# Patient Record
Sex: Male | Born: 1935 | Race: White | Hispanic: No | Marital: Married | Smoking: Former smoker
Health system: Southern US, Community
[De-identification: ages and names within clinical notes are randomized; demographics above are authoritative.]

## PROBLEM LIST (undated history)

## (undated) DIAGNOSIS — Z87442 Personal history of urinary calculi: Secondary | ICD-10-CM

## (undated) DIAGNOSIS — I499 Cardiac arrhythmia, unspecified: Secondary | ICD-10-CM

## (undated) DIAGNOSIS — M169 Osteoarthritis of hip, unspecified: Secondary | ICD-10-CM

## (undated) DIAGNOSIS — H3530 Unspecified macular degeneration: Secondary | ICD-10-CM

## (undated) DIAGNOSIS — I714 Abdominal aortic aneurysm, without rupture, unspecified: Secondary | ICD-10-CM

## (undated) DIAGNOSIS — I739 Peripheral vascular disease, unspecified: Secondary | ICD-10-CM

## (undated) DIAGNOSIS — K635 Polyp of colon: Secondary | ICD-10-CM

## (undated) DIAGNOSIS — I10 Essential (primary) hypertension: Secondary | ICD-10-CM

## (undated) DIAGNOSIS — I729 Aneurysm of unspecified site: Secondary | ICD-10-CM

## (undated) DIAGNOSIS — IMO0002 Reserved for concepts with insufficient information to code with codable children: Secondary | ICD-10-CM

## (undated) DIAGNOSIS — E785 Hyperlipidemia, unspecified: Secondary | ICD-10-CM

## (undated) DIAGNOSIS — M359 Systemic involvement of connective tissue, unspecified: Secondary | ICD-10-CM

## (undated) HISTORY — DX: Personal history of urinary calculi: Z87.442

## (undated) HISTORY — DX: Aneurysm of unspecified site: I72.9

## (undated) HISTORY — DX: Systemic involvement of connective tissue, unspecified: M35.9

## (undated) HISTORY — PX: ABDOMINAL AORTIC ANEURYSM REPAIR: SUR1152

## (undated) HISTORY — DX: Essential (primary) hypertension: I10

## (undated) HISTORY — DX: Unspecified macular degeneration: H35.30

## (undated) HISTORY — DX: Hyperlipidemia, unspecified: E78.5

## (undated) HISTORY — DX: Reserved for concepts with insufficient information to code with codable children: IMO0002

## (undated) HISTORY — DX: Osteoarthritis of hip, unspecified: M16.9

## (undated) HISTORY — PX: OTHER SURGICAL HISTORY: SHX169

## (undated) HISTORY — DX: Abdominal aortic aneurysm, without rupture, unspecified: I71.40

## (undated) HISTORY — DX: Abdominal aortic aneurysm, without rupture: I71.4

## (undated) HISTORY — DX: Polyp of colon: K63.5

---

## 1996-02-22 HISTORY — PX: HERNIA REPAIR: SHX51

## 2004-02-22 HISTORY — PX: DOPPLER ECHOCARDIOGRAPHY: SHX263

## 2004-10-13 ENCOUNTER — Ambulatory Visit: Payer: Self-pay | Admitting: Ophthalmology

## 2004-10-28 ENCOUNTER — Ambulatory Visit: Payer: Self-pay | Admitting: Gastroenterology

## 2005-02-09 ENCOUNTER — Ambulatory Visit: Payer: Self-pay | Admitting: Internal Medicine

## 2006-03-07 ENCOUNTER — Ambulatory Visit: Payer: Self-pay | Admitting: Internal Medicine

## 2008-04-24 ENCOUNTER — Ambulatory Visit: Payer: Self-pay | Admitting: Cardiology

## 2008-05-06 ENCOUNTER — Encounter: Payer: Self-pay | Admitting: Cardiology

## 2008-05-06 ENCOUNTER — Ambulatory Visit: Payer: Self-pay

## 2008-05-14 ENCOUNTER — Ambulatory Visit: Payer: Self-pay | Admitting: Cardiology

## 2008-05-14 ENCOUNTER — Encounter (INDEPENDENT_AMBULATORY_CARE_PROVIDER_SITE_OTHER): Payer: Self-pay | Admitting: *Deleted

## 2008-12-16 ENCOUNTER — Telehealth: Payer: Self-pay | Admitting: Cardiology

## 2009-02-23 ENCOUNTER — Telehealth: Payer: Self-pay | Admitting: Cardiology

## 2009-04-16 ENCOUNTER — Ambulatory Visit: Payer: Self-pay | Admitting: Ophthalmology

## 2009-04-29 ENCOUNTER — Ambulatory Visit: Payer: Self-pay | Admitting: Ophthalmology

## 2009-09-02 ENCOUNTER — Encounter: Payer: Self-pay | Admitting: Cardiovascular Disease

## 2009-09-03 ENCOUNTER — Ambulatory Visit: Payer: Self-pay | Admitting: Cardiovascular Disease

## 2009-09-03 DIAGNOSIS — E785 Hyperlipidemia, unspecified: Secondary | ICD-10-CM | POA: Insufficient documentation

## 2009-09-03 DIAGNOSIS — I4949 Other premature depolarization: Secondary | ICD-10-CM

## 2009-10-23 ENCOUNTER — Telehealth: Payer: Self-pay | Admitting: Cardiovascular Disease

## 2010-01-28 ENCOUNTER — Ambulatory Visit: Payer: Self-pay | Admitting: Unknown Physician Specialty

## 2010-01-29 LAB — PATHOLOGY REPORT

## 2010-03-23 NOTE — Assessment & Plan Note (Signed)
Summary: ROV  Medications Added BYSTOLIC 5 MG TABS (NEBIVOLOL HCL) 1 tab once daily BYSTOLIC 5 MG TABS (NEBIVOLOL HCL) 1/2 tab once daily METOPROLOL TARTRATE 25 MG TABS (METOPROLOL TARTRATE) Take 1/2 -1 tablet by mouth twice a day      Allergies Added: ! ZOCOR ! * IVP DYE  Visit Type:  rov Primary Provider:  Einar Crow.[facility]  CC:  edema/ankles...no other complaints today.  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  he was on bystolic 2.5 mg daily no had some temporary relief with his PVCs but did develop ectopy again. Dr. Dareen Piano increased his medication 25 mg a day and he reports having some hypotension and dizziness particularly when bending over. One day when he was playing golf he reports having systolic pressures in the high 80s to 90s.  Holter monitor, which showed frequent PVCs of ventricular couplets and trigeminy, bigeminy.  There were no runs of nonsustained ventricular tachycardia, 7.9% of the beats were wide complex.   echocardiogram to ensure that his heart was structurally normal.  This showed normal LV systolic function, mild diastolic dysfunction, mild LVH, and no significant valvular disease. The pulmonary artery systolic pressure was mildly elevated at 40 mmHg.  We additionally did an exercise treadmill Myoview to see if frequent PVCs could be a manifestation of ischemia.  The patient did have excellent exercise tolerance, stopped due to fatigue with no chest pain.    Current Medications (verified): 1)  Lipitor 40 Mg Tabs (Atorvastatin Calcium) .Marland Kitchen.. 1 By Mouth Daily 2)  Icaps Areds Formula  Tabs (Multiple Vitamins-Minerals) .Marland Kitchen.. 1 By Mouth Twice Daily 3)  Aspirin 81 Mg Tbec (Aspirin) .... Take One Tablet By Mouth Daily 4)  Centrum  Tabs (Multiple Vitamins-Minerals) .Marland Kitchen.. 1 By Mouth Daily 5)   Bystolic 5 Mg Tabs (Nebivolol Hcl) .Marland Kitchen.. 1 Tab Once Daily  Allergies (verified): 1)  ! Zocor 2)  ! * Ivp Dye  Past History:  Past Surgical History: Last updated: 05/14/2008 AAA Lung Hamartoma 2 Hernia's  Review of Systems  The patient denies fever, weight loss, weight gain, vision loss, decreased hearing, hoarseness, chest pain, syncope, dyspnea on exertion, peripheral edema, prolonged cough, abdominal pain, incontinence, muscle weakness, depression, and enlarged lymph nodes.         Dizzy, hypotension on medication  Vital Signs:  Patient profile:   77 year old male Height:      68 inches Weight:      163 pounds BMI:     24.87 Pulse rate:   57 / minute Pulse rhythm:   regular BP sitting:   144 / 82  (left arm) Cuff size:   large  Vitals Entered By: Danielle Rankin, CMA (September 03, 2009 10:57 AM)  Physical Exam  General:  Well developed, well nourished, in no acute distress. Head:  normocephalic and atraumatic Neck:  Neck supple, no JVD. No masses, thyromegaly or abnormal cervical nodes. Lungs:  Clear bilaterally to auscultation and percussion. Heart:  Non-displaced PMI, chest non-tender; regular rate and rhythm, S1, S2 without murmurs, rubs or gallops. Carotid upstroke normal, no bruit.  Pedals normal pulses. No edema, no varicosities. Abdomen:  Bowel sounds positive; abdomen soft and non-tender without masses Msk:  Back normal, normal gait. Muscle strength and tone normal. Pulses:  pulses normal in all 4 extremities Extremities:  No clubbing or cyanosis. Neurologic:  Alert and oriented x 3. Skin:  Intact without lesions or rashes. Psych:  Normal affect.   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 He had hypotension on bystolic 5 mg daily. We have suggested he could take beta blockers p.r.n. for his symptoms. He can also try metoprolol tartrate 12.5 mg b.i.d. p.r.n. for symptoms, Instead of bystolic 5 mg  daily.  He does not have any Raynaud's symptoms during the summertime and now may be a good time to try other beta blockers p.r.n.  His updated medication list for this problem includes:    Aspirin 81 Mg Tbec (Aspirin) .Marland Kitchen... Take one tablet by mouth daily    Bystolic 5 Mg Tabs (Nebivolol hcl) .Marland Kitchen... 1/2 tab once daily    Metoprolol Tartrate 25 Mg Tabs (Metoprolol tartrate) .Marland Kitchen... Take 1/2 -1 tablet by mouth twice a day  Problem # 2:  HYPERLIPIDEMIA-MIXED (ICD-272.4) he is on aspirin and Lipitor. He reports having vision problems in his eye in the past thought secondary to plaque embolism. Cholesterol is managed by Dr. Einar Crow.  His updated medication list for this problem includes:    Lipitor 40 Mg Tabs (Atorvastatin calcium) .Marland Kitchen... 1 by mouth daily  Patient Instructions: 1)  Your physician recommends that you schedule a follow-up appointment as needed  2)  Your physician has recommended you make the following change in your medication: decrease Bystolic to 1/2 tab daily and start Metoprolol 25mg  1/2 -1 tablet two times a day  Prescriptions: METOPROLOL TARTRATE 25 MG TABS (METOPROLOL TARTRATE) Take 1/2 -1 tablet by mouth twice a day  #60 x 6   Entered by:   Hardin Negus, RMA   Authorized by:   Dossie Arbour MD   Signed by:   Hardin Negus, RMA on 09/03/2009   Method used:   Electronically to        [facility] [phone]* (retail)       [address] [place], [place]  [address]       Ph: [phone] or [phone]       Fax: [phone]   RxID:   [id number]

## 2010-03-23 NOTE — Progress Notes (Signed)
Summary: MEDICATION PROBLEMS   Phone Note Call from Patient Call back at Home Phone [phone] Call back at cell [phone]   Caller: SELF Call For: GOLLAN Summary of Call: BP IS RUNNING TOO LOW-LOTS OF DIZZINESS, ESPECIALLY WHEN PLAYING GOLF-PT WAS STARTED ON BYSTOLLIC 1/2 OF A TABLET PER DAY-PT IS TAKING METOPROLOL 25MG  1/2 TABLET 2X A DAY  Initial call taken by: Harlon Flor,  October 23, 2009 3:27 PM  Follow-up for Phone Call        Pt reports being dizzy bp's 11/67,91/51,89/52, 83/50, 103/54, 98/51, 102/48. Taking metoprolol 12.5 two times a day,  bystolic 2.5mg  daily.  Follow-up by: Benedict Needy, RN,  October 23, 2009 3:43 PM  Additional Follow-up for Phone Call Additional follow up Details #1::        Per Dr. Gala Romney stop bystolic

## 2010-03-23 NOTE — Progress Notes (Signed)
Summary: RX  Medications Added BYSTOLIC 2.5 MG TABS (NEBIVOLOL HCL) 1 by mouth once daily       Phone Note Refill Request Call back at Home Phone [phone] Message from:  SELF on February 23, 2009 9:56 AM  [facility]STOLIC 2.[facility] [address]  Initial call taken by: Harlon Flor,  February 23, 2009 9:57 AM    New/Updated Medications: BYSTOLIC 2.5 MG TABS (NEBIVOLOL HCL) 1 by mouth once daily Prescriptions: BYSTOLIC 2.5 MG TABS (NEBIVOLOL HCL) 1 by mouth once daily  #30 x 6   Entered by:   Mercer Pod   Authorized by:   Marca Ancona, MD   Signed by:   Mercer Pod on 02/23/2009   Method used:   [facility]ronically to        [facility] [phone][facility] (retail)       [address] [place], [place]  [address]       Ph: [phone] or [phone]       Fax: [phone]   RxID:   [id number]

## 2010-03-23 NOTE — Letter (Signed)
Summary: Historic Patient File  Historic Patient File   Imported By: [place] Carbo 09/03/2009 14:27:37  _____________________________________________________________________  External Attachment:    Type:   Image     Comment:   External Document

## 2010-07-06 NOTE — Assessment & Plan Note (Signed)
[facility] OFFICE NOTE   Jerry [name], Jerry [name]                       MRN:          [id number]  DATE:05/14/2008                            DOB:          28-Dec-1928    PRIMARY CARE PHYSICIAN:  Dr. Einar Crow at [facility].   RHEUMATOLOGIST:  Dr. Lavenia Atlas at [facility].   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  The  patient states that for 3 weeks prior to his initial appointment, he had  felt significant fluttering and palpitations in his chest.  He noticed  these most often when he is at rest and they tend to resolve with  activity.  He had had no episodes of lightheadedness or syncope.  He had  a Holter monitor, which showed frequent PVCs of ventricular couplets and  trigeminy, bigeminy.  There were no runs of nonsustained ventricular  tachycardia, 7.9% of the beats were wide complex.  Given the frequent  ventricular ectopy, we did do an echocardiogram to ensure that his heart  was structurally normal.  This showed normal LV systolic function, mild  diastolic dysfunction, mild LVH, and no significant valvular disease.  The pulmonary artery systolic pressure was mildly elevated at 40 mmHg.  We additionally did an exercise treadmill Myoview to see if frequent  PVCs could be a manifestation of ischemia.  The patient did have  excellent exercise tolerance, stopped due to fatigue with no chest pain.  He had occasional PVCs that were more frequent in recovery.  He actually  also had an episode of transient ectopic atrial tachycardia while on the  treadmill.  This was very brief.  The stress test itself was negative  with no evidence of ischemia or infarction.   Since last time I saw him,  the patient does state that his palpitations seem to have decreased in  frequency.  He says he has been less anxious about the palpitations, and  that may have helped.  He has not been drinking any caffeine.  His heart  rate on exam today is regular.   PAST MEDICAL HISTORY:  1. Hyperlipidemia.  2. Hypertension.  3. Paget disease.  4. History of nephrolithiasis.  5. Osteoarthritis of left hip.  6. Colonic polyps.  7. Collagen vascular disorder, which does sound like a mixed      connective tissue disease.  This is manifested by elevated sed      rate, pleuritis, Raynaud phenomenon, and he does have a positive      rheumatoid factor.  He is being treated with hydroxychloroquine for      his collagen vascular disease.  8. Macular degeneration.  9. History of multiple vascular aneurysms.  These were of the aorta,      the iliac arteries, and popliteal arteries.  The patient is status      post stent graft with an abdominal aortic  aneurysm.  All of his      aneurysm followup has been done at [facility].  10.Hernia repair in 1998.  11.Echocardiogram done in March 2010, EF 55-60%, no regional wall      motion abnormalities, mild LVH, mild diastolic dysfunction.  The      aortic valve was calcified, but there is no aortic stenosis or      aortic insufficiency, mild left atrial enlargement, mild MR, mild      TR, pulmonary systolic pressure 40 mmHg.  12.Exercise treadmill Myoview.  The patient did exercise for 10      minutes and 10 seconds.  He stopped due to fatigue.  There is no      chest pain.  There were occasional PVCs that were more frequent in      recovery.  There was a brief episode of transient ectopic atrial      tachycardia.  There was no evidence for ischemia or infarction on      the perfusion images with an EF of 63%.  13.Holter monitor done at the [facility] March 2010 showed      frequent PVCs with ventricular couplets and trigeminy,  bigeminy.      There was no ventricular tachycardia, 7.9% of the beats were wide      complex.   Most recent labs we have available from September 2009, LDL was 93, HDL  51, creatinine 0.8.   MEDICATIONS:  1. Aspirin 81 mg daily.  2. Hydroxychloroquine.  3. Norvasc 2.5 mg b.i.d.  4. Lipitor 40 mg daily.   ALLERGIES:  CONTRAST DYE and ZOCOR, which gives the patient myalgias.   PHYSICAL EXAMINATION:  VITAL SIGNS:  Blood pressure is 142/82, heart  rate 70 and regular.  GENERAL:  This is a well-developed male in no apparent distress.  NEUROLOGIC:  Alert and oriented x3.  Normal affect.  LUNGS:  Clear to auscultation bilaterally with normal respiratory  effort.  CARDIOVASCULAR:  Heart regular.  S1 and S2.  No S3.  There is a soft S4.  There is no murmur.  There is no peripheral edema.  There are 2+  posterior tibial pulses bilaterally.  There is no carotid bruit.  I did  detect any premature beats today on exam.  ABDOMEN:  Soft, nontender.  No hepatosplenomegaly.  Normal bowel sounds.  EXTREMITIES:  No clubbing or cyanosis.  NECK:  There is no JVD.  There is no thyromegaly or thyroid nodule.   ASSESSMENT/PLAN:  This is a 77 year old with a history of hypertension,  hyperlipidemia, and vascular aneurysms as well as probable mixed  connective tissue disease who presents to Cardiology Clinic for  evaluation of frequent premature ventricular contractions.  1. Premature ventricular contractions.  On this visit, the patient is      having significantly less frequent PVCs on exam.  His      echocardiogram showed an essentially structurally normal heart and      his Myoview showed no evidence for ischemia or infarction.  As      mentioned, his Holter monitor from earlier in the month showed      about 7.9% of his beats were ventricular in origin.  His ectopic      beats did have a left bundle-branch block, inferior axis morphology      suggesting origin around the RV outflow tract.  The  patient also      had a normal TSH per his report done at Dr. Ewell Poe office.  We      do not have any immediate explanation for the onset of his PVCs.      He has not been using any sympathomimetic drugs.  He is completely      off caffeine and he actually has no cardiopulmonary-type symptoms.      I think at this time we will continue to manage him conservatively      especially as his premature beats seem to become significantly less      frequent.  I did tell him that we would try him on nebivolol at 5      mg daily to see if this can help suppress his residual PVCs.  There      is some evidence that very frequent ventricular ectopy can lead to      development of LV systolic dysfunction and congestive heart      failure; however, I do not think that the patient's ectopic burden      of 7.9% would be enough to cause cardiomyopathy, so if he is unable      to tolerate nebivolol, I do not think it is an absolutely necessary      medication.  I did choose nebivolol because of its beta-1      selectivity at a dose of less than 10 mg/day.  Given the fact that      he has Raynauds syndrome, we do want to avoid beta-2 blocking      effect.  I did tell the patient that if he does note his Raynaud's      symptoms becoming worse then he should stop the nebivolol.  He is      on Norvasc 2.5 mg twice a day presumably for his Raynauds syndrome      as well.  2. Hypertension.  The patient's blood pressure is 142/82 today.  It      does continue to be elevated; however, he does bring me readings      from his blood pressure cuff at home, which have ranged from      systolic of 119-134 over diastolic 62-75.  He does say that the      blood pressure cuff has been calibrated at his wife's primary care      physician's office, so I do think that there may be a white coat      component to his hypertension.  As mentioned, we will add nebivolol      to his regimen.  This should help lower his blood  pressure a bit.  3. I will have the patient back in 6 months to see how he is doing on      medication and additionally, given the calcification of his aortic      valve with a mild restriction of valve opening, it probably will be      a good idea to get an echocardiogram in about 2 years.     Marca Ancona, MD  Electronically Signed    DM/MedQ  DD: 05/14/2008  DT: 05/14/2008  Job #: [id number]   cc:   Einar Crow, MD

## 2010-07-06 NOTE — Assessment & Plan Note (Signed)
[facility] OFFICE NOTE   JAKOB, KIMBERLIN                       MRN:          [id number]  DATE:04/24/2008                            DOB:          1929/02/14    PRIMARY CARE PHYSICIAN:  Dr.  Marya Amsler. [place].   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  The patient states that for the last 3 weeks he has  felt fluttering and palpitations in his chest.  He says he notes these  the most when he is at rest, especially when he is lying down in bed at  night.  When he is more active, he does not notice them as much.  He has  had no episodes of lightheadedness or syncope.  He was seen by Dr.  Dareen Piano who noted frequent PVCs on EKG.  Holter monitor was ordered.  The Holter monitor shows frequent PVCs with ventricular couplets and  trigeminy and bigeminy.  There were no runs of ventricular tachycardia  or nonsustained ventricular tachycardia.  7.9% of the beats recorded  were wide complex.  The patient states that prior to 3 weeks ago he  really had not been having these symptoms.  He used to drink a cup or  two of coffee a day; however, for the last week, he has been drinking no  caffeine at all.  He has not been using any over-the-counter cold  remedies.  He does have baseline excellent exercise tolerance.  He walks  for exercise.  He plays golf and will walk 18 holes.  He gets no  shortness of breath with these activities.  He does get mildly short of  breath if he climbs up a hill.  He can climb a flight of steps without  problems.  The patient does not ever get episodes of chest pain or  tightness.  He has no known cardiac history.   PAST MEDICAL HISTORY:  1. Hyperlipidemia.  2. Hypertension.  3. Paget's disease.  4. History of nephrolithiasis.  5. Osteoarthritis in the left hip.  6. Colonic polyps.  7. Collagen vascular disorder, which does sound like a mixed      connective tissue disease.  This is manifested and is elevated, sed      rate, pleuritis, Raynaud's phenomenon and she does have a positive      rheumatoid factor.  He is being treated with Plaquenil for his      collagen vascular disease.  8. Macular degeneration.  9. History of multiple vascular aneurysms.  These involve the aorta,      the iliac arteries, and the popliteal arteries.  The patient is      status post stent graft of an abdominal aortic aneurysm.  All of      his aneurysm followup has been done at [facility].  10.Hernia repair in 1998.  11.Echocardiogram in 2006, showed EF greater than 60%.  There are  no      regional wall motion abnormalities.  There was mild mitral      regurgitation, there is no aortic stenosis, aortic valve was mildly      calcified.   SOCIAL HISTORY:  The patient quit smoking more than 40 years ago.  He is  retired, married, lives in [place].  He has three children and  grandchildren, rarely drinks alcohol.   FAMILY HISTORY:  Mother died of an MI at the age of 41, father had  Alzheimer's disease.   REVIEW OF SYSTEMS:  Negative except as noted in the history of present  illness.   EKG shows normal sinus rhythm with ventricular bigeminy.  The  ventricular beats have a left bundle/inferior axis morphology suggesting  origin from the area of the RV outflow tract.   MEDICATIONS:  1. Aspirin 81 mg daily.  2. Plaquenil 200 mg b.i.d.  3. Lipitor 40 mg daily.  4. Meloxicam p.r.n.  5. Norvasc 2.5 mg b.i.d.   ALLERGIES:  CONTRAST DYE and ZOCOR, which gives the patient myalgias.   PHYSICAL EXAMINATION:  VITAL SIGNS:  Blood pressure is 161/76, heart  rate is 87 and regular.  Weight is 163 pounds.  GENERAL:  This is a well-developed elderly male in no apparent distress.  NEUROLOGIC:  Alert and  oriented x3.  Normal affect.  LUNGS:  Clear to auscultation bilaterally.  Normal respiratory effort.  CARDIOVASCULAR:  Heart regular.  S1 and S2.  No S3.  There was a soft  S4.  There is no murmur.  There is no peripheral edema.  There are 2+  posterior tibial pulses bilaterally.  There is no carotid bruit.  ABDOMEN:  Soft, nontender.  No hepatosplenomegaly.  Normal bowel sounds.  EXTREMITIES:  No clubbing, cyanosis.  SKIN:  Normal exam.  MUSCULOSKELETAL:  Normal exam.  HEENT:  Normal exam.  NECK:  There is no JVD.  There is no thyromegaly or thyroid nodule.   ASSESSMENT/PLAN:  This is a 77 year old with history of hypertension,  hyperlipidemia, and cardiovascular disease who presents to the  Cardiology Clinic for evaluation of very frequent premature ventricular  contractions.  1. Premature ventricular contractions.  The patient initial EKG upon      arrival in our office showed ventricular bigeminy.  Interestingly,      when I examined him about 15 minutes later, he was no longer having      ventricular premature beats and his heart rhythm was actually      regular.  His Holter monitor was reviewed, this showed no runs of      ventricular tachycardia or nonsustained ventricular tachycardia but      did show ventricular bigeminy and trigeminy.  Wide complex beats      composed about 7.9% of the recorded beats.  His ectopic ventricular      beats do have a left bundle-branch block, inferior axis morphology      suggesting origin from the area of the RV outflow tract.  The      patient does not have any immediate explanation for the onset of      these PVCs.  The patient has not been using any sympathomimetic      drugs.  Prior to this, he really has had almost no cardiopulmonary      type symptoms.  I do think that for further workup we will need to      obtain an echocardiogram to assess the structure and function of  the heart.  I would also suggest that we do an exercise  treadmill      Myoview because the PVCs could be a manifestation of ischemia and      because I will be interested to see if the PVCs change in frequency      with exercise on the treadmill.  We will obtain his labs from the      [facility] to make sure that he is not hypokalemic or      hypomagnesemic.  If the patient's echo and stress test come back      benign, I would consider use of a beta-blocker to attempt      suppression of his PVCs.  There is some evidence that very frequent      ventricular ectopy can lead to the development of left ventricular      systolic dysfunction and congestive heart failure.  However, I do      not think that the patient's ectopic burden of 7.9% of the total      beats would be enough to cause a cardiomyopathy.  Of note, if we do      use a beta-blocker to suppress his ventricular ectopy, we will need      to consider the fact that he has Raynaud syndrome and a beta      blocker could worsen this.  We should probably increase his      Norvasc, therefore, if we start a beta blocker.  2. Hypertension.  The patient's blood pressure is elevated today,      161/76, and was elevated 150/68 at his primary care physician's      appointment.  I did mention to the patient that it would probably      be a good idea to increase his blood pressure medicines to improve      control, but he wants to hold off on this for now.  He says he is      going to check his blood pressure on his home cuff and record it      for [place] to go over the next time he comes back in.  After his workup      of PVCs, if we do indeed begin on a beta-blocker, given his history      of Raynaud's, I would probably plan on titrating up his Norvasc to      5 mg twice a day.     Marca Ancona, MD  Electronically Signed    DM/MedQ  DD: 04/24/2008  DT: 04/25/2008  Job #: [id number]   cc:   Einar Crow

## 2010-07-30 ENCOUNTER — Encounter: Payer: Self-pay | Admitting: Cardiovascular Disease

## 2010-09-29 ENCOUNTER — Telehealth: Payer: Self-pay

## 2010-09-29 MED ORDER — METOPROLOL TARTRATE 25 MG PO TABS: 25.0000 mg | ORAL_TABLET | Freq: Two times a day (BID) | ORAL | Status: DC

## 2010-09-29 NOTE — Telephone Encounter (Signed)
Needs a refill for metoprolol tart 25 mg take one tablet twice a day.

## 2011-11-27 ENCOUNTER — Other Ambulatory Visit: Payer: Self-pay | Admitting: Cardiovascular Disease

## 2012-08-01 DIAGNOSIS — N200 Calculus of kidney: Secondary | ICD-10-CM | POA: Insufficient documentation

## 2012-08-01 DIAGNOSIS — Z87891 Personal history of nicotine dependence: Secondary | ICD-10-CM | POA: Insufficient documentation

## 2012-08-03 DIAGNOSIS — I724 Aneurysm of artery of lower extremity: Secondary | ICD-10-CM | POA: Insufficient documentation

## 2014-05-12 DIAGNOSIS — I10 Essential (primary) hypertension: Secondary | ICD-10-CM | POA: Insufficient documentation

## 2016-02-05 DIAGNOSIS — Z8739 Personal history of other diseases of the musculoskeletal system and connective tissue: Secondary | ICD-10-CM | POA: Insufficient documentation

## 2016-05-18 DIAGNOSIS — Z0000 Encounter for general adult medical examination without abnormal findings: Secondary | ICD-10-CM | POA: Insufficient documentation

## 2017-05-03 ENCOUNTER — Emergency Department
Admission: EM | Admit: 2017-05-03 | Discharge: 2017-05-04 | Disposition: A | Discharge status: Home / Self Care | Payer: Medicare Other | Attending: Emergency Medicine | Admitting: Emergency Medicine

## 2017-05-03 ENCOUNTER — Emergency Department: Payer: Medicare Other

## 2017-05-03 DIAGNOSIS — Z7982 Long term (current) use of aspirin: Secondary | ICD-10-CM | POA: Diagnosis not present

## 2017-05-03 DIAGNOSIS — Z8679 Personal history of other diseases of the circulatory system: Secondary | ICD-10-CM | POA: Insufficient documentation

## 2017-05-03 DIAGNOSIS — Z87891 Personal history of nicotine dependence: Secondary | ICD-10-CM | POA: Insufficient documentation

## 2017-05-03 DIAGNOSIS — I10 Essential (primary) hypertension: Secondary | ICD-10-CM | POA: Diagnosis not present

## 2017-05-03 DIAGNOSIS — Z79899 Other long term (current) drug therapy: Secondary | ICD-10-CM | POA: Diagnosis not present

## 2017-05-03 DIAGNOSIS — R066 Hiccough: Secondary | ICD-10-CM | POA: Diagnosis present

## 2017-05-03 LAB — CBC WITH DIFFERENTIAL/PLATELET
BASOS ABS: 0 10*3/uL (ref 0–0.1)
BASOS PCT: 0 %
EOS ABS: 0.1 10*3/uL (ref 0–0.7)
EOS PCT: 1 %
HCT: 46.1 % (ref 40.0–52.0)
Hemoglobin: 15.3 g/dL (ref 13.0–18.0)
LYMPHS PCT: 7 %
Lymphs Abs: 0.6 10*3/uL — ABNORMAL LOW (ref 1.0–3.6)
MCH: 30.8 pg (ref 26.0–34.0)
MCHC: 33.1 g/dL (ref 32.0–36.0)
MCV: 92.9 fL (ref 80.0–100.0)
Monocytes Absolute: 0.9 10*3/uL (ref 0.2–1.0)
Monocytes Relative: 11 %
Neutro Abs: 6.9 10*3/uL — ABNORMAL HIGH (ref 1.4–6.5)
Neutrophils Relative %: 81 %
PLATELETS: 200 10*3/uL (ref 150–440)
RBC: 4.96 MIL/uL (ref 4.40–5.90)
RDW: 12.7 % (ref 11.5–14.5)
WBC: 8.4 10*3/uL (ref 3.8–10.6)

## 2017-05-03 LAB — COMPREHENSIVE METABOLIC PANEL
ALT: 20 U/L (ref 17–63)
AST: 33 U/L (ref 15–41)
Albumin: 3.6 g/dL (ref 3.5–5.0)
Alkaline Phosphatase: 93 U/L (ref 38–126)
Anion gap: 10 (ref 5–15)
BUN: 23 mg/dL — AB (ref 6–20)
CHLORIDE: 101 mmol/L (ref 101–111)
CO2: 26 mmol/L (ref 22–32)
CREATININE: 0.96 mg/dL (ref 0.61–1.24)
Calcium: 8.1 mg/dL — ABNORMAL LOW (ref 8.9–10.3)
GFR calc Af Amer: >60 mL/min (ref >60)
GFR calc non Af Amer: >60 mL/min (ref >60)
Glucose, Bld: 141 mg/dL — ABNORMAL HIGH (ref 65–99)
Potassium: 3.6 mmol/L (ref 3.5–5.1)
SODIUM: 137 mmol/L (ref 135–145)
Total Bilirubin: 0.8 mg/dL (ref 0.3–1.2)
Total Protein: 6.8 g/dL (ref 6.5–8.1)

## 2017-05-03 LAB — TROPONIN I: Troponin I: <0.03 ng/mL (ref <0.03)

## 2017-05-03 NOTE — ED Notes (Signed)
Pt sitting in lobby with no distress noted, no c/o pain; hiccups persist; vs retaken and updated on wait time

## 2017-05-03 NOTE — ED Triage Notes (Signed)
Patient reports he was diagnosed influenza [facility]. Patient was prescribed and is taking tamiflu.  Patient c/o hiccups.

## 2017-05-03 NOTE — ED Notes (Signed)
Patient reports medial/left chest discomfort, however, reports he believes it's due to hiccups

## 2017-05-04 DIAGNOSIS — R066 Hiccough: Secondary | ICD-10-CM | POA: Diagnosis not present

## 2017-05-04 LAB — INFLUENZA PANEL BY PCR (TYPE A & B)
Influenza A By PCR: NEGATIVE
Influenza B By PCR: NEGATIVE

## 2017-05-04 MED ORDER — CHLORPROMAZINE HCL 25 MG PO TABS
25.0000 mg | ORAL_TABLET | Freq: Once | ORAL | Status: AC
  Administered 2017-05-04: 25 mg via ORAL
  Filled 2017-05-04: qty 1

## 2017-05-04 MED ORDER — AZITHROMYCIN 500 MG PO TABS: 500.0000 mg | ORAL_TABLET | Freq: Every day | ORAL | 0 refills | Status: AC

## 2017-05-04 MED ORDER — CHLORPROMAZINE HCL 25 MG PO TABS: 25.0000 mg | ORAL_TABLET | Freq: Three times a day (TID) | ORAL | 0 refills | Status: DC | PRN

## 2017-05-04 NOTE — ED Notes (Signed)
Patient has been hiccupping since yesterday morning so over 40 hours.

## 2017-05-04 NOTE — ED Provider Notes (Signed)
[name] Emergency Department Provider Note __   First MD Initiated Contact with Patient 05/03/17 2350     (approximate)  I have reviewed the triage vital signs and the nursing notes.   HISTORY  Chief Complaint Hiccups   HPI Jerry [name] is a 84 y.o. male with below list of chronic medical conditions sent to the emergency department secondary to persistent hiccups yesterday morning.  Patient states that [facility] and diagnosed with the flu.  In addition patient admits to recent trip and fall with right chest wall injury.  Patient admits to subjective fevers and chills at home.     Past Medical History:  Diagnosis Date  . AAA (abdominal aortic aneurysm) (HCC)   . Aneurysm (HCC)    History of multiple vascular aneurysms. [name] involve the aorta, te iliac arteries, and the popliteal arteries.  The pt is s/p stent graft of an abdominal aortic aneurysm. All of his aneurysm [facility] up as [facility].  . 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 This is manifested and is elevated, sed rate, pleuritis, Raynaud's pehnomenon and she does have a positive rheumatoid factor.  He is being treated with Plquenil for his collagen vascular disease  . Colonic polyp   . History of nephrolithiasis   . Hyperlipidemia   . Hypertension   . Macular degeneration   . Osteoarthrosis, hip    Left hip  . Paget's disease     Patient Active Problem List   Diagnosis Date Noted  . HYPERLIPIDEMIA-MIXED 09/03/2009  . PREMATURE VENTRICULAR CONTRACTIONS 09/03/2009    Past Surgical History:  Procedure Laterality Date  . ABDOMINAL AORTIC ANEURYSM REPAIR    . DOPPLER ECHOCARDIOGRAPHY  2006   Ef greater than 60%. There are no regional wall motion abnormalities. There was mild mitral regurgitation, there is no aortic stenosis, aortic valve was mildly calcified  . HERNIA REPAIR  1998  . Lung  Hamartoma      Prior to Admission medications   Medication Sig Start Date End Date Taking? Authorizing Provider  aspirin 81 MG EC tablet Take 81 mg by mouth daily.      [provider]  atorvastatin (LIPITOR) 40 MG tablet Take 40 mg by mouth daily.      [provider]  metoprolol tartrate (LOPRESSOR) 25 MG tablet Take 1 tablet (25 mg total) by mouth 2 (two) times daily. 09/29/10   Antonieta IbaGollan, Timothy J, MD  Multiple Vitamins-Minerals (CENTRUM) tablet Take 1 tablet by mouth daily.      [provider]  Multiple Vitamins-Minerals (ICAPS) TABS Take 1 tablet by mouth 2 (two) times daily.      [provider]    Allergies Simvastatin  Family History  Problem Relation Age of Onset  . Heart attack Mother [id number]       MI  . Alzheimer's disease Father     Social History Social History   Tobacco Use  . Smoking status: Former Games developermoker  . Tobacco comment: Quit smoking more than 40 years ago  Substance Use Topics  . Alcohol use: Yes    Comment: Rarely  . Drug use: Not on file    Review of Systems Constitutional: No fever/chills Eyes: No visual changes. ENT: No sore throat. Cardiovascular: Positive for chest pain. Respiratory: Denies shortness of breath. Gastrointestinal: No abdominal pain.  No nausea, no vomiting.  No diarrhea.  No constipation. Genitourinary: Negative for dysuria. Musculoskeletal: Negative for neck pain.  Negative for back pain. Integumentary: Negative for rash. Neurological: Negative for headaches, focal weakness or numbness.   ____________________________________________   PHYSICAL EXAM:  VITAL SIGNS: ED Triage Vitals  Enc Vitals Group     BP 05/03/17 1959 (!) 190/77     Pulse Rate 05/03/17 1959 96     Resp 05/03/17 1959 18     Temp 05/03/17 1959 98.2 F (36.8 C)     Temp Source 05/03/17 1959 Oral     SpO2 05/03/17 1959 99 %     Weight 05/03/17 1959 73.5 kg (162 lb)     Height 05/03/17 1959 1.727 m (5\' 8" )     Head  Circumference --      Peak Flow --      Pain Score 05/03/17 2006 5     Pain Loc --      Pain Edu? --      Excl. in [place]? --     Constitutional: Alert and oriented. Well appearing and in no acute distress.  Actively having hiccups Eyes: Conjunctivae are normal.  Head: Atraumatic. Mouth/Throat: Mucous membranes are moist. Oropharynx non-erythematous. Neck: No stridor.   Cardiovascular: Normal rate, regular rhythm. Good peripheral circulation. Grossly normal heart sounds. Respiratory: Normal respiratory effort.  No retractions. Lungs CTAB. Gastrointestinal: Soft and nontender. No distention.  Musculoskeletal: No lower extremity tenderness nor edema. No gross deformities of extremities. Neurologic:  Normal speech and language. No gross focal neurologic deficits are appreciated.  Skin:  Skin is warm, dry and intact. No rash noted. Psychiatric: Mood and affect are normal. Speech and behavior are normal.  ____________________________________________   LABS (all labs ordered are listed, but only abnormal results are displayed)  Labs Reviewed  CBC WITH DIFFERENTIAL/PLATELET - Abnormal; Notable for the following components:      Result Value   Neutro Abs 6.9 (*)    Lymphs Abs 0.6 (*)    All other components within normal limits  COMPREHENSIVE METABOLIC PANEL - Abnormal; Notable for the following components:   Glucose, Bld 141 (*)    BUN 23 (*)    Calcium 8.1 (*)    All other components within normal limits  TROPONIN I  INFLUENZA PANEL BY PCR (TYPE A & B)   ____________________________________________  EKG  ED ECG REPORT I, Eaton N BROWN, the attending physician, personally viewed and interpreted this ECG.   Date: 05/04/2017  EKG Time: 8:18 PM  Rate: 92  Rhythm: Normal sinus rhythm  Axis: Normal  Intervals: Normal  ST&T Change: None  ____________________________________________  RADIOLOGY I, Dougherty N BROWN, personally viewed and evaluated these images (plain  radiographs) as part of my medical decision making, as well as reviewing the written report by the radiologist.   ED MD interpretation: Linear densities noted in the basilar lung fields  Official radiology report(s): Dg Chest 2 View  Result Date: 05/03/2017 CLINICAL DATA:  Left chest pain EXAM: CHEST - 2 VIEW COMPARISON:  Chest CT 03/07/2006 FINDINGS: Linear densities in both lung bases, scarring or atelectasis. Heart is normal size. No confluent airspace opacities or effusions. No acute bony abnormality. IMPRESSION: Linear densities in both lung bases, scarring or atelectasis. Electronically Signed   By: Charlett Nose M.D.   On: 05/03/2017 21:07     Procedures   ____________________________________________   INITIAL IMPRESSION / ASSESSMENT AND PLAN / ED COURSE  As part of my medical decision making, I reviewed the following data within  the electronic MEDICAL RECORD NUMBER   84 year old male presenting to the emergency department secondary to hiccups.  Concern for possible diaphragmatic irritation and as such chest x-ray was performed which revealed bilateral linear densities possibly atelectasis however given recent chest wall injury concern for evolving pneumonia and as such patient given azithromycin.  Regarding patient's pickup Thorazine was given with improvement.  Patient be prescribed Thorazine for home.    ____________________________________________  FINAL CLINICAL IMPRESSION(S) / ED DIAGNOSES  Final diagnoses:  Intractable hiccups     MEDICATIONS GIVEN DURING THIS VISIT:  Medications  chlorproMAZINE (THORAZINE) tablet 25 mg (not administered)     ED Discharge Orders    None       Note:  This document was prepared using Dragon voice recognition software and may include unintentional dictation errors.    Darci Current, MD 05/04/17 Earle Gell

## 2018-09-18 DIAGNOSIS — I7300 Raynaud's syndrome without gangrene: Secondary | ICD-10-CM | POA: Insufficient documentation

## 2018-09-18 DIAGNOSIS — H903 Sensorineural hearing loss, bilateral: Secondary | ICD-10-CM | POA: Insufficient documentation

## 2018-09-18 DIAGNOSIS — I714 Abdominal aortic aneurysm, without rupture, unspecified: Secondary | ICD-10-CM | POA: Insufficient documentation

## 2018-09-18 DIAGNOSIS — R7303 Prediabetes: Secondary | ICD-10-CM | POA: Insufficient documentation

## 2018-10-17 ENCOUNTER — Encounter: Payer: Self-pay | Admitting: Emergency Medicine

## 2018-10-17 ENCOUNTER — Other Ambulatory Visit: Payer: Self-pay

## 2018-10-17 DIAGNOSIS — I10 Essential (primary) hypertension: Secondary | ICD-10-CM | POA: Diagnosis not present

## 2018-10-17 DIAGNOSIS — Z7982 Long term (current) use of aspirin: Secondary | ICD-10-CM | POA: Insufficient documentation

## 2018-10-17 DIAGNOSIS — Z79899 Other long term (current) drug therapy: Secondary | ICD-10-CM | POA: Insufficient documentation

## 2018-10-17 DIAGNOSIS — R0789 Other chest pain: Secondary | ICD-10-CM | POA: Insufficient documentation

## 2018-10-17 DIAGNOSIS — Z87891 Personal history of nicotine dependence: Secondary | ICD-10-CM | POA: Insufficient documentation

## 2018-10-17 NOTE — ED Triage Notes (Signed)
Patient ambulatory to triage with steady gait, without difficulty or distress noted, mask in place; pt reports last 2hrs having "shock-like" pain to rt lower chest radiating into rt arm; denies any accomp symptoms, denies hx of same

## 2018-10-18 ENCOUNTER — Emergency Department
Admission: EM | Admit: 2018-10-18 | Discharge: 2018-10-18 | Disposition: A | Discharge status: Home / Self Care | Payer: Medicare Other | Attending: Emergency Medicine | Admitting: Emergency Medicine

## 2018-10-18 ENCOUNTER — Emergency Department: Payer: Medicare Other

## 2018-10-18 DIAGNOSIS — R0789 Other chest pain: Secondary | ICD-10-CM | POA: Diagnosis not present

## 2018-10-18 DIAGNOSIS — R079 Chest pain, unspecified: Secondary | ICD-10-CM

## 2018-10-18 LAB — COMPREHENSIVE METABOLIC PANEL
ALT: 19 U/L (ref 0–44)
AST: 22 U/L (ref 15–41)
Albumin: 4.2 g/dL (ref 3.5–5.0)
Alkaline Phosphatase: 105 U/L (ref 38–126)
Anion gap: 7 (ref 5–15)
BUN: 20 mg/dL (ref 8–23)
CO2: 26 mmol/L (ref 22–32)
Calcium: 9.7 mg/dL (ref 8.9–10.3)
Chloride: 108 mmol/L (ref 98–111)
Creatinine, Ser: 1.02 mg/dL (ref 0.61–1.24)
GFR calc Af Amer: >60 mL/min (ref >60)
GFR calc non Af Amer: >60 mL/min (ref >60)
Glucose, Bld: 113 mg/dL — ABNORMAL HIGH (ref 70–99)
Potassium: 4.6 mmol/L (ref 3.5–5.1)
Sodium: 141 mmol/L (ref 135–145)
Total Bilirubin: 0.5 mg/dL (ref 0.3–1.2)
Total Protein: 7 g/dL (ref 6.5–8.1)

## 2018-10-18 LAB — CBC WITH DIFFERENTIAL/PLATELET
Abs Immature Granulocytes: 0.02 10*3/uL (ref 0.00–0.07)
Basophils Absolute: 0 10*3/uL (ref 0.0–0.1)
Basophils Relative: 1 %
Eosinophils Absolute: 0.2 10*3/uL (ref 0.0–0.5)
Eosinophils Relative: 4 %
HCT: 44.7 % (ref 39.0–52.0)
Hemoglobin: 14.6 g/dL (ref 13.0–17.0)
Immature Granulocytes: 0 %
Lymphocytes Relative: 21 %
Lymphs Abs: 1.3 10*3/uL (ref 0.7–4.0)
MCH: 31.4 pg (ref 26.0–34.0)
MCHC: 32.7 g/dL (ref 30.0–36.0)
MCV: 96.1 fL (ref 80.0–100.0)
Monocytes Absolute: 1.1 10*3/uL — ABNORMAL HIGH (ref 0.1–1.0)
Monocytes Relative: 17 %
Neutro Abs: 3.6 10*3/uL (ref 1.7–7.7)
Neutrophils Relative %: 57 %
Platelets: 254 10*3/uL (ref 150–400)
RBC: 4.65 MIL/uL (ref 4.22–5.81)
RDW: 12.9 % (ref 11.5–15.5)
WBC: 6.2 10*3/uL (ref 4.0–10.5)
nRBC: 0 % (ref 0.0–0.2)

## 2018-10-18 LAB — TROPONIN I (HIGH SENSITIVITY)
Troponin I (High Sensitivity): 4 ng/L (ref <18)
Troponin I (High Sensitivity): 4 ng/L (ref <18)

## 2018-10-18 NOTE — Discharge Instructions (Signed)

## 2018-10-18 NOTE — ED Provider Notes (Signed)
[facility] Emergency Department Provider Note  ____________________________________________   First MD Initiated Contact with Patient 10/18/18 0122     (approximate)  I have reviewed the triage vital signs and the nursing notes.   HISTORY  Chief Complaint Chest Pain    HPI Jerry [name] is a 84 y.o. male with medical history as listed below which notably includes a history of abdominal aortic aneurysm status post surgery with grafts of these twice.  He presents tonight for evaluation of acute onset right-sided chest pain.  He says that it occurred while he was watching TV and it feels like electric shock.  Sometimes it radiates into his right arm.  Nothing particular makes it happen, makes it better, or makes it worse.  He takes daily baby aspirin but did not take any additional medicines after this started.  He does not usually have chest pain and has no history of cardiac disease so it concerned him.  However it was not accompanied by any other symptoms.  He denies shortness of breath, diaphoresis, nausea, vomiting, abdominal pain.  He has had no numbness nor tingling in his arms or his legs.  No difficulty with ambulation.  No recent contact with COVID-19 patients.  He reports the symptoms are mild to moderate and currently is chest pain-free.         Past Medical History:  Diagnosis Date  . AAA (abdominal aortic aneurysm) (HCC)   . Aneurysm (HCC)    History of multiple vascular aneurysms. [name] involve the aorta, te iliac arteries, and the popliteal arteries.  The pt is s/p stent graft of an abdominal aortic aneurysm. All of his aneurysm follow up as been done at [facility].  . 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 This is manifested and is elevated, sed rate, pleuritis, Raynaud's pehnomenon and she does have a positive rheumatoid factor.  He is being treated with Plquenil for his collagen vascular disease  .  Colonic polyp   . History of nephrolithiasis   . Hyperlipidemia   . Hypertension   . Macular degeneration   . Osteoarthrosis, hip    Left hip  . Paget's disease     Patient Active Problem List   Diagnosis Date Noted  . HYPERLIPIDEMIA-MIXED 09/03/2009  . PREMATURE VENTRICULAR CONTRACTIONS 09/03/2009    Past Surgical History:  Procedure Laterality Date  . ABDOMINAL AORTIC ANEURYSM REPAIR    . DOPPLER ECHOCARDIOGRAPHY  2006   Ef greater than 60%. There are no regional wall motion abnormalities. There was mild mitral regurgitation, there is no aortic stenosis, aortic valve was mildly calcified  . HERNIA REPAIR  1998  . Lung Hamartoma      Prior to Admission medications   Medication Sig Start Date End Date Taking? Authorizing Provider  aspirin 81 MG EC tablet Take 81 mg by mouth daily.      [provider]  atorvastatin (LIPITOR) 40 MG tablet Take 40 mg by mouth daily.      [provider]  chlorproMAZINE (THORAZINE) 25 MG tablet Take 1 tablet (25 mg total) by mouth 3 (three) times daily as needed for hiccoughs. 05/04/17   Darci Current, MD  metoprolol tartrate (LOPRESSOR) 25 MG tablet Take 1 tablet (25 mg total) by mouth 2 (two) times daily. 09/29/10   Antonieta Iba, MD  Multiple Vitamins-Minerals (CENTRUM) tablet Take 1 tablet by mouth daily.      [provider]  Multiple Vitamins-Minerals (ICAPS) TABS Take 1 tablet by mouth 2 (two) times daily.      [provider]    Allergies Ivp dye [iodinated diagnostic agents] and Simvastatin  Family History  Problem Relation Age of Onset  . Heart attack Mother 61       MI  . Alzheimer's disease Father     Social History Social History   Tobacco Use  . Smoking status: Former Research scientist (life sciences)  . Smokeless tobacco: Never Used  . Tobacco comment: Quit smoking more than 40 years ago  Substance Use Topics  . Alcohol use: Yes    Comment: Rarely  . Drug use: Not on file    Review of Systems  Constitutional: No fever/chills Eyes: No visual changes. ENT: No sore throat. Cardiovascular: Chest pain as described above. Respiratory: Denies shortness of breath. Gastrointestinal: No abdominal pain.  No nausea, no vomiting.  No diarrhea.  No constipation. Genitourinary: Negative for dysuria. Musculoskeletal: Negative for neck pain.  Negative for back pain. Integumentary: Negative for rash. Neurological: Negative for headaches, focal weakness or numbness.   ____________________________________________   PHYSICAL EXAM:  VITAL SIGNS: ED Triage Vitals  Enc Vitals Group     BP 10/17/18 2351 (!) 146/91     Pulse Rate 10/17/18 2351 70     Resp 10/17/18 2351 20     Temp 10/17/18 2351 98.4 F (36.9 C)     Temp Source 10/17/18 2351 Oral     SpO2 10/17/18 2351 100 %     Weight 10/17/18 2349 72.6 kg (160 lb)     Height 10/17/18 2349 1.702 m (5\' 7" )     Head Circumference --      Peak Flow --      Pain Score 10/17/18 2349 0     Pain Loc --      Pain Edu? --      Excl. in [place]? --     Constitutional: Alert and oriented.  Well appearing and in no acute distress, appears younger than chronological age. Eyes: Conjunctivae are normal.  Head: Atraumatic. Nose: No congestion/rhinnorhea. Mouth/Throat: Mucous membranes are moist. Neck: No stridor.  No meningeal signs.   Cardiovascular: Normal rate, regular rhythm. Good peripheral circulation. Grossly normal heart sounds. Respiratory: Normal respiratory effort.  No retractions. Gastrointestinal: Soft and nontender. No distention.  No pulsatile abdominal masses, no bruit. Musculoskeletal: No lower extremity tenderness nor edema. No gross deformities of extremities. Neurologic:  Normal speech and language. No gross focal neurologic deficits are appreciated.  Skin:  Skin is warm, dry and intact. Psychiatric: Mood and affect are normal. Speech and behavior are normal.  ____________________________________________   LABS (all labs  ordered are listed, but only abnormal results are displayed)  Labs Reviewed  CBC WITH DIFFERENTIAL/PLATELET - Abnormal; Notable for the following components:      Result Value   Monocytes Absolute 1.1 (*)    All other components within normal limits  COMPREHENSIVE METABOLIC PANEL - Abnormal; Notable for the following components:   Glucose, Bld 113 (*)    All other components within normal limits  TROPONIN I (HIGH SENSITIVITY)  TROPONIN I (HIGH SENSITIVITY)   ____________________________________________  EKG  ED ECG REPORT I, Hinda Kehr, the attending physician, personally viewed and interpreted this ECG.  Date: 10/17/2018 EKG Time: 23: 52 Rate: 59 Rhythm: normal sinus rhythm QRS Axis: normal Intervals: normal ST/T Wave abnormalities: normal Narrative Interpretation: no evidence of acute ischemia  ____________________________________________  RADIOLOGY Ursula Alert, personally viewed and evaluated  these images (plain radiographs) as part of my medical decision making, as well as reviewing the written report by the radiologist.  ED MD interpretation: Normal chest x-ray with no acute abnormality.  Official radiology report(s): Dg Chest 2 View  Result Date: 10/18/2018 CLINICAL DATA:  Chest pain EXAM: CHEST - 2 VIEW COMPARISON:  05/03/2017 FINDINGS: Hyperinflation. Streaky bilateral lower lung scarring. No acute opacity or pleural effusion. Stable cardiomediastinal silhouette with aortic atherosclerosis. No pneumothorax. IMPRESSION: No active cardiopulmonary disease. Similar appearance of streaky probable scarring at both lung bases. Electronically Signed   By: Jasmine PangKim  Fujinaga M.D.   On: 10/18/2018 01:34    ____________________________________________   PROCEDURES   Procedure(s) performed (including Critical Care):  Procedures   ____________________________________________   INITIAL IMPRESSION / MDM / ASSESSMENT AND PLAN / ED COURSE  As part of my medical  decision making, I reviewed the following data within the electronic MEDICAL RECORD NUMBER History obtained from family, Nursing notes reviewed and incorporated, Labs reviewed , EKG interpreted , Old chart reviewed, Radiograph reviewed  and Notes from prior ED visits   Differential diagnosis includes, but is not limited to, ACS, PE, aortic disease (thoracic or abdominal aneurysm or dissection), acute infection such as pneumonia or COVID-19, musculoskeletal strain.  The patient is well-appearing in no distress.  Normal and stable vital signs.  High-sensitivity troponins were tested twice and the results were reassuring at for each time.  He is low risk for ACS based on his HEAR score and although the PERC rule does not apply because of his age, he has a wells score for PE of 0.   I discussed hospitalization with him but he feels comfortable with the 2- troponins I think this is unlikely to be an emergent medical condition.  He is having no abdominal pain and all of his aortic issues in the past have been with abdominal aortic aneurysms.  His symptoms have resolved and he has been observed in the emergency department for 4 hours with no recurrence.  He is comfortable with plan for discharge and outpatient follow-up with his doctors and I think that is appropriate.  I gave my usual and customary return precautions.       ____________________________________________  FINAL CLINICAL IMPRESSION(S) / ED DIAGNOSES  Final diagnoses:  Chest pain, unspecified type     MEDICATIONS GIVEN DURING THIS VISIT:  Medications - No data to display   ED Discharge Orders    None      *Please note:  Jerry [name] was evaluated in Emergency Department on 10/18/2018 for the symptoms described in the history of present illness. He was evaluated in the context of the global COVID-19 pandemic, which necessitated consideration that the patient might be at risk for infection with the SARS-CoV-2 virus that causes  COVID-19. Institutional protocols and algorithms that pertain to the evaluation of patients at risk for COVID-19 are in a state of rapid change based on information released by regulatory bodies including the CDC and federal and state organizations. These policies and algorithms were followed during the patient's care in the ED.  Some ED evaluations and interventions may be delayed as a result of limited staffing during the pandemic.*  Note:  This document was prepared using Dragon voice recognition software and may include unintentional dictation errors.   Loleta RoseForbach, Maevis Mumby, MD 10/18/18 [phone]

## 2019-11-04 ENCOUNTER — Ambulatory Visit: Payer: Medicare Other | Attending: Internal Medicine

## 2019-11-04 DIAGNOSIS — Z23 Encounter for immunization: Secondary | ICD-10-CM

## 2019-11-04 NOTE — Progress Notes (Signed)
   Covid-19 Vaccination Clinic  Name:  Jerry [name]    MRN: [id number] DOB: [birth year]  11/04/2019  [name] was observed post Covid-19 immunization for 15 minutes without incident. He was provided with Vaccine Information Sheet and instruction to access the V-Safe system.   [name] was instructed to call 911 with any severe reactions post vaccine: Marland Kitchen Difficulty breathing  . Swelling of face and throat  . A fast heartbeat  . A bad rash all over body  . Dizziness and weakness

## 2019-12-23 DIAGNOSIS — I723 Aneurysm of iliac artery: Secondary | ICD-10-CM | POA: Insufficient documentation

## 2020-09-23 ENCOUNTER — Other Ambulatory Visit (INDEPENDENT_AMBULATORY_CARE_PROVIDER_SITE_OTHER): Payer: Self-pay | Admitting: Vascular Surgery

## 2020-09-23 DIAGNOSIS — I714 Abdominal aortic aneurysm, without rupture, unspecified: Secondary | ICD-10-CM

## 2020-09-23 DIAGNOSIS — I724 Aneurysm of artery of lower extremity: Secondary | ICD-10-CM

## 2020-09-25 ENCOUNTER — Ambulatory Visit (INDEPENDENT_AMBULATORY_CARE_PROVIDER_SITE_OTHER): Payer: Medicare Other

## 2020-09-25 ENCOUNTER — Encounter (INDEPENDENT_AMBULATORY_CARE_PROVIDER_SITE_OTHER): Payer: Self-pay | Admitting: Vascular Surgery

## 2020-09-25 ENCOUNTER — Encounter (INDEPENDENT_AMBULATORY_CARE_PROVIDER_SITE_OTHER): Payer: Self-pay

## 2020-09-25 ENCOUNTER — Other Ambulatory Visit: Payer: Self-pay

## 2020-09-25 ENCOUNTER — Telehealth (INDEPENDENT_AMBULATORY_CARE_PROVIDER_SITE_OTHER): Payer: Self-pay | Admitting: Nurse Practitioner

## 2020-09-25 ENCOUNTER — Other Ambulatory Visit (INDEPENDENT_AMBULATORY_CARE_PROVIDER_SITE_OTHER): Payer: Self-pay

## 2020-09-25 ENCOUNTER — Ambulatory Visit (INDEPENDENT_AMBULATORY_CARE_PROVIDER_SITE_OTHER): Payer: Medicare Other | Admitting: Nurse Practitioner

## 2020-09-25 VITALS — BP 158/79 | HR 60 | Ht 67.0 in | Wt 162.0 lb

## 2020-09-25 DIAGNOSIS — I714 Abdominal aortic aneurysm, without rupture, unspecified: Secondary | ICD-10-CM

## 2020-09-25 DIAGNOSIS — I723 Aneurysm of iliac artery: Secondary | ICD-10-CM | POA: Diagnosis not present

## 2020-09-25 DIAGNOSIS — I10 Essential (primary) hypertension: Secondary | ICD-10-CM | POA: Diagnosis not present

## 2020-09-25 DIAGNOSIS — I724 Aneurysm of artery of lower extremity: Secondary | ICD-10-CM

## 2020-09-25 DIAGNOSIS — Z461 Encounter for fitting and adjustment of hearing aid: Secondary | ICD-10-CM | POA: Insufficient documentation

## 2020-09-25 DIAGNOSIS — H353132 Nonexudative age-related macular degeneration, bilateral, intermediate dry stage: Secondary | ICD-10-CM | POA: Insufficient documentation

## 2020-09-25 NOTE — Telephone Encounter (Signed)
Pharmacy updated.

## 2020-09-25 NOTE — Telephone Encounter (Signed)
Called to update his pharmacy information.  Patient uses [facility] [address] [place]

## 2020-10-05 ENCOUNTER — Encounter (INDEPENDENT_AMBULATORY_CARE_PROVIDER_SITE_OTHER): Payer: Self-pay | Admitting: Nurse Practitioner

## 2020-10-05 NOTE — Progress Notes (Signed)
Subjective:    Patient ID: Jerry [name], male    DOB: 1928/06/13, 87 y.o.   MRN: [id number] Chief Complaint  Patient presents with   New Patient (Initial Visit)    NP anderson abd aortic aneurysm , without rupture. Aneurysm  of artery and [place]    Jerry [name] is a 87 year old male that presents today after referral by Dr. Dareen Piano for his numerous vascular issues.  The patient was previously followed for his vascular issues at [facility] however he wishes to pursue follow-up with [place] after numerous provider changes there.  The patient has a known history of abdominal aortic aneurysm with a previous endovascular aneurysm repair.  This was done in 2002.  The patient also has bilateral popliteal artery aneurysms.  He also has a history of Raynaud's disease.  This is also under good control.  He denies any claudication-like symptoms.  He does endorse discoloration of EXTR extremities but this is not abnormal given his Raynaud's disease.  Today noninvasive studies show an abdominal aortic aneurysm measuring 3.6 cm.  The endovascular aneurysm repair has no and evidence of endoleak.  There is no significant change in the maximal diameters of the distal aorta or of the bilateral common iliac arteries.  The right common iliac artery measured at approximately 3.4 cm and the left measured at 1.5.  These 2 are consistent with the previous studies done on 11/25/2019 at [facility].  The patient also has a right popliteal aneurysm of 1.13 x 1.24 cm this is consistent with the previous studies done 11/25/2019 and 2.  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  Previous studies it did measure at 2.0 cm x 2.0 cm also with a mural thrombus on 11/25/2019.   Review of Systems  Cardiovascular:  Negative for leg swelling.  Skin:  Negative for wound.  All other systems reviewed and are negative.     Objective:   Physical Exam Vitals reviewed.  HENT:     Head: Normocephalic.  Cardiovascular:      Rate and Rhythm: Normal rate.     Pulses:          Dorsalis pedis pulses are 1+ on the right side and 1+ on the left side.       Posterior tibial pulses are 1+ on the right side and 1+ on the left side.  Pulmonary:     Effort: Pulmonary effort is normal.  Musculoskeletal:        General: Normal range of motion.  Skin:    General: Skin is warm and dry.  Neurological:     Mental Status: He is alert and oriented to person, place, and time.  Psychiatric:        Mood and Affect: Mood normal.        Behavior: Behavior normal.        Thought Content: Thought content normal.        Judgment: Judgment normal.    BP (!) 158/79   Pulse 60   Ht 5\' 7"  (1.702 m)   Wt 162 lb (73.5 kg)   BMI 25.37 kg/m   Past Medical History:  Diagnosis Date   AAA (abdominal aortic aneurysm) (HCC)    Aneurysm (HCC)    History of multiple vascular aneurysms. [name] involve the aorta, te iliac arteries, and the popliteal arteries.  The pt is s/p stent graft of an abdominal aortic aneurysm. All of his aneurysm follow up as been  done at [facility].   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 This is manifested and is elevated, sed rate, pleuritis, Raynaud's pehnomenon and she does have a positive rheumatoid factor.  He is being treated with Plquenil for his collagen vascular disease   Colonic polyp    History of nephrolithiasis    Hyperlipidemia    Hypertension    Macular degeneration    Osteoarthrosis, hip    Left hip   Paget's disease     Social History   Socioeconomic History   Marital status: Married    Spouse name: Not on file   Number of children: Not on file   Years of education: Not on file   Highest education level: Not on file  Occupational History   Occupation: Retired  Tobacco Use   Smoking status: Former   Smokeless tobacco: Never   Tobacco comments:    Quit smoking more than 40 years ago  Substance and Sexual Activity   Alcohol use: Yes     Comment: Rarely   Drug use: Not on file   Sexual activity: Not on file  Other Topics Concern   Not on file  Social History Narrative   Married and lives in [place]   3 children and grandchildren   Social Determinants of Health   Financial Resource Strain: Not on file  Food Insecurity: Not on file  Transportation Needs: Not on file  Physical Activity: Not on file  Stress: Not on file  Social Connections: Not on file  Intimate Partner Violence: Not on file    Past Surgical History:  Procedure Laterality Date   ABDOMINAL AORTIC ANEURYSM REPAIR     DOPPLER ECHOCARDIOGRAPHY  2006   Ef greater than 60%. There are no regional wall motion abnormalities. There was mild mitral regurgitation, there is no aortic stenosis, aortic valve was mildly calcified   HERNIA REPAIR  1998   Lung Hamartoma      Family History  Problem Relation Age of Onset   Heart attack Mother 43       MI   Alzheimer's disease Father     Allergies  Allergen Reactions   Ivp Dye [Iodinated Diagnostic Agents]    Simvastatin     REACTION: joint pains   Soap Itching    fragrant soaps    CBC Latest Ref Rng & Units 10/17/2018 05/03/2017  WBC 4.0 - 10.5 K/uL 6.2 8.4  Hemoglobin 13.0 - 17.0 g/dL 89.2 11.9  Hematocrit 41.7 - 52.0 % 44.7 46.1  Platelets 150 - 400 K/uL 254 200      CMP     Component Value Date/Time   NA 141 10/17/2018 2352   K 4.6 10/17/2018 2352   CL 108 10/17/2018 2352   CO2 26 10/17/2018 2352   GLUCOSE 113 (H) 10/17/2018 2352   BUN 20 10/17/2018 2352   CREATININE 1.02 10/17/2018 2352   CALCIUM 9.7 10/17/2018 2352   PROT 7.0 10/17/2018 2352   ALBUMIN 4.2 10/17/2018 2352   AST 22 10/17/2018 2352   ALT 19 10/17/2018 2352   ALKPHOS 105 10/17/2018 2352   BILITOT 0.5 10/17/2018 2352   GFRNONAA >60 10/17/2018 2352   GFRAA >60 10/17/2018 2352     No results found.     Assessment & Plan:   1. AAA (abdominal aortic aneurysm) without rupture (HCC) Recommend: Patient is status  post successful endovascular repair of the AAA.   No further intervention is required at this  time.   No endoleak is detected and the aneurysm sac is stable.  The patient will continue antiplatelet therapy as prescribed as well as aggressive management of hyperlipidemia. Exercise is again strongly encouraged.   However, endografts require continued surveillance with ultrasound or CT scan. This is mandatory to detect any changes that allow repressurization of the aneurysm sac.  The patient is informed that this would be asymptomatic.  The patient is reminded that lifelong routine surveillance is a necessity with an endograft. Patient will continue to follow-up at 12 month intervals with ultrasound of the aorta.   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  Studies were done on 11/25/2019.  The right iliac artery was treated with a graft during his abdominal aortic aneurysm.  Currently no evidence of leak or growth.  We will continue with annual evaluation.  3. Popliteal aneurysm (HCC) The patient continues to have evidence of popliteal aneurysm on duplex.  This popliteal aneurysm is consistent with the previous studies that were done at [facility] on 11/25/2019.  We will have the patient return to the office in 6 months to review noninvasive studies however sooner if the patient begins to have pain or discoloration of his lower extremities.  4. Essential hypertension Continue antihypertensive medications as already ordered, these medications have been reviewed and there are no changes at this time.     Current Outpatient Medications on File Prior to Visit  Medication Sig Dispense Refill   amLODipine (NORVASC) 2.5 MG tablet Take 1 tablet by mouth daily.     aspirin 81 MG EC tablet Take 81 mg by mouth daily.       atorvastatin (LIPITOR) 40 MG tablet Take 40 mg by mouth daily.        chlorproMAZINE (THORAZINE) 25 MG tablet Take 1 tablet (25 mg total) by mouth 3 (three) times daily as needed for hiccoughs. 21 tablet 0   fluticasone-salmeterol (ADVAIR) 100-50 MCG/ACT AEPB Inhale into the lungs.     metoprolol tartrate (LOPRESSOR) 25 MG tablet Take 1 tablet (25 mg total) by mouth 2 (two) times daily. 60 tablet 6   Multiple Vitamins-Minerals (CENTRUM) tablet Take 1 tablet by mouth daily.       Multiple Vitamins-Minerals (ICAPS) TABS Take 1 tablet by mouth 2 (two) times daily.       pantoprazole (PROTONIX) 40 MG tablet Take 40 mg by mouth daily.     No current facility-administered medications on file prior to visit.    There are no Patient Instructions on file for this visit. No follow-ups on file.   Georgiana Spinner, NP

## 2020-10-23 ENCOUNTER — Encounter (INDEPENDENT_AMBULATORY_CARE_PROVIDER_SITE_OTHER): Payer: Self-pay | Admitting: Vascular Surgery

## 2020-10-23 ENCOUNTER — Other Ambulatory Visit (INDEPENDENT_AMBULATORY_CARE_PROVIDER_SITE_OTHER): Payer: Self-pay

## 2020-10-23 ENCOUNTER — Encounter (INDEPENDENT_AMBULATORY_CARE_PROVIDER_SITE_OTHER): Payer: Self-pay

## 2021-01-08 ENCOUNTER — Emergency Department: Payer: Medicare Other

## 2021-01-08 ENCOUNTER — Encounter: Payer: Self-pay | Admitting: Emergency Medicine

## 2021-01-08 ENCOUNTER — Emergency Department
Admission: EM | Admit: 2021-01-08 | Discharge: 2021-01-08 | Disposition: A | Discharge status: Home / Self Care | Payer: Medicare Other | Attending: Emergency Medicine | Admitting: Emergency Medicine

## 2021-01-08 ENCOUNTER — Other Ambulatory Visit: Payer: Self-pay

## 2021-01-08 DIAGNOSIS — S6992XA Unspecified injury of left wrist, hand and finger(s), initial encounter: Secondary | ICD-10-CM | POA: Diagnosis present

## 2021-01-08 DIAGNOSIS — Z79899 Other long term (current) drug therapy: Secondary | ICD-10-CM | POA: Diagnosis not present

## 2021-01-08 DIAGNOSIS — S61412A Laceration without foreign body of left hand, initial encounter: Secondary | ICD-10-CM | POA: Diagnosis not present

## 2021-01-08 DIAGNOSIS — W101XXA Fall (on)(from) sidewalk curb, initial encounter: Secondary | ICD-10-CM | POA: Insufficient documentation

## 2021-01-08 DIAGNOSIS — I10 Essential (primary) hypertension: Secondary | ICD-10-CM | POA: Insufficient documentation

## 2021-01-08 DIAGNOSIS — S0081XA Abrasion of other part of head, initial encounter: Secondary | ICD-10-CM

## 2021-01-08 DIAGNOSIS — Z7982 Long term (current) use of aspirin: Secondary | ICD-10-CM | POA: Insufficient documentation

## 2021-01-08 DIAGNOSIS — R52 Pain, unspecified: Secondary | ICD-10-CM

## 2021-01-08 DIAGNOSIS — W19XXXA Unspecified fall, initial encounter: Secondary | ICD-10-CM

## 2021-01-08 DIAGNOSIS — S0990XA Unspecified injury of head, initial encounter: Secondary | ICD-10-CM | POA: Diagnosis not present

## 2021-01-08 DIAGNOSIS — Z87891 Personal history of nicotine dependence: Secondary | ICD-10-CM | POA: Insufficient documentation

## 2021-01-08 MED ORDER — LIDOCAINE HCL (PF) 1 % IJ SOLN
5.0000 mL | Freq: Once | INTRAMUSCULAR | Status: DC
  Filled 2021-01-08: qty 5

## 2021-01-08 NOTE — ED Provider Notes (Signed)
[facility] Emergency Department Provider Note ____________________________________________  Time seen: 1550  I have reviewed the triage vital signs and the nursing notes.  HISTORY  Chief Complaint  Fall   HPI SI Jerry [name] is a 87 y.o. male presents to the ED with the below medical history, for evaluation of injury sustained following mechanical fall.  Patient is only on a baby aspirin by report.  Denies any serious head injury, noting that he turned his head to the side, and abraded his ear and cheek.  He denies any loss of consciousness, nosebleed, or dental injury.  Past Medical History:  Diagnosis Date   AAA (abdominal aortic aneurysm)    Aneurysm (HCC)    History of multiple vascular aneurysms. [name] involve the aorta, te iliac arteries, and the popliteal arteries.  The pt is s/p stent graft of an abdominal aortic aneurysm. All of his aneurysm follow up as been done at [facility].   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 This is manifested and is elevated, sed rate, pleuritis, Raynaud's pehnomenon and she does have a positive rheumatoid factor.  He is being treated with Plquenil for his collagen vascular disease   Colonic polyp    History of nephrolithiasis    Hyperlipidemia    Hypertension    Macular degeneration    Osteoarthrosis, hip    Left hip   Paget's disease     Patient Active Problem List   Diagnosis Date Noted   Encounter for fitting and adjustment of hearing aid 09/25/2020   Nonexudative age-related macular degeneration, bilateral, intermediate dry stage 09/25/2020   Iliac artery aneurysm (HCC) 12/23/2019   AAA (abdominal aortic aneurysm) without rupture 09/18/2018   Bilateral sensorineural hearing loss 09/18/2018   Prediabetes 09/18/2018   Raynaud's disease 09/18/2018   Healthcare maintenance 05/18/2016   History of Paget's disease of bone 02/05/2016   Essential hypertension 05/12/2014    Popliteal aneurysm (HCC) 08/03/2012   Hx of smoking 08/01/2012   Renal stones 08/01/2012   HYPERLIPIDEMIA-MIXED 09/03/2009   PREMATURE VENTRICULAR CONTRACTIONS 09/03/2009    Past Surgical History:  Procedure Laterality Date   ABDOMINAL AORTIC ANEURYSM REPAIR     DOPPLER ECHOCARDIOGRAPHY  2006   Ef greater than 60%. There are no regional wall motion abnormalities. There was mild mitral regurgitation, there is no aortic stenosis, aortic valve was mildly calcified   HERNIA REPAIR  1998   Lung Hamartoma      Prior to Admission medications   Medication Sig Start Date End Date Taking? Authorizing Provider  amLODipine (NORVASC) 2.5 MG tablet Take 1 tablet by mouth daily. 03/14/17   [provider]  aspirin 81 MG EC tablet Take 81 mg by mouth daily.      [provider]  atorvastatin (LIPITOR) 40 MG tablet Take 40 mg by mouth daily.      [provider]  chlorproMAZINE (THORAZINE) 25 MG tablet Take 1 tablet (25 mg total) by mouth 3 (three) times daily as needed for hiccoughs. 05/04/17   Darci Current, MD  fluticasone-salmeterol (ADVAIR) 100-50 MCG/ACT AEPB Inhale into the lungs. 03/29/19   [provider]  metoprolol tartrate (LOPRESSOR) 25 MG tablet Take 1 tablet (25 mg total) by mouth 2 (two) times daily. 09/29/10   Antonieta Iba, MD  Multiple Vitamins-Minerals (CENTRUM) tablet Take 1 tablet by mouth daily.      [provider]  Multiple Vitamins-Minerals (ICAPS) TABS Take 1 tablet by mouth  2 (two) times daily.      [provider]  pantoprazole (PROTONIX) 40 MG tablet Take 40 mg by mouth daily. 08/27/20   [provider]    Allergies Ivp dye [iodinated diagnostic agents], Simvastatin, and Soap  Family History  Problem Relation Age of Onset   Heart attack Mother 69       MI   Alzheimer's disease Father     Social History Social History   Tobacco Use   Smoking status: Former   Smokeless tobacco: Never   Tobacco  comments:    Quit smoking more than 40 years ago  Substance Use Topics   Alcohol use: Yes    Comment: Rarely    Review of Systems  Constitutional: Negative for fever. Eyes: Negative for visual changes. ENT: Negative for sore throat. Cardiovascular: Negative for chest pain. Respiratory: Negative for shortness of breath. Gastrointestinal: Negative for abdominal pain, vomiting and diarrhea. Genitourinary: Negative for dysuria. Musculoskeletal: Negative for back pain. Skin: Negative for rash. Right thumb laceration, left palm laceration. Left ear abrasion.  Neurological: Negative for headaches, focal weakness or numbness. ____________________________________________  PHYSICAL EXAM:  VITAL SIGNS: ED Triage Vitals [01/08/21 1326]  Enc Vitals Group     BP      Pulse      Resp      Temp      Temp src      SpO2      Weight 162 lb 0.6 oz (73.5 kg)     Height 5\' 7"  (1.702 m)     Head Circumference      Peak Flow      Pain Score 1     Pain Loc      Pain Edu?      Excl. in GC?     Constitutional: Alert and oriented. Well appearing and in no distress. Head: Normocephalic and atraumatic. Eyes: Conjunctivae abrasion noted to the pinna of the left ear.. Nose: No congestion/rhinorrhea/epistaxis. Mouth/Throat: Mucous membranes are moist. Neck: Supple.  Normal range of motion without crepitus or midline tenderness. Cardiovascular: Normal rate, regular rhythm. Normal distal pulses. Respiratory: Normal respiratory effort. No wheezes/rales/rhonchi. Gastrointestinal: Soft and nontender. No distention. Musculoskeletal: Left hand with normal composite fist.  Patient does have a ulnar laceration over the palmar fifth MC.  The left thumb with an abrasion over the PIP.  Normal composite fist and normal thumb range of motion.  No nail injury is appreciated.  Nontender with normal range of motion in all extremities.  Neurologic: Cranial nerves II through XII grossly intact.  Normal speech and  language. No gross focal neurologic deficits are appreciated. Skin:  Skin is warm, dry and intact. No rash noted. Psychiatric: Mood and affect are normal. Patient exhibits appropriate insight and judgment. ____________________________________________    {LABS (pertinent positives/negatives)  ____________________________________________  {EKG  ____________________________________________   RADIOLOGY Official radiology report(s): CT HEAD WO CONTRAST ( )  Result Date: 01/08/2021 CLINICAL DATA:  Trip and fall, abrasions on left side of head EXAM: CT HEAD WITHOUT CONTRAST CT CERVICAL SPINE WITHOUT CONTRAST TECHNIQUE: Multidetector CT imaging of the head and cervical spine was performed following the standard protocol without intravenous contrast. Multiplanar CT image reconstructions of the cervical spine were also generated. COMPARISON:  None. FINDINGS: CT HEAD FINDINGS Brain: No evidence of acute infarction, hemorrhage, hydrocephalus, extra-axial collection or mass lesion/mass effect. Periventricular and deep white matter hypodensity. Vascular: No hyperdense vessel or unexpected calcification. Skull: Normal. Negative for fracture or focal lesion. Sinuses/Orbits: No acute  finding. Other: None. CT CERVICAL SPINE FINDINGS Alignment: Degenerative straightening of the normal cervical lordosis. Skull base and vertebrae: No acute fracture. No primary bone lesion or focal pathologic process. Soft tissues and spinal canal: No prevertebral fluid or swelling. No visible canal hematoma. Disc levels: Mild to moderate multilevel disc space height loss and osteophytosis. Upper chest: Negative. Other: None. IMPRESSION: 1. No acute intracranial pathology. Small-vessel white matter disease. 2. No fracture or static subluxation of the cervical spine. 3. Mild to moderate multilevel cervical disc degenerative disease. Electronically Signed   By: Jearld Lesch M.D.   On: 01/08/2021 14:41   CT Cervical Spine Wo  Contrast  Result Date: 01/08/2021 CLINICAL DATA:  Trip and fall, abrasions on left side of head EXAM: CT HEAD WITHOUT CONTRAST CT CERVICAL SPINE WITHOUT CONTRAST TECHNIQUE: Multidetector CT imaging of the head and cervical spine was performed following the standard protocol without intravenous contrast. Multiplanar CT image reconstructions of the cervical spine were also generated. COMPARISON:  None. FINDINGS: CT HEAD FINDINGS Brain: No evidence of acute infarction, hemorrhage, hydrocephalus, extra-axial collection or mass lesion/mass effect. Periventricular and deep white matter hypodensity. Vascular: No hyperdense vessel or unexpected calcification. Skull: Normal. Negative for fracture or focal lesion. Sinuses/Orbits: No acute finding. Other: None. CT CERVICAL SPINE FINDINGS Alignment: Degenerative straightening of the normal cervical lordosis. Skull base and vertebrae: No acute fracture. No primary bone lesion or focal pathologic process. Soft tissues and spinal canal: No prevertebral fluid or swelling. No visible canal hematoma. Disc levels: Mild to moderate multilevel disc space height loss and osteophytosis. Upper chest: Negative. Other: None. IMPRESSION: 1. No acute intracranial pathology. Small-vessel white matter disease. 2. No fracture or static subluxation of the cervical spine. 3. Mild to moderate multilevel cervical disc degenerative disease. Electronically Signed   By: Jearld Lesch M.D.   On: 01/08/2021 14:41   DG Hand Complete Left  Result Date: 01/08/2021 CLINICAL DATA:  Trauma, fall EXAM: LEFT HAND - COMPLETE 3+ VIEW COMPARISON:  None. FINDINGS: No recent fracture or dislocation is seen. Degenerative changes are noted in multiple interphalangeal joints, more so in the PIP joint of the index finger. Degenerative changes are noted in first carpometacarpal joint. There are subcortical cysts in the scaphoid, possibly due to degenerative changes. Bony spurs seen in few metacarpophalangeal  joints, more so in the second MCP. IMPRESSION: No recent fracture or dislocation is seen. Degenerative changes are noted in multiple joints as described in the body of the report. Electronically Signed   By: Ernie Avena M.D.   On: 01/08/2021 16:41   ____________________________________________  PROCEDURES   .Marland KitchenLaceration Repair  Date/Time: 01/08/2021 4:24 PM Performed by: Lissa Hoard, PA-C Authorized by: Lissa Hoard, PA-C   Consent:    Consent obtained:  Verbal   Consent given by:  Patient   Risks, benefits, and alternatives were discussed: yes     Risks discussed:  Pain and poor wound healing Universal protocol:    Imaging studies available: yes     Site/side marked: yes     Immediately prior to procedure, a time out was called: yes     Patient identity confirmed:  Verbally with patient Anesthesia:    Anesthesia method:  Local infiltration   Local anesthetic:  Lidocaine 1% w/o epi Laceration details:    Location:  Hand   Hand location:  L palm   Length (cm):  4   Depth (mm):  3 Pre-procedure details:    Preparation:  Patient was  prepped and draped in usual sterile fashion Exploration:    Limited defect created (wound extended): no     Contaminated: no   Treatment:    Area cleansed with:  Saline and povidone-iodine   Amount of cleaning:  Standard   Irrigation solution:  Sterile saline   Irrigation method:  Syringe   Debridement:  None   Undermining:  None   Scar revision: no   Skin repair:    Repair method:  Sutures and Steri-Strips   Suture size:  4-0   Suture material:  Nylon   Suture technique:  Simple interrupted   Number of sutures:  7   Number of Steri-Strips:  5 Approximation:    Approximation:  Close Repair type:    Repair type:  Simple Post-procedure details:    Dressing:  Non-adherent dressing   Procedure completion:  Tolerated well, no immediate complications ____________________________________________   INITIAL  IMPRESSION / ASSESSMENT AND PLAN / ED COURSE  As part of my medical decision making, I reviewed the following data within the electronic MEDICAL RECORD NUMBER Radiograph reviewed WNL, Notes from prior ED visits, and [place] Controlled Substance Database   DDX: SDH, cervical fracture, hand fracture, facial contusion  Geriatric patient with ED evaluation of injury sustained following a mechanical fall.  Patient currently tripped over the curb curb, landing with outstretched hands.  He presents with abrasions to the left side of the ear as well as reports of head injury without LOC.  He also endorses a left palmar laceration and a right thumb abrasion.  He is evaluated for his complaints in ED found to have overall reassuring exam and negative imaging at this time.  No intracranial process appreciated on CT and no acute cervical spine fracture is noted.  Left hand without foreign body or fracture.  He consents to wound repair and the left palm was repaired using sutures and wound glue was applied over the abrasion of the right thumb.  Patient otherwise stable condition is discharged with wound care instructions and supplies.  He will see his provider in 7 to 10 days for suture removal.  Return precautions have been reviewed.   Rafael Salway Funes was evaluated in Emergency Department on 01/08/2021 for the symptoms described in the history of present illness. He was evaluated in the context of the global COVID-19 pandemic, which necessitated consideration that the patient might be at risk for infection with the SARS-CoV-2 virus that causes COVID-19. Institutional protocols and algorithms that pertain to the evaluation of patients at risk for COVID-19 are in a state of rapid change based on information released by regulatory bodies including the CDC and federal and state organizations. These policies and algorithms were followed during the patient's care in the ED. ____________________________________________  FINAL  CLINICAL IMPRESSION(S) / ED DIAGNOSES  Final diagnoses:  Fall, initial encounter  Minor head injury, initial encounter  Facial abrasion, initial encounter  Laceration of left hand without foreign body, initial encounter      Lissa Hoard, PA-C 01/08/21 1759    Concha Se, MD 01/08/21 1842

## 2021-01-08 NOTE — Discharge Instructions (Signed)
Your exam, CT scans, and hand x-ray are negative for any acute fracture or serious head injury.  Your lacerations have been repaired using sutures.  Keep the wounds clean, dry, and covered.  Follow-up with your primary provider for suture removal in 7 to 10 days.

## 2021-01-08 NOTE — ED Triage Notes (Signed)
Pt comes into the ED via POV c/o mechanical fall after tripping on the curb.  Pt has laceration on the left hand and right thumb.  Pt also has abrasions on the left side of his head.  Pt denies any LOC, but the patient is currently on blood thinners.  Pt is ambulatory at this time with great gait.  Pt in NAD with even and unlabored respirations.

## 2021-03-31 ENCOUNTER — Other Ambulatory Visit (INDEPENDENT_AMBULATORY_CARE_PROVIDER_SITE_OTHER): Payer: Self-pay | Admitting: Nurse Practitioner

## 2021-03-31 ENCOUNTER — Other Ambulatory Visit (INDEPENDENT_AMBULATORY_CARE_PROVIDER_SITE_OTHER): Payer: Self-pay | Admitting: Vascular Surgery

## 2021-03-31 DIAGNOSIS — I714 Abdominal aortic aneurysm, without rupture, unspecified: Secondary | ICD-10-CM

## 2021-03-31 DIAGNOSIS — I724 Aneurysm of artery of lower extremity: Secondary | ICD-10-CM

## 2021-04-02 ENCOUNTER — Other Ambulatory Visit: Payer: Self-pay

## 2021-04-02 ENCOUNTER — Ambulatory Visit (INDEPENDENT_AMBULATORY_CARE_PROVIDER_SITE_OTHER): Payer: Medicare Other

## 2021-04-02 ENCOUNTER — Ambulatory Visit (INDEPENDENT_AMBULATORY_CARE_PROVIDER_SITE_OTHER): Payer: Medicare Other | Admitting: Vascular Surgery

## 2021-04-02 VITALS — BP 166/84 | HR 99 | Ht 66.0 in | Wt 162.0 lb

## 2021-04-02 DIAGNOSIS — I7143 Infrarenal abdominal aortic aneurysm, without rupture: Secondary | ICD-10-CM | POA: Diagnosis not present

## 2021-04-02 DIAGNOSIS — I714 Abdominal aortic aneurysm, without rupture, unspecified: Secondary | ICD-10-CM | POA: Diagnosis not present

## 2021-04-02 DIAGNOSIS — E785 Hyperlipidemia, unspecified: Secondary | ICD-10-CM | POA: Diagnosis not present

## 2021-04-02 DIAGNOSIS — I724 Aneurysm of artery of lower extremity: Secondary | ICD-10-CM

## 2021-04-02 DIAGNOSIS — I10 Essential (primary) hypertension: Secondary | ICD-10-CM

## 2021-04-02 NOTE — Assessment & Plan Note (Signed)
lipid control important in reducing the progression of atherosclerotic disease. Continue statin therapy  

## 2021-04-02 NOTE — Assessment & Plan Note (Signed)
Duplex today shows a reasonably stable 1.1 cm right popliteal artery aneurysm and a 2.1 cm left popliteal artery aneurysm.  No claudication or ischemic symptoms at this time.  Given his advanced age, we will going to continue to monitor this although we are approaching the size needed for prophylactic repair on the left.  Recheck in 6 months.

## 2021-04-02 NOTE — Assessment & Plan Note (Signed)
blood pressure control important in reducing the progression of atherosclerotic disease and AAA growth. On appropriate oral medications.  

## 2021-04-02 NOTE — Assessment & Plan Note (Signed)
His duplex today shows a stable 3.8 cm aortic sac and a patent stent graft without endoleak.  Doing well status postrepair.  Continue to follow annually.

## 2021-04-02 NOTE — Progress Notes (Signed)
MRN : [id number]  Jerry [name] is a 88 y.o. (May 17, 1928) male who presents with chief complaint of  Chief Complaint  Patient presents with   Follow-up    6 mo  U/S  .  History of Present Illness: Patient returns today in follow up of multiple vascular issues.  He is status post stent graft repair of an abdominal aortic aneurysm many years ago.  He has bilateral popliteal aneurysms as well.  He is doing well today and has no specific complaints.  He denies any aneurysm related symptoms. Specifically, the patient denies new back or abdominal pain, or signs of peripheral embolization His duplex today shows a stable 3.8 cm aortic sac and a patent stent graft without endoleak. Duplex today shows a reasonably stable 1.1 cm right popliteal artery aneurysm and a 2.1 cm left popliteal artery aneurysm.  Current Outpatient Medications  Medication Sig Dispense Refill   amLODipine (NORVASC) 2.5 MG tablet Take 1 tablet by mouth daily.     aspirin 81 MG EC tablet Take 81 mg by mouth daily.       atorvastatin (LIPITOR) 40 MG tablet Take 40 mg by mouth daily.       chlorproMAZINE (THORAZINE) 25 MG tablet Take 1 tablet (25 mg total) by mouth 3 (three) times daily as needed for hiccoughs. 21 tablet 0   fluticasone-salmeterol (ADVAIR) 100-50 MCG/ACT AEPB Inhale into the lungs.     metoprolol tartrate (LOPRESSOR) 25 MG tablet Take 1 tablet (25 mg total) by mouth 2 (two) times daily. 60 tablet 6   Multiple Vitamins-Minerals (CENTRUM) tablet Take 1 tablet by mouth daily.       Multiple Vitamins-Minerals (ICAPS) TABS Take 1 tablet by mouth 2 (two) times daily.       pantoprazole (PROTONIX) 40 MG tablet Take 40 mg by mouth daily.     No current facility-administered medications for this visit.    Past Medical History:  Diagnosis Date   AAA (abdominal aortic aneurysm)    Aneurysm (HCC)    History of multiple vascular aneurysms. [name] involve the aorta, te iliac arteries, and the popliteal arteries.   The pt is s/p stent graft of an abdominal aortic aneurysm. All of his aneurysm follow up as been done at [facility].   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 This is manifested and is elevated, sed rate, pleuritis, Raynaud's pehnomenon and she does have a positive rheumatoid factor.  He is being treated with Plquenil for his collagen vascular disease   Colonic polyp    History of nephrolithiasis    Hyperlipidemia    Hypertension    Macular degeneration    Osteoarthrosis, hip    Left hip   Paget's disease     Past Surgical History:  Procedure Laterality Date   ABDOMINAL AORTIC ANEURYSM REPAIR     DOPPLER ECHOCARDIOGRAPHY  2006   Ef greater than 60%. There are no regional wall motion abnormalities. There was mild mitral regurgitation, there is no aortic stenosis, aortic valve was mildly calcified   HERNIA REPAIR  1998   Lung Hamartoma       Social History   Tobacco Use   Smoking status: Former   Smokeless tobacco: Never   Tobacco comments:    Quit smoking more than 40 years ago  Substance Use Topics   Alcohol use: Yes    Comment: Rarely      Family History  Problem Relation Age of Onset  Heart attack Mother 11       MI   Alzheimer's disease Father      Allergies  Allergen Reactions   Ivp Dye [Iodinated Contrast Media]    Simvastatin     REACTION: joint pains   Soap Itching    fragrant soaps     REVIEW OF SYSTEMS (Negative unless checked)  Constitutional: [] Weight loss  [] Fever  [] Chills Cardiac: [] Chest pain   [] Chest pressure   [] Palpitations   [] Shortness of breath when laying flat   [] Shortness of breath at rest   [] Shortness of breath with exertion. Vascular:  [] Pain in legs with walking   [] Pain in legs at rest   [] Pain in legs when laying flat   [] Claudication   [] Pain in feet when walking  [] Pain in feet at rest  [] Pain in feet when laying flat   [] History of DVT   [] Phlebitis   [] Swelling in legs   [] Varicose veins    [] Non-healing ulcers Pulmonary:   [] Uses home oxygen   [] Productive cough   [] Hemoptysis   [] Wheeze  [] COPD   [] Asthma Neurologic:  [] Dizziness  [] Blackouts   [] Seizures   [] History of stroke   [] History of TIA  [] Aphasia   [] Temporary blindness   [] Dysphagia   [] Weakness or numbness in arms   [] Weakness or numbness in legs Musculoskeletal:  [x] Arthritis   [] Joint swelling   [x] Joint pain   [] Low back pain Hematologic:  [] Easy bruising  [] Easy bleeding   [] Hypercoagulable state   [] Anemic   Gastrointestinal:  [] Blood in stool   [] Vomiting blood  [] Gastroesophageal reflux/heartburn   [] Abdominal pain Genitourinary:  [] Chronic kidney disease   [] Difficult urination  [] Frequent urination  [] Burning with urination   [] Hematuria Skin:  [] Rashes   [] Ulcers   [] Wounds Psychological:  [] History of anxiety   []  History of major depression.  Physical Examination  BP (!) 166/84    Pulse 99    Ht 5\' 6"  (1.676 m)    Wt 162 lb (73.5 kg)    BMI 26.15 kg/m  Gen:  WD/WN, NAD. Appears younger than stated age. Head: [place]/AT, No temporalis wasting. Ear/Nose/Throat: Hearing grossly intact, nares w/o erythema or drainage Eyes: Conjunctiva clear. Sclera non-icteric Neck: Supple.  Trachea midline Pulmonary:  Good air movement, no use of accessory muscles.  Cardiac: RRR, no JVD Vascular:  Vessel Right Left  Radial Palpable Palpable                      Popliteal Palpable Enlarged  PT Palpable Palpable  DP Palpable Palpable   Gastrointestinal: soft, non-tender/non-distended. No guarding/reflex.  Musculoskeletal: M/S 5/5 throughout.  No deformity or atrophy. Mild LE edema. Neurologic: Sensation grossly intact in extremities.  Symmetrical.  Speech is fluent.  Psychiatric: Judgment intact, Mood & affect appropriate for pt's clinical situation. Dermatologic: No rashes or ulcers noted.  No cellulitis or open wounds.      Labs No results found for this or any previous visit (from the past 2160  hour(s)).  Radiology No results found.  Assessment/Plan  AAA (abdominal aortic aneurysm) without rupture (HCC) His duplex today shows a stable 3.8 cm aortic sac and a patent stent graft without endoleak.  Doing well status postrepair.  Continue to follow annually.  Essential hypertension blood pressure control important in reducing the progression of atherosclerotic disease and AAA growth. On appropriate oral medications.  Hyperlipidemia lipid control important in reducing the progression of atherosclerotic disease. Continue statin therapy  Popliteal aneurysm (HCC) Duplex today shows a reasonably stable 1.1 cm right popliteal artery aneurysm and a 2.1 cm left popliteal artery aneurysm.  No claudication or ischemic symptoms at this time.  Given his advanced age, we will going to continue to monitor this although we are approaching the size needed for prophylactic repair on the left.  Recheck in 6 months.    Leotis Pain, MD  04/02/2021 11:27 AM    This note was created with Dragon medical transcription system.  Any errors from dictation are purely unintentional

## 2021-05-14 ENCOUNTER — Emergency Department: Payer: Medicare Other

## 2021-05-14 ENCOUNTER — Encounter: Payer: Self-pay | Admitting: Emergency Medicine

## 2021-05-14 ENCOUNTER — Other Ambulatory Visit: Payer: Self-pay

## 2021-05-14 ENCOUNTER — Emergency Department
Admission: EM | Admit: 2021-05-14 | Discharge: 2021-05-14 | Disposition: A | Discharge status: Home / Self Care | Payer: Medicare Other | Attending: Emergency Medicine | Admitting: Emergency Medicine

## 2021-05-14 DIAGNOSIS — R0789 Other chest pain: Secondary | ICD-10-CM | POA: Insufficient documentation

## 2021-05-14 DIAGNOSIS — I10 Essential (primary) hypertension: Secondary | ICD-10-CM | POA: Diagnosis not present

## 2021-05-14 LAB — BASIC METABOLIC PANEL
Anion gap: 8 (ref 5–15)
BUN: 20 mg/dL (ref 8–23)
CO2: 26 mmol/L (ref 22–32)
Calcium: 9.3 mg/dL (ref 8.9–10.3)
Chloride: 107 mmol/L (ref 98–111)
Creatinine, Ser: 1.02 mg/dL (ref 0.61–1.24)
GFR, Estimated: >60 mL/min (ref >60)
Glucose, Bld: 126 mg/dL — ABNORMAL HIGH (ref 70–99)
Potassium: 4.4 mmol/L (ref 3.5–5.1)
Sodium: 141 mmol/L (ref 135–145)

## 2021-05-14 LAB — CBC
HCT: 44.8 % (ref 39.0–52.0)
Hemoglobin: 14.3 g/dL (ref 13.0–17.0)
MCH: 30.1 pg (ref 26.0–34.0)
MCHC: 31.9 g/dL (ref 30.0–36.0)
MCV: 94.3 fL (ref 80.0–100.0)
Platelets: 181 10*3/uL (ref 150–400)
RBC: 4.75 MIL/uL (ref 4.22–5.81)
RDW: 12.8 % (ref 11.5–15.5)
WBC: 7.7 10*3/uL (ref 4.0–10.5)
nRBC: 0 % (ref 0.0–0.2)

## 2021-05-14 LAB — TROPONIN I (HIGH SENSITIVITY)
Troponin I (High Sensitivity): 5 ng/L (ref <18)
Troponin I (High Sensitivity): 5 ng/L (ref <18)

## 2021-05-14 NOTE — ED Triage Notes (Signed)
C/O right chest, flank pain radiating to right arm since last night. ?

## 2021-05-14 NOTE — Discharge Instructions (Addendum)
-  Continue to monitor your symptoms.  Follow-up with your primary care provider in 1 week ?-Return to the emergency department at any time if you begin to experience any new or worsening symptoms. ?

## 2021-05-14 NOTE — ED Notes (Signed)
RN to bedside to introduce self to pt.  

## 2021-05-14 NOTE — ED Provider Notes (Signed)
? ?[facility] ?Provider Note ? ? ? Event Date/Time  ? First MD Initiated Contact with Patient 05/14/21 1355   ?  (approximate) ? ? ?History  ? ?Chief Complaint ?Chest Pain ? ? ?HPI ?Jerry [name] is a 88 y.o. male, history of hyperlipidemia, AAA, hypertension, prediabetes, Raynaud's, nephrolithiasis, presents to the emergency department for evaluation of chest pain.  Patient states that he began experiencing a " electric" type sensation intermittently that began yesterday.  Patient states he does remember exactly when it started.  He states that he notices a sensation on the right side of his chest with extension into his right elbow.  He states that he does not currently feel the sensation or any pain at this time, but will come and go.  Denies fever/chills, shortness of breath, back pain, abdominal pain, urinary symptoms, rashes/lesions, lightheadedness/dizziness, or headache. ? ?History Limitations: No limitations. ? ?  ? ? ?Physical Exam  ?Triage Vital Signs: ?ED Triage Vitals [05/14/21 1336]  ?Enc Vitals Group  ?   BP   ?   Pulse   ?   Resp   ?   Temp   ?   Temp src   ?   SpO2   ?   Weight 162 lb 0.6 oz (73.5 kg)  ?   Height 5\' 6"  (1.676 m)  ?   Head Circumference   ?   Peak Flow   ?   Pain Score   ?   Pain Loc   ?   Pain Edu?   ?   Excl. in GC?   ? ? ?Most recent vital signs: ?Vitals:  ? 05/14/21 1550 05/14/21 1630  ?BP: (!) 144/75 128/67  ?Pulse: 63 62  ?Resp: 18 16  ?SpO2: 100% 99%  ? ? ?General: Awake, NAD.  ?Skin: Warm, dry.  ?CV: Good peripheral perfusion.  S1 and S2 present.  No murmurs, rubs, or gallops. ?Resp: Normal effort.  Lung sounds clear bilaterally in the apices and bases. ?Abd: Soft, non-tender. No distention.  ?Neuro: At baseline. No gross neurological deficits.  ?Other: No chest wall tenderness.  Pain is not elicited with range of motion of the right upper extremity. ? ?Physical Exam ? ? ? ?ED Results / Procedures / Treatments  ?Labs ?(all labs ordered are listed,  but only abnormal results are displayed) ?Labs Reviewed  ?BASIC METABOLIC PANEL - Abnormal; Notable for the following components:  ?    Result Value  ? Glucose, Bld 126 (*)   ? All other components within normal limits  ?CBC  ?TROPONIN I (HIGH SENSITIVITY)  ?TROPONIN I (HIGH SENSITIVITY)  ? ? ? ?EKG ?Sinus rhythm, rate of 69, no ST segment changes, no AV blocks, no axis deviations, normal QRS interval. ? ? ?RADIOLOGY ? ?ED Provider Interpretation: I personally reviewed and interpreted this chest x-ray, no evidence of acute cardiopulmonary process. ? ?DG Chest 2 View ? ?Result Date: 05/14/2021 ?CLINICAL DATA:  Right-sided chest pain. Flank pain radiating to the right arm. EXAM: CHEST - 2 VIEW COMPARISON:  Chest two views 10/18/2018 FINDINGS: Cardiac silhouette and mediastinal contours are within normal limits. Mild calcification within the aortic arch. Unchanged chronic bilateral mid and lower lung interstitial thickening and linear chronic scarring. Likely greatest within the middle lobe and lingula. No new airspace opacity. No pleural effusion or pneumothorax. Unchanged flattening of the diaphragms. Mild-to-moderate multilevel degenerative disc changes of the thoracic spine. A vascular stent again overlies the upper abdomen, possibly within the aorta.  IMPRESSION: Chronic hyperinflation and bibasilar scarring. No acute lung process. Electronically Signed   By: Neita Garnet M.D.   On: 05/14/2021 14:00   ? ?PROCEDURES: ? ?Critical Care performed: None. ? ?Procedures ? ? ? ?MEDICATIONS ORDERED IN ED: ?Medications - No data to display ? ? ?IMPRESSION / MDM / ASSESSMENT AND PLAN / ED COURSE  ?I reviewed the triage vital signs and the nursing notes. ?             ?               ? ? ?Differential diagnosis includes, but is not limited to, ACS, aortic dissection, myocarditis/pericarditis, costochondritis, thoracic radiculopathy, pneumonia. ? ?ED Course ?Patient appears well.  Vital signs within normal limits.  NAD. ? ?CBC  shows no leukocytosis or anemia.  BMP unremarkable.  No evidence of electrolyte abnormalities or kidney injury. ? ?EKG unremarkable.  See above for details.  Initial troponin 5.  Second troponin 5.  Unlikely ACS or myocarditis/pericarditis. ? ?Chest x-ray shows no acute cardiopulmonary process.  See above for additional findings. ? ?Assessment/Plan ?Patient presents with atypical chest pain.  EKG has been unremarkable for ischemic process..  Lab work is been reassuring.  No acute cardiopulmonary process on chest x-ray.  Low suspicion for any occult serious or life-threatening pathology requiring advanced imaging given his stable presentation.  Patient is currently asymptomatic at this time.  We will plan to discharge this patient.  Advised him to follow-up with his primary care provider in 1 week to see if his symptoms are persisting. ? ?Considered admission for this patient, but given his stable presentation, unremarkable work-up, he is unlikely to benefit. ? ?Patient was provided with anticipatory guidance, return precautions, and educational material. Encouraged the patient to return to the emergency department at any time if they begin to experience any new or worsening symptoms.  ? ?  ? ? ?FINAL CLINICAL IMPRESSION(S) / ED DIAGNOSES  ? ?Final diagnoses:  ?Atypical chest pain  ? ? ? ?Rx / DC Orders  ? ?ED Discharge Orders   ? ? None  ? ?  ? ? ? ?Note:  This document was prepared using Dragon voice recognition software and may include unintentional dictation errors. ?  ?Varney Daily, [place] ?05/14/21 2011 ? ?  ?Phineas Semen, MD ?05/14/21 2159 ? ?

## 2021-08-22 ENCOUNTER — Inpatient Hospital Stay: Admit: 2021-08-22 | Discharge: 2021-08-22 | Disposition: A | Discharge status: Home / Self Care | Payer: MEDICARE

## 2021-08-22 ENCOUNTER — Emergency Department: Admit: 2021-08-22 | Payer: MEDICARE

## 2021-08-22 DIAGNOSIS — J189 Pneumonia, unspecified organism: Secondary | ICD-10-CM

## 2021-08-22 LAB — CBC WITH AUTO DIFFERENTIAL
Absolute Baso #: 0 10*3/uL (ref 0.0–0.2)
Absolute Eos #: 0 10*3/uL (ref 0.0–0.5)
Absolute Lymph #: 0.4 10*3/uL — ABNORMAL LOW (ref 1.0–3.2)
Absolute Mono #: 1 10*3/uL (ref 0.3–1.0)
Basophils %: 0.1 % (ref 0.0–2.0)
Eosinophils %: 0.3 % (ref 0.0–7.0)
Hematocrit: 42.6 % (ref 38.0–52.0)
Hemoglobin: 14.1 g/dL (ref 13.0–17.3)
Immature Grans (Abs): 0.02 10*3/uL (ref 0.00–0.06)
Immature Granulocytes: 0.1 % (ref 0.0–0.6)
Lymphocytes: 2.7 % — ABNORMAL LOW (ref 15.0–45.0)
MCH: 31.1 pg (ref 27.0–34.5)
MCHC: 33.1 g/dL (ref 32.0–36.0)
MCV: 94 fL (ref 84.0–100.0)
MPV: 9 fL (ref 7.2–13.2)
Monocytes: 7.2 % (ref 4.0–12.0)
Neutrophils %: 89.6 % — ABNORMAL HIGH (ref 42.0–74.0)
Neutrophils Absolute: 12.1 10*3/uL — ABNORMAL HIGH (ref 1.6–7.3)
Platelets: 191 10*3/uL (ref 140–440)
RBC: 4.53 x10e6/mcL (ref 4.00–5.60)
RDW: 12.2 % (ref 11.0–16.0)
WBC: 13.5 10*3/uL — ABNORMAL HIGH (ref 3.8–10.6)

## 2021-08-22 LAB — COVID-19 & INFLUENZA COMBO (LIAT HOSPITAL)
INFLUENZA A: NOT DETECTED
INFLUENZA B: NOT DETECTED
SARS-CoV-2: NOT DETECTED

## 2021-08-22 LAB — C. DIFFICILE TOXIN A/B
C Diff Toxin Interpretation: NEGATIVE
C.diff Toxin/Antigen: NEGATIVE

## 2021-08-22 LAB — URINALYSIS W/ RFLX MICROSCOPIC
Bilirubin Urine: NEGATIVE
Glucose, UA: NEGATIVE
Leukocyte Esterase, Urine: NEGATIVE
Nitrite, Urine: NEGATIVE
Protein, UA: NEGATIVE
Specific Gravity, UA: 1.02 (ref 1.003–1.035)
Urobilinogen, Urine: 1 EU/dL
pH, UA: 6 (ref 4.5–8.0)

## 2021-08-22 LAB — COMPREHENSIVE METABOLIC PANEL
ALT: 24 U/L (ref 0–50)
AST: 28 U/L (ref 0–50)
Albumin/Globulin Ratio: 1 (ref 1.00–2.70)
Albumin: 3.6 g/dL (ref 3.5–5.2)
Alk Phosphatase: 132 U/L — ABNORMAL HIGH (ref 40–130)
Anion Gap: 11 mmol/L (ref 2–17)
BUN: 26 mg/dL — ABNORMAL HIGH (ref 8–23)
CO2: 22 mmol/L (ref 22–29)
Calcium: 9.3 mg/dL (ref 8.8–10.2)
Chloride: 102 mmol/L (ref 98–107)
Creatinine: 0.9 mg/dL (ref 0.7–1.3)
Est, Glom Filt Rate: 81 mL/min/1.73m (ref >=60)
Globulin: 3 g/dL (ref 1.9–4.4)
Glucose: 158 mg/dL — ABNORMAL HIGH (ref 70–99)
OSMOLALITY CALCULATED: 278 mOsm/kg (ref 270–287)
Potassium: 4.6 mmol/L (ref 3.5–5.3)
Sodium: 135 mmol/L (ref 135–145)
Total Bilirubin: 0.44 mg/dL (ref 0.00–1.20)
Total Protein: 6.1 g/dL — ABNORMAL LOW (ref 6.4–8.3)

## 2021-08-22 LAB — EKG 12-LEAD
P Axis: -57 degrees
P-R Interval: 164 ms
Q-T Interval: 328 ms
QRS Duration: 96 ms
QTc Calculation (Bazett): 363 ms
R Axis: 67 degrees
T Axis: 42 degrees
Ventricular Rate: 79 {beats}/min

## 2021-08-22 LAB — CULTURE, URINE: FINAL REPORT: NO GROWTH

## 2021-08-22 LAB — CULTURE, STOOL

## 2021-08-22 LAB — MAGNESIUM: Magnesium: 1.8 mg/dL (ref 1.6–2.6)

## 2021-08-22 LAB — TROPONIN: Troponin T: <0.01 ng/mL (ref 0.000–0.010)

## 2021-08-22 MED ORDER — SODIUM CHLORIDE 0.9 % IV BOLUS
0.9 % | Freq: Once | INTRAVENOUS | Status: AC
Start: 2021-08-22 — End: 2021-08-22
  Administered 2021-08-22: 13:00:00 1000 mL via INTRAVENOUS

## 2021-08-22 MED ORDER — FLEET ENEMA 7-19 GM/118ML RE ENEM
7-19 GM/118ML | Freq: Once | RECTAL | Status: AC
Start: 2021-08-22 — End: 2021-08-22
  Administered 2021-08-22: 15:00:00 1 via RECTAL

## 2021-08-22 MED ORDER — AZITHROMYCIN 250 MG PO TABS
250 MG | Freq: Once | ORAL | Status: AC
Start: 2021-08-22 — End: 2021-08-22
  Administered 2021-08-22: 15:00:00 500 mg via ORAL

## 2021-08-22 MED ORDER — AMOXICILLIN-POT CLAVULANATE 875-125 MG PO TABS
875-125 MG | ORAL_TABLET | Freq: Two times a day (BID) | ORAL | 0 refills | Status: AC
Start: 2021-08-22 — End: 2021-09-01

## 2021-08-22 MED ORDER — AMOXICILLIN-POT CLAVULANATE 875-125 MG PO TABS
875-125 MG | Freq: Two times a day (BID) | ORAL | Status: DC
Start: 2021-08-22 — End: 2021-08-22
  Administered 2021-08-22: 16:00:00 1 via ORAL

## 2021-08-22 MED ORDER — AZITHROMYCIN 250 MG PO TABS
250 MG | ORAL_TABLET | Freq: Every day | ORAL | 0 refills | Status: AC
Start: 2021-08-22 — End: 2021-08-26

## 2021-08-22 MED FILL — AZITHROMYCIN 250 MG PO TABS: 250 MG | ORAL | Qty: 2

## 2021-08-22 MED FILL — AMOXICILLIN-POT CLAVULANATE 875-125 MG PO TABS: 875-125 MG | ORAL | Qty: 1

## 2021-08-22 NOTE — ED Notes (Signed)
Pt was on commode and when getting up to transfer to bed had an episode of weakness which required 2 person assist back to bed,pt states knees felt weak     Durenda Guthrie, RN  08/22/21 [phone]

## 2021-08-22 NOTE — ED Notes (Signed)
Pt had good results from [facility] feel better     Durenda Guthrie, RN  08/22/21 1159

## 2021-08-22 NOTE — ED Notes (Signed)
Attempted to contact pt concerning er visit yesterday; left message to call back if he has any questions; he may use this phone # today for call back     Jenell Milliner, RN  08/23/21 1438

## 2021-08-22 NOTE — ED Notes (Signed)
Pt ambulated about 136ft without any difficulty     Durenda Guthrie, RN  08/22/21 1001

## 2021-08-22 NOTE — ED Provider Notes (Signed)
[facility]  EMERGENCY DEPARTMENT ENCOUNTER      Pt Name: Aaron Randall  MRN: [id number]  [place] 05-Sep-1928  Date of evaluation: 08/22/2021  Provider: Baldo Daub, PA-C    CHIEF COMPLAINT       Chief Complaint   Patient presents with    Shortness of Breath     SOB and soft stool x2 since midnight,HA,heartburn         HISTORY OF PRESENT ILLNESS   (Location/Symptom, Timing/Onset, Context/Setting, Quality, Duration, Modifying Factors, Severity)  Note limiting factors.   Aaron Randall is a 88 y.o. male who presents to the emergency department for evaluation due to cute onset diarrhea, shortness of breath, and generalized weakness patient was concerned about getting sick, so he left the house in the middle of the night and went to the car in order to not get his wife sick.  There is no fall or trauma.  However the patient has had 3 soft stools since midnight.  He feels dehydrated and is very thirsty.  He has a past medical history of hyperlipidemia, hypertension, and AAA repair x2 in 2001 and 2018.  Denies any abdominal pain at this time.  No black or bright red blood per rectum.  Patient's family presents to the ER as well.  The patient reportedly had a episode of scratching after receiving dye prior to MRI.  Since then he has been treated with allergic protocol CTs and MRIs.  Patient's family is currently checking to see if this was definitively MRI or CT contrast.    Nursing Notes were reviewed.    REVIEW OF SYSTEMS    (2-9 systems for level 4, 10 or more for level 5)   Review of Systems   Constitutional:  Negative for chills and fever.   HENT:  Negative for congestion and sore throat.    Eyes:  Negative for photophobia and visual disturbance.   Respiratory:  Positive for shortness of breath. Negative for chest tightness.    Cardiovascular:  Negative for chest pain and palpitations.   Gastrointestinal:  Positive for diarrhea. Negative for abdominal pain.   Genitourinary:  Negative for dysuria and  hematuria.   Musculoskeletal:  Negative for arthralgias and myalgias.   Skin:  Negative for rash and wound.   Neurological:  Positive for weakness. Negative for numbness and headaches.   Psychiatric/Behavioral:  Negative for confusion and hallucinations.    All other systems reviewed and are negative.    Except as noted above the remainder of the review of systems was reviewed and negative.       PAST MEDICAL HISTORY     Past Medical History:   Diagnosis Date    Hyperlipidemia     Hypertension          SURGICAL HISTORY       Past Surgical History:   Procedure Laterality Date    ABDOMINAL AORTIC ANEURYSM REPAIR           CURRENT MEDICATIONS       Previous Medications    AMLODIPINE (NORVASC) 2.5 MG TABLET    Take 1 tablet by mouth daily    ASPIRIN 81 MG CHEWABLE TABLET    Take 1 tablet by mouth daily    ATORVASTATIN (LIPITOR) 40 MG TABLET    Take 1 tablet by mouth daily    FLUTICASONE-SALMETEROL (ADVAIR) 100-50 MCG/ACT AEPB DISKUS INHALER    Inhale 1 puff into the lungs in the morning and 1 puff in the evening.  METOPROLOL SUCCINATE (TOPROL XL) 25 MG EXTENDED RELEASE TABLET    Take 1 tablet by mouth daily    MULTIPLE VITAMINS-MINERALS (PRESERVISION AREDS 2 PO)    Take by mouth    PANTOPRAZOLE SODIUM (PROTONIX) 40 MG PACK PACKET    Take 1 packet by mouth every morning (before breakfast)       ALLERGIES     Iodinated contrast media    FAMILY HISTORY     No family history on file.       SOCIAL HISTORY       Social History     Socioeconomic History    Marital status: Married   Tobacco Use    Smoking status: Never    Smokeless tobacco: Never       SCREENINGS         Glasgow Coma Scale  Eye Opening: Spontaneous  Best Verbal Response: Oriented  Best Motor Response: Obeys commands  Glasgow Coma Scale Score: 15                     CIWA Assessment  BP: 139/61  Pulse: 83     CURB-65 Score  Confusion: No  BUN > 19 mg/dL (> 7 mmol/L): Yes  Respiratory Rate > 29: No  Systolic BP < 90 mmHg or Diastolic BP < 61 mmHg: No  Age is  65+: Yes  CURB-65 Score: 2     PORT Score  Respiratory Rate > 29: No     PHYSICAL EXAM    (up to 7 for level 4, 8 or more for level 5)     ED Triage Vitals [08/22/21 0806]   BP Temp Temp Source Pulse Respirations SpO2 Height Weight - Scale   139/61 97.6 F (36.4 C) Oral 83 16 98 % '5\' 6"'$  (1.676 m) 160 lb (72.6 kg)       Physical Exam  Vitals and nursing note reviewed.   Constitutional:       General: He is not in acute distress.     Appearance: Normal appearance. He is not ill-appearing, toxic-appearing or diaphoretic.   HENT:      Head: Normocephalic and atraumatic.      Nose: Nose normal.      Mouth/Throat:      Mouth: Mucous membranes are moist.      Pharynx: Oropharynx is clear.   Eyes:      Extraocular Movements: Extraocular movements intact.      Conjunctiva/sclera: Conjunctivae normal.   Cardiovascular:      Rate and Rhythm: Normal rate and regular rhythm.      Pulses: Normal pulses.      Heart sounds: Normal heart sounds. No murmur heard.  Pulmonary:      Effort: Pulmonary effort is normal. No tachypnea, accessory muscle usage or respiratory distress.      Breath sounds: Normal breath sounds. No stridor. No decreased breath sounds, wheezing, rhonchi or rales.      Comments: Lungs are clear to auscultation bilaterally.  No tachypnea or accessory muscle usage on exam.  Speaking in complete sentences.  Abdominal:      General: There is no distension.      Palpations: Abdomen is soft. There is no mass.      Tenderness: There is no abdominal tenderness. There is no guarding or rebound.      Comments: Patient abdomen nontender to palpation, mild distention, no palpated pulsating mass.   Genitourinary:     Comments: Rectal exam with mildly  loose rectal tone.  Soft stool on digital rectal exam.  No palpable stool ball.  Musculoskeletal:         General: No deformity. Normal range of motion.      Cervical back: Normal range of motion and neck supple.   Skin:     General: Skin is warm.      Coloration: Skin is not  jaundiced.   Neurological:      General: No focal deficit present.      Mental Status: He is alert and oriented to person, place, and time. Mental status is at baseline.      Cranial Nerves: Cranial nerves 2-12 are intact.      Sensory: Sensation is intact.      Motor: Motor function is intact.      Coordination: Coordination is intact.      Comments: Patient has some weakness with sitting up, requires assistance.       Psychiatric:         Mood and Affect: Mood normal.         Behavior: Behavior normal.         PROCEDURES:  Unless otherwise noted below, none     Procedures    DIAGNOSTIC RESULTS     EKG: All EKG's are interpreted by the Emergency Department Physician who either signs or Co-signs this chart in the absence of a cardiologist.      RADIOLOGY:   Non-plain film images such as CT, Ultrasound and MRI are read by the radiologist. Plain radiographic images are visualized and preliminarily interpreted by the emergency physician with the below findings:    Interpretation per the Radiologist below, if available at the time of this note:    CT ABDOMEN PELVIS WO CONTRAST Additional Contrast? None   Final Result      Limited evaluation of the solid organs in the absence of intravenous contrast.      Rectal stool ball measuring up to 6.7 cm in transverse dimension. No secondary    findings to suggest acute stercoral colitis at this time.      Consolidative and groundglass opacities in the right lower lobe, suspicious for    aspiration and/or pneumonia. Recommend clinical correlation.      Postprocedural changes of aortobiiliac stent grafting. Excluded aneurysm sac    size as above. No priors are available for comparison. Patency not evaluated by    noncontrast examination.       Nonspecific mixed sclerotic and lucent appearance of the proximal left femur,    possibly hemorrhagic disease. Recommend correlation with prior imaging.      Additional findings as above.                        XR CHEST PORTABLE   Final  Result      Patchy bibasilar opacities, right greater than left are nonspecific and could    reflect atelectasis versus pneumonia in the appropriate clinical context.      Increased lucency of the bilateral upper lungs is suggestive of emphysema.                   LABS:  Labs Reviewed   CBC WITH AUTO DIFFERENTIAL - Abnormal; Notable for the following components:       Result Value    WBC 13.5 (*)     Neutrophils % 89.6 (*)     Lymphocytes 2.7 (*)     Neutrophils Absolute 12.1 (*)  Absolute Lymph # 0.4 (*)     All other components within normal limits   COMPREHENSIVE METABOLIC PANEL - Abnormal; Notable for the following components:    Glucose 158 (*)     BUN 26 (*)     Total Protein 6.1 (*)     Alk Phosphatase 132 (*)     All other components within normal limits   URINALYSIS W/ RFLX MICROSCOPIC - Abnormal; Notable for the following components:    Ketones, Urine Trace (*)     All other components within normal limits   C. DIFFICILE TOXIN A/B   COVID-19 & INFLUENZA COMBO [facility])    Narrative:     Is this test for diagnosis or screening?->Diagnosis of ill patient  Symptomatic for COVID-19 as defined by CDC?->Unknown  Date of Symptom Onset->N/A  Hospitalized for COVID-19?->No  Admitted to ICU for COVID-19?->No  Pregnant:->No   CULTURE, STOOL   CULTURE, URINE   TROPONIN   MAGNESIUM       All other labs were within normal range or not returned as of this dictation.    EMERGENCY DEPARTMENT COURSE and DIFFERENTIAL DIAGNOSIS/MDM:   Vitals:    Vitals:    08/22/21 0806   BP: 139/61   Pulse: 83   Resp: 16   Temp: 97.6 F (36.4 C)   TempSrc: Oral   SpO2: 98%   Weight: 72.6 kg   Height: '5\' 6"'$  (1.676 m)       Medical Decision Making  88 year old male presents ER today for evaluation due to cute onset illness, and generalized weakness.  Patient reports some shortness of breath which has since resolved, multiple episodes of diarrhea, and fatigue.  He is in no acute distress, non-ill-appearing, nontoxic-appearing, with  stable vital signs on exam.  Afebrile, nontachycardic and nontachypneic.  Patient is mildly weak on exam, requiring assistance to sit up.  We will do a comprehensive work-up cardiac abnormalities, electrolyte abnormalities, dehydration, UTI, and C. difficile.    Amount and/or Complexity of Data Reviewed  Independent Historian: spouse  Labs: ordered. Decision-making details documented in ED Course.  Radiology: ordered and independent interpretation performed. Decision-making details documented in ED Course.  ECG/medicine tests: ordered and independent interpretation performed. Decision-making details documented in ED Course.    Risk  OTC drugs.  Prescription drug management.        REASSESSMENT     ED Course as of 08/22/21 1135   Sun Aug 22, 2021   0850 WBC(!): 13.5 [RL]   0850 Neutrophils %(!): 89.6 [RL]   0850 Neutrophils Absolute(!): 12.1 [RL]   0858 BUN,BUNPL(!): 26 [RL]   0858 Alk Phos(!): 132 [RL]   0858 Sodium: 135 [RL]   0858 Potassium: 4.6 [RL]   0858 Chloride: 102 [RL]   0858 CO2: 22 [RL]   1751 EKG 12 Lead  EKG shows normal sinus rhythm at a rate of 79 bpm.  No ST elevations, ST depressions, T wave abnormalities.  EKG interpreted by me. [RL]   0900 Magnesium: 1.8 [RL]   0900 Troponin T: <0.010 [RL]   0907 XR CHEST PORTABLE  Chest x-ray preliminary interpretation by me shows mild haziness bilaterally.  Pending radiology confirmation. [RL]   [id number] Nitrite, Urine: Negative [RL]   0939 Leukocyte Esterase, Urine: Negative [RL]   0939 Specific Gravity, UA: 1.020 [RL]   0258 COVID-19 & Influenza Combo [facility]):    SARS-CoV-2 Not Detected   INFLUENZA A Not Detected   INFLUENZA B Not Detected [RL]  0940 XR CHEST PORTABLE [RL]   0948 C Diff Toxin [facility]:    C Diff Toxin Interpretation Negative for C difficle Toxin.   C.diff Toxin/Antigen Negative [RL]   0948 CT ABDOMEN PELVIS WO CONTRAST Additional Contrast? None  CT initially interpreted by me, findings consistent with colitis, pending radiology confirmation.  [RL]   1009 CT ABDOMEN PELVIS WO CONTRAST Additional Contrast? None  Rectal stool ball.  Probable pneumonia of the lower lungs. [RL]   1113 Patient walked around the ER with no weakness noted assistance, by nursing.  He feels strong enough to go home.  The patient and family were offered admission which they declined at this time.  Thorough discussion with the family and patient in regards to possible aspiration pneumonia.  Patient will be started on treatment in the ER with p.o. tablets.  Based on CT reading, we will perform a rectal exam and attempt at disimpaction.  Stool was found to be soft, we will perform fleets enema.  We will discharge the patient home on antibiotics for aspiration pneumonia.  Strict return precautions are discussed, if the patient's weakness returns, he is to return to the ER for admission.  Otherwise close follow-up with primary care.  Patient will have plenty of help at home as family is visiting from out of town, and he lives with his wife.  He was negative on C. difficile testing. [RL]   6073 XTGG 26 screening is 2, discussion with the family who is comfortable taking the patient home, return if symptoms change or worsen.  Patient was given 1 L of fluids in the ER. [RL]      ED Course User Index  [RL] Baldo Daub, PA-C               FINAL IMPRESSION      1. Pneumonia of both lungs due to infectious organism, unspecified part of lung    2. Generalized weakness    3. Diarrhea, unspecified type          DISPOSITION/PLAN   DISPOSITION Decision To Discharge 08/22/2021 11:16:49 AM      PATIENT REFERRED TO:  primary care provider    In 3 days      EMERGENCY ROOM      As needed, If symptoms worsen      DISCHARGE MEDICATIONS:  New Prescriptions    AMOXICILLIN-CLAVULANATE (AUGMENTIN) 875-125 MG PER TABLET    Take 1 tablet by mouth 2 times daily for 10 days    AZITHROMYCIN (ZITHROMAX) 250 MG TABLET    Take 1 tablet by mouth daily for 4 days     Controlled Substances Monitoring:     No  flowsheet data found.    (Please note that portions of this note were completed with a voice recognition program.  Efforts were made to edit the dictations but occasionally words are mis-transcribed.)    Baldo Daub, PA-C (electronically signed)  Attending Emergency Physician           Baldo Daub, PA-C  08/22/21 1135

## 2021-08-22 NOTE — Discharge Instructions (Signed)
Please use antibiotics at home as prescribed.  Stay hydrated as we discussed.  If weakness returns, return to the ER immediately for further treatment and evaluation.

## 2021-08-25 NOTE — Telephone Encounter (Signed)
ER  Follow Up Project - Patient scheduled outside of [facility]

## 2021-10-01 ENCOUNTER — Encounter (INDEPENDENT_AMBULATORY_CARE_PROVIDER_SITE_OTHER): Payer: Self-pay | Admitting: Vascular Surgery

## 2021-10-01 ENCOUNTER — Ambulatory Visit (INDEPENDENT_AMBULATORY_CARE_PROVIDER_SITE_OTHER): Payer: Medicare Other | Admitting: Vascular Surgery

## 2021-10-01 ENCOUNTER — Ambulatory Visit (INDEPENDENT_AMBULATORY_CARE_PROVIDER_SITE_OTHER): Payer: Medicare Other

## 2021-10-01 VITALS — BP 137/75 | HR 76 | Resp 16 | Wt 157.8 lb

## 2021-10-01 DIAGNOSIS — E785 Hyperlipidemia, unspecified: Secondary | ICD-10-CM | POA: Diagnosis not present

## 2021-10-01 DIAGNOSIS — I7143 Infrarenal abdominal aortic aneurysm, without rupture: Secondary | ICD-10-CM

## 2021-10-01 DIAGNOSIS — I10 Essential (primary) hypertension: Secondary | ICD-10-CM | POA: Diagnosis not present

## 2021-10-01 DIAGNOSIS — I724 Aneurysm of artery of lower extremity: Secondary | ICD-10-CM

## 2021-10-01 NOTE — Progress Notes (Signed)
MRN : [id number]  Jerry [name] is a 88 y.o. (Apr 04, 1928) male who presents with chief complaint of  Chief Complaint  Patient presents with   Follow-up    Ultrasound follow up  .  History of Present Illness: Patient returns today in follow up of multiple vascular issues.  He has known bilateral popliteal aneurysms.  He does have some chronic lower extremity swelling but this is fairly mild and well-controlled at this point.  No signs of peripheral embolization, rest pain, or claudication.  Duplex today shows these to be stable at 1.3 cm in maximal diameter on the right and 2.1 cm in maximal diameter on the left. He is also status post abdominal aortic aneurysm repair with a stent graft.  No back or abdominal pain or signs of peripheral embolization. Aortic sac size has decreased down to 3.0 cm in maximal diameter without evidence of endoleak.  Current Outpatient Medications  Medication Sig Dispense Refill   amLODipine (NORVASC) 2.5 MG tablet Take 1 tablet by mouth daily.     aspirin 81 MG EC tablet Take 81 mg by mouth daily.       atorvastatin (LIPITOR) 40 MG tablet Take 40 mg by mouth daily.       chlorproMAZINE (THORAZINE) 25 MG tablet Take 1 tablet (25 mg total) by mouth 3 (three) times daily as needed for hiccoughs. 21 tablet 0   fluticasone-salmeterol (ADVAIR) 100-50 MCG/ACT AEPB Inhale into the lungs.     Multiple Vitamins-Minerals (CENTRUM) tablet Take 1 tablet by mouth daily.       Multiple Vitamins-Minerals (ICAPS) TABS Take 1 tablet by mouth 2 (two) times daily.       pantoprazole (PROTONIX) 40 MG tablet Take 40 mg by mouth daily.     metoprolol tartrate (LOPRESSOR) 25 MG tablet Take 1 tablet (25 mg total) by mouth 2 (two) times daily. (Patient not taking: Reported on 10/01/2021) 60 tablet 6   No current facility-administered medications for this visit.    Past Medical History:  Diagnosis Date   AAA (abdominal aortic aneurysm) (HCC)    Aneurysm (HCC)    History of  multiple vascular aneurysms. [name] involve the aorta, te iliac arteries, and the popliteal arteries.  The pt is s/p stent graft of an abdominal aortic aneurysm. All of his aneurysm follow up as been done at [facility].   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 This is manifested and is elevated, sed rate, pleuritis, Raynaud's pehnomenon and she does have a positive rheumatoid factor.  He is being treated with Plquenil for his collagen vascular disease   Colonic polyp    History of nephrolithiasis    Hyperlipidemia    Hypertension    Macular degeneration    Osteoarthrosis, hip    Left hip   Paget's disease     Past Surgical History:  Procedure Laterality Date   ABDOMINAL AORTIC ANEURYSM REPAIR     DOPPLER ECHOCARDIOGRAPHY  2006   Ef greater than 60%. There are no regional wall motion abnormalities. There was mild mitral regurgitation, there is no aortic stenosis, aortic valve was mildly calcified   HERNIA REPAIR  1998   Lung Hamartoma       Social History   Tobacco Use   Smoking status: Former   Smokeless tobacco: Never   Tobacco comments:    Quit smoking more than 40 years ago  Substance Use Topics   Alcohol use: Yes  Comment: Rarely       Family History  Problem Relation Age of Onset   Heart attack Mother 71       MI   Alzheimer's disease Father   No bleeding or clotting disorders  Allergies  Allergen Reactions   Ivp Dye [Iodinated Contrast Media]    Simvastatin     REACTION: joint pains   Soap Itching    fragrant soaps     REVIEW OF SYSTEMS (Negative unless checked)  Constitutional: [] Weight loss  [] Fever  [] Chills Cardiac: [] Chest pain   [] Chest pressure   [] Palpitations   [] Shortness of breath when laying flat   [] Shortness of breath at rest   [] Shortness of breath with exertion. Vascular:  [] Pain in legs with walking   [] Pain in legs at rest   [] Pain in legs when laying flat   [] Claudication   [] Pain in feet when  walking  [] Pain in feet at rest  [] Pain in feet when laying flat   [] History of DVT   [] Phlebitis   [] Swelling in legs   [] Varicose veins   [] Non-healing ulcers Pulmonary:   [] Uses home oxygen   [] Productive cough   [] Hemoptysis   [] Wheeze  [] COPD   [] Asthma Neurologic:  [] Dizziness  [] Blackouts   [] Seizures   [] History of stroke   [] History of TIA  [] Aphasia   [] Temporary blindness   [] Dysphagia   [] Weakness or numbness in arms   [] Weakness or numbness in legs Musculoskeletal:  [x] Arthritis   [] Joint swelling   [x] Joint pain   [] Low back pain Hematologic:  [] Easy bruising  [] Easy bleeding   [] Hypercoagulable state   [] Anemic   Gastrointestinal:  [] Blood in stool   [] Vomiting blood  [] Gastroesophageal reflux/heartburn   [] Abdominal pain Genitourinary:  [] Chronic kidney disease   [] Difficult urination  [] Frequent urination  [] Burning with urination   [] Hematuria Skin:  [] Rashes   [] Ulcers   [] Wounds Psychological:  [] History of anxiety   []  History of major depression.  Physical Examination  BP 137/75 (BP Location: Right Arm)   Pulse 76   Resp 16   Wt 157 lb 12.8 oz (71.6 kg)   BMI 25.47 kg/m  Gen:  WD/WN, NAD Head: [place]/AT, No temporalis wasting. Ear/Nose/Throat: Hearing grossly intact, nares w/o erythema or drainage Eyes: Conjunctiva clear. Sclera non-icteric Neck: Supple.  Trachea midline Pulmonary:  Good air movement, no use of accessory muscles.  Cardiac: RRR, no JVD Vascular:  Vessel Right Left  Radial Palpable Palpable                          PT Palpable Palpable  DP Palpable Palpable   Gastrointestinal: soft, non-tender/non-distended. No guarding/reflex.  Musculoskeletal: M/S 5/5 throughout.  No deformity or atrophy. Mild BLE edema. Neurologic: Sensation grossly intact in extremities.  Symmetrical.  Speech is fluent.  Psychiatric: Judgment intact, Mood & affect appropriate for pt's clinical situation. Dermatologic: No rashes or ulcers noted.  No cellulitis or open  wounds.      Labs No results found for this or any previous visit (from the past 2160 hour(s)).  Radiology No results found.  Assessment/Plan Essential hypertension blood pressure control important in reducing the progression of atherosclerotic disease and AAA growth. On appropriate oral medications.   Hyperlipidemia lipid control important in reducing the progression of atherosclerotic disease. Continue statin therapy  AAA (abdominal aortic aneurysm) without rupture (HCC) Aortic sac size has decreased down to 3.0 cm in maximal diameter without evidence of  endoleak.  Doing well.  Follow-up with duplex at his next visit.  Popliteal aneurysm (HCC) Duplex today shows these to be stable at 1.3 cm in maximal diameter on the right and 2.1 cm in maximal diameter on the left.  These are asymptomatic and given his advanced age we will continue to monitor them until they reach 2.5 cm in maximal diameter.  At that point we would consider a stent graft repair from an antegrade approach due to his previous abdominal aortic stent graft.  Recheck in 6 months.    Festus Barren, MD  10/01/2021 9:27 AM    This note was created with Dragon medical transcription system.  Any errors from dictation are purely unintentional

## 2021-10-01 NOTE — Assessment & Plan Note (Signed)
Duplex today shows these to be stable at 1.3 cm in maximal diameter on the right and 2.1 cm in maximal diameter on the left.  These are asymptomatic and given his advanced age we will continue to monitor them until they reach 2.5 cm in maximal diameter.  At that point we would consider a stent graft repair from an antegrade approach due to his previous abdominal aortic stent graft.  Recheck in 6 months.

## 2021-10-01 NOTE — Assessment & Plan Note (Signed)
Aortic sac size has decreased down to 3.0 cm in maximal diameter without evidence of endoleak.  Doing well.  Follow-up with duplex at his next visit.

## 2021-12-20 ENCOUNTER — Encounter (INDEPENDENT_AMBULATORY_CARE_PROVIDER_SITE_OTHER): Payer: Self-pay

## 2022-04-15 ENCOUNTER — Encounter (INDEPENDENT_AMBULATORY_CARE_PROVIDER_SITE_OTHER): Payer: Medicare Other

## 2022-04-15 ENCOUNTER — Ambulatory Visit (INDEPENDENT_AMBULATORY_CARE_PROVIDER_SITE_OTHER): Payer: Medicare Other | Admitting: Vascular Surgery

## 2022-04-15 ENCOUNTER — Other Ambulatory Visit (INDEPENDENT_AMBULATORY_CARE_PROVIDER_SITE_OTHER): Payer: Medicare Other

## 2022-05-03 ENCOUNTER — Ambulatory Visit (INDEPENDENT_AMBULATORY_CARE_PROVIDER_SITE_OTHER): Payer: Medicare Other | Admitting: Vascular Surgery

## 2022-05-03 ENCOUNTER — Ambulatory Visit (INDEPENDENT_AMBULATORY_CARE_PROVIDER_SITE_OTHER): Payer: Medicare Other

## 2022-05-03 ENCOUNTER — Encounter (INDEPENDENT_AMBULATORY_CARE_PROVIDER_SITE_OTHER): Payer: Self-pay | Admitting: Vascular Surgery

## 2022-05-03 VITALS — BP 157/78 | HR 70 | Resp 16 | Wt 165.4 lb

## 2022-05-03 DIAGNOSIS — I10 Essential (primary) hypertension: Secondary | ICD-10-CM

## 2022-05-03 DIAGNOSIS — E785 Hyperlipidemia, unspecified: Secondary | ICD-10-CM

## 2022-05-03 DIAGNOSIS — I7143 Infrarenal abdominal aortic aneurysm, without rupture: Secondary | ICD-10-CM

## 2022-05-03 DIAGNOSIS — I724 Aneurysm of artery of lower extremity: Secondary | ICD-10-CM | POA: Diagnosis not present

## 2022-05-03 NOTE — Assessment & Plan Note (Signed)
Duplex today demonstrates right popliteal aneurysm measuring 1.28 cm in maximal diameter which is stable.  The left popliteal aneurysm has grown to 2.49 cm in maximal diameter.  He remains asymptomatic. We discussed options today and he and his wife are in agreement to continue to observe this for now with his age.  We will recheck this with duplex in 6 months.

## 2022-05-03 NOTE — Progress Notes (Signed)
MRN : [id number]  Jerry [name] is a 89 y.o. ([birth year]) male who presents with chief complaint of  Chief Complaint  Patient presents with   Follow-up    Ultrasound follow up  .  History of Present Illness: Patient returns today in follow up of his popliteal artery aneurysms.  He is s/p endovascular AAA repair some years ago.  He has no symptoms.  He is doing well.  He has some claudication at long distances but no rest pain or ulceration.   Current Outpatient Medications  Medication Sig Dispense Refill   amLODipine (NORVASC) 2.5 MG tablet Take 1 tablet by mouth daily.     aspirin 81 MG EC tablet Take 81 mg by mouth daily.       atorvastatin (LIPITOR) 40 MG tablet Take 40 mg by mouth daily.       chlorproMAZINE (THORAZINE) 25 MG tablet Take 1 tablet (25 mg total) by mouth 3 (three) times daily as needed for hiccoughs. 21 tablet 0   Multiple Vitamins-Minerals (CENTRUM) tablet Take 1 tablet by mouth daily.       Multiple Vitamins-Minerals (ICAPS) TABS Take 1 tablet by mouth 2 (two) times daily.       pantoprazole (PROTONIX) 40 MG tablet Take 40 mg by mouth daily.     fluticasone-salmeterol (ADVAIR) 100-50 MCG/ACT AEPB Inhale into the lungs. (Patient not taking: Reported on 05/03/2022)     metoprolol tartrate (LOPRESSOR) 25 MG tablet Take 1 tablet (25 mg total) by mouth 2 (two) times daily. (Patient not taking: Reported on 10/01/2021) 60 tablet 6   No current facility-administered medications for this visit.    Past Medical History:  Diagnosis Date   AAA (abdominal aortic aneurysm) ([place])    Aneurysm ([place])    History of multiple vascular aneurysms. [name] involve the aorta, te iliac arteries, and the popliteal arteries.  The pt is s/p stent graft of an abdominal aortic aneurysm. All of his aneurysm follow up as been done at [facility]   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 This is manifested and is elevated, sed rate, pleuritis, Raynaud's  pehnomenon and she does have a positive rheumatoid factor.  He is being treated with Plquenil for his collagen vascular disease   Colonic polyp    History of nephrolithiasis    Hyperlipidemia    Hypertension    Macular degeneration    Osteoarthrosis, hip    Left hip   Paget's disease     Past Surgical History:  Procedure Laterality Date   ABDOMINAL AORTIC ANEURYSM REPAIR     DOPPLER ECHOCARDIOGRAPHY  2006   Ef greater than 60%. There are no regional wall motion abnormalities. There was mild mitral regurgitation, there is no aortic stenosis, aortic valve was mildly calcified   HERNIA REPAIR  1998   Lung Hamartoma       Social History   Tobacco Use   Smoking status: Former   Smokeless tobacco: Never   Tobacco comments:    Quit smoking more than 40 years ago  Substance Use Topics   Alcohol use: Yes    Comment: Rarely      Family History  Problem Relation Age of Onset   Heart attack Mother 55       MI   Alzheimer's disease Father   No bleeding or clotting disorders  Allergies  Allergen Reactions   Ivp Dye [Iodinated Contrast Media]    Simvastatin  REACTION: joint pains   Soap Itching    fragrant soaps     REVIEW OF SYSTEMS (Negative unless checked)  Constitutional: '[]'$ Weight loss  '[]'$ Fever  '[]'$ Chills Cardiac: '[]'$ Chest pain   '[]'$ Chest pressure   '[]'$ Palpitations   '[]'$ Shortness of breath when laying flat   '[]'$ Shortness of breath at rest   '[]'$ Shortness of breath with exertion. Vascular:  '[x]'$ Pain in legs with walking   '[]'$ Pain in legs at rest   '[]'$ Pain in legs when laying flat   '[]'$ Claudication   '[]'$ Pain in feet when walking  '[]'$ Pain in feet at rest  '[]'$ Pain in feet when laying flat   '[]'$ History of DVT   '[]'$ Phlebitis   '[]'$ Swelling in legs   '[]'$ Varicose veins   '[]'$ Non-healing ulcers Pulmonary:   '[]'$ Uses home oxygen   '[]'$ Productive cough   '[]'$ Hemoptysis   '[]'$ Wheeze  '[]'$ COPD   '[]'$ Asthma Neurologic:  '[]'$ Dizziness  '[]'$ Blackouts   '[]'$ Seizures   '[]'$ History of stroke   '[]'$ History of TIA  '[]'$ Aphasia    '[]'$ Temporary blindness   '[]'$ Dysphagia   '[]'$ Weakness or numbness in arms   '[]'$ Weakness or numbness in legs Musculoskeletal:  '[x]'$ Arthritis   '[]'$ Joint swelling   '[x]'$ Joint pain   '[]'$ Low back pain Hematologic:  '[]'$ Easy bruising  '[]'$ Easy bleeding   '[]'$ Hypercoagulable state   '[]'$ Anemic   Gastrointestinal:  '[]'$ Blood in stool   '[]'$ Vomiting blood  '[]'$ Gastroesophageal reflux/heartburn   '[]'$ Abdominal pain Genitourinary:  '[]'$ Chronic kidney disease   '[]'$ Difficult urination  '[]'$ Frequent urination  '[]'$ Burning with urination   '[]'$ Hematuria Skin:  '[]'$ Rashes   '[]'$ Ulcers   '[]'$ Wounds Psychological:  '[]'$ History of anxiety   '[]'$  History of major depression.  Physical Examination  BP (!) 157/78 (BP Location: Right Arm)   Pulse 70   Resp 16   Wt 165 lb 6.4 oz (75 kg)   BMI 26.70 kg/m  Gen:  WD/WN, NAD. Appears younger than stated age. Head: [place]/AT, No temporalis wasting. Ear/Nose/Throat: Hearing grossly intact, nares w/o erythema or drainage Eyes: Conjunctiva clear. Sclera non-icteric Neck: Supple.  Trachea midline Pulmonary:  Good air movement, no use of accessory muscles.  Cardiac: RRR, no JVD Vascular:  Vessel Right Left  Radial Palpable Palpable                          PT Palpable Palpable  DP Palpable Palpable   Gastrointestinal: soft, non-tender/non-distended. No guarding/reflex.  Musculoskeletal: M/S 5/5 throughout.  No deformity or atrophy. No edema. Neurologic: Sensation grossly intact in extremities.  Symmetrical.  Speech is fluent.  Psychiatric: Judgment intact, Mood & affect appropriate for pt's clinical situation. Dermatologic: No rashes or ulcers noted.  No cellulitis or open wounds.      Labs No results found for this or any previous visit (from the past 2160 hour(s)).  Radiology No results found.  Assessment/Plan Essential hypertension blood pressure control important in reducing the progression of atherosclerotic disease and AAA growth. On appropriate oral medications.   Hyperlipidemia lipid  control important in reducing the progression of atherosclerotic disease. Continue statin therapy  AAA (abdominal aortic aneurysm) without rupture (HCC) Check duplex on the next visit. S/p repair previously.  Popliteal aneurysm (HCC) Duplex today demonstrates right popliteal aneurysm measuring 1.28 cm in maximal diameter which is stable.  The left popliteal aneurysm has grown to 2.49 cm in maximal diameter.  He remains asymptomatic. We discussed options today and he and his wife are in agreement to continue to observe this for now with his age.  We will recheck  this with duplex in 6 months.      Leotis Pain, MD  05/03/2022 1:49 PM    This note was created with Dragon medical transcription system.  Any errors from dictation are purely unintentional

## 2022-05-03 NOTE — Assessment & Plan Note (Signed)
Check duplex on the next visit. S/p repair previously.

## 2022-11-11 ENCOUNTER — Ambulatory Visit (INDEPENDENT_AMBULATORY_CARE_PROVIDER_SITE_OTHER): Payer: Medicare Other

## 2022-11-11 ENCOUNTER — Ambulatory Visit (INDEPENDENT_AMBULATORY_CARE_PROVIDER_SITE_OTHER): Payer: Medicare Other | Admitting: Vascular Surgery

## 2022-11-11 VITALS — BP 159/71 | HR 66 | Resp 20 | Ht 65.0 in | Wt 160.0 lb

## 2022-11-11 DIAGNOSIS — I10 Essential (primary) hypertension: Secondary | ICD-10-CM

## 2022-11-11 DIAGNOSIS — I724 Aneurysm of artery of lower extremity: Secondary | ICD-10-CM

## 2022-11-11 DIAGNOSIS — I7143 Infrarenal abdominal aortic aneurysm, without rupture: Secondary | ICD-10-CM

## 2022-11-11 DIAGNOSIS — E785 Hyperlipidemia, unspecified: Secondary | ICD-10-CM

## 2022-11-11 NOTE — Assessment & Plan Note (Signed)
Duplex today shows a roughly 2.4 to 2.5 cm left popliteal aneurysm.  This has not changed dramatically from his previous study, but the patient is significantly concerned about this and at this point is much more interested in repair.  We discussed that his previous stent graft abdominal aortic aneurysm repair would complicate this somewhat and would likely preclude up and over intervention.  He is thin and an antegrade approach would be a reasonable option for addressing this large popliteal aneurysm.  We discussed the risks and benefits of the procedure.  We discussed the reason and rationale for treatment.  We discussed the popliteal aneurysms as they enlarge create pressure on the surrounding structures which often worsens leg swelling, and ultimately are at risk of thrombosis which has a very high risk of limb loss approaching 50% if that occurs.  He is very desirous to have this repaired once he returns from a planned trip they have in about 2 weeks.  We will plan to schedule his left popliteal aneurysm repair for mid October.

## 2022-11-11 NOTE — Progress Notes (Signed)
MRN : [id number]  Jerry [name] is a 89 y.o. (01/13/1929) male who presents with chief complaint of  Chief Complaint  Patient presents with   Venous Insufficiency  .  History of Present Illness: Patient returns today in follow up of multiple vascular issues.  He is 5 or 6 years status post endovascular abdominal aortic aneurysm repair with a stent graft.  This was done at another facility.  He has no current symptoms referable to his abdominal aortic aneurysm.  His duplex shows a patent stent graft without endoleak and aortic sac diameter of less than 4 cm. He is also followed for popliteal aneurysm.  He does have some chronic leg swelling bilaterally but this is not dramatically changed.  No ischemic symptoms of the lower extremity such as rest pain, ulceration, or lifestyle limiting claudication.  His duplex today shows a 2.4 to 2.5 cm left popliteal aneurysm.  Current Outpatient Medications  Medication Sig Dispense Refill   aspirin 81 MG EC tablet Take 81 mg by mouth daily.       atorvastatin (LIPITOR) 40 MG tablet Take 40 mg by mouth daily.       chlorproMAZINE (THORAZINE) 25 MG tablet Take 1 tablet (25 mg total) by mouth 3 (three) times daily as needed for hiccoughs. 21 tablet 0   diltiazem (CARDIZEM CD) 240 MG 24 hr capsule Take by mouth.     metoprolol tartrate (LOPRESSOR) 25 MG tablet Take 1 tablet (25 mg total) by mouth 2 (two) times daily. 60 tablet 6   pantoprazole (PROTONIX) 40 MG tablet Take 40 mg by mouth daily.     No current facility-administered medications for this visit.    Past Medical History:  Diagnosis Date   AAA (abdominal aortic aneurysm) ([facility])    Aneurysm (H[facility])    History of multiple vascular aneurysms. [name] involve the aorta, te iliac arteries, and the popliteal arteries.  The pt is s/p stent graft of an abdominal aortic aneurysm. All of his aneurysm follow up as been done at [facility].   Collagen vascular disease (HCC)    Sound like a mixed connective  tissue disease. This is manifested and is elevated, sed rate, pleuritis, Raynaud's pehnomenon and she does have a positive rheumatoid factor.  He is being treated with Plquenil for his collagen vascular disease   Colonic polyp    History of nephrolithiasis    Hyperlipidemia    Hypertension    Macular degeneration    Osteoarthrosis, hip    Left hip   Paget's disease     Past Surgical History:  Procedure Laterality Date   ABDOMINAL AORTIC ANEURYSM REPAIR     DOPPLER ECHOCARDIOGRAPHY  2006   Ef greater than 60%. There are no regional wall motion abnormalities. There was mild mitral regurgitation, there is no aortic stenosis, aortic valve was mildly calcified   HERNIA REPAIR  1998   Lung Hamartoma       Social History   Tobacco Use   Smoking status: Former   Smokeless tobacco: Never   Tobacco comments:    Quit smoking more than 40 years ago  Substance Use Topics   Alcohol use: Yes    Comment: Rarely       Family History  Problem Relation Age of Onset   Heart attack Mother 41       MI   Alzheimer's disease Father   No bleeding or clotting disorders  Allergies  Allergen Reactions   Ivp Dye [Iodinated Contrast Media]  Simvastatin     REACTION: joint pains   Soap Itching    fragrant soaps     REVIEW OF SYSTEMS (Negative unless checked)  Constitutional: [] Weight loss  [] Fever  [] Chills Cardiac: [] Chest pain   [] Chest pressure   [] Palpitations   [] Shortness of breath when laying flat   [] Shortness of breath at rest   [] Shortness of breath with exertion. Vascular:  [] Pain in legs with walking   [] Pain in legs at rest   [] Pain in legs when laying flat   [] Claudication   [] Pain in feet when walking  [] Pain in feet at rest  [] Pain in feet when laying flat   [] History of DVT   [] Phlebitis   [x] Swelling in legs   [] Varicose veins   [] Non-healing ulcers Pulmonary:   [] Uses home oxygen   [] Productive cough   [] Hemoptysis   [] Wheeze  [] COPD   [] Asthma Neurologic:  [] Dizziness   [] Blackouts   [] Seizures   [] History of stroke   [] History of TIA  [] Aphasia   [] Temporary blindness   [] Dysphagia   [] Weakness or numbness in arms   [] Weakness or numbness in legs Musculoskeletal:  [x] Arthritis   [] Joint swelling   [x] Joint pain   [] Low back pain Hematologic:  [] Easy bruising  [] Easy bleeding   [] Hypercoagulable state   [] Anemic   Gastrointestinal:  [] Blood in stool   [] Vomiting blood  [] Gastroesophageal reflux/heartburn   [] Abdominal pain Genitourinary:  [] Chronic kidney disease   [] Difficult urination  [] Frequent urination  [] Burning with urination   [] Hematuria Skin:  [] Rashes   [] Ulcers   [] Wounds Psychological:  [] History of anxiety   []  History of major depression.  Physical Examination  BP (!) 159/71 (BP Location: Left Arm)   Pulse 66   Resp 20   Ht 5\' 5"  (1.651 m)   Wt 160 lb (72.6 kg)   BMI 26.63 kg/m  Gen:  WD/WN, NAD. Appears younger than stated age. Head: [place]/AT, No temporalis wasting. Ear/Nose/Throat: Hearing somewhat diminished, nares w/o erythema or drainage Eyes: Conjunctiva clear. Sclera non-icteric Neck: Supple.  Trachea midline Pulmonary:  Good air movement, no use of accessory muscles.  Cardiac: RRR, no JVD Vascular:  Vessel Right Left  Radial Palpable Palpable                          PT 1+ 1+  DP 1+ 1+   Gastrointestinal: soft, non-tender/non-distended. No guarding/reflex.  Musculoskeletal: M/S 5/5 throughout.  No deformity or atrophy. 1+ BLE edema. Neurologic: Sensation grossly intact in extremities.  Symmetrical.  Speech is fluent.  Psychiatric: Judgment intact, Mood & affect appropriate for pt's clinical situation. Dermatologic: No rashes or ulcers noted.  No cellulitis or open wounds.      Labs No results found for this or any previous visit (from the past 2160 hour(s)).  Radiology No results found.  Assessment/Plan  AAA (abdominal aortic aneurysm) without rupture (HCC)  His duplex shows a patent stent graft without  endoleak and aortic sac diameter of less than 4 cm. Doing well.  No change in medications.  Follow annually with duplex.  Essential hypertension blood pressure control important in reducing the progression of atherosclerotic disease. On appropriate oral medications.   Hyperlipidemia lipid control important in reducing the progression of atherosclerotic disease. Continue statin therapy   Popliteal aneurysm (HCC) Duplex today shows a roughly 2.4 to 2.5 cm left popliteal aneurysm.  This has not changed dramatically from his previous study, but the patient is  significantly concerned about this and at this point is much more interested in repair.  We discussed that his previous stent graft abdominal aortic aneurysm repair would complicate this somewhat and would likely preclude up and over intervention.  He is thin and an antegrade approach would be a reasonable option for addressing this large popliteal aneurysm.  We discussed the risks and benefits of the procedure.  We discussed the reason and rationale for treatment.  We discussed the popliteal aneurysms as they enlarge create pressure on the surrounding structures which often worsens leg swelling, and ultimately are at risk of thrombosis which has a very high risk of limb loss approaching 50% if that occurs.  He is very desirous to have this repaired once he returns from a planned trip they have in about 2 weeks.  We will plan to schedule his left popliteal aneurysm repair for mid October.    Festus Barren, MD  11/11/2022 2:06 PM    This note was created with Dragon medical transcription system.  Any errors from dictation are purely unintentional

## 2022-11-11 NOTE — Assessment & Plan Note (Signed)
blood pressure control important in reducing the progression of atherosclerotic disease. On appropriate oral medications.  

## 2022-11-11 NOTE — Assessment & Plan Note (Signed)
His duplex shows a patent stent graft without endoleak and aortic sac diameter of less than 4 cm. Doing well.  No change in medications.  Follow annually with duplex.

## 2022-11-11 NOTE — Assessment & Plan Note (Signed)
lipid control important in reducing the progression of atherosclerotic disease. Continue statin therapy

## 2022-11-18 ENCOUNTER — Telehealth (INDEPENDENT_AMBULATORY_CARE_PROVIDER_SITE_OTHER): Payer: Self-pay

## 2022-11-18 NOTE — Telephone Encounter (Signed)
Spoke with the patient to schedule him for a left popliteal aneurysm repair - Antegrade with Dr. Wyn Quaker. Patient was offered mid October and declined wanting to be pushed out until 01/30/23. Patient scheduled with a 6:45 am arrival time to the [facility]. Pre-procedure instructions will be mailed as patient stated he is not active in his Mychart.

## 2023-01-23 ENCOUNTER — Telehealth (INDEPENDENT_AMBULATORY_CARE_PROVIDER_SITE_OTHER): Payer: Self-pay

## 2023-01-23 NOTE — Telephone Encounter (Signed)
Patient called wanting to know about taking something as he is allergic to contrast dye. It was explained that he would be given a meds to counter act the allergy of the contrast.

## 2023-01-30 ENCOUNTER — Ambulatory Visit
Admission: RE | Admit: 2023-01-30 | Discharge: 2023-01-30 | Disposition: A | Discharge status: Home / Self Care | Payer: Medicare Other | Attending: Vascular Surgery | Admitting: Vascular Surgery

## 2023-01-30 ENCOUNTER — Other Ambulatory Visit: Payer: Self-pay

## 2023-01-30 ENCOUNTER — Encounter: Payer: Self-pay | Admitting: Vascular Surgery

## 2023-01-30 ENCOUNTER — Encounter
Admission: RE | Disposition: A | Discharge status: Home / Self Care | Payer: Self-pay | Source: Home / Self Care | Attending: Vascular Surgery

## 2023-01-30 DIAGNOSIS — Z79899 Other long term (current) drug therapy: Secondary | ICD-10-CM | POA: Diagnosis not present

## 2023-01-30 DIAGNOSIS — I724 Aneurysm of artery of lower extremity: Secondary | ICD-10-CM | POA: Insufficient documentation

## 2023-01-30 DIAGNOSIS — I10 Essential (primary) hypertension: Secondary | ICD-10-CM | POA: Diagnosis not present

## 2023-01-30 DIAGNOSIS — I70202 Unspecified atherosclerosis of native arteries of extremities, left leg: Secondary | ICD-10-CM | POA: Insufficient documentation

## 2023-01-30 DIAGNOSIS — E785 Hyperlipidemia, unspecified: Secondary | ICD-10-CM | POA: Diagnosis not present

## 2023-01-30 DIAGNOSIS — I714 Abdominal aortic aneurysm, without rupture, unspecified: Secondary | ICD-10-CM | POA: Diagnosis not present

## 2023-01-30 HISTORY — PX: LOWER EXTREMITY ANGIOGRAPHY: CATH118251

## 2023-01-30 LAB — CREATININE, SERUM
Creatinine, Ser: 1.07 mg/dL (ref 0.61–1.24)
GFR, Estimated: >60 mL/min (ref >60)

## 2023-01-30 LAB — BUN: BUN: 20 mg/dL (ref 8–23)

## 2023-01-30 SURGERY — LOWER EXTREMITY ANGIOGRAPHY
Anesthesia: Moderate Sedation | Laterality: Left

## 2023-01-30 MED ORDER — HYDROMORPHONE HCL 1 MG/ML IJ SOLN: 1.0000 mg | Freq: Once | INTRAMUSCULAR | Status: DC | PRN

## 2023-01-30 MED ORDER — FENTANYL CITRATE (PF) 100 MCG/2ML IJ SOLN
INTRAMUSCULAR | Status: DC | PRN
  Administered 2023-01-30: 50 ug via INTRAVENOUS

## 2023-01-30 MED ORDER — CEFAZOLIN SODIUM-DEXTROSE 2-4 GM/100ML-% IV SOLN
2.0000 g | INTRAVENOUS | Status: AC
Start: 2023-01-30 — End: 2023-01-30
  Administered 2023-01-30: 2 g via INTRAVENOUS

## 2023-01-30 MED ORDER — NITROGLYCERIN 1 MG/10 ML FOR IR/CATH LAB
INTRA_ARTERIAL | Status: AC
  Filled 2023-01-30: qty 10

## 2023-01-30 MED ORDER — NITROGLYCERIN IN D5W 200-5 MCG/ML-% IV SOLN
INTRAVENOUS | Status: AC
  Filled 2023-01-30: qty 250

## 2023-01-30 MED ORDER — HEPARIN (PORCINE) IN NACL 2000-0.9 UNIT/L-% IV SOLN
INTRAVENOUS | Status: DC | PRN
  Administered 2023-01-30: 1000 mL

## 2023-01-30 MED ORDER — LIDOCAINE-EPINEPHRINE (PF) 1 %-1:200000 IJ SOLN
INTRAMUSCULAR | Status: DC | PRN
  Administered 2023-01-30: 10 mL

## 2023-01-30 MED ORDER — NITROGLYCERIN 1 MG/10 ML FOR IR/CATH LAB
INTRA_ARTERIAL | Status: DC | PRN
  Administered 2023-01-30: 400 ug

## 2023-01-30 MED ORDER — HYDRALAZINE HCL 20 MG/ML IJ SOLN: 5.0000 mg | INTRAMUSCULAR | Status: DC | PRN

## 2023-01-30 MED ORDER — ACETAMINOPHEN 325 MG PO TABS: 650.0000 mg | ORAL_TABLET | ORAL | Status: DC | PRN

## 2023-01-30 MED ORDER — SODIUM CHLORIDE 0.9% FLUSH: 3.0000 mL | INTRAVENOUS | Status: DC | PRN

## 2023-01-30 MED ORDER — SODIUM CHLORIDE 0.9 % IV SOLN: INTRAVENOUS | Status: DC

## 2023-01-30 MED ORDER — CLOPIDOGREL BISULFATE 75 MG PO TABS: 75.0000 mg | ORAL_TABLET | Freq: Every day | ORAL | Status: DC

## 2023-01-30 MED ORDER — ONDANSETRON HCL 4 MG/2ML IJ SOLN: 4.0000 mg | Freq: Four times a day (QID) | INTRAMUSCULAR | Status: DC | PRN

## 2023-01-30 MED ORDER — SODIUM CHLORIDE 0.9 % IV SOLN: 250.0000 mL | INTRAVENOUS | Status: DC | PRN

## 2023-01-30 MED ORDER — MIDAZOLAM HCL 2 MG/2ML IJ SOLN
INTRAMUSCULAR | Status: DC | PRN
  Administered 2023-01-30: 1 mg via INTRAVENOUS

## 2023-01-30 MED ORDER — IODIXANOL 320 MG/ML IV SOLN
INTRAVENOUS | Status: DC | PRN
  Administered 2023-01-30: 20 mL via INTRA_ARTERIAL

## 2023-01-30 MED ORDER — CLOPIDOGREL BISULFATE 75 MG PO TABS: 75.0000 mg | ORAL_TABLET | Freq: Every day | ORAL | 11 refills | Status: DC

## 2023-01-30 MED ORDER — DIPHENHYDRAMINE HCL 50 MG/ML IJ SOLN
INTRAMUSCULAR | Status: AC
  Filled 2023-01-30: qty 1

## 2023-01-30 MED ORDER — FAMOTIDINE 20 MG PO TABS
ORAL_TABLET | ORAL | Status: AC
  Filled 2023-01-30: qty 2

## 2023-01-30 MED ORDER — SODIUM CHLORIDE 0.9% FLUSH: 3.0000 mL | Freq: Two times a day (BID) | INTRAVENOUS | Status: DC

## 2023-01-30 MED ORDER — METHYLPREDNISOLONE SODIUM SUCC 125 MG IJ SOLR
125.0000 mg | Freq: Once | INTRAMUSCULAR | Status: AC | PRN
  Administered 2023-01-30: 125 mg via INTRAVENOUS

## 2023-01-30 MED ORDER — FENTANYL CITRATE (PF) 100 MCG/2ML IJ SOLN
INTRAMUSCULAR | Status: AC
  Filled 2023-01-30: qty 2

## 2023-01-30 MED ORDER — HEPARIN SODIUM (PORCINE) 1000 UNIT/ML IJ SOLN
INTRAMUSCULAR | Status: DC | PRN
  Administered 2023-01-30: 5000 [IU] via INTRAVENOUS

## 2023-01-30 MED ORDER — LABETALOL HCL 5 MG/ML IV SOLN: 10.0000 mg | INTRAVENOUS | Status: DC | PRN

## 2023-01-30 MED ORDER — MIDAZOLAM HCL 5 MG/5ML IJ SOLN
INTRAMUSCULAR | Status: AC
  Filled 2023-01-30: qty 5

## 2023-01-30 MED ORDER — HEPARIN SODIUM (PORCINE) 1000 UNIT/ML IJ SOLN
INTRAMUSCULAR | Status: AC
  Filled 2023-01-30: qty 10

## 2023-01-30 MED ORDER — DIPHENHYDRAMINE HCL 50 MG/ML IJ SOLN
50.0000 mg | Freq: Once | INTRAMUSCULAR | Status: AC | PRN
  Administered 2023-01-30: 50 mg via INTRAVENOUS

## 2023-01-30 MED ORDER — FAMOTIDINE 20 MG PO TABS
40.0000 mg | ORAL_TABLET | Freq: Once | ORAL | Status: AC | PRN
  Administered 2023-01-30: 40 mg via ORAL

## 2023-01-30 MED ORDER — METHYLPREDNISOLONE SODIUM SUCC 125 MG IJ SOLR
INTRAMUSCULAR | Status: AC
  Filled 2023-01-30: qty 2

## 2023-01-30 MED ORDER — MIDAZOLAM HCL 2 MG/ML PO SYRP: 8.0000 mg | ORAL_SOLUTION | Freq: Once | ORAL | Status: DC | PRN

## 2023-01-30 SURGICAL SUPPLY — 18 items
BALLN LUTONIX 6X150X130 (BALLOONS) ×1
BALLN LUTONIX 7X220X130 (BALLOONS) ×1
BALLOON LUTONIX 6X150X130 (BALLOONS) IMPLANT
BALLOON LUTONIX 7X220X130 (BALLOONS) IMPLANT
CATH ANGIO 5F PIGTAIL 65CM (CATHETERS) IMPLANT
CATH BEACON 5 .035 65 KMP TIP (CATHETERS) IMPLANT
COVER PROBE ULTRASOUND 5X96 (MISCELLANEOUS) IMPLANT
DEVICE PRESTO INFLATION (MISCELLANEOUS) IMPLANT
DEVICE STARCLOSE SE CLOSURE (Vascular Products) IMPLANT
DRAPE BRACHIAL (DRAPES) IMPLANT
GLIDESHEATH SLENDER 7FR .021G (SHEATH) IMPLANT
PACK ANGIOGRAPHY (CUSTOM PROCEDURE TRAY) ×1 IMPLANT
SHEATH BRITE TIP 5FRX11 (SHEATH) IMPLANT
STENT VIABAHN 7X150X120 (Permanent Stent) IMPLANT
STENT VIABAHN 8X15X120 7FR (Permanent Stent) IMPLANT
TUBING CONTRAST HIGH PRESS 72 (TUBING) IMPLANT
WIRE G V18X300CM (WIRE) IMPLANT
WIRE GUIDERIGHT .035X150 (WIRE) IMPLANT

## 2023-01-30 NOTE — H&P (Signed)
[facility] Admission History & Physical  MRN : [id number]  Jerry [name] is a 89 y.o. ([birth year]) male who presents with chief complaint of No chief complaint on file. 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  No major changes since his office visit a few months ago.  He and his wife have gone on an extended trip but he is back and ready to have this repaired.  Current Facility-Administered Medications  Medication Dose Route Frequency Provider Last Rate Last Admin   0.9 %  sodium chloride infusion   Intravenous Continuous Georgiana Spinner, NP 75 mL/hr at 01/30/23 0730 New Bag at 01/30/23 0730   ceFAZolin (ANCEF) IVPB 2g/100 mL premix  2 g Intravenous 30 min Pre-Op Georgiana Spinner, NP       HYDROmorphone (DILAUDID) injection 1 mg  1 mg Intravenous Once PRN Georgiana Spinner, NP       midazolam (VERSED) 2 MG/ML syrup 8 mg  8 mg Oral Once PRN Georgiana Spinner, NP       ondansetron [facility]) injection 4 mg  4 mg Intravenous Q6H PRN Georgiana Spinner, NP        Past Medical History:  Diagnosis Date   AAA (abdominal aortic aneurysm) (HCC)    Aneurysm (HCC)    History of multiple vascular aneurysms. [name] involve the aorta, te iliac arteries, and the popliteal arteries.  The pt is s/p stent graft of an abdominal aortic aneurysm. All of his aneurysm follow up as been done at [facility].   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 This is manifested and is elevated, sed rate, pleuritis, Raynaud's pehnomenon and she does have a positive rheumatoid factor.  He is being treated with Plquenil for his collagen vascular disease   Colonic polyp    History of nephrolithiasis    Hyperlipidemia    Hypertension    Macular degeneration    Osteoarthrosis, hip    Left hip   Paget's disease     Past Surgical History:  Procedure Laterality Date   ABDOMINAL AORTIC ANEURYSM REPAIR      DOPPLER ECHOCARDIOGRAPHY  2006   Ef greater than 60%. There are no regional wall motion abnormalities. There was mild mitral regurgitation, there is no aortic stenosis, aortic valve was mildly calcified   HERNIA REPAIR  1998   Lung Hamartoma       Social History   Tobacco Use   Smoking status: Former   Smokeless tobacco: Never   Tobacco comments:    Quit smoking more than 40 years ago  Substance Use Topics   Alcohol use: Yes    Comment: Rarely     Family History  Problem Relation Age of Onset   Heart attack Mother 68       MI   Alzheimer's disease Father     Allergies  Allergen Reactions   Ivp Dye [Iodinated Contrast Media]    Simvastatin     REACTION: joint pains   Soap Itching    fragrant soaps      REVIEW OF SYSTEMS (Negative unless checked)   Constitutional: [] Weight loss  [] Fever  [] Chills Cardiac: [] Chest pain   [] Chest pressure   [] Palpitations   [] Shortness of breath when laying flat   [] Shortness of breath at rest   [] Shortness of breath with exertion. Vascular:  [] Pain in legs with walking   []   Pain in legs at rest   [] Pain in legs when laying flat   [] Claudication   [] Pain in feet when walking  [] Pain in feet at rest  [] Pain in feet when laying flat   [] History of DVT   [] Phlebitis   [x] Swelling in legs   [] Varicose veins   [] Non-healing ulcers Pulmonary:   [] Uses home oxygen   [] Productive cough   [] Hemoptysis   [] Wheeze  [] COPD   [] Asthma Neurologic:  [] Dizziness  [] Blackouts   [] Seizures   [] History of stroke   [] History of TIA  [] Aphasia   [] Temporary blindness   [] Dysphagia   [] Weakness or numbness in arms   [] Weakness or numbness in legs Musculoskeletal:  [x] Arthritis   [] Joint swelling   [x] Joint pain   [] Low back pain Hematologic:  [] Easy bruising  [] Easy bleeding   [] Hypercoagulable state   [] Anemic   Gastrointestinal:  [] Blood in stool   [] Vomiting blood  [] Gastroesophageal reflux/heartburn   [] Abdominal pain Genitourinary:  [] Chronic kidney  disease   [] Difficult urination  [] Frequent urination  [] Burning with urination   [] Hematuria Skin:  [] Rashes   [] Ulcers   [] Wounds Psychological:  [] History of anxiety   []  History of major depression.    Physical Examination  Vitals:   01/30/23 0715  BP: (!) 148/72  Pulse: 68  Resp: (!) 22  Temp: (!) 97.5 F (36.4 C)  TempSrc: Oral  SpO2: 99%   There is no height or weight on file to calculate BMI. Gen: WD/WN, NAD.  Appears younger than stated age Head: [place]/AT, No temporalis wasting.  Ear/Nose/Throat: Hearing grossly intact, nares w/o erythema or drainage, oropharynx w/o Erythema/Exudate,  Eyes: Conjunctiva clear, sclera non-icteric Neck: Trachea midline.  No JVD.  Pulmonary:  Good air movement, respirations not labored, no use of accessory muscles.  Cardiac: RRR, normal S1, S2. Vascular:  Vessel Right Left  Radial Palpable Palpable                          PT Palpable Palpable  DP Palpable Palpable   Gastrointestinal: soft, non-tender/non-distended. No guarding/reflex.  Musculoskeletal: M/S 5/5 throughout.  Extremities without ischemic changes.  No deformity or atrophy.  Neurologic: Sensation grossly intact in extremities.  Symmetrical.  Speech is fluent. Motor exam as listed above. Psychiatric: Judgment intact, Mood & affect appropriate for pt's clinical situation. Dermatologic: No rashes or ulcers noted.  No cellulitis or open wounds.      CBC Lab Results  Component Value Date   WBC 7.7 05/14/2021   HGB 14.3 05/14/2021   HCT 44.8 05/14/2021   MCV 94.3 05/14/2021   PLT 181 05/14/2021    BMET    Component Value Date/Time   NA 141 05/14/2021 1404   K 4.4 05/14/2021 1404   CL 107 05/14/2021 1404   CO2 26 05/14/2021 1404   GLUCOSE 126 (H) 05/14/2021 1404   BUN 20 05/14/2021 1404   CREATININE 1.02 05/14/2021 1404   CALCIUM 9.3 05/14/2021 1404   GFRNONAA >60 05/14/2021 1404   GFRAA >60 10/17/2018 2352   CrCl cannot be calculated (Patient's most  recent lab result is older than the maximum 21 days allowed.).  COAG No results found for: "INR", "PROTIME"  Radiology No results found.   Assessment/Plan  AAA (abdominal aortic aneurysm) without rupture (HCC)  His recent duplex shows a patent stent graft without endoleak and aortic sac diameter of less than 4 cm. Doing well.  No change in medications.  Follow  annually with duplex.   Essential hypertension blood pressure control important in reducing the progression of atherosclerotic disease. On appropriate oral medications.     Hyperlipidemia lipid control important in reducing the progression of atherosclerotic disease. Continue statin therapy   Popliteal aneurysm (HCC)  Duplex recently shows a roughly 2.4 to 2.5 cm left popliteal aneurysm. This has not changed dramatically from his previous study, but the patient is significantly concerned about this and at this point is much more interested in repair. We discussed that his previous stent graft abdominal aortic aneurysm repair would complicate this somewhat and would likely preclude up and over intervention. He is thin and an antegrade approach would be a reasonable option for addressing this large popliteal aneurysm. We discussed the risks and benefits of the procedure. We discussed the reason and rationale for treatment. We discussed the popliteal aneurysms as they enlarge create pressure on the surrounding structures which often worsens leg swelling, and ultimately are at risk of thrombosis which has a very high risk of limb loss approaching 50% if that occurs. He is very desirous to have this repaired   Festus Barren, MD  01/30/2023 8:01 AM

## 2023-01-30 NOTE — Op Note (Signed)
[place] [facility]  Percutaneous Study/Intervention Procedural Note   Date of Surgery: 01/30/2023  Surgeon(s):Mckade Gurka    Assistants:none  Pre-operative Diagnosis: Left popliteal aneurysm  Post-operative diagnosis:  Same associated left SFA stenosis of greater than 75% above the popliteal aneurysm  Procedure(s) Performed:             1.  Ultrasound guidance for vascular access left femoral artery             2.  Selective left lower extremity angiogram             3.  Stent placement x 2 to the left SFA and popliteal arteries to treat both the occlusive disease and the aneurysm.             4.  StarClose closure device left femoral artery  EBL: 5 cc  Contrast: 20 cc  Fluoro Time: 2.6 minutes  Moderate Conscious Sedation Time: approximately 42 minutes using 1 mg of Versed and 50 mcg of Fentanyl              Indications:  Patient is a 89 y.o.male with a left popliteal artery aneurysm. The patient has noninvasive study showing this to measure approximately 2.5 cm in maximal diameter putting it at risk for thrombosis and limb loss. The patient is brought in for angiography for further evaluation and potential treatment.  Due to the limb threatening nature of the situation, angiogram was performed for attempted limb salvage. The patient is aware that if the procedure fails, amputation would be expected.  The patient also understands that even with successful revascularization, amputation may still be required due to the severity of the situation.  Risks and benefits are discussed and informed consent is obtained.   Procedure:  The patient was identified and appropriate procedural time out was performed.  The patient was then placed supine on the table and prepped and draped in the usual sterile fashion. Moderate conscious sedation was administered during a face to face encounter with the patient throughout the procedure with my supervision of the RN administering medicines  and monitoring the patient's vital signs, pulse oximetry, telemetry and mental status throughout from the start of the procedure until the patient was taken to the recovery room. Ultrasound was used to evaluate the left common femoral artery.  It was patent .  A digital ultrasound image was acquired.  A Seldinger needle was used to access the left common femoral artery/proximal SFA under direct ultrasound guidance and a permanent image was performed.  A 0.035 J wire was advanced without resistance and a 5Fr sheath was placed in an antegrade fashion. Selective left lower extremity angiogram was then performed. This demonstrated patent left common femoral artery, profunda femoris artery, and proximal SFA.  In the mid to distal SFA was about an 80% stenosis and then the popliteal artery was patent and enlarged in the midportion with the known popliteal artery aneurysm.  The below-knee popliteal artery normalized and there was three-vessel runoff distally without focal stenosis. It was felt that it was in the patient's best interest to proceed with intervention after these images to avoid a second procedure and a larger amount of contrast and fluoroscopy based off of the findings from the initial angiogram. The patient was systemically heparinized and a 7 French slender sheath was then placed over the V18 wire. I then used a Kumpe catheter and the V18 wire to cross the stenosis in the popliteal aneurysm without difficulty parking the wire  and the peroneal artery.  I then used a 7 mm diameter by 15 cm length Viabahn stent starting in the below-knee popliteal artery just above the origin of the anterior tibial artery and coming up to the proximal popliteal artery.  This encompassed much of the aneurysm but there was questionable seal proximally and we had not yet treated the occlusive disease in the distal SFA.  An 8 mm diameter by 15 cm length Viabahn stent was then deployed with about 3 cm of overlap going up into the  mid SFA.  This encompassed the proximal lesion as well and provided good seal for the aneurysm.  A 6 mm diameter Lutonix drug-coated balloon was used distally and a 7 mm diameter Lutonix drug-coated balloon was used proximally to post dilate the stents.  Completion imaging showed the stents to be widely patent without significant residual stenosis and successful exclusion of the popliteal aneurysm with maintained runoff distally. I elected to terminate the procedure. The sheath was removed and StarClose closure device was deployed in the left femoral artery with excellent hemostatic result. The patient was taken to the recovery room in stable condition having tolerated the procedure well.  Findings:                           Left Lower Extremity:  This demonstrated patent left common femoral artery, profunda femoris artery, and proximal SFA.  In the mid to distal SFA was about an 80% stenosis and then the popliteal artery was patent and enlarged in the midportion with the known popliteal artery aneurysm.  The below-knee popliteal artery normalized and there was three-vessel runoff distally without focal stenosis.   Disposition: Patient was taken to the recovery room in stable condition having tolerated the procedure well.  Complications: None  Festus Barren 01/30/2023 9:21 AM   This note was created with Dragon Medical transcription system. Any errors in dictation are purely unintentional.

## 2023-01-31 ENCOUNTER — Telehealth (INDEPENDENT_AMBULATORY_CARE_PROVIDER_SITE_OTHER): Payer: Self-pay

## 2023-01-31 NOTE — Telephone Encounter (Signed)
Patient called wanting to know that after his LLE angio with Dr. Wyn Quaker on 01/31/23 patient called wanting to know if he should take the Plavix and Aspirin together. Patient was advised to do so as that is the regimen for his procedure per Dr. Wyn Quaker. Patient also wanted to know if some discomfort was to be expected after the procedure and was advised that discomfort after his procedure is normal. I also spoke with Dr. Wyn Quaker and was told by him that the patient was given this information after his procedure and during a close that was made late last night as well.

## 2023-02-01 ENCOUNTER — Telehealth (INDEPENDENT_AMBULATORY_CARE_PROVIDER_SITE_OTHER): Payer: Self-pay

## 2023-02-01 NOTE — Telephone Encounter (Signed)
Patient called stating he had an angio because of a Left popliteal aneurysm on Monday with Dr.Dew. He removed his bandage yesterday and the area started to bleed with intense pain.   Patient is currently on the phone with Vernona Rieger and Vivia Birmingham has no taken over the call.

## 2023-02-01 NOTE — Telephone Encounter (Signed)
Discussed with the patient and he is no longer bleeding.  Advised to place pressure dressing and stay on bed rest for 4 hours. Afterwards he can keep dressing on and move and not to remove until the next day

## 2023-02-08 ENCOUNTER — Emergency Department: Payer: Medicare Other

## 2023-02-08 ENCOUNTER — Inpatient Hospital Stay
Admission: EM | Admit: 2023-02-08 | Discharge: 2023-02-10 | DRG: 920 | Disposition: A | Discharge status: Home Health | Payer: Medicare Other | Attending: Internal Medicine | Admitting: Internal Medicine

## 2023-02-08 ENCOUNTER — Other Ambulatory Visit: Payer: Self-pay

## 2023-02-08 DIAGNOSIS — I714 Abdominal aortic aneurysm, without rupture, unspecified: Secondary | ICD-10-CM | POA: Diagnosis present

## 2023-02-08 DIAGNOSIS — Z860100 Personal history of colon polyps, unspecified: Secondary | ICD-10-CM

## 2023-02-08 DIAGNOSIS — D72829 Elevated white blood cell count, unspecified: Secondary | ICD-10-CM | POA: Diagnosis present

## 2023-02-08 DIAGNOSIS — D62 Acute posthemorrhagic anemia: Secondary | ICD-10-CM | POA: Diagnosis present

## 2023-02-08 DIAGNOSIS — I724 Aneurysm of artery of lower extremity: Secondary | ICD-10-CM | POA: Diagnosis not present

## 2023-02-08 DIAGNOSIS — S7012XA Contusion of left thigh, initial encounter: Secondary | ICD-10-CM | POA: Diagnosis not present

## 2023-02-08 DIAGNOSIS — M25552 Pain in left hip: Principal | ICD-10-CM

## 2023-02-08 DIAGNOSIS — M359 Systemic involvement of connective tissue, unspecified: Secondary | ICD-10-CM

## 2023-02-08 DIAGNOSIS — Z87891 Personal history of nicotine dependence: Secondary | ICD-10-CM

## 2023-02-08 DIAGNOSIS — Z79899 Other long term (current) drug therapy: Secondary | ICD-10-CM

## 2023-02-08 DIAGNOSIS — I10 Essential (primary) hypertension: Secondary | ICD-10-CM | POA: Diagnosis present

## 2023-02-08 DIAGNOSIS — Z87442 Personal history of urinary calculi: Secondary | ICD-10-CM | POA: Diagnosis not present

## 2023-02-08 DIAGNOSIS — T45525A Adverse effect of antithrombotic drugs, initial encounter: Secondary | ICD-10-CM | POA: Diagnosis present

## 2023-02-08 DIAGNOSIS — E785 Hyperlipidemia, unspecified: Secondary | ICD-10-CM | POA: Diagnosis present

## 2023-02-08 DIAGNOSIS — Z7982 Long term (current) use of aspirin: Secondary | ICD-10-CM | POA: Diagnosis not present

## 2023-02-08 DIAGNOSIS — Z95820 Peripheral vascular angioplasty status with implants and grafts: Secondary | ICD-10-CM | POA: Diagnosis not present

## 2023-02-08 DIAGNOSIS — Z7902 Long term (current) use of antithrombotics/antiplatelets: Secondary | ICD-10-CM | POA: Diagnosis not present

## 2023-02-08 DIAGNOSIS — I7300 Raynaud's syndrome without gangrene: Secondary | ICD-10-CM | POA: Diagnosis present

## 2023-02-08 DIAGNOSIS — H3530 Unspecified macular degeneration: Secondary | ICD-10-CM | POA: Diagnosis present

## 2023-02-08 DIAGNOSIS — T148XXA Other injury of unspecified body region, initial encounter: Secondary | ICD-10-CM | POA: Diagnosis not present

## 2023-02-08 DIAGNOSIS — D6832 Hemorrhagic disorder due to extrinsic circulating anticoagulants: Secondary | ICD-10-CM | POA: Diagnosis present

## 2023-02-08 DIAGNOSIS — Z820 Family history of epilepsy and other diseases of the nervous system: Secondary | ICD-10-CM | POA: Diagnosis not present

## 2023-02-08 DIAGNOSIS — Y838 Other surgical procedures as the cause of abnormal reaction of the patient, or of later complication, without mention of misadventure at the time of the procedure: Secondary | ICD-10-CM | POA: Diagnosis present

## 2023-02-08 DIAGNOSIS — L7632 Postprocedural hematoma of skin and subcutaneous tissue following other procedure: Secondary | ICD-10-CM | POA: Diagnosis present

## 2023-02-08 DIAGNOSIS — Z8249 Family history of ischemic heart disease and other diseases of the circulatory system: Secondary | ICD-10-CM | POA: Diagnosis not present

## 2023-02-08 LAB — BASIC METABOLIC PANEL
Anion gap: 7 (ref 5–15)
BUN: 21 mg/dL (ref 8–23)
CO2: 23 mmol/L (ref 22–32)
Calcium: 8.3 mg/dL — ABNORMAL LOW (ref 8.9–10.3)
Chloride: 105 mmol/L (ref 98–111)
Creatinine, Ser: 1.03 mg/dL (ref 0.61–1.24)
GFR, Estimated: >60 mL/min (ref >60)
Glucose, Bld: 116 mg/dL — ABNORMAL HIGH (ref 70–99)
Potassium: 4.3 mmol/L (ref 3.5–5.1)
Sodium: 135 mmol/L (ref 135–145)

## 2023-02-08 LAB — RETICULOCYTES
Immature Retic Fract: 29.1 % — ABNORMAL HIGH (ref 2.3–15.9)
RBC.: 3 MIL/uL — ABNORMAL LOW (ref 4.22–5.81)
Retic Count, Absolute: 138 10*3/uL (ref 19.0–186.0)
Retic Ct Pct: 4.6 % — ABNORMAL HIGH (ref 0.4–3.1)

## 2023-02-08 LAB — CBC
HCT: 23.1 % — ABNORMAL LOW (ref 39.0–52.0)
Hemoglobin: 7.1 g/dL — ABNORMAL LOW (ref 13.0–17.0)
MCH: 31.1 pg (ref 26.0–34.0)
MCHC: 30.7 g/dL (ref 30.0–36.0)
MCV: 101.3 fL — ABNORMAL HIGH (ref 80.0–100.0)
Platelets: 340 10*3/uL (ref 150–400)
RBC: 2.28 MIL/uL — ABNORMAL LOW (ref 4.22–5.81)
RDW: 13.7 % (ref 11.5–15.5)
WBC: 12.3 10*3/uL — ABNORMAL HIGH (ref 4.0–10.5)
nRBC: 0 % (ref 0.0–0.2)

## 2023-02-08 LAB — CBC WITH DIFFERENTIAL/PLATELET
Abs Immature Granulocytes: 0.14 10*3/uL — ABNORMAL HIGH (ref 0.00–0.07)
Basophils Absolute: 0 10*3/uL (ref 0.0–0.1)
Basophils Relative: 0 %
Eosinophils Absolute: 0 10*3/uL (ref 0.0–0.5)
Eosinophils Relative: 0 %
HCT: 24.2 % — ABNORMAL LOW (ref 39.0–52.0)
Hemoglobin: 7.8 g/dL — ABNORMAL LOW (ref 13.0–17.0)
Immature Granulocytes: 1 %
Lymphocytes Relative: 4 %
Lymphs Abs: 0.7 10*3/uL (ref 0.7–4.0)
MCH: 31.2 pg (ref 26.0–34.0)
MCHC: 32.2 g/dL (ref 30.0–36.0)
MCV: 96.8 fL (ref 80.0–100.0)
Monocytes Absolute: 1.2 10*3/uL — ABNORMAL HIGH (ref 0.1–1.0)
Monocytes Relative: 8 %
Neutro Abs: 13.3 10*3/uL — ABNORMAL HIGH (ref 1.7–7.7)
Neutrophils Relative %: 87 %
Platelets: 381 10*3/uL (ref 150–400)
RBC: 2.5 MIL/uL — ABNORMAL LOW (ref 4.22–5.81)
RDW: 13.5 % (ref 11.5–15.5)
WBC: 15.4 10*3/uL — ABNORMAL HIGH (ref 4.0–10.5)
nRBC: 0 % (ref 0.0–0.2)

## 2023-02-08 LAB — IRON AND TIBC
Iron: 67 ug/dL (ref 45–182)
Saturation Ratios: 25 % (ref 17.9–39.5)
TIBC: 270 ug/dL (ref 250–450)
UIBC: 203 ug/dL

## 2023-02-08 LAB — FERRITIN: Ferritin: 64 ng/mL (ref 24–336)

## 2023-02-08 LAB — HEMOGLOBIN AND HEMATOCRIT, BLOOD
HCT: 29.7 % — ABNORMAL LOW (ref 39.0–52.0)
HCT: 28.7 % — ABNORMAL LOW (ref 39.0–52.0)
Hemoglobin: 9.6 g/dL — ABNORMAL LOW (ref 13.0–17.0)
Hemoglobin: 9.4 g/dL — ABNORMAL LOW (ref 13.0–17.0)

## 2023-02-08 LAB — FOLATE: Folate: 25 ng/mL (ref >5.9)

## 2023-02-08 LAB — PREPARE RBC (CROSSMATCH)

## 2023-02-08 LAB — VITAMIN B12: Vitamin B-12: 758 pg/mL (ref 180–914)

## 2023-02-08 LAB — ABO/RH: ABO/RH(D): A POS

## 2023-02-08 MED ORDER — OXYCODONE HCL 5 MG PO TABS
5.0000 mg | ORAL_TABLET | Freq: Once | ORAL | Status: AC
  Administered 2023-02-08: 5 mg via ORAL
  Filled 2023-02-08: qty 1

## 2023-02-08 MED ORDER — SODIUM CHLORIDE 0.9 % IV SOLN
10.0000 mL/h | Freq: Once | INTRAVENOUS | Status: AC
Start: 2023-02-08 — End: 2023-02-08
  Administered 2023-02-08: 10 mL/h via INTRAVENOUS

## 2023-02-08 MED ORDER — ACETAMINOPHEN 325 MG PO TABS
650.0000 mg | ORAL_TABLET | Freq: Four times a day (QID) | ORAL | Status: DC | PRN
  Administered 2023-02-08 – 2023-02-09 (×3): 650 mg via ORAL
  Filled 2023-02-08 (×3): qty 2

## 2023-02-08 MED ORDER — KETOROLAC TROMETHAMINE 15 MG/ML IJ SOLN
15.0000 mg | Freq: Once | INTRAMUSCULAR | Status: AC
  Administered 2023-02-08: 15 mg via INTRAMUSCULAR
  Filled 2023-02-08: qty 1

## 2023-02-08 MED ORDER — ONDANSETRON HCL 4 MG PO TABS: 4.0000 mg | ORAL_TABLET | Freq: Four times a day (QID) | ORAL | Status: DC | PRN

## 2023-02-08 MED ORDER — ONDANSETRON HCL 4 MG/2ML IJ SOLN: 4.0000 mg | Freq: Four times a day (QID) | INTRAMUSCULAR | Status: DC | PRN

## 2023-02-08 MED ORDER — SODIUM CHLORIDE 0.9 % IV SOLN: INTRAVENOUS | Status: DC

## 2023-02-08 MED ORDER — ASPIRIN 81 MG PO TBEC
81.0000 mg | DELAYED_RELEASE_TABLET | Freq: Every day | ORAL | Status: DC
  Administered 2023-02-09 – 2023-02-10 (×2): 81 mg via ORAL
  Filled 2023-02-08 (×2): qty 1

## 2023-02-08 MED ORDER — ACETAMINOPHEN 325 MG PO TABS
650.0000 mg | ORAL_TABLET | Freq: Once | ORAL | Status: AC
  Administered 2023-02-08: 650 mg via ORAL
  Filled 2023-02-08: qty 2

## 2023-02-08 NOTE — ED Notes (Signed)
Assumed patient care at approximately 1100 and received report from the previous nurse.

## 2023-02-08 NOTE — ED Notes (Signed)
Nutrition services called to request dinner tray.

## 2023-02-08 NOTE — Assessment & Plan Note (Signed)
Cont statin

## 2023-02-08 NOTE — ED Notes (Signed)
Lab called to request phlebotomist to collect labs.

## 2023-02-08 NOTE — ED Provider Notes (Signed)
Care of this patient assumed from prior physician at 0700 pending vascular evaluation and disposition. Please see prior physician note for further details.  Briefly this is a 89 year old male with history of Paget's disease, AAA, HTN s/p left lower extremity angiogram with stent placement on 12/9 with Dr. Wyn Quaker who presented with acute onset left hip pain, found to have large hyperdense mass concerning for hematoma on imaging.  The case was reviewed with vascular surgery who plans to evaluate the patient at bedside to provide further recommendations.  Labs were also ordered, pending at the time I assumed care.  These did result with leukocytosis with WBC of 15.4, anemia with a hemoglobin of 7.8, most recent prior from a year ago at which time patient was 16 concerning for acute blood loss.  Case was reviewed with NP pace with vascular surgery.  He did feel that the presentation was consistent with a hematoma.  He did recommend discontinuation of the patient's Plavix and follow-up with them on 1/8.  Did recommend pain meds as needed, avoiding NSAIDs, warm compresses.  He did recommend regular movement as tolerated and did feel that patient may require a walker to get around.  Did not recommend surgical intervention for the patient's hematoma.  Recommended further workup for blood loss as indicated.  Patient was reevaluated, denies any other identifiable bleeding sources.  Hemoccult negative.  Given degree of blood loss, 4-hour repeat was obtained which unfortunately did downtrend to 7.1.  With known acute bleeding, we will go ahead and transfuse 1 unit of PRBCs particularly given reported fatigue.  With symptomatic anemia with downtrending hemoglobin, do think patient is appropriate for admission.  Will reach out to hospitalist team.   Case discussed with Dr. Alvester Morin who will evaluate the patient for anticipated admission.  Discussed CODE STATUS. Patient is currently a full code, but will discuss further with  his family.   Trinna Post, MD 02/08/23 [phone]

## 2023-02-08 NOTE — Consult Note (Signed)
Hospital Consult    Reason for Consult:  Left Hip Pain  Requesting Physician:  Dr Pilar Jarvis MD  MRN #:  [id number]  History of Present Illness: This is a 89 y.o. male with past medical history of Paget's disease, abdominal aortic aneurysm, hypertension hyperlipidemia, and a recent left lower extremity angiogram with stent placement to the left SFA and popliteal arteries to treat occlusive disease and aneurysm on 01/30/2023 by Dr. Wyn Quaker of vascular surgery. He presents to the emergency department with left hip pain.   Left groin access site for the surgery has some residual bruising, mild pain since the surgery unchanged.  Noted erythema to anterior to lateral left thigh. Edema is +3 from groin area to knee with +2 pedal edema. He was getting up from a chair when he felt a pop in his left groin/hip and is concerned that he broke a bone.The pain worsened over the next 24 hours. Vascular Surgery consulted to evaluate.   Past Medical History:  Diagnosis Date   AAA (abdominal aortic aneurysm) (HCC)    Aneurysm (HCC)    History of multiple vascular aneurysms. [name] involve the aorta, te iliac arteries, and the popliteal arteries.  The pt is s/p stent graft of an abdominal aortic aneurysm. All of his aneurysm follow up as been done at [facility].   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 This is manifested and is elevated, sed rate, pleuritis, Raynaud's pehnomenon and she does have a positive rheumatoid factor.  He is being treated with Plquenil for his collagen vascular disease   Colonic polyp    History of nephrolithiasis    Hyperlipidemia    Hypertension    Macular degeneration    Osteoarthrosis, hip    Left hip   Paget's disease     Past Surgical History:  Procedure Laterality Date   ABDOMINAL AORTIC ANEURYSM REPAIR     DOPPLER ECHOCARDIOGRAPHY  2006   Ef greater than 60%. There are no regional wall motion abnormalities. There was mild mitral  regurgitation, there is no aortic stenosis, aortic valve was mildly calcified   HERNIA REPAIR  1998   LOWER EXTREMITY ANGIOGRAPHY Left 01/30/2023   Procedure: Lower Extremity Angiography;  Surgeon: Annice Needy, MD;  Location: [facility] INVASIVE CV LAB;  Service: Cardiovascular;  Laterality: Left;   Lung Hamartoma      Allergies  Allergen Reactions   Ivp Dye [Iodinated Contrast Media]    Simvastatin     REACTION: joint pains   Soap Itching    fragrant soaps    Prior to Admission medications   Medication Sig Start Date End Date Taking? Authorizing Provider  aspirin 81 MG EC tablet Take 81 mg by mouth daily.      [provider]  atorvastatin (LIPITOR) 40 MG tablet Take 40 mg by mouth daily.      [provider]  chlorproMAZINE (THORAZINE) 25 MG tablet Take 1 tablet (25 mg total) by mouth 3 (three) times daily as needed for hiccoughs. Patient not taking: Reported on 01/30/2023 05/04/17   Darci Current, MD  clopidogrel (PLAVIX) 75 MG tablet Take 1 tablet (75 mg total) by mouth daily. 01/30/23   Annice Needy, MD  diltiazem (CARDIZEM CD) 240 MG 24 hr capsule Take by mouth. 09/30/22 09/30/23  [provider]  metoprolol tartrate (LOPRESSOR) 25 MG tablet Take 1 tablet (25 mg total) by mouth 2 (two) times daily. Patient not taking: Reported on 01/30/2023  09/29/10   Antonieta Iba, MD  pantoprazole (PROTONIX) 40 MG tablet Take 40 mg by mouth daily. 08/27/20   [provider]    Social History   Socioeconomic History   Marital status: Married    Spouse name: Not on file   Number of children: Not on file   Years of education: Not on file   Highest education level: Not on file  Occupational History   Occupation: Retired  Tobacco Use   Smoking status: Former   Smokeless tobacco: Never   Tobacco comments:    Quit smoking more than 40 years ago  Substance and Sexual Activity   Alcohol use: Yes    Comment: Rarely   Drug use: Not on file   Sexual activity: Not  on file  Other Topics Concern   Not on file  Social History Narrative   Married and lives in [place]   3 children and grandchildren   Social Drivers of Corporate investment banker Strain: Not on file  Food Insecurity: Not on file  Transportation Needs: Not on file  Physical Activity: Not on file  Stress: Not on file  Social Connections: Not on file  Intimate Partner Violence: Not on file     Family History  Problem Relation Age of Onset   Heart attack Mother 61       MI   Alzheimer's disease Father     ROS: Otherwise negative unless mentioned in HPI  Physical Examination  Vitals:   02/08/23 0309 02/08/23 0614  BP: 135/66 (!) 124/55  Pulse: 82 81  Resp: 18 14  Temp: (!) 97.5 F (36.4 C) 97.8 F (36.6 C)  SpO2: 100% 96%   Body mass index is 26.15 kg/m.  General:  WDWN in NAD Gait: Not observed HENT: WNL, normocephalic Pulmonary: normal non-labored breathing, without Rales, rhonchi,  wheezing Cardiac: regular, without  Murmurs, rubs or gallops; without carotid bruits Abdomen: Positive bowel sounds, soft, NT/ND, no masses Skin: without rashes Vascular Exam/Pulses: Doppler bilateral lower extremity pulses due to +2 pedal edema. Noted +3 edema to right thigh with noted hematoma measuring 8.0 X 5.3 X 12.1 cm to anterior compartment muscles of the left lower extremity.  Extremities: without ischemic changes, without Gangrene , without cellulitis; without open wounds;  Musculoskeletal: no muscle wasting or atrophy  Neurologic: A&O X 3;  No focal weakness or paresthesias are detected; speech is fluent/normal Psychiatric:  The pt has Normal affect. Lymph:  Unremarkable  CBC    Component Value Date/Time   WBC 7.7 05/14/2021 1404   RBC 4.75 05/14/2021 1404   HGB 14.3 05/14/2021 1404   HCT 44.8 05/14/2021 1404   PLT 181 05/14/2021 1404   MCV 94.3 05/14/2021 1404   MCH 30.1 05/14/2021 1404   MCHC 31.9 05/14/2021 1404   RDW 12.8 05/14/2021 1404   LYMPHSABS 1.3  10/17/2018 2352   MONOABS 1.1 (H) 10/17/2018 2352   EOSABS 0.2 10/17/2018 2352   BASOSABS 0.0 10/17/2018 2352    BMET    Component Value Date/Time   NA 141 05/14/2021 1404   K 4.4 05/14/2021 1404   CL 107 05/14/2021 1404   CO2 26 05/14/2021 1404   GLUCOSE 126 (H) 05/14/2021 1404   BUN 20 01/30/2023 0729   CREATININE 1.07 01/30/2023 0729   CALCIUM 9.3 05/14/2021 1404   GFRNONAA >60 01/30/2023 0729   GFRAA >60 10/17/2018 2352    COAGS: No results found for: "INR", "PROTIME"   Non-Invasive Vascular Imaging:  EXAM:02/08/23 CT OF THE LEFT HIP WITHOUT CONTRAST   TECHNIQUE: Multidetector CT imaging of the left hip was performed according to the standard protocol. Multiplanar CT image reconstructions were also generated.   RADIATION DOSE REDUCTION: This exam was performed according to the departmental dose-optimization program which includes automated exposure control, adjustment of the mA and/or kV according to patient size and/or use of iterative reconstruction technique.   COMPARISON:  None Available.   FINDINGS: Bones/Joint/Cartilage   There is no sign of acute fracture or dislocation. Cortical thickening and accentuation of trabecular markings are identified involving the imaged portions of the proximal left femur. Diffuse joint space narrowing is identified with mild marginal spur formation.   Ligaments   Suboptimally assessed by CT.   Muscles and Tendons   Partially visualized, heterogeneous, hyperdense mass is identified involving the anterior compartment muscles of the proximal left lower extremity. This measures 8.0 x 5.3 by 12.1 cm. Soft tissue stranding is identified within the overlying anterior and lateral superficial subcutaneous fat.   Soft tissues   Iliac limbs of aortic stent graft identified. Extensive arterial vascular calcifications identified. Small clips identified within the soft tissues surrounding the left femoral artery with  adjacent fat stranding.   IMPRESSION: 1. Partially visualized, heterogeneous, hyperdense mass is identified involving the anterior compartment muscles of the proximal left lower extremity. This measures 8.0 x 5.3 x 12.1 cm. Imaging features are compatible with a hematoma. 2. No signs of acute fracture or dislocation. 3. 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 4. Extensive arterial vascular calcifications. 5. Small clips identified within the soft tissues surrounding the left femoral artery with adjacent fat stranding. Most likely related to recent vascular access.  Statin:  Yes.   Beta Blocker:  Yes.   Aspirin:  Yes.   ACEI:  No. ARB:  No. CCB use:  Yes Other antiplatelets/anticoagulants:  Yes.   Plavix 75 mg daily.    ASSESSMENT/PLAN: This is a 89 y.o. male Who presents to [facility] emergency department with left hip and left lower extremity swelling after feeling something pop after standing from a sitting position. Patient underwent Angiogram with stent placement to the right SFA and Popliteal arteries on 01/30/2023. Concern is hematoma to left lower extremity may be from the puncture site from angiogram on 01/30/23.   On work up patient underwent CT of the left hip showing a hyperdense mass involving the anterior compartment muscles of the proximal left lower extremity. Imaging was compatible with hematoma.  After reviewing the CT with Dr. Festus Barren MD it appears that the hematoma is not really in contact with any of the major vascular events stented with the prior angiogram.  It does not appear that there is a pseudoaneurysm and this looks more like a spontaneous intramuscular hematoma.  This is most likely caused by some type of injury to his leg.  Patient is known to be on ASA 81 mg daily and Plavix 75 mg daily in order to keep his stents from thrombosing.  We recommend he stay on both of these medications if possible but if unable to patient needs to  remain on one of them at this time.  We recommended that he stay on a daily aspirin 81 mg daily.  Currently there is no intervention needed for possible to help reduce any of the intramuscular hematoma.  We recommend warm compresses to his leg 4-5 times a day.  We also recommend that he continue to ambulate 4-5 times  a day.  We also recommend that he use a walker for any kind of ambulation to help assist and prevent falls.  We also recommend Tylenol for pain with some narcotic medication only at night due to his age and possible falls.  We recommend that he stay away from any nonsteroidal anti-inflammatories such as Advil Motrin Aleve.  Patient is scheduled to follow-up in vein and vascular surgery clinic on January 8 and I instructed both the patient and his son and his wife at the bedside to keep that appointment.  We can do outpatient ultrasounds of his lower extremities at that time.  I did discuss the concerning issue the patient's hemoglobin being low with the ER physician this morning.  I was told the patient's stool would be guaiac and a rectal exam performed to see if he was GI bleeding due to him taking aspirin and Plavix postprocedure.  If the ER physician felt patient need to be transfused and admitted vascular surgery agrees due to low hemoglobin.  Recommend repeat CBC in 6 to 8 hours to follow his hemoglobin.  His white blood cell count was noted to be elevated as well we recommend repeating that as well in 6 hours as this may be related to the hematoma or trauma to his leg.   -I discussed the plan in detail with Dr. Festus Barren MD and he agrees with the plan.   Marcie Bal [facility] 02/08/2023 7:10 AM

## 2023-02-08 NOTE — Assessment & Plan Note (Signed)
BP stable.  Monitor °

## 2023-02-08 NOTE — Assessment & Plan Note (Signed)
Noted large L thigh hematoma s/p Stent placement x 2 to the left SFA and popliteal arteries 01/30/23  Vascular surgery consulted- no intervention at present  Hold offending agents including ASA and plavix  Trend hgb  Transfuse for hgb <7  Follow up vascular surgery recommendations

## 2023-02-08 NOTE — H&P (Addendum)
History and Physical    Patient: Jerry [name]:[id number] DOB: 03-Sep-1928 DOA: 02/08/2023 DOS: the patient was seen and examined on 02/08/2023 PCP: Lauro Regulus, MD  Patient coming from: Home  Chief Complaint:  Chief Complaint  Patient presents with   Post-op Problem   HPI: Jerry [name] is a 89 y.o. male with medical history significant of AAA, collagen vascular disease, hypertension, hyperlipidemia, popliteal aneurysm status post stenting presenting with left thigh hematoma.  Patient with noted evaluation for left popliteal aneurysm with vascular surgery on December 9.  Had stent placement x 2 to the left SFA and popliteal arteries to treat both occlusive disease and aneurysm.  Has had otherwise normal postoperative course.  Patient reports he was in the bathroom earlier today when going back to the recliner he felt a pop and significant pain near the incision site.  Has had worsening pain swelling and bruising.  Noted to have been started on aspirin and Plavix at discharge from procedure.  No chest pain or shortness of breath.  No nausea or vomiting.  No abdominal pain.  No focal hemiparesis or confusion.  Has noticed worsening bruising of the left groin and thigh. Presented to the ER afebrile, hemodynamically stable.  White count 15.4, hemoglobin 7.8 with baseline hemoglobin around 14.  CT of left hip with 8 x 5.3 x 4 cm area concerning for hematoma.  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 Review of Systems: As mentioned in the history of present illness. All other systems reviewed and are negative. Past Medical History:  Diagnosis Date   AAA (abdominal aortic aneurysm) (HCC)    Aneurysm (HCC)    History of multiple vascular aneurysms. [name] involve the aorta, te iliac arteries, and the popliteal arteries.  The pt is s/p stent graft of an abdominal aortic aneurysm. All of his aneurysm follow up as been done at [facility].   Collagen vascular disease (HCC)    Sound like a  mixed connective tissue disease. This is manifested and is elevated, sed rate, pleuritis, Raynaud's pehnomenon and she does have a positive rheumatoid factor.  He is being treated with Plquenil for his collagen vascular disease   Colonic polyp    History of nephrolithiasis    Hyperlipidemia    Hypertension    Macular degeneration    Osteoarthrosis, hip    Left hip   Paget's disease    Past Surgical History:  Procedure Laterality Date   ABDOMINAL AORTIC ANEURYSM REPAIR     DOPPLER ECHOCARDIOGRAPHY  2006   Ef greater than 60%. There are no regional wall motion abnormalities. There was mild mitral regurgitation, there is no aortic stenosis, aortic valve was mildly calcified   HERNIA REPAIR  1998   LOWER EXTREMITY ANGIOGRAPHY Left 01/30/2023   Procedure: Lower Extremity Angiography;  Surgeon: Annice Needy, MD;  Location: [facility] INVASIVE CV LAB;  Service: Cardiovascular;  Laterality: Left;   Lung Hamartoma     Social History:  reports that he has quit smoking. He has never used smokeless tobacco. He reports current alcohol use. No history on file for drug use.  Allergies  Allergen Reactions   Ivp Dye [Iodinated Contrast Media]    Simvastatin     REACTION: joint pains   Soap Itching    fragrant soaps    Family History  Problem Relation Age of Onset   Heart attack Mother 19       MI   Alzheimer's disease Father     Prior  to Admission medications   Medication Sig Start Date End Date Taking? Authorizing Provider  aspirin 81 MG EC tablet Take 81 mg by mouth daily.      [provider]  atorvastatin (LIPITOR) 40 MG tablet Take 40 mg by mouth daily.      [provider]  chlorproMAZINE (THORAZINE) 25 MG tablet Take 1 tablet (25 mg total) by mouth 3 (three) times daily as needed for hiccoughs. Patient not taking: Reported on 01/30/2023 05/04/17   Darci Current, MD  clopidogrel (PLAVIX) 75 MG tablet Take 1 tablet (75 mg total) by mouth daily. 01/30/23   Annice Needy, MD   diltiazem (CARDIZEM CD) 240 MG 24 hr capsule Take by mouth. 09/30/22 09/30/23  [provider]  metoprolol tartrate (LOPRESSOR) 25 MG tablet Take 1 tablet (25 mg total) by mouth 2 (two) times daily. Patient not taking: Reported on 01/30/2023 09/29/10   Antonieta Iba, MD  pantoprazole (PROTONIX) 40 MG tablet Take 40 mg by mouth daily. 08/27/20   [provider]    Physical Exam: Vitals:   02/08/23 0307 02/08/23 0309 02/08/23 0614 02/08/23 1137  BP:  135/66 (!) 124/55 124/60  Pulse:  82 81 82  Resp:  18 14 16   Temp:  (!) 97.5 F (36.4 C) 97.8 F (36.6 C) 97.7 F (36.5 C)  TempSrc:  Oral Oral Oral  SpO2:  100% 96% 96%  Weight: 73.5 kg     Height: 5\' 6"  (1.676 m)      Physical Exam Constitutional:      Appearance: He is normal weight.  HENT:     Head: Normocephalic and atraumatic.     Nose: Nose normal.     Mouth/Throat:     Mouth: Mucous membranes are moist.  Eyes:     Pupils: Pupils are equal, round, and reactive to light.  Cardiovascular:     Rate and Rhythm: Normal rate and regular rhythm.  Pulmonary:     Effort: Pulmonary effort is normal.  Abdominal:     General: Bowel sounds are normal.  Musculoskeletal:        General: Normal range of motion.  Skin:    Comments: + bruising in L thigh and groin    Neurological:     General: No focal deficit present.  Psychiatric:        Mood and Affect: Mood normal.     Data Reviewed:  There are no new results to review at this time.  US PELVIS LIMITED (TRANSABDOMINAL ONLY) CLINICAL DATA:  Swelling after arterial access in the left groin  EXAM: LIMITED ULTRASOUND OF PELVIS  TECHNIQUE: Limited transabdominal ultrasound examination of the pelvis was performed.  COMPARISON:  Noncontrast pelvis/hip CT 02/08/2023  FINDINGS: Avascular hypoechoic material medially anterior to an arterial structure (superficial femoral artery based on prior CT) with dimensions measuring 6 cm in length by approximately 2 cm  in thickness. By CT this hematoma is contiguous with a large hematoma expanding the upper and anterior muscular compartment of the left thigh. No pseudoaneurysm seen by color Doppler.  IMPRESSION: Large hematoma in the upper anterior compartment of the left thigh contiguous with hematoma anterior to the upper SFA. No detected pseudoaneurysm by color Doppler.  Electronically Signed   By: Tiburcio Pea M.D.   On: 02/08/2023 06:27 CT Hip Left Wo Contrast CLINICAL DATA:  Status post stent placement in the left groin region. Patient now has pain in the incision site.  EXAM: CT OF THE LEFT  HIP WITHOUT CONTRAST  TECHNIQUE: Multidetector CT imaging of the left hip was performed according to the standard protocol. Multiplanar CT image reconstructions were also generated.  RADIATION DOSE REDUCTION: This exam was performed according to the departmental dose-optimization program which includes automated exposure control, adjustment of the mA and/or kV according to patient size and/or use of iterative reconstruction technique.  COMPARISON:  None Available.  FINDINGS: Bones/Joint/Cartilage  There is no sign of acute fracture or dislocation. Cortical thickening and accentuation of trabecular markings are identified involving the imaged portions of the proximal left femur. Diffuse joint space narrowing is identified with mild marginal spur formation.  Ligaments  Suboptimally assessed by CT.  Muscles and Tendons  Partially visualized, heterogeneous, hyperdense mass is identified involving the anterior compartment muscles of the proximal left lower extremity. This measures 8.0 x 5.3 by 12.1 cm. Soft tissue stranding is identified within the overlying anterior and lateral superficial subcutaneous fat.  Soft tissues  Iliac limbs of aortic stent graft identified. Extensive arterial vascular calcifications identified. Small clips identified within the soft tissues surrounding the  left femoral artery with adjacent fat stranding.  IMPRESSION: 1. Partially visualized, heterogeneous, hyperdense mass is identified involving the anterior compartment muscles of the proximal left lower extremity. This measures 8.0 x 5.3 x 12.1 cm. Imaging features are compatible with a hematoma. 2. No signs of acute fracture or dislocation. 3. 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 4. Extensive arterial vascular calcifications. 5. Small clips identified within the soft tissues surrounding the left femoral artery with adjacent fat stranding. Most likely related to recent vascular access.  Electronically Signed   By: Signa Kell M.D.   On: 02/08/2023 06:08 DG Hip Unilat W or Wo Pelvis 2-3 Views Left CLINICAL DATA:  Incision site pain from vascular access  EXAM: DG HIP (WITH OR WITHOUT PELVIS) 3V LEFT  COMPARISON:  08/31/2018 report, images not available  FINDINGS: Limited pelvis due to rotation. No evidence of acute fracture or subluxation. There is trabecular and cortical thickening and coarsening involving the proximal left femur, compatible with Paget's, also mentioned on prior hip series. Similar appearance is seen at the right femoral head which may also be less affected by Paget's. Atherosclerosis with aorta iliac and left SFA stenting.  IMPRESSION: 1. No acute finding. 2. Paget's of the proximal left femur.  Electronically Signed   By: Tiburcio Pea M.D.   On: 02/08/2023 04:27  Lab Results  Component Value Date   WBC 12.3 (H) 02/08/2023   HGB 7.1 (L) 02/08/2023   HCT 23.1 (L) 02/08/2023   MCV 101.3 (H) 02/08/2023   PLT 340 02/08/2023   Last metabolic panel Lab Results  Component Value Date   GLUCOSE 116 (H) 02/08/2023   NA 135 02/08/2023   K 4.3 02/08/2023   CL 105 02/08/2023   CO2 23 02/08/2023   BUN 21 02/08/2023   CREATININE 1.03 02/08/2023   GFRNONAA >60 02/08/2023   CALCIUM 8.3 (L) 02/08/2023   PROT 7.0  10/17/2018   ALBUMIN 4.2 10/17/2018   BILITOT 0.5 10/17/2018   ALKPHOS 105 10/17/2018   AST 22 10/17/2018   ALT 19 10/17/2018   ANIONGAP 7 02/08/2023    Assessment and Plan: Thigh hematoma, left, initial encounter Noted large L thigh hematoma s/p Stent placement x 2 to the left SFA and popliteal arteries 01/30/23  Vascular surgery consulted- no intervention at present  Hold offending agents including ASA and plavix  Trend hgb  Transfuse for  hgb <7  Follow up vascular surgery recommendations    Acute blood loss anemia Hgb 14.3-->7.8 in setting of L thigh hematoma  Trend hgb Transfuse for hgb <7  Hold offending agents including ASA and plavix  Hemoccult pending per vascular surgery recommendations- no reported black/bloody stools  Anemia panel  Follow     Popliteal aneurysm (HCC) Stent placement x 2 to the left SFA and popliteal arteries to treat both the occlusive disease and the aneurysm on 01/30/23 w/ vascular surgery  Now w/ proximal moderate to large hematoma  Hold ASA and plavix  Follow up vascular surgery recommendations    Essential hypertension BP stable  Monitor   Hyperlipidemia Cont statin      Greater than 50% was spent in counseling and coordination of care with patient Total encounter time 80 minutes or more    Advance Care Planning:   Code Status: Prior   Consults: Vascular Surgery   Family Communication: Family at the bedside   Severity of Illness: The appropriate patient status for this patient is INPATIENT. Inpatient status is judged to be reasonable and necessary in order to provide the required intensity of service to ensure the patient's safety. The patient's presenting symptoms, physical exam findings, and initial radiographic and laboratory data in the context of their chronic comorbidities is felt to place them at high risk for further clinical deterioration. Furthermore, it is not anticipated that the patient will be medically stable for  discharge from the hospital within 2 midnights of admission.   * I certify that at the point of admission it is my clinical judgment that the patient will require inpatient hospital care spanning beyond 2 midnights from the point of admission due to high intensity of service, high risk for further deterioration and high frequency of surveillance required.*  Author: Floydene Flock, MD 02/08/2023 12:40 PM  For on call review [email or web address].

## 2023-02-08 NOTE — ED Provider Notes (Signed)
[facility] Provider Note    Event Date/Time   First MD Initiated Contact with Patient 02/08/23 [phone]     (approximate)   History   Post-op Problem   HPI  Jerry [name] is a 89 y.o. male   Past medical history of Paget's disease, abdominal aortic aneurysm, hypertension hyperlipidemia, and a recent left lower extremity angiogram with stent placement to the left SFA and popliteal arteries to treat occlusive disease and aneurysm on 01/30/2023 by Dr. Wyn Quaker of vascular surgery presents the emergency department with left hip pain.  Left groin access site for the surgery has some residual bruising, mild pain since the surgery unchanged.  He was getting up from a chair when he felt a pop in his left groin/hip and is concerned that he broke a bone.  He had no other trauma or injuries noted.  The remainder of his left lower extremity is unchanged from the procedure and no significant pain.  Independent Historian contributed to assessment above: Both his wife and son are at bedside to corroborate information past medical history as above  External Medical Documents Reviewed: Surgery note from Dr. Wyn Quaker on 01/30/2023 for left lower extremity angiogram and stent placement for occlusive disease and aneurysm      Physical Exam   Triage Vital Signs: ED Triage Vitals  Encounter Vitals Group     BP 02/08/23 0309 135/66     Systolic BP Percentile --      Diastolic BP Percentile --      Pulse Rate 02/08/23 0309 82     Resp 02/08/23 0309 18     Temp 02/08/23 0309 (!) 97.5 F (36.4 C)     Temp Source 02/08/23 0309 Oral     SpO2 02/08/23 0309 100 %     Weight 02/08/23 0307 162 lb (73.5 kg)     Height 02/08/23 0307 5\' 6"  (1.676 m)     Head Circumference --      Peak Flow --      Pain Score 02/08/23 0306 9     Pain Loc --      Pain Education --      Exclude from Growth Chart --     Most recent vital signs: Vitals:   02/08/23 0309 02/08/23 0614  BP: 135/66 (!)  124/55  Pulse: 82 81  Resp: 18 14  Temp: (!) 97.5 F (36.4 C) 97.8 F (36.6 C)  SpO2: 100% 96%    General: Awake, no distress.  CV:  Good peripheral perfusion.  Resp:  Normal effort.  Abd:  No distention.  Other:  Unable to range left hip due to pain.  The surgical site groin access site with no obvious hematoma, some bruising distally, and there is some swelling to the left lower extremity, faint pulses palpable of the pedal area, warm and well-perfused otherwise.  I am able to range the left hip without eliciting significant pain.   ED Results / Procedures / Treatments   Labs (all labs ordered are listed, but only abnormal results are displayed) Labs Reviewed  BASIC METABOLIC PANEL  CBC WITH DIFFERENTIAL/PLATELET       RADIOLOGY I independently reviewed and interpreted XR hip and see bony erosive changes to the proximal femur I also reviewed radiologist's formal read.   PROCEDURES:  Critical Care performed: No  Procedures   MEDICATIONS ORDERED IN ED: Medications  ketorolac (TORADOL) 15 MG/ML injection 15 mg (15 mg Intramuscular Given 02/08/23 0549)  acetaminophen (TYLENOL)  tablet 650 mg (650 mg Oral Given 02/08/23 0546)  oxyCODONE (Oxy IR/ROXICODONE) immediate release tablet 5 mg (5 mg Oral Given 02/08/23 0546)     IMPRESSION / MDM / ASSESSMENT AND PLAN / ED COURSE  I reviewed the triage vital signs and the nursing notes.                                Patient's presentation is most consistent with acute presentation with potential threat to life or bodily function.  Differential diagnosis includes, but is not limited to, hip fracture or dislocation, groin access site complication like pseudoaneurysm, bleeding, hematoma, considered but less likely ischemic limb   The patient is on the cardiac monitor to evaluate for evidence of arrhythmia and/or significant heart rate changes.  MDM:    This is a patient with left hip pain which may be due to his bones  and that he fractured something or dislocated something with a history of Paget's disease is at higher risk.  Also may be due to the groin access site procedural complication like hematoma or pseudoaneurysm.  Will assess for both with x-rays and ultrasound.  Doubt limb ischemia given well-perfused lower extremity no significant pain other than the groin site/hip site only.  -- Shows no fractures or dislocations, shows unchanged uncomplicated Paget's disease of the proximal femur but does show a large hematoma.  No pseudoaneurysm.  Given the acuity of pain and large hematoma, will consult with vascular surgery to review imaging, assess patient, give recommendations.     FINAL CLINICAL IMPRESSION(S) / ED DIAGNOSES   Final diagnoses:  Left hip pain  Thigh hematoma, left, initial encounter     Rx / DC Orders   ED Discharge Orders     None        Note:  This document was prepared using Dragon voice recognition software and may include unintentional dictation errors.    Pilar Jarvis, MD 02/08/23 [phone]

## 2023-02-08 NOTE — Assessment & Plan Note (Addendum)
Stent placement x 2 to the left SFA and popliteal arteries to treat both the occlusive disease and the aneurysm on 01/30/23 w/ vascular surgery  Now w/ proximal moderate to large hematoma  Minimize offending medications  Cont baby ASA per vascular surgery recommendations as to avoid stent thrombosis

## 2023-02-08 NOTE — Plan of Care (Signed)

## 2023-02-08 NOTE — ED Triage Notes (Signed)
Pt to ED via EMS from home, pt had stent placed in L groin on 12/09, pt got up to use restroom tonight and when he was returning to recliner began to have pain at incision site. Pt is on plavix. Pt was given fentanyl and 4mg  zofran by ems

## 2023-02-08 NOTE — Assessment & Plan Note (Addendum)
Hgb 14.3-->7.8 in setting of L thigh hematoma  Trend hgb Transfuse for hgb <7  Minimize offending medications  Cont baby asa per vascular surgery recommendations  Hemoccult pending per vascular surgery recommendations- no reported black/bloody stools  Anemia panel  Follow

## 2023-02-09 DIAGNOSIS — T148XXA Other injury of unspecified body region, initial encounter: Secondary | ICD-10-CM | POA: Diagnosis not present

## 2023-02-09 LAB — BPAM RBC
Blood Product Expiration Date: 202501122359
ISSUE DATE / TIME: 202412181259
Unit Type and Rh: 6200

## 2023-02-09 LAB — COMPREHENSIVE METABOLIC PANEL
ALT: 26 U/L (ref 0–44)
AST: 27 U/L (ref 15–41)
Albumin: 2.4 g/dL — ABNORMAL LOW (ref 3.5–5.0)
Alkaline Phosphatase: 100 U/L (ref 38–126)
Anion gap: 5 (ref 5–15)
BUN: 22 mg/dL (ref 8–23)
CO2: 22 mmol/L (ref 22–32)
Calcium: 8 mg/dL — ABNORMAL LOW (ref 8.9–10.3)
Chloride: 109 mmol/L (ref 98–111)
Creatinine, Ser: 0.99 mg/dL (ref 0.61–1.24)
GFR, Estimated: >60 mL/min (ref >60)
Glucose, Bld: 96 mg/dL (ref 70–99)
Potassium: 4.1 mmol/L (ref 3.5–5.1)
Sodium: 136 mmol/L (ref 135–145)
Total Bilirubin: 1.1 mg/dL (ref <1.2)
Total Protein: 4.7 g/dL — ABNORMAL LOW (ref 6.5–8.1)

## 2023-02-09 LAB — TYPE AND SCREEN
ABO/RH(D): A POS
Antibody Screen: NEGATIVE
Unit division: 0

## 2023-02-09 LAB — HEMOGLOBIN AND HEMATOCRIT, BLOOD
HCT: 25.1 % — ABNORMAL LOW (ref 39.0–52.0)
HCT: 25.3 % — ABNORMAL LOW (ref 39.0–52.0)
HCT: 26.4 % — ABNORMAL LOW (ref 39.0–52.0)
Hemoglobin: 8.4 g/dL — ABNORMAL LOW (ref 13.0–17.0)
Hemoglobin: 8.5 g/dL — ABNORMAL LOW (ref 13.0–17.0)
Hemoglobin: 8.7 g/dL — ABNORMAL LOW (ref 13.0–17.0)

## 2023-02-09 MED ORDER — ATORVASTATIN CALCIUM 20 MG PO TABS
40.0000 mg | ORAL_TABLET | Freq: Every day | ORAL | Status: DC
  Administered 2023-02-09: 40 mg via ORAL
  Filled 2023-02-09: qty 2

## 2023-02-09 NOTE — Evaluation (Signed)
Physical Therapy Evaluation Patient Details Name: Jerry [name] MRN: [id number] DOB: 04-Feb-1929 Today's Date: 02/09/2023  History of Present Illness  Pt is a 89 y.o. male presenting to hospital 02/08/23 with c/o L hip pain; recent L LE angiogram with stent placement to the L SFA and popliteal arteries to treat occlusive disease and aneurysm on 01/30/23; pt was getting up from a chair when he felt a pop.  Pt admitted with L thigh hematoma and acute blood loss anemia.  PMH includes Paget's disease, AAA repair, htn, HLD.  Clinical Impression  Prior to recent medical concerns, pt was independent with ambulation; no recent falls reported; lives with his wife on main level of home with 5-6 STE with B railings.  L hip/thigh pain 0/10 at rest beginning/end of session (pt reporting 4/10 L LE "stiffness" during ambulation).  Currently pt is modified independent with bed mobility; CGA with transfers; CGA progressing to SBA with ambulation in hallway using RW; and CGA to navigate stairs with use of railings.  Pt would currently benefit from skilled PT to address noted impairments and functional limitations (see below for any additional details).  Upon hospital discharge, pt would benefit from ongoing therapy.     If plan is discharge home, recommend the following: A little help with walking and/or transfers;A little help with bathing/dressing/bathroom;Assistance with cooking/housework;Assist for transportation;Help with stairs or ramp for entrance   Can travel by private vehicle    Yes    Equipment Recommendations Rolling walker (2 wheels)  Recommendations for Other Services       Functional Status Assessment Patient has had a recent decline in their functional status and demonstrates the ability to make significant improvements in function in a reasonable and predictable amount of time.     Precautions / Restrictions Precautions Precautions: Fall Restrictions Weight Bearing Restrictions Per  Provider Order: No      Mobility  Bed Mobility Overal bed mobility: Modified Independent             General bed mobility comments: Semi-supine to/from sitting with mild increased effort to perform on own    Transfers Overall transfer level: Needs assistance Equipment used: Rolling walker (2 wheels) Transfers: Sit to/from Stand Sit to Stand: Contact guard assist           General transfer comment: x2 trials standing from bed; initial vc's for UE/LE placement and overall technique    Ambulation/Gait Ambulation/Gait assistance: Contact guard assist, Supervision Gait Distance (Feet): 320 Feet Assistive device: Rolling walker (2 wheels) Gait Pattern/deviations: Step-through pattern Gait velocity: decreased     General Gait Details: mild decreased stance time L LE; steady with RW use  Stairs Stairs: Yes Stairs assistance: Contact guard assist Stair Management: Two rails, Step to pattern, Forwards Number of Stairs: 8 General stair comments: initial vc's for LE sequencing/technique; steady/safe stairs navigation  Wheelchair Mobility     Tilt Bed    Modified Rankin (Stroke Patients Only)       Balance Overall balance assessment: Needs assistance Sitting-balance support: No upper extremity supported, Feet supported Sitting balance-Leahy Scale: Normal Sitting balance - Comments: steady reaching outside BOS   Standing balance support: Bilateral upper extremity supported, During functional activity Standing balance-Leahy Scale: Good Standing balance comment: steady ambulating with RW use                             Pertinent Vitals/Pain Pain Assessment Pain Assessment: 0-10 Pain Score: 0-No pain (  0/10 at rest; 4/10 "stiffness" with activity) Pain Location: L thigh Pain Descriptors / Indicators: Other (Comment) (stiffness) Pain Intervention(s): Limited activity within patient's tolerance, Monitored during session, Repositioned, Other (comment)  (L LE elevated via pillow) HR 83-96 bpm and SpO2 sats 94% or greater on room air during sessions activities.    Home Living Family/patient expects to be discharged to:: Private residence Living Arrangements: Spouse/significant other Available Help at Discharge: Family;Available 24 hours/day Type of Home: House Home Access: Stairs to enter Entrance Stairs-Rails: Right;Left;Can reach both Entrance Stairs-Number of Steps: 5-6   Home Layout: Two level;Able to live on main level with bedroom/bathroom Home Equipment: Grab bars - tub/shower      Prior Function Prior Level of Function : Independent/Modified Independent             Mobility Comments: No recent falls reported; independent and active ADLs Comments: Independent     Extremity/Trunk Assessment   Upper Extremity Assessment Upper Extremity Assessment: Overall WFL for tasks assessed    Lower Extremity Assessment Lower Extremity Assessment: LLE deficits/detail (R LE WFL) LLE Deficits / Details: at least 3/5 AROM hip flexion, knee flexion/extension, and DF    Cervical / Trunk Assessment Cervical / Trunk Assessment: Normal  Communication   Communication Communication: Hearing impairment (HOH) Cueing Techniques: Verbal cues;Visual cues  Cognition Arousal: Alert Behavior During Therapy: WFL for tasks assessed/performed Overall Cognitive Status: Within Functional Limits for tasks assessed                                          General Comments General comments (skin integrity, edema, etc.): L hip/thigh bruising noted; entire L LE overall swollen compared to R LE.  Nursing cleared pt for participation in physical therapy.  Pt agreeable to PT session.  Pt's wife and daughter present during session.    Exercises  Gait and stair training   Assessment/Plan    PT Assessment Patient needs continued PT services  PT Problem List Decreased strength;Decreased activity tolerance;Decreased balance;Decreased  mobility;Decreased knowledge of use of DME;Decreased knowledge of precautions;Pain       PT Treatment Interventions DME instruction;Gait training;Stair training;Functional mobility training;Therapeutic activities;Therapeutic exercise;Balance training;Patient/family education    PT Goals (Current goals can be found in the Care Plan section)  Acute Rehab PT Goals Patient Stated Goal: to improve walking and L LE symptoms PT Goal Formulation: With patient Time For Goal Achievement: 02/23/23 Potential to Achieve Goals: Good    Frequency Min 1X/week     Co-evaluation               AM-PAC PT "6 Clicks" Mobility  Outcome Measure Help needed turning from your back to your side while in a flat bed without using bedrails?: None Help needed moving from lying on your back to sitting on the side of a flat bed without using bedrails?: None Help needed moving to and from a bed to a chair (including a wheelchair)?: A Little Help needed standing up from a chair using your arms (e.g., wheelchair or bedside chair)?: A Little Help needed to walk in hospital room?: A Little Help needed climbing 3-5 steps with a railing? : A Little 6 Click Score: 20    End of Session Equipment Utilized During Treatment: Gait belt Activity Tolerance: Patient tolerated treatment well Patient left: in bed;with call bell/phone within reach;with bed alarm set;with family/visitor present;Other (comment) (L LE elevated via  pillow) Nurse Communication: Mobility status;Precautions PT Visit Diagnosis: Other abnormalities of gait and mobility (R26.89);Muscle weakness (generalized) (M62.81);Pain Pain - Right/Left: Left Pain - part of body: Hip;Leg    Time: 4098-1191 PT Time Calculation (min) (ACUTE ONLY): 36 min   Charges:   PT Evaluation $PT Eval Low Complexity: 1 Low PT Treatments $Gait Training: 8-22 mins PT General Charges $$ ACUTE PT VISIT: 1 Visit        Hendricks Limes, PT 02/09/23, 9:50 AM

## 2023-02-09 NOTE — Progress Notes (Signed)
PROGRESS NOTE    Jerry [name]  ZOX:[id number] DOB: [birth year] DOA: 02/08/2023 PCP: Lauro Regulus, MD   Assessment & Plan:   Principal Problem:   Hematoma Active Problems:   Thigh hematoma, left, initial encounter   Acute blood loss anemia   Popliteal aneurysm (HCC)   Hyperlipidemia   Essential hypertension  Assessment and Plan: Left thigh hematoma: s/p stent placement x 2 left SFA & popliteal arteries 01/30/23. Holding plavix and continue on aspirin as per vasc surg. H&H are labile and will continue to monitor. Vasc surg following and recs apprec    Acute blood loss anemia: likely secondary to above. Will transfuse if Hb < 7.0. Holding plavix and continue on aspirin as per vasc surg   Popliteal aneurysm: s/p stent placement x 2 to left SFA & popliteal arteries to treat both the occlusive disease & aneurysm on 01/30/23 as per vasc surg    HTN: holding home diltiazem as BP is low normal    HLD: continue on home dose of statin           DVT prophylaxis: SCDS Code Status: full  Family Communication: discussed pt's care w/ pt's family at bedside and answered their questions  Disposition Plan: likely d/c back home   Level of care: Med-Surg  Status is: Inpatient Remains inpatient appropriate because: severity of illness    Consultants:  Vasc surg   Procedures:   Antimicrobials:    Subjective: Pt c/o fatigue  Objective: Vitals:   02/08/23 1457 02/08/23 1747 02/08/23 2314 02/09/23 0751  BP: 136/79 (!) 166/73 (!) 114/54 (!) 128/53  Pulse: 85 92 90 79  Resp: 18 18 20 18   Temp: 98.3 F (36.8 C) 98.3 F (36.8 C) 98.3 F (36.8 C) 97.9 F (36.6 C)  TempSrc: Oral Oral    SpO2:  99% 95% 98%  Weight:      Height:        Intake/Output Summary (Last 24 hours) at 02/09/2023 0829 Last data filed at 02/09/2023 0131 Gross per 24 hour  Intake 75.93 ml  Output 125 ml  Net -49.07 ml   Filed Weights   02/08/23 0307  Weight: 73.5 kg     Examination:  General exam: Appears calm and comfortable  Respiratory system: Clear to auscultation. Respiratory effort normal. Cardiovascular system: S1 & S2+. No rubs, gallops or clicks.  Gastrointestinal system: Abdomen is nondistended, soft and nontender. Normal bowel sounds heard. Central nervous system: Alert and oriented. Moves all extremities  Psychiatry: Judgement and insight appear normal. Mood & affect appropriate.     Data Reviewed: I have personally reviewed following labs and imaging studies  CBC: Recent Labs  Lab 02/08/23 0702 02/08/23 1029 02/08/23 1558 02/08/23 1836 02/09/23 0023 02/09/23 0446  WBC 15.4* 12.3*  --   --   --   --   NEUTROABS 13.3*  --   --   --   --   --   HGB 7.8* 7.1* 9.6* 9.4* 8.4* 8.5*  HCT 24.2* 23.1* 29.7* 28.7* 25.3* 25.1*  MCV 96.8 101.3*  --   --   --   --   PLT 381 340  --   --   --   --    Basic Metabolic Panel: Recent Labs  Lab 02/08/23 0702 02/09/23 0446  NA 135 136  K 4.3 4.1  CL 105 109  CO2 23 22  GLUCOSE 116* 96  BUN 21 22  CREATININE 1.03 0.99  CALCIUM 8.3* 8.0*  GFR: Estimated Creatinine Clearance: 43 mL/min (by C-G formula based on SCr of 0.99 mg/dL). Liver Function Tests: Recent Labs  Lab 02/09/23 0446  AST 27  ALT 26  ALKPHOS 100  BILITOT 1.1  PROT 4.7*  ALBUMIN 2.4*   No results for input(s): "LIPASE", "AMYLASE" in the last 168 hours. No results for input(s): "AMMONIA" in the last 168 hours. Coagulation Profile: No results for input(s): "INR", "PROTIME" in the last 168 hours. Cardiac Enzymes: No results for input(s): "CKTOTAL", "CKMB", "CKMBINDEX", "TROPONINI" in the last 168 hours. BNP (last 3 results) No results for input(s): "PROBNP" in the last 8760 hours. HbA1C: No results for input(s): "HGBA1C" in the last 72 hours. CBG: No results for input(s): "GLUCAP" in the last 168 hours. Lipid Profile: No results for input(s): "CHOL", "HDL", "LDLCALC", "TRIG", "CHOLHDL", "LDLDIRECT" in the  last 72 hours. Thyroid Function Tests: No results for input(s): "TSH", "T4TOTAL", "FREET4", "T3FREE", "THYROIDAB" in the last 72 hours. Anemia Panel: Recent Labs    02/08/23 1558  VITAMINB12 758  FOLATE 25.0  FERRITIN 64  TIBC 270  IRON 67  RETICCTPCT 4.6*   Sepsis Labs: No results for input(s): "PROCALCITON", "LATICACIDVEN" in the last 168 hours.  No results found for this or any previous visit (from the past 240 hours).       Radiology Studies: US PELVIS LIMITED (TRANSABDOMINAL ONLY) Result Date: 02/08/2023 CLINICAL DATA:  Swelling after arterial access in the left groin EXAM: LIMITED ULTRASOUND OF PELVIS TECHNIQUE: Limited transabdominal ultrasound examination of the pelvis was performed. COMPARISON:  Noncontrast pelvis/hip CT 02/08/2023 FINDINGS: Avascular hypoechoic material medially anterior to an arterial structure (superficial femoral artery based on prior CT) with dimensions measuring 6 cm in length by approximately 2 cm in thickness. By CT this hematoma is contiguous with a large hematoma expanding the upper and anterior muscular compartment of the left thigh. No pseudoaneurysm seen by color Doppler. IMPRESSION: Large hematoma in the upper anterior compartment of the left thigh contiguous with hematoma anterior to the upper SFA. No detected pseudoaneurysm by color Doppler. Electronically Signed   By: Tiburcio Pea M.D.   On: 02/08/2023 06:27   CT Hip Left Wo Contrast Result Date: 02/08/2023 CLINICAL DATA:  Status post stent placement in the left groin region. Patient now has pain in the incision site. EXAM: CT OF THE LEFT HIP WITHOUT CONTRAST TECHNIQUE: Multidetector CT imaging of the left hip was performed according to the standard protocol. Multiplanar CT image reconstructions were also generated. RADIATION DOSE REDUCTION: This exam was performed according to the departmental dose-optimization program which includes automated exposure control, adjustment of the mA and/or  kV according to patient size and/or use of iterative reconstruction technique. COMPARISON:  None Available. FINDINGS: Bones/Joint/Cartilage There is no sign of acute fracture or dislocation. Cortical thickening and accentuation of trabecular markings are identified involving the imaged portions of the proximal left femur. Diffuse joint space narrowing is identified with mild marginal spur formation. Ligaments Suboptimally assessed by CT. Muscles and Tendons Partially visualized, heterogeneous, hyperdense mass is identified involving the anterior compartment muscles of the proximal left lower extremity. This measures 8.0 x 5.3 by 12.1 cm. Soft tissue stranding is identified within the overlying anterior and lateral superficial subcutaneous fat. Soft tissues Iliac limbs of aortic stent graft identified. Extensive arterial vascular calcifications identified. Small clips identified within the soft tissues surrounding the left femoral artery with adjacent fat stranding. IMPRESSION: 1. Partially visualized, heterogeneous, hyperdense mass is identified involving the anterior compartment muscles of the proximal  left lower extremity. This measures 8.0 x 5.3 x 12.1 cm. Imaging features are compatible with a hematoma. 2. No signs of acute fracture or dislocation. 3. 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 4. Extensive arterial vascular calcifications. 5. Small clips identified within the soft tissues surrounding the left femoral artery with adjacent fat stranding. Most likely related to recent vascular access. Electronically Signed   By: Signa Kell M.D.   On: 02/08/2023 06:08   DG Hip Unilat W or Wo Pelvis 2-3 Views Left Result Date: 02/08/2023 CLINICAL DATA:  Incision site pain from vascular access EXAM: DG HIP (WITH OR WITHOUT PELVIS) 3V LEFT COMPARISON:  08/31/2018 report, images not available FINDINGS: Limited pelvis due to rotation. No evidence of acute fracture or subluxation.  There is trabecular and cortical thickening and coarsening involving the proximal left femur, compatible with Paget's, also mentioned on prior hip series. Similar appearance is seen at the right femoral head which may also be less affected by Paget's. Atherosclerosis with aorta iliac and left SFA stenting. IMPRESSION: 1. No acute finding. 2. Paget's of the proximal left femur. Electronically Signed   By: Tiburcio Pea M.D.   On: 02/08/2023 04:27        Scheduled Meds:  aspirin EC  81 mg Oral Daily   Continuous Infusions:  sodium chloride 50 mL/hr at 02/08/23 1900     LOS: 1 day       Charise Killian, MD [facility] Pager [phone]  If 7PM-7AM, please contact night-coverage [email or web address] 02/09/2023, 8:29 AM

## 2023-02-09 NOTE — Plan of Care (Signed)

## 2023-02-09 NOTE — Progress Notes (Signed)
Progress Note    02/09/2023 3:38 PM * No surgery found *  Subjective:  This is a 89 y.o. male with past medical history of Paget's disease, abdominal aortic aneurysm, hypertension hyperlipidemia, and a recent left lower extremity angiogram with stent placement to the left SFA and popliteal arteries to treat occlusive disease and aneurysm on 01/30/2023 by Dr. Wyn Quaker of vascular surgery. He presents to the emergency department with left hip pain.   Left groin access site for the surgery has some residual bruising, mild pain since the surgery unchanged.  Noted erythema to anterior to lateral left thigh. Edema is +3 from groin area to knee with +2 pedal edema. He was getting up from a chair when he felt a pop in his left groin/hip and is concerned that he broke a bone.The pain worsened over the next 24 hours.    Vitals:   02/09/23 0751 02/09/23 1528  BP: (!) 128/53 (!) 141/67  Pulse: 79 78  Resp: 18 18  Temp: 97.9 F (36.6 C) 97.6 F (36.4 C)  SpO2: 98% 99%   Physical Exam: Cardiac:  RRR, normal S1 and S2, no murmurs appreciated. Lungs: Clear on auscultation throughout, normal nonlabored breathing, without rales rhonchi or wheezing. Incisions: Right groin puncture site from 01/30/2023 well-healed. Extremities: Positive Doppler pulses bilateral lower extremities due to +2 pedal edema.  Noted +3 edema to right thigh with noted hematoma measuring 8.0 x 5.3 x 12.1 to anterior compartment muscles of the left lower extremity.  Circumferential erythema in the right thigh as well as hip area. Abdomen: Positive bowel sounds throughout, soft, nontender and nondistended. Neurologic: Alert and oriented x 3, answers all questions and follows commands appropriately.  CBC    Component Value Date/Time   WBC 12.3 (H) 02/08/2023 1029   RBC 3.00 (L) 02/08/2023 1558   RBC 2.28 (L) 02/08/2023 1029   HGB 8.5 (L) 02/09/2023 0446   HCT 25.1 (L) 02/09/2023 0446   PLT 340 02/08/2023 1029   MCV 101.3 (H)  02/08/2023 1029   MCH 31.1 02/08/2023 1029   MCHC 30.7 02/08/2023 1029   RDW 13.7 02/08/2023 1029   LYMPHSABS 0.7 02/08/2023 0702   MONOABS 1.2 (H) 02/08/2023 0702   EOSABS 0.0 02/08/2023 0702   BASOSABS 0.0 02/08/2023 0702    BMET    Component Value Date/Time   NA 136 02/09/2023 0446   K 4.1 02/09/2023 0446   CL 109 02/09/2023 0446   CO2 22 02/09/2023 0446   GLUCOSE 96 02/09/2023 0446   BUN 22 02/09/2023 0446   CREATININE 0.99 02/09/2023 0446   CALCIUM 8.0 (L) 02/09/2023 0446   GFRNONAA >60 02/09/2023 0446   GFRAA >60 10/17/2018 2352    INR No results found for: "INR"   Intake/Output Summary (Last 24 hours) at 02/09/2023 1538 Last data filed at 02/09/2023 1453 Gross per 24 hour  Intake 75.93 ml  Output 875 ml  Net -799.07 ml     Assessment/Plan:  89 y.o. male is s/p left lower extremity angiogram from 01/30/2023.* No surgery found *patient presented to [facility] emergency room on 1218 with a spontaneous bleed to his left lower extremity anterior hamstring muscle.  No intervention at this time.  Patient was on aspirin and Plavix due to stent placement from prior angiogram.  Plavix was stopped but aspirin was continued.  Patient's hemoglobin had dropped to 7.1.  He was given 2 units of packed red blood cells.  Hemoglobin was then 9.7.  Hemoglobin is dropped to 8.4 but  elevated to 8.5 today.  Patient seems to be recovering as expected.  PLAN: Vascular surgery has ordered H&H at 6 PM this evening for continue following the patient's blood counts.  He also has a CBC ordered for 5 AM to continue following his blood counts.  If both of these are the same or elevated patient okay to go home per vascular surgery.  We prefer that the patient be on aspirin and Plavix due to the extensive stent placement in his left lower femoral as well as popliteal arteries.  Vascular surgery suggested patient restart his Plavix on Monday morning 02/12/2023.  If patient family refused to do so they were  instructed he at least needs to be on aspirin 81 mg daily.  We also discussed the use of heating pads and some type of warm moist heat applied to his leg to help with pain and dissipate hematoma.  Family and patient verbalized understanding.  We also discussed in detail patient ambulating 4-5 times a day at least getting out of the chair or bed every 2 hours as part of his recovery.  Again both family and the patient verbalized her understanding.  Patient has a follow-up appointment with vein and vascular surgery already scheduled on January 8.  I informed the patient and the family to follow-up at this appointment it would be important.  At this point time vascular surgery will sign off.  DVT prophylaxis: ASA 81 mg daily.   Marcie Bal [facility] 02/09/2023 3:38 PM

## 2023-02-10 DIAGNOSIS — T148XXA Other injury of unspecified body region, initial encounter: Secondary | ICD-10-CM | POA: Diagnosis not present

## 2023-02-10 LAB — CBC
HCT: 28.5 % — ABNORMAL LOW (ref 39.0–52.0)
Hemoglobin: 9.3 g/dL — ABNORMAL LOW (ref 13.0–17.0)
MCH: 32.3 pg (ref 26.0–34.0)
MCHC: 32.6 g/dL (ref 30.0–36.0)
MCV: 99 fL (ref 80.0–100.0)
Platelets: 398 10*3/uL (ref 150–400)
RBC: 2.88 MIL/uL — ABNORMAL LOW (ref 4.22–5.81)
RDW: 14.1 % (ref 11.5–15.5)
WBC: 10.9 10*3/uL — ABNORMAL HIGH (ref 4.0–10.5)
nRBC: 0 % (ref 0.0–0.2)

## 2023-02-10 LAB — URINALYSIS, W/ REFLEX TO CULTURE (INFECTION SUSPECTED)
Bacteria, UA: NONE SEEN
Bilirubin Urine: NEGATIVE
Glucose, UA: NEGATIVE mg/dL
Hgb urine dipstick: NEGATIVE
Ketones, ur: NEGATIVE mg/dL
Leukocytes,Ua: NEGATIVE
Nitrite: NEGATIVE
Protein, ur: NEGATIVE mg/dL
Specific Gravity, Urine: 1.004 — ABNORMAL LOW (ref 1.005–1.030)
Squamous Epithelial / HPF: 0 /[HPF] (ref 0–5)
pH: 6 (ref 5.0–8.0)

## 2023-02-10 MED ORDER — CLOPIDOGREL BISULFATE 75 MG PO TABS: 75.0000 mg | ORAL_TABLET | Freq: Every day | ORAL | Status: DC

## 2023-02-10 NOTE — Progress Notes (Signed)
Physical Therapy Treatment Patient Details Name: Jerry [name] MRN: [id number] DOB: 1928-03-03 Today's Date: 02/10/2023   History of Present Illness Pt is a 89 y.o. male presenting to hospital 02/08/23 with c/o L hip pain; recent L LE angiogram with stent placement to the L SFA and popliteal arteries to treat occlusive disease and aneurysm on 01/30/23; pt was getting up from a chair when he felt a pop.  Pt admitted with L thigh hematoma and acute blood loss anemia.  PMH includes Paget's disease, AAA repair, htn, HLD.    PT Comments  Pt resting in bed upon PT arrival; pt's wife and daughter present.  2/10 L thigh/LE pain reported during session.  Currently pt is modified independent with bed mobility; modified independent with transfers; SBA ambulating 320 feet with RW use; and SBA navigating steps with use of railings.  Plan for pt to discharge home today.  TOC and pt's nurse notified regarding pt's need for RW prior to discharge; TOC updated on therapy recommendations.    If plan is discharge home, recommend the following: A little help with walking and/or transfers;A little help with bathing/dressing/bathroom;Assistance with cooking/housework;Assist for transportation;Help with stairs or ramp for entrance   Can travel by private vehicle      Yes  Equipment Recommendations  Rolling walker (2 wheels)    Recommendations for Other Services       Precautions / Restrictions Precautions Precautions: Fall Restrictions Weight Bearing Restrictions Per Provider Order: No     Mobility  Bed Mobility Overal bed mobility: Modified Independent             General bed mobility comments: Semi-supine to/from sitting with mild increased effort to perform on own    Transfers Overall transfer level: Modified independent Equipment used: Rolling walker (2 wheels) Transfers: Sit to/from Stand Sit to Stand: Modified independent (Device/Increase time)           General transfer comment:  steady transfer from bed with RW use    Ambulation/Gait Ambulation/Gait assistance: Supervision Gait Distance (Feet): 320 Feet Assistive device: Rolling walker (2 wheels) Gait Pattern/deviations: Step-through pattern Gait velocity: mildly decreased     General Gait Details: mild decreased stance time L LE; steady with RW use   Stairs Stairs: Yes Stairs assistance: Supervision Stair Management: Two rails, Step to pattern, Forwards Number of Stairs: 4 General stair comments: appropriate LE sequencing/technique without cues; steady/safe stairs navigation   Wheelchair Mobility     Tilt Bed    Modified Rankin (Stroke Patients Only)       Balance Overall balance assessment: Needs assistance Sitting-balance support: No upper extremity supported, Feet supported Sitting balance-Leahy Scale: Normal Sitting balance - Comments: steady reaching outside BOS   Standing balance support: No upper extremity supported Standing balance-Leahy Scale: Good Standing balance comment: steady reaching within BOS                            Cognition Arousal: Alert Behavior During Therapy: WFL for tasks assessed/performed Overall Cognitive Status: Within Functional Limits for tasks assessed                                          Exercises      General Comments  Nursing cleared pt for participation in physical therapy.  Pt agreeable to PT session.  Pertinent Vitals/Pain Pain Assessment Pain Assessment: 0-10 Pain Score: 2  Pain Location: L thigh Pain Descriptors / Indicators: Sore Pain Intervention(s): Limited activity within patient's tolerance, Monitored during session, Repositioned    Home Living                          Prior Function            PT Goals (current goals can now be found in the care plan section) Acute Rehab PT Goals Patient Stated Goal: to improve walking and L LE symptoms PT Goal Formulation: With  patient Time For Goal Achievement: 02/23/23 Potential to Achieve Goals: Good Progress towards PT goals: Progressing toward goals    Frequency    Min 1X/week      PT Plan      Co-evaluation              AM-PAC PT "6 Clicks" Mobility   Outcome Measure  Help needed turning from your back to your side while in a flat bed without using bedrails?: None Help needed moving from lying on your back to sitting on the side of a flat bed without using bedrails?: None Help needed moving to and from a bed to a chair (including a wheelchair)?: None Help needed standing up from a chair using your arms (e.g., wheelchair or bedside chair)?: None Help needed to walk in hospital room?: A Little Help needed climbing 3-5 steps with a railing? : A Little 6 Click Score: 22    End of Session   Activity Tolerance: Patient tolerated treatment well Patient left: in bed;with call bell/phone within reach;with family/visitor present;Other (comment) (L LE elevated via pillows) Nurse Communication: Mobility status;Precautions PT Visit Diagnosis: Other abnormalities of gait and mobility (R26.89);Muscle weakness (generalized) (M62.81);Pain Pain - Right/Left: Left Pain - part of body: Hip;Leg     Time: 1350-1410 PT Time Calculation (min) (ACUTE ONLY): 20 min  Charges:    $Therapeutic Activity: 8-22 mins PT General Charges $$ ACUTE PT VISIT: 1 Visit                     Hendricks Limes, PT 02/10/23, 2:24 PM

## 2023-02-10 NOTE — Discharge Summary (Signed)
Physician Discharge Summary  Jerry [name] BJY:[id number] DOB: [birth year] DOA: 02/08/2023  PCP: Lauro Regulus, MD  Admit date: 02/08/2023 Discharge date: 02/10/2023  Admitted From: home  Disposition:  home   Recommendations for Outpatient Follow-up:  Follow up with PCP in 1-2 weeks F/u vasc surg, Dr. Wyn Quaker, on 03/01/23  Home Health: yes Equipment/Devices  Discharge Condition: stable  CODE STATUS: full  Diet recommendation: Heart Healthy   Brief/Interim Summary: HPI was taken from Dr. Alvester Morin: Jerry [name] is a 89 y.o. male with medical history significant of AAA, collagen vascular disease, hypertension, hyperlipidemia, popliteal aneurysm status post stenting presenting with left thigh hematoma.  Patient with noted evaluation for left popliteal aneurysm with vascular surgery on December 9.  Had stent placement x 2 to the left SFA and popliteal arteries to treat both occlusive disease and aneurysm.  Has had otherwise normal postoperative course.  Patient reports he was in the bathroom earlier today when going back to the recliner he felt a pop and significant pain near the incision site.  Has had worsening pain swelling and bruising.  Noted to have been started on aspirin and Plavix at discharge from procedure.  No chest pain or shortness of breath.  No nausea or vomiting.  No abdominal pain.  No focal hemiparesis or confusion.  Has noticed worsening bruising of the left groin and thigh. Presented to the ER afebrile, hemodynamically stable.  White count 15.4, hemoglobin 7.8 with baseline hemoglobin around 14.  CT of left hip with 8 x 5.3 x 4 cm area concerning for hematoma.  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  Discharge Diagnoses:  Principal Problem:   Hematoma Active Problems:   Thigh hematoma, left, initial encounter   Acute blood loss anemia   Popliteal aneurysm (HCC)   Hyperlipidemia   Essential hypertension  Left thigh hematoma: s/p stent placement x 2 left SFA &  popliteal arteries 01/30/23. Holding plavix but can restart on 02/13/23 as per vasc surg and continue on aspirin as per vasc surg. H&H are trending up today and will continue to monitor. Vasc surg following and recs apprec    Acute blood loss anemia: likely secondary to above. Will transfuse if Hb < 7.0. Holding plavix & restart on 02/13/23 and continue on aspirin as per vasc surg   Popliteal aneurysm: s/p stent placement x 2 to left SFA & popliteal arteries to treat both the occlusive disease & aneurysm on 01/30/23 as per vasc surg    HTN: restart home dose of CCB at d/c    HLD: continue on home dose of statin       Discharge Instructions  Discharge Instructions     Diet - low sodium heart healthy   Complete by: As directed    Discharge instructions   Complete by: As directed    F/u w/ PCP in 1-2 weeks. Patient ambulating 4-5 times a day at least getting out of the chair or bed every 2 hours as part of his recovery. Follow-up in vein and vascular surgery clinic on January 8   Increase activity slowly   Complete by: As directed       Allergies as of 02/10/2023       Reactions   Ivp Dye [iodinated Contrast Media]    Simvastatin    REACTION: joint pains   Soap Itching   fragrant soaps        Medication List     TAKE these medications    aspirin EC  81 MG tablet Take 81 mg by mouth daily.   atorvastatin 40 MG tablet Commonly known as: LIPITOR Take 40 mg by mouth daily.   clopidogrel 75 MG tablet Commonly known as: Plavix Take 1 tablet (75 mg total) by mouth daily. Restart on 02/13/23 What changed: additional instructions   diltiazem 240 MG 24 hr capsule Commonly known as: CARDIZEM CD Take 240 mg by mouth daily.   pantoprazole 40 MG tablet Commonly known as: PROTONIX Take 40 mg by mouth daily.        Allergies  Allergen Reactions   Ivp Dye [Iodinated Contrast Media]    Simvastatin     REACTION: joint pains   Soap Itching    fragrant soaps     Consultations: Vasc surg    Procedures/Studies: US PELVIS LIMITED (TRANSABDOMINAL ONLY) Result Date: 02/08/2023 CLINICAL DATA:  Swelling after arterial access in the left groin EXAM: LIMITED ULTRASOUND OF PELVIS TECHNIQUE: Limited transabdominal ultrasound examination of the pelvis was performed. COMPARISON:  Noncontrast pelvis/hip CT 02/08/2023 FINDINGS: Avascular hypoechoic material medially anterior to an arterial structure (superficial femoral artery based on prior CT) with dimensions measuring 6 cm in length by approximately 2 cm in thickness. By CT this hematoma is contiguous with a large hematoma expanding the upper and anterior muscular compartment of the left thigh. No pseudoaneurysm seen by color Doppler. IMPRESSION: Large hematoma in the upper anterior compartment of the left thigh contiguous with hematoma anterior to the upper SFA. No detected pseudoaneurysm by color Doppler. Electronically Signed   By: Tiburcio Pea M.D.   On: 02/08/2023 06:27   CT Hip Left Wo Contrast Result Date: 02/08/2023 CLINICAL DATA:  Status post stent placement in the left groin region. Patient now has pain in the incision site. EXAM: CT OF THE LEFT HIP WITHOUT CONTRAST TECHNIQUE: Multidetector CT imaging of the left hip was performed according to the standard protocol. Multiplanar CT image reconstructions were also generated. RADIATION DOSE REDUCTION: This exam was performed according to the departmental dose-optimization program which includes automated exposure control, adjustment of the mA and/or kV according to patient size and/or use of iterative reconstruction technique. COMPARISON:  None Available. FINDINGS: Bones/Joint/Cartilage There is no sign of acute fracture or dislocation. Cortical thickening and accentuation of trabecular markings are identified involving the imaged portions of the proximal left femur. Diffuse joint space narrowing is identified with mild marginal spur formation. Ligaments  Suboptimally assessed by CT. Muscles and Tendons Partially visualized, heterogeneous, hyperdense mass is identified involving the anterior compartment muscles of the proximal left lower extremity. This measures 8.0 x 5.3 by 12.1 cm. Soft tissue stranding is identified within the overlying anterior and lateral superficial subcutaneous fat. Soft tissues Iliac limbs of aortic stent graft identified. Extensive arterial vascular calcifications identified. Small clips identified within the soft tissues surrounding the left femoral artery with adjacent fat stranding. IMPRESSION: 1. Partially visualized, heterogeneous, hyperdense mass is identified involving the anterior compartment muscles of the proximal left lower extremity. This measures 8.0 x 5.3 x 12.1 cm. Imaging features are compatible with a hematoma. 2. No signs of acute fracture or dislocation. 3. 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 4. Extensive arterial vascular calcifications. 5. Small clips identified within the soft tissues surrounding the left femoral artery with adjacent fat stranding. Most likely related to recent vascular access. Electronically Signed   By: Signa Kell M.D.   On: 02/08/2023 06:08   DG Hip Unilat W or Wo Pelvis 2-3 Views Left Result  Date: 02/08/2023 CLINICAL DATA:  Incision site pain from vascular access EXAM: DG HIP (WITH OR WITHOUT PELVIS) 3V LEFT COMPARISON:  08/31/2018 report, images not available FINDINGS: Limited pelvis due to rotation. No evidence of acute fracture or subluxation. There is trabecular and cortical thickening and coarsening involving the proximal left femur, compatible with Paget's, also mentioned on prior hip series. Similar appearance is seen at the right femoral head which may also be less affected by Paget's. Atherosclerosis with aorta iliac and left SFA stenting. IMPRESSION: 1. No acute finding. 2. Paget's of the proximal left femur. Electronically Signed   By:  Tiburcio Pea M.D.   On: 02/08/2023 04:27   PERIPHERAL VASCULAR CATHETERIZATION Result Date: 01/30/2023 See surgical note for result.  (Echo, Carotid, EGD, Colonoscopy, ERCP)    Subjective: Pt c/o fatigue    Discharge Exam: Vitals:   02/10/23 0110 02/10/23 0856  BP: (!) 120/54 (!) 159/68  Pulse: 77 79  Resp: 16 18  Temp: 98.1 F (36.7 C) (!) 97.3 F (36.3 C)  SpO2: 98% 100%   Vitals:   02/09/23 0751 02/09/23 1528 02/10/23 0110 02/10/23 0856  BP: (!) 128/53 (!) 141/67 (!) 120/54 (!) 159/68  Pulse: 79 78 77 79  Resp: 18 18 16 18   Temp: 97.9 F (36.6 C) 97.6 F (36.4 C) 98.1 F (36.7 C) (!) 97.3 F (36.3 C)  TempSrc:      SpO2: 98% 99% 98% 100%  Weight:      Height:        General: Pt is alert, awake, not in acute distress Cardiovascular: S1/S2 +, no rubs, no gallops Respiratory: CTA bilaterally, no wheezing, no rhonchi Abdominal: Soft, NT, ND, bowel sounds + Extremities: b/l LE edema, no cyanosis    The results of significant diagnostics from this hospitalization (including imaging, microbiology, ancillary and laboratory) are listed below for reference.     Microbiology: No results found for this or any previous visit (from the past 240 hours).   Labs: BNP (last 3 results) No results for input(s): "BNP" in the last 8760 hours. Basic Metabolic Panel: Recent Labs  Lab 02/08/23 0702 02/09/23 0446  NA 135 136  K 4.3 4.1  CL 105 109  CO2 23 22  GLUCOSE 116* 96  BUN 21 22  CREATININE 1.03 0.99  CALCIUM 8.3* 8.0*   Liver Function Tests: Recent Labs  Lab 02/09/23 0446  AST 27  ALT 26  ALKPHOS 100  BILITOT 1.1  PROT 4.7*  ALBUMIN 2.4*   No results for input(s): "LIPASE", "AMYLASE" in the last 168 hours. No results for input(s): "AMMONIA" in the last 168 hours. CBC: Recent Labs  Lab 02/08/23 0702 02/08/23 1029 02/08/23 1558 02/08/23 1836 02/09/23 0023 02/09/23 0446 02/09/23 1821 02/10/23 0447  WBC 15.4* 12.3*  --   --   --   --   --   10.9*  NEUTROABS 13.3*  --   --   --   --   --   --   --   HGB 7.8* 7.1*   < > 9.4* 8.4* 8.5* 8.7* 9.3*  HCT 24.2* 23.1*   < > 28.7* 25.3* 25.1* 26.4* 28.5*  MCV 96.8 101.3*  --   --   --   --   --  99.0  PLT 381 340  --   --   --   --   --  398   < > = values in this interval not displayed.   Cardiac Enzymes: No results  for input(s): "CKTOTAL", "CKMB", "CKMBINDEX", "TROPONINI" in the last 168 hours. BNP: Invalid input(s): "POCBNP" CBG: No results for input(s): "GLUCAP" in the last 168 hours. D-Dimer No results for input(s): "DDIMER" in the last 72 hours. Hgb A1c No results for input(s): "HGBA1C" in the last 72 hours. Lipid Profile No results for input(s): "CHOL", "HDL", "LDLCALC", "TRIG", "CHOLHDL", "LDLDIRECT" in the last 72 hours. Thyroid function studies No results for input(s): "TSH", "T4TOTAL", "T3FREE", "THYROIDAB" in the last 72 hours.  Invalid input(s): "FREET3" Anemia work up Recent Labs    02/08/23 1558  VITAMINB12 758  FOLATE 25.0  FERRITIN 64  TIBC 270  IRON 67  RETICCTPCT 4.6*   Urinalysis    Component Value Date/Time   COLORURINE YELLOW (A) 02/10/2023 1046   APPEARANCEUR CLEAR (A) 02/10/2023 1046   LABSPEC 1.004 (L) 02/10/2023 1046   PHURINE 6.0 02/10/2023 1046   GLUCOSEU NEGATIVE 02/10/2023 1046   HGBUR NEGATIVE 02/10/2023 1046   BILIRUBINUR NEGATIVE 02/10/2023 1046   KETONESUR NEGATIVE 02/10/2023 1046   PROTEINUR NEGATIVE 02/10/2023 1046   NITRITE NEGATIVE 02/10/2023 1046   LEUKOCYTESUR NEGATIVE 02/10/2023 1046   Sepsis Labs Recent Labs  Lab 02/08/23 0702 02/08/23 1029 02/10/23 0447  WBC 15.4* 12.3* 10.9*   Microbiology No results found for this or any previous visit (from the past 240 hours).   Time coordinating discharge: Over 30 minutes  SIGNED:   Charise Killian, MD  [facility] 02/10/2023, 1:19 PM Pager   If 7PM-7AM, please contact night-coverage

## 2023-02-10 NOTE — TOC Transition Note (Signed)
Transition of Care [facility]) - Discharge Note   Patient Details  Name: Jerry [name] MRN: [id number] Date of Birth: 1928/07/15  [facility] CM/SW Contact:  Hetty Ely, RN Phone Number: 02/10/2023, 2:19 PM   Clinical Narrative:  Patient to discharge home with [facility] and rolling walker. Family to transport no other CM needs.     Final next level of care: Home w Home Health Services Barriers to Discharge: Barriers Resolved   Patient Goals and CMS Choice            Discharge Placement                    Patient and family notified of of transfer: 02/10/23  Discharge Plan and Services Additional resources added to the After Visit Summary for                  DME Arranged: Walker rolling DME Agency: [employer] Date DME Agency Contacted: 02/10/23   Representative spoke with at DME Agency: [facility] Arranged: PT [facility] Agency: [facility] Date [facility] Agency Contacted: 02/10/23   Representative spoke with at [facility] Agency: Elnita Maxwell  Social Drivers of Health (SDOH) Interventions SDOH Screenings   Food Insecurity: No Food Insecurity (02/08/2023)  Housing: Low Risk  (02/08/2023)  Transportation Needs: No Transportation Needs (02/08/2023)  Utilities: Not At Risk (02/08/2023)  Tobacco Use: Medium Risk (02/08/2023)     Readmission Risk Interventions     No data to display

## 2023-02-10 NOTE — Plan of Care (Signed)
  Problem: Education: Goal: Knowledge of General Education information will improve Description: Including pain rating scale, medication(s)/side effects and non-pharmacologic comfort measures Outcome: Progressing   Problem: Activity: Goal: Risk for activity intolerance will decrease Outcome: Progressing   

## 2023-02-10 NOTE — Plan of Care (Signed)

## 2023-02-23 ENCOUNTER — Other Ambulatory Visit (INDEPENDENT_AMBULATORY_CARE_PROVIDER_SITE_OTHER): Payer: Self-pay | Admitting: Vascular Surgery

## 2023-02-23 DIAGNOSIS — Z9889 Other specified postprocedural states: Secondary | ICD-10-CM

## 2023-03-01 ENCOUNTER — Ambulatory Visit (INDEPENDENT_AMBULATORY_CARE_PROVIDER_SITE_OTHER): Payer: Medicare Other | Admitting: Nurse Practitioner

## 2023-03-01 ENCOUNTER — Ambulatory Visit (INDEPENDENT_AMBULATORY_CARE_PROVIDER_SITE_OTHER): Payer: Medicare Other

## 2023-03-01 ENCOUNTER — Encounter (INDEPENDENT_AMBULATORY_CARE_PROVIDER_SITE_OTHER): Payer: Self-pay | Admitting: Nurse Practitioner

## 2023-03-01 VITALS — BP 144/79 | HR 73 | Resp 18 | Ht 65.5 in | Wt 158.6 lb

## 2023-03-01 DIAGNOSIS — I739 Peripheral vascular disease, unspecified: Secondary | ICD-10-CM | POA: Diagnosis not present

## 2023-03-01 DIAGNOSIS — I10 Essential (primary) hypertension: Secondary | ICD-10-CM | POA: Diagnosis not present

## 2023-03-01 DIAGNOSIS — Z9889 Other specified postprocedural states: Secondary | ICD-10-CM

## 2023-03-01 DIAGNOSIS — I724 Aneurysm of artery of lower extremity: Secondary | ICD-10-CM | POA: Diagnosis not present

## 2023-03-01 DIAGNOSIS — S7012XA Contusion of left thigh, initial encounter: Secondary | ICD-10-CM | POA: Diagnosis not present

## 2023-03-01 DIAGNOSIS — I723 Aneurysm of iliac artery: Secondary | ICD-10-CM

## 2023-03-01 NOTE — Progress Notes (Signed)
 Subjective:    Patient ID: Jerry [name], male    DOB: [birth year], 89 y.o.   MRN: [id number] Chief Complaint  Patient presents with   Follow-up    fu 4 weeks + ABI    The patient returns to the office for followup and review status post angiogram with intervention on 01/30/2023.   Procedure: Procedure(s) Performed:             1.  Ultrasound guidance for vascular access left femoral artery             2.  Selective left lower extremity angiogram             3.  Stent placement x 2 to the left SFA and popliteal arteries to treat both the occlusive disease and the aneurysm.             4.  StarClose closure device left femoral artery   The patient notes improvement in the lower extremity symptoms. No interval shortening of the patient's claudication distance or rest pain symptoms. No new ulcers or wounds have occurred since the last visit.  However, shortly after the procedure the patient had development of a spontaneous hematoma.  This happened following him standing up and feeling a popping sensation.  Initially was felt that this may be related to his angiogram but upon further scanning and review it was revealed that this was spontaneous in nature.  The patient notes that he still has some swelling in his leg but the pain is much improved from the initial presentation and he is able to ambulate without much difficulty.  There have been no significant changes to the patient's overall health care.  No documented history of amaurosis fugax or recent TIA symptoms. There are no recent neurological changes noted. No documented history of DVT, PE or superficial thrombophlebitis. The patient denies recent episodes of angina or shortness of breath.   ABI's Rt=1.15 and Lt=1.15   Duplex US  of the bilateral tibial vessels show strong triphasic waveforms bilaterally with normal toe waveforms bilaterally.  Ultrasound today also notes large hematoma with no internal flow extending from groin to  mid thigh level    Review of Systems  Cardiovascular:  Positive for leg swelling.  Musculoskeletal:  Positive for arthralgias.  All other systems reviewed and are negative.      Objective:   Physical Exam Vitals reviewed.  HENT:     Head: Normocephalic.  Cardiovascular:     Rate and Rhythm: Normal rate.     Pulses:          Dorsalis pedis pulses are detected w/ Doppler on the right side and detected w/ Doppler on the left side.       Posterior tibial pulses are detected w/ Doppler on the right side and detected w/ Doppler on the left side.  Pulmonary:     Effort: Pulmonary effort is normal.  Musculoskeletal:     Right lower leg: 1+ Edema present.     Left lower leg: 2+ Edema present.  Skin:    General: Skin is warm and dry.  Neurological:     Mental Status: He is alert and oriented to person, place, and time.  Psychiatric:        Mood and Affect: Mood normal.        Behavior: Behavior normal.        Thought Content: Thought content normal.        Judgment: Judgment normal.  BP (!) 144/79   Pulse 73   Resp 18   Ht 5' 5.5 (1.664 m)   Wt 158 lb 9.6 oz (71.9 kg)   BMI 25.99 kg/m   Past Medical History:  Diagnosis Date   AAA (abdominal aortic aneurysm) (HCC)    Aneurysm (HCC)    History of multiple vascular aneurysms. [name] involve the aorta, te iliac arteries, and the popliteal arteries.  The pt is s/p stent graft of an abdominal aortic aneurysm. All of his aneurysm follow up as been done at [facility].   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 This is manifested and is elevated, sed rate, pleuritis, Raynaud's pehnomenon and she does have a positive rheumatoid factor.  He is being treated with Plquenil for his collagen vascular disease   Colonic polyp    History of nephrolithiasis    Hyperlipidemia    Hypertension    Macular degeneration    Osteoarthrosis, hip    Left hip   Paget's disease     Social History    Socioeconomic History   Marital status: Married    Spouse name: Not on file   Number of children: Not on file   Years of education: Not on file   Highest education level: Not on file  Occupational History   Occupation: Retired  Tobacco Use   Smoking status: Former   Smokeless tobacco: Never   Tobacco comments:    Quit smoking more than 40 years ago  Substance and Sexual Activity   Alcohol use: Yes    Comment: Rarely   Drug use: Not on file   Sexual activity: Not on file  Other Topics Concern   Not on file  Social History Narrative   Married and lives in [place]   3 children and grandchildren   Social Drivers of Health   Financial Resource Strain: Not on file  Food Insecurity: No Food Insecurity (02/08/2023)   Hunger Vital Sign    Worried About Running Out of Food in the Last Year: Never true    Ran Out of Food in the Last Year: Never true  Transportation Needs: No Transportation Needs (02/08/2023)   PRAPARE - Administrator, Civil Service (Medical): No    Lack of Transportation (Non-Medical): No  Physical Activity: Not on file  Stress: Not on file  Social Connections: Not on file  Intimate Partner Violence: Not At Risk (02/08/2023)   Humiliation, Afraid, Rape, and Kick questionnaire    Fear of Current or Ex-Partner: No    Emotionally Abused: No    Physically Abused: No    Sexually Abused: No    Past Surgical History:  Procedure Laterality Date   ABDOMINAL AORTIC ANEURYSM REPAIR     DOPPLER ECHOCARDIOGRAPHY  2006   Ef greater than 60%. There are no regional wall motion abnormalities. There was mild mitral regurgitation, there is no aortic stenosis, aortic valve was mildly calcified   HERNIA REPAIR  1998   LOWER EXTREMITY ANGIOGRAPHY Left 01/30/2023   Procedure: Lower Extremity Angiography;  Surgeon: Marea Selinda RAMAN, MD;  Location: [facility] INVASIVE CV LAB;  Service: Cardiovascular;  Laterality: Left;   Lung Hamartoma      Family History  Problem  Relation Age of Onset   Heart attack Mother 87       MI   Alzheimer's disease Father     Allergies  Allergen Reactions   Ivp Dye [Iodinated Contrast Media]  Simvastatin     REACTION: joint pains   Soap Itching    fragrant soaps       Latest Ref Rng & Units 02/10/2023    4:47 AM 02/09/2023    6:21 PM 02/09/2023    4:46 AM  CBC  WBC 4.0 - 10.5 K/uL 10.9     Hemoglobin 13.0 - 17.0 g/dL 9.3  8.7  8.5   Hematocrit 39.0 - 52.0 % 28.5  26.4  25.1   Platelets 150 - 400 K/uL 398         CMP     Component Value Date/Time   NA 136 02/09/2023 0446   K 4.1 02/09/2023 0446   CL 109 02/09/2023 0446   CO2 22 02/09/2023 0446   GLUCOSE 96 02/09/2023 0446   BUN 22 02/09/2023 0446   CREATININE 0.99 02/09/2023 0446   CALCIUM  8.0 (L) 02/09/2023 0446   PROT 4.7 (L) 02/09/2023 0446   ALBUMIN 2.4 (L) 02/09/2023 0446   AST 27 02/09/2023 0446   ALT 26 02/09/2023 0446   ALKPHOS 100 02/09/2023 0446   BILITOT 1.1 02/09/2023 0446   GFRNONAA >60 02/09/2023 0446     VAS US  ABI WITH/WO TBI Result Date: 03/01/2023  LOWER EXTREMITY DOPPLER STUDY Patient Name:  Jerry [name]  Date of Exam:   03/01/2023 Medical Rec #: [id number]         Accession #:    [id number] Date of Birth: 12-26-1928         Patient Gender: M Patient Age:   39 years Exam Location:  [facility] Procedure:      VAS US  ABI WITH/WO TBI Referring Phys: SELINDA DEW --------------------------------------------------------------------------------  Indications: Peripheral artery disease.  Vascular Interventions: 01/30/23: Left SFA/popliteal stent x2 for pvd/aneurysm;. Performing Technologist: Elsie Churn RT, RDMS, RVT  Examination Guidelines: A complete evaluation includes at minimum, Doppler waveform signals and systolic blood pressure reading at the level of bilateral brachial, anterior tibial, and posterior tibial arteries, when vessel segments are accessible. Bilateral testing is considered an integral part of a complete  examination. Photoelectric Plethysmograph (PPG) waveforms and toe systolic pressure readings are included as required and additional duplex testing as needed. Limited examinations for reoccurring indications may be performed as noted.  ABI Findings: +---------+------------------+-----+---------+-------+ Right    Rt Pressure (mmHg)IndexWaveform Comment +---------+------------------+-----+---------+-------+ Brachial 144                                     +---------+------------------+-----+---------+-------+ PTA      166               1.15 triphasic        +---------+------------------+-----+---------+-------+ DP       165               1.15 triphasic        +---------+------------------+-----+---------+-------+ Great Toe103               0.72 Normal           +---------+------------------+-----+---------+-------+ +---------+------------------+-----+---------+-------+ Left     Lt Pressure (mmHg)IndexWaveform Comment +---------+------------------+-----+---------+-------+ Brachial 134                                     +---------+------------------+-----+---------+-------+ PTA      166  1.15 triphasic        +---------+------------------+-----+---------+-------+ DP       161               1.12 triphasic        +---------+------------------+-----+---------+-------+ Great Toe130               0.90 Normal           +---------+------------------+-----+---------+-------+ Large hematoma, with no internal flow, extending from the groin to mid thigh level noted.  Summary: Bilateral: Bilateral ankle-brachial indexes are within normal range. Bilateral toe-brachial indexes are within normal range. *See table(s) above for measurements and observations.  Electronically signed by Selinda Gu MD on 03/01/2023 at 2:14:12 PM.    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  We will evaluate  this with follow-up.  2. Popliteal aneurysm (HCC) Recommend:  The patient is status post successful angiogram with intervention.     The patient denies lifestyle limiting changes at this point in time.  No further invasive studies, angiography or surgery at this time. The patient should continue walking and begin a more formal exercise program.  The patient should continue antiplatelet therapy and aggressive treatment of the lipid abnormalities  Continued surveillance is indicated as atherosclerosis is likely to progress with time.    Patient should undergo noninvasive studies as ordered. The patient will follow up with me to review the studies.   3. Essential hypertension Continue antihypertensive medications as already ordered, these medications have been reviewed and there are no changes at this time.  4. Thigh hematoma, left, initial encounter We discussed that hematoma and that unfortunately it has to resolve over time.  No further medication or intervention recommended as he is currently doing well with this.  Patient is advised to continue with the conservative therapy such as compression elevation and activity as tolerable.   Current Outpatient Medications on File Prior to Visit  Medication Sig Dispense Refill   aspirin  81 MG EC tablet Take 81 mg by mouth daily.       atorvastatin  (LIPITOR) 40 MG tablet Take 40 mg by mouth daily.       clopidogrel  (PLAVIX ) 75 MG tablet Take 1 tablet (75 mg total) by mouth daily. Restart on 02/13/23     diltiazem  (CARDIZEM  CD) 240 MG 24 hr capsule Take 240 mg by mouth daily.     pantoprazole (PROTONIX) 40 MG tablet Take 40 mg by mouth daily.     No current facility-administered medications on file prior to visit.    There are no Patient Instructions on file for this visit. No follow-ups on file.   Catalea Labrecque E Elasha Tess, NP

## 2023-05-31 ENCOUNTER — Other Ambulatory Visit (INDEPENDENT_AMBULATORY_CARE_PROVIDER_SITE_OTHER): Payer: Self-pay | Admitting: Nurse Practitioner

## 2023-05-31 DIAGNOSIS — I724 Aneurysm of artery of lower extremity: Secondary | ICD-10-CM

## 2023-05-31 DIAGNOSIS — I739 Peripheral vascular disease, unspecified: Secondary | ICD-10-CM

## 2023-06-01 ENCOUNTER — Ambulatory Visit (INDEPENDENT_AMBULATORY_CARE_PROVIDER_SITE_OTHER): Payer: Medicare Other

## 2023-06-01 ENCOUNTER — Encounter (INDEPENDENT_AMBULATORY_CARE_PROVIDER_SITE_OTHER): Payer: Self-pay | Admitting: Nurse Practitioner

## 2023-06-01 ENCOUNTER — Ambulatory Visit (INDEPENDENT_AMBULATORY_CARE_PROVIDER_SITE_OTHER): Payer: Medicare Other | Admitting: Nurse Practitioner

## 2023-06-01 VITALS — BP 159/69 | HR 64 | Resp 18 | Ht 65.5 in | Wt 159.0 lb

## 2023-06-01 DIAGNOSIS — Z9889 Other specified postprocedural states: Secondary | ICD-10-CM

## 2023-06-01 DIAGNOSIS — I739 Peripheral vascular disease, unspecified: Secondary | ICD-10-CM

## 2023-06-01 DIAGNOSIS — I724 Aneurysm of artery of lower extremity: Secondary | ICD-10-CM

## 2023-06-01 DIAGNOSIS — I7143 Infrarenal abdominal aortic aneurysm, without rupture: Secondary | ICD-10-CM | POA: Diagnosis not present

## 2023-06-01 NOTE — Progress Notes (Signed)
 Subjective:    Patient ID: Jerry [name], male    DOB: 10-15-1928, 89 y.o.   MRN: [id number] Chief Complaint  Patient presents with   Follow-up    F/u 3 months  LEA & ABI     The patient returns to the office for followup and review status post angiogram with intervention on 01/30/2023.   Procedure: Procedure(s) Performed:             1.  Ultrasound guidance for vascular access left femoral artery             2.  Selective left lower extremity angiogram             3.  Stent placement x 2 to the left SFA and popliteal arteries to treat both the occlusive disease and the aneurysm.             4.  StarClose closure device left femoral artery   The patient notes improvement in the lower extremity symptoms. No interval shortening of the patient's claudication distance or rest pain symptoms. No new ulcers or wounds have occurred since the last visit.  The patient developed a spontaneous hematoma postprocedure but that is resolved at this time.  There have been no significant changes to the patient's overall health care.  No documented history of amaurosis fugax or recent TIA symptoms. There are no recent neurological changes noted. No documented history of DVT, PE or superficial thrombophlebitis. The patient denies recent episodes of angina or shortness of breath.   ABI's Rt=1.09 and Lt=1.09 Duplex US of the bilateral tibial vessels show strong triphasic waveforms bilaterally with normal toe waveforms bilaterally.  Arterial duplex shows SFA stents are widely patent.  No hematoma noted on ultrasound today.      Review of Systems  Cardiovascular:  Positive for leg swelling.  Musculoskeletal:  Positive for arthralgias.  All other systems reviewed and are negative.      Objective:   Physical Exam Vitals reviewed.  HENT:     Head: Normocephalic.  Cardiovascular:     Rate and Rhythm: Normal rate.     Pulses:          Dorsalis pedis pulses are detected w/ Doppler on the  right side and detected w/ Doppler on the left side.       Posterior tibial pulses are detected w/ Doppler on the right side and detected w/ Doppler on the left side.  Pulmonary:     Effort: Pulmonary effort is normal.  Musculoskeletal:     Right lower leg: 1+ Edema present.     Left lower leg: 2+ Edema present.  Skin:    General: Skin is warm and dry.  Neurological:     Mental Status: He is alert and oriented to person, place, and time.  Psychiatric:        Mood and Affect: Mood normal.        Behavior: Behavior normal.        Thought Content: Thought content normal.        Judgment: Judgment normal.     BP (!) 159/69   Pulse 64   Resp 18   Ht 5' 5.5" (1.664 m)   Wt 159 lb (72.1 kg)   BMI 26.06 kg/m   Past Medical History:  Diagnosis Date   AAA (abdominal aortic aneurysm) (HCC)    Aneurysm (HCC)    History of multiple vascular aneurysms. [name] involve the aorta, te iliac arteries, and the popliteal  arteries.  The pt is s/p stent graft of an abdominal aortic aneurysm. All of his aneurysm follow up as been done at [facility]   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 This is manifested and is elevated, sed rate, pleuritis, Raynaud's pehnomenon and she does have a positive rheumatoid factor.  He is being treated with Plquenil for his collagen vascular disease   Colonic polyp    History of nephrolithiasis    Hyperlipidemia    Hypertension    Macular degeneration    Osteoarthrosis, hip    Left hip   Paget's disease     Social History   Socioeconomic History   Marital status: Married    Spouse name: Not on file   Number of children: Not on file   Years of education: Not on file   Highest education level: Not on file  Occupational History   Occupation: Retired  Tobacco Use   Smoking status: Former   Smokeless tobacco: Never   Tobacco comments:    Quit smoking more than 40 years ago  Substance and Sexual Activity   Alcohol use:  Yes    Comment: Rarely   Drug use: Not on file   Sexual activity: Not on file  Other Topics Concern   Not on file  Social History Narrative   Married and lives in [place]   3 children and grandchildren   Social Drivers of Health   Financial Resource Strain: Low Risk  (04/07/2023)   Received from [facility]   Overall Financial Resource Strain (CARDIA)    Difficulty of Paying Living Expenses: Not hard at all  Food Insecurity: No Food Insecurity (04/07/2023)   Received from [facility]   Hunger Vital Sign    Worried About Running Out of Food in the Last Year: Never true    Ran Out of Food in the Last Year: Never true  Transportation Needs: No Transportation Needs (04/07/2023)   Received from [facility]    In the past 12 months, has lack of transportation kept you from medical appointments or from getting medications?: No    Lack of Transportation (Non-Medical): No  Physical Activity: Not on file  Stress: Not on file  Social Connections: Not on file  Intimate Partner Violence: Not At Risk (02/08/2023)   Humiliation, Afraid, Rape, and Kick questionnaire    Fear of Current or Ex-Partner: No    Emotionally Abused: No    Physically Abused: No    Sexually Abused: No    Past Surgical History:  Procedure Laterality Date   ABDOMINAL AORTIC ANEURYSM REPAIR     DOPPLER ECHOCARDIOGRAPHY  2006   Ef greater than 60%. There are no regional wall motion abnormalities. There was mild mitral regurgitation, there is no aortic stenosis, aortic valve was mildly calcified   HERNIA REPAIR  1998   LOWER EXTREMITY ANGIOGRAPHY Left 01/30/2023   Procedure: Lower Extremity Angiography;  Surgeon: Annice Needy, MD;  Location: [facility] INVASIVE CV LAB;  Service: Cardiovascular;  Laterality: Left;   Lung Hamartoma      Family History  Problem Relation Age of Onset   Heart attack Mother 69       MI   Alzheimer's disease  Father     Allergies  Allergen Reactions   Ivp Dye [Iodinated Contrast Media]    Simvastatin     REACTION: joint pains   Soap Itching  fragrant soaps       Latest Ref Rng & Units 02/10/2023    4:47 AM 02/09/2023    6:21 PM 02/09/2023    4:46 AM  CBC  WBC 4.0 - 10.5 K/uL 10.9     Hemoglobin 13.0 - 17.0 g/dL 9.3  8.7  8.5   Hematocrit 39.0 - 52.0 % 28.5  26.4  25.1   Platelets 150 - 400 K/uL 398         CMP     Component Value Date/Time   NA 136 02/09/2023 0446   K 4.1 02/09/2023 0446   CL 109 02/09/2023 0446   CO2 22 02/09/2023 0446   GLUCOSE 96 02/09/2023 0446   BUN 22 02/09/2023 0446   CREATININE 0.99 02/09/2023 0446   CALCIUM 8.0 (L) 02/09/2023 0446   PROT 4.7 (L) 02/09/2023 0446   ALBUMIN 2.4 (L) 02/09/2023 0446   AST 27 02/09/2023 0446   ALT 26 02/09/2023 0446   ALKPHOS 100 02/09/2023 0446   BILITOT 1.1 02/09/2023 0446   GFRNONAA >60 02/09/2023 0446     VAS [place] ABI WITH/WO TBI Result Date: 03/01/2023  LOWER EXTREMITY DOPPLER STUDY Patient Name:  TONG PIECZYNSKI  Date of Exam:   03/01/2023 Medical Rec #: [id number]         Accession #:    [id number] Date of Birth: 1928/09/07         Patient Gender: M Patient Age:   36 years Exam Location:  [facility] Procedure:      VAS [place] ABI WITH/WO TBI Referring Phys: Barbara Cower DEW --------------------------------------------------------------------------------  Indications: Peripheral artery disease.  Vascular Interventions: 01/30/23: Left SFA/popliteal stent x2 for pvd/aneurysm;. Performing Technologist: Jamse Mead RT, RDMS, RVT  Examination Guidelines: A complete evaluation includes at minimum, Doppler waveform signals and systolic blood pressure reading at the level of bilateral brachial, anterior tibial, and posterior tibial arteries, when vessel segments are accessible. Bilateral testing is considered an integral part of a complete examination. Photoelectric Plethysmograph (PPG) waveforms and toe systolic pressure  readings are included as required and additional duplex testing as needed. Limited examinations for reoccurring indications may be performed as noted.  ABI Findings: +---------+------------------+-----+---------+-------+ Right    Rt Pressure (mmHg)IndexWaveform Comment +---------+------------------+-----+---------+-------+ Brachial 144                                     +---------+------------------+-----+---------+-------+ PTA      166               1.15 triphasic        +---------+------------------+-----+---------+-------+ DP       165               1.15 triphasic        +---------+------------------+-----+---------+-------+ Great Toe103               0.72 Normal           +---------+------------------+-----+---------+-------+ +---------+------------------+-----+---------+-------+ Left     Lt Pressure (mmHg)IndexWaveform Comment +---------+------------------+-----+---------+-------+ Brachial 134                                     +---------+------------------+-----+---------+-------+ PTA      166               1.15 triphasic        +---------+------------------+-----+---------+-------+ DP  161               1.12 triphasic        +---------+------------------+-----+---------+-------+ Great Toe130               0.90 Normal           +---------+------------------+-----+---------+-------+ Large hematoma, with no internal flow, extending from the groin to mid thigh level noted.  Summary: Bilateral: Bilateral ankle-brachial indexes are within normal range. Bilateral toe-brachial indexes are within normal range. *See table(s) above for measurements and observations.  Electronically signed by Festus Barren MD on 03/01/2023 at 2:14:12 PM.    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  We will evaluate this with follow-up.  2. Popliteal aneurysm (HCC) Recommend:  The patient is  status post successful angiogram with intervention.     The patient denies lifestyle limiting changes at this point in time.  No further invasive studies, angiography or surgery at this time. The patient should continue walking and begin a more formal exercise program.  The patient should continue antiplatelet therapy and aggressive treatment of the lipid abnormalities  Continued surveillance is indicated as atherosclerosis is likely to progress with time.    Patient should undergo noninvasive studies as ordered. The patient will follow up with me to review the studies.   3. Essential hypertension Continue antihypertensive medications as already ordered, these medications have been reviewed and there are no changes at this time.    Current Outpatient Medications on File Prior to Visit  Medication Sig Dispense Refill   aspirin 81 MG EC tablet Take 81 mg by mouth daily.       atorvastatin (LIPITOR) 40 MG tablet Take 40 mg by mouth daily.       clopidogrel (PLAVIX) 75 MG tablet Take 1 tablet (75 mg total) by mouth daily. Restart on 02/13/23     diltiazem (CARDIZEM CD) 240 MG 24 hr capsule Take 240 mg by mouth daily.     pantoprazole (PROTONIX) 40 MG tablet Take 40 mg by mouth daily.     No current facility-administered medications on file prior to visit.    There are no Patient Instructions on file for this visit. No follow-ups on file.   Georgiana Spinner, NP

## 2023-06-07 LAB — VAS US ABI WITH/WO TBI
Left ABI: 1.09
Right ABI: 1.09

## 2023-07-11 ENCOUNTER — Encounter (INDEPENDENT_AMBULATORY_CARE_PROVIDER_SITE_OTHER): Payer: Self-pay

## 2023-11-23 ENCOUNTER — Other Ambulatory Visit (INDEPENDENT_AMBULATORY_CARE_PROVIDER_SITE_OTHER): Payer: Self-pay | Admitting: Vascular Surgery

## 2023-11-30 ENCOUNTER — Encounter (HOSPITAL_COMMUNITY): Payer: Self-pay | Admitting: Neurological Surgery

## 2023-11-30 ENCOUNTER — Emergency Department (HOSPITAL_COMMUNITY)

## 2023-11-30 ENCOUNTER — Inpatient Hospital Stay (HOSPITAL_COMMUNITY)

## 2023-11-30 ENCOUNTER — Inpatient Hospital Stay (HOSPITAL_COMMUNITY)
Admission: EM | Admit: 2023-11-30 | Discharge: 2023-12-05 | DRG: 087 | Disposition: A | Discharge status: Rehabilitation Facility | Attending: Internal Medicine | Admitting: Internal Medicine

## 2023-11-30 DIAGNOSIS — A4150 Gram-negative sepsis, unspecified: Secondary | ICD-10-CM | POA: Diagnosis not present

## 2023-11-30 DIAGNOSIS — I714 Abdominal aortic aneurysm, without rupture, unspecified: Secondary | ICD-10-CM | POA: Diagnosis present

## 2023-11-30 DIAGNOSIS — E785 Hyperlipidemia, unspecified: Secondary | ICD-10-CM

## 2023-11-30 DIAGNOSIS — R519 Headache, unspecified: Secondary | ICD-10-CM | POA: Diagnosis present

## 2023-11-30 DIAGNOSIS — T1490XA Injury, unspecified, initial encounter: Secondary | ICD-10-CM

## 2023-11-30 DIAGNOSIS — G479 Sleep disorder, unspecified: Secondary | ICD-10-CM | POA: Diagnosis not present

## 2023-11-30 DIAGNOSIS — I7300 Raynaud's syndrome without gangrene: Secondary | ICD-10-CM | POA: Diagnosis present

## 2023-11-30 DIAGNOSIS — S42034A Nondisplaced fracture of lateral end of right clavicle, initial encounter for closed fracture: Secondary | ICD-10-CM | POA: Diagnosis present

## 2023-11-30 DIAGNOSIS — A499 Bacterial infection, unspecified: Secondary | ICD-10-CM | POA: Diagnosis not present

## 2023-11-30 DIAGNOSIS — Z7902 Long term (current) use of antithrombotics/antiplatelets: Secondary | ICD-10-CM

## 2023-11-30 DIAGNOSIS — I10 Essential (primary) hypertension: Secondary | ICD-10-CM

## 2023-11-30 DIAGNOSIS — S42001D Fracture of unspecified part of right clavicle, subsequent encounter for fracture with routine healing: Secondary | ICD-10-CM | POA: Diagnosis present

## 2023-11-30 DIAGNOSIS — R066 Hiccough: Secondary | ICD-10-CM | POA: Diagnosis present

## 2023-11-30 DIAGNOSIS — R402252 Coma scale, best verbal response, oriented, at arrival to emergency department: Secondary | ICD-10-CM | POA: Diagnosis present

## 2023-11-30 DIAGNOSIS — Z8679 Personal history of other diseases of the circulatory system: Secondary | ICD-10-CM | POA: Diagnosis not present

## 2023-11-30 DIAGNOSIS — R10A3 Flank pain, bilateral: Secondary | ICD-10-CM | POA: Diagnosis not present

## 2023-11-30 DIAGNOSIS — R7989 Other specified abnormal findings of blood chemistry: Secondary | ICD-10-CM | POA: Diagnosis not present

## 2023-11-30 DIAGNOSIS — K5900 Constipation, unspecified: Secondary | ICD-10-CM | POA: Diagnosis not present

## 2023-11-30 DIAGNOSIS — Z9109 Other allergy status, other than to drugs and biological substances: Secondary | ICD-10-CM | POA: Diagnosis not present

## 2023-11-30 DIAGNOSIS — S065X0A Traumatic subdural hemorrhage without loss of consciousness, initial encounter: Principal | ICD-10-CM | POA: Diagnosis present

## 2023-11-30 DIAGNOSIS — E871 Hypo-osmolality and hyponatremia: Secondary | ICD-10-CM | POA: Diagnosis present

## 2023-11-30 DIAGNOSIS — D72829 Elevated white blood cell count, unspecified: Secondary | ICD-10-CM

## 2023-11-30 DIAGNOSIS — N179 Acute kidney failure, unspecified: Secondary | ICD-10-CM | POA: Diagnosis not present

## 2023-11-30 DIAGNOSIS — A419 Sepsis, unspecified organism: Secondary | ICD-10-CM | POA: Diagnosis not present

## 2023-11-30 DIAGNOSIS — Z87442 Personal history of urinary calculi: Secondary | ICD-10-CM | POA: Diagnosis not present

## 2023-11-30 DIAGNOSIS — S065XAA Traumatic subdural hemorrhage with loss of consciousness status unknown, initial encounter: Secondary | ICD-10-CM | POA: Diagnosis not present

## 2023-11-30 DIAGNOSIS — R402142 Coma scale, eyes open, spontaneous, at arrival to emergency department: Secondary | ICD-10-CM | POA: Diagnosis present

## 2023-11-30 DIAGNOSIS — R402362 Coma scale, best motor response, obeys commands, at arrival to emergency department: Secondary | ICD-10-CM | POA: Diagnosis present

## 2023-11-30 DIAGNOSIS — I5020 Unspecified systolic (congestive) heart failure: Secondary | ICD-10-CM | POA: Diagnosis not present

## 2023-11-30 DIAGNOSIS — S42024A Nondisplaced fracture of shaft of right clavicle, initial encounter for closed fracture: Secondary | ICD-10-CM

## 2023-11-30 DIAGNOSIS — Y92008 Other place in unspecified non-institutional (private) residence as the place of occurrence of the external cause: Secondary | ICD-10-CM | POA: Diagnosis not present

## 2023-11-30 DIAGNOSIS — Z860100 Personal history of colon polyps, unspecified: Secondary | ICD-10-CM | POA: Diagnosis not present

## 2023-11-30 DIAGNOSIS — M1612 Unilateral primary osteoarthritis, left hip: Secondary | ICD-10-CM | POA: Diagnosis present

## 2023-11-30 DIAGNOSIS — S065X9A Traumatic subdural hemorrhage with loss of consciousness of unspecified duration, initial encounter: Secondary | ICD-10-CM | POA: Diagnosis not present

## 2023-11-30 DIAGNOSIS — R5381 Other malaise: Secondary | ICD-10-CM | POA: Diagnosis not present

## 2023-11-30 DIAGNOSIS — Z8249 Family history of ischemic heart disease and other diseases of the circulatory system: Secondary | ICD-10-CM

## 2023-11-30 DIAGNOSIS — H3530 Unspecified macular degeneration: Secondary | ICD-10-CM | POA: Diagnosis present

## 2023-11-30 DIAGNOSIS — R7303 Prediabetes: Secondary | ICD-10-CM | POA: Diagnosis present

## 2023-11-30 DIAGNOSIS — Z23 Encounter for immunization: Secondary | ICD-10-CM

## 2023-11-30 DIAGNOSIS — N3000 Acute cystitis without hematuria: Secondary | ICD-10-CM | POA: Diagnosis not present

## 2023-11-30 DIAGNOSIS — I4891 Unspecified atrial fibrillation: Secondary | ICD-10-CM | POA: Diagnosis present

## 2023-11-30 DIAGNOSIS — S065X0D Traumatic subdural hemorrhage without loss of consciousness, subsequent encounter: Secondary | ICD-10-CM | POA: Diagnosis not present

## 2023-11-30 DIAGNOSIS — E440 Moderate protein-calorie malnutrition: Secondary | ICD-10-CM | POA: Diagnosis present

## 2023-11-30 DIAGNOSIS — Z87891 Personal history of nicotine dependence: Secondary | ICD-10-CM | POA: Diagnosis not present

## 2023-11-30 DIAGNOSIS — Z79899 Other long term (current) drug therapy: Secondary | ICD-10-CM | POA: Diagnosis not present

## 2023-11-30 DIAGNOSIS — Z888 Allergy status to other drugs, medicaments and biological substances status: Secondary | ICD-10-CM

## 2023-11-30 DIAGNOSIS — Z66 Do not resuscitate: Secondary | ICD-10-CM | POA: Diagnosis present

## 2023-11-30 DIAGNOSIS — S0181XD Laceration without foreign body of other part of head, subsequent encounter: Secondary | ICD-10-CM | POA: Diagnosis not present

## 2023-11-30 DIAGNOSIS — S065X9D Traumatic subdural hemorrhage with loss of consciousness of unspecified duration, subsequent encounter: Secondary | ICD-10-CM | POA: Diagnosis not present

## 2023-11-30 DIAGNOSIS — S0181XA Laceration without foreign body of other part of head, initial encounter: Secondary | ICD-10-CM | POA: Diagnosis present

## 2023-11-30 DIAGNOSIS — S065X9S Traumatic subdural hemorrhage with loss of consciousness of unspecified duration, sequela: Secondary | ICD-10-CM | POA: Diagnosis not present

## 2023-11-30 DIAGNOSIS — Z91041 Radiographic dye allergy status: Secondary | ICD-10-CM

## 2023-11-30 DIAGNOSIS — N390 Urinary tract infection, site not specified: Secondary | ICD-10-CM | POA: Diagnosis not present

## 2023-11-30 DIAGNOSIS — S0990XA Unspecified injury of head, initial encounter: Secondary | ICD-10-CM

## 2023-11-30 DIAGNOSIS — R509 Fever, unspecified: Secondary | ICD-10-CM | POA: Diagnosis not present

## 2023-11-30 DIAGNOSIS — R339 Retention of urine, unspecified: Secondary | ICD-10-CM | POA: Diagnosis present

## 2023-11-30 DIAGNOSIS — Z558 Other problems related to education and literacy: Secondary | ICD-10-CM | POA: Diagnosis not present

## 2023-11-30 DIAGNOSIS — M549 Dorsalgia, unspecified: Secondary | ICD-10-CM | POA: Diagnosis present

## 2023-11-30 DIAGNOSIS — R351 Nocturia: Secondary | ICD-10-CM | POA: Diagnosis not present

## 2023-11-30 DIAGNOSIS — G8929 Other chronic pain: Secondary | ICD-10-CM | POA: Diagnosis not present

## 2023-11-30 DIAGNOSIS — R4189 Other symptoms and signs involving cognitive functions and awareness: Secondary | ICD-10-CM | POA: Diagnosis not present

## 2023-11-30 DIAGNOSIS — M545 Low back pain, unspecified: Secondary | ICD-10-CM | POA: Diagnosis not present

## 2023-11-30 LAB — COMPREHENSIVE METABOLIC PANEL WITH GFR
ALT: 22 U/L (ref 0–44)
AST: 29 U/L (ref 15–41)
Albumin: 3.5 g/dL (ref 3.5–5.0)
Alkaline Phosphatase: 98 U/L (ref 38–126)
Anion gap: 15 (ref 5–15)
BUN: 15 mg/dL (ref 8–23)
CO2: 19 mmol/L — ABNORMAL LOW (ref 22–32)
Calcium: 8.7 mg/dL — ABNORMAL LOW (ref 8.9–10.3)
Chloride: 103 mmol/L (ref 98–111)
Creatinine, Ser: 1.06 mg/dL (ref 0.61–1.24)
GFR, Estimated: >60 mL/min (ref >60)
Glucose, Bld: 125 mg/dL — ABNORMAL HIGH (ref 70–99)
Potassium: 4.1 mmol/L (ref 3.5–5.1)
Sodium: 137 mmol/L (ref 135–145)
Total Bilirubin: 1.3 mg/dL — ABNORMAL HIGH (ref 0.0–1.2)
Total Protein: 6.1 g/dL — ABNORMAL LOW (ref 6.5–8.1)

## 2023-11-30 LAB — CBC WITH DIFFERENTIAL/PLATELET
Abs Immature Granulocytes: 0.14 K/uL — ABNORMAL HIGH (ref 0.00–0.07)
Basophils Absolute: 0.1 K/uL (ref 0.0–0.1)
Basophils Relative: 0 %
Eosinophils Absolute: 0.1 K/uL (ref 0.0–0.5)
Eosinophils Relative: 1 %
HCT: 46.5 % (ref 39.0–52.0)
Hemoglobin: 14.9 g/dL (ref 13.0–17.0)
Immature Granulocytes: 1 %
Lymphocytes Relative: 5 %
Lymphs Abs: 0.9 K/uL (ref 0.7–4.0)
MCH: 31.3 pg (ref 26.0–34.0)
MCHC: 32 g/dL (ref 30.0–36.0)
MCV: 97.7 fL (ref 80.0–100.0)
Monocytes Absolute: 1.2 K/uL — ABNORMAL HIGH (ref 0.1–1.0)
Monocytes Relative: 7 %
Neutro Abs: 14.7 K/uL — ABNORMAL HIGH (ref 1.7–7.7)
Neutrophils Relative %: 86 %
Platelets: 241 K/uL (ref 150–400)
RBC: 4.76 MIL/uL (ref 4.22–5.81)
RDW: 13.1 % (ref 11.5–15.5)
WBC: 17 K/uL — ABNORMAL HIGH (ref 4.0–10.5)
nRBC: 0 % (ref 0.0–0.2)

## 2023-11-30 LAB — PROTIME-INR
INR: 1 (ref 0.8–1.2)
Prothrombin Time: 14.3 s (ref 11.4–15.2)

## 2023-11-30 LAB — TYPE AND SCREEN
ABO/RH(D): A POS
Antibody Screen: NEGATIVE

## 2023-11-30 LAB — GLUCOSE, CAPILLARY: Glucose-Capillary: 114 mg/dL — ABNORMAL HIGH (ref 70–99)

## 2023-11-30 LAB — APTT: aPTT: 32 s (ref 24–36)

## 2023-11-30 LAB — MRSA NEXT GEN BY PCR, NASAL: MRSA by PCR Next Gen: NOT DETECTED

## 2023-11-30 MED ORDER — POLYETHYLENE GLYCOL 3350 17 G PO PACK
17.0000 g | PACK | Freq: Every day | ORAL | Status: DC | PRN
  Administered 2023-12-03: 17 g via ORAL
  Filled 2023-11-30: qty 1

## 2023-11-30 MED ORDER — DOCUSATE SODIUM 100 MG PO CAPS
100.0000 mg | ORAL_CAPSULE | Freq: Two times a day (BID) | ORAL | Status: DC | PRN
  Administered 2023-11-30 – 2023-12-03 (×3): 100 mg via ORAL
  Filled 2023-11-30 (×3): qty 1

## 2023-11-30 MED ORDER — CHLORHEXIDINE GLUCONATE CLOTH 2 % EX PADS
6.0000 | MEDICATED_PAD | Freq: Every day | CUTANEOUS | Status: DC
  Administered 2023-11-30 – 2023-12-03 (×4): 6 via TOPICAL

## 2023-11-30 MED ORDER — ATORVASTATIN CALCIUM 40 MG PO TABS
40.0000 mg | ORAL_TABLET | Freq: Every day | ORAL | Status: DC
Start: 2023-12-01 — End: 2023-12-05
  Administered 2023-12-02 – 2023-12-05 (×4): 40 mg via ORAL
  Filled 2023-11-30 (×5): qty 1

## 2023-11-30 MED ORDER — LEVETIRACETAM (KEPPRA) 500 MG/5 ML ADULT IV PUSH
750.0000 mg | Freq: Two times a day (BID) | INTRAVENOUS | Status: DC
Start: 2023-11-30 — End: 2023-11-30

## 2023-11-30 MED ORDER — LABETALOL HCL 5 MG/ML IV SOLN
10.0000 mg | INTRAVENOUS | Status: DC | PRN
Start: 2023-11-30 — End: 2023-12-05
  Filled 2023-11-30: qty 4

## 2023-11-30 MED ORDER — BUTALBITAL-APAP-CAFFEINE 50-325-40 MG PO TABS
1.0000 | ORAL_TABLET | ORAL | Status: DC | PRN
  Administered 2023-12-01: 1 via ORAL
  Filled 2023-11-30: qty 1

## 2023-11-30 MED ORDER — LACTATED RINGERS IV SOLN: INTRAVENOUS | Status: DC

## 2023-11-30 MED ORDER — MORPHINE SULFATE (PF) 4 MG/ML IV SOLN
4.0000 mg | Freq: Once | INTRAVENOUS | Status: AC
  Administered 2023-11-30: 4 mg via INTRAVENOUS
  Filled 2023-11-30: qty 1

## 2023-11-30 MED ORDER — ACETAMINOPHEN 325 MG PO TABS
650.0000 mg | ORAL_TABLET | Freq: Four times a day (QID) | ORAL | Status: DC | PRN
  Administered 2023-11-30 – 2023-12-02 (×3): 650 mg via ORAL
  Filled 2023-11-30 (×4): qty 2

## 2023-11-30 MED ORDER — LEVETIRACETAM (KEPPRA) 500 MG/5 ML ADULT IV PUSH
750.0000 mg | Freq: Two times a day (BID) | INTRAVENOUS | Status: DC
  Administered 2023-11-30 – 2023-12-01 (×2): 750 mg via INTRAVENOUS
  Filled 2023-11-30 (×2): qty 10

## 2023-11-30 MED ORDER — FENTANYL CITRATE (PF) 50 MCG/ML IJ SOSY
25.0000 ug | PREFILLED_SYRINGE | INTRAMUSCULAR | Status: DC | PRN
  Administered 2023-12-01 (×4): 25 ug via INTRAVENOUS
  Filled 2023-11-30 (×4): qty 1

## 2023-11-30 MED ORDER — TETANUS-DIPHTH-ACELL PERTUSSIS 5-2-15.5 LF-MCG/0.5 IM SUSP
0.5000 mL | Freq: Once | INTRAMUSCULAR | Status: AC
  Administered 2023-11-30: 0.5 mL via INTRAMUSCULAR
  Filled 2023-11-30: qty 0.5

## 2023-11-30 MED ORDER — DILTIAZEM HCL ER COATED BEADS 240 MG PO CP24
240.0000 mg | ORAL_CAPSULE | Freq: Every day | ORAL | Status: DC
  Administered 2023-12-02 – 2023-12-05 (×4): 240 mg via ORAL
  Filled 2023-11-30 (×5): qty 1

## 2023-11-30 MED ORDER — ONDANSETRON HCL 4 MG/2ML IJ SOLN
4.0000 mg | Freq: Once | INTRAMUSCULAR | Status: AC
  Administered 2023-11-30: 4 mg via INTRAVENOUS
  Filled 2023-11-30: qty 2
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
## 2023-11-30 NOTE — Progress Notes (Signed)
 Orthopedic Tech Progress Note Patient Details:  Jerry [name] [birth year] [id number]  Ortho Devices Type of Ortho Device: Arm sling Ortho Device/Splint Location: delivered to 45M, for RUE Ortho Device/Splint Interventions: Ordered      Tinnie Ronal Brasil 11/30/2023, 7:24 PM
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
## 2023-11-30 NOTE — ED Notes (Signed)
 PT log rolled while maintaining c-spine. Pt complains of mid thoracic pain no step offs noted. No abrasions or deformities to back  EDP performing FAST at bedside.

## 2023-11-30 NOTE — ED Provider Notes (Signed)
  Provider Note MRN:  [id number]  Arrival date & time: 11/30/23    ED Course and Medical Decision Making  Assumed care from Dr Pamella at shift change.  See note from prior team for complete details, in brief:  89 year old male on Eliquis for A-fib here following a fall. Does not believe he had LOC. Found to have subdural hematoma, 7 mm, Dr. Joshua evaluated patient No other significant traumatic injuries on imaging He is having a headache but otherwise pain is well-controlled Plan to send patient to ICU  CT without obvious fracture or deformities. Patient has no midline cervical spine tenderness on palpation or with full range of motion. Patient denies radiculopathy-like symptoms and is moving all four extremities freely without any weakness. Patient is not intoxicated and is without altered mental status. Patient was cleared from cervical collar.  Spoke w/ PCCM team      .Critical Care  Performed by: Elnor Jayson LABOR, DO Authorized by: Elnor Jayson LABOR, DO   Critical care provider statement:    Critical care time (minutes):  30   Critical care time was exclusive of:  Separately billable procedures and treating other patients   Critical care was necessary to treat or prevent imminent or life-threatening deterioration of the following conditions:  Trauma and CNS failure or compromise   Critical care was time spent personally by me on the following activities:  Development of treatment plan with patient or surrogate, discussions with consultants, evaluation of patient's response to treatment, examination of patient, ordering and review of laboratory studies, ordering and review of radiographic studies, ordering and performing treatments and interventions, pulse oximetry, re-evaluation of patient's condition, review of old charts and obtaining history from patient or surrogate   Care discussed with: admitting provider     Final Clinical Impressions(s) / ED Diagnoses     ICD-10-CM   1.  Subdural hematoma (HCC)  S06.5XAA     2. Trauma  T14.90XA     3. Injury of head, initial encounter  S09.90XA       ED Discharge Orders     None       Discharge Instructions   None        Elnor Jayson LABOR, DO 11/30/23 1653
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
## 2023-12-01 ENCOUNTER — Ambulatory Visit (INDEPENDENT_AMBULATORY_CARE_PROVIDER_SITE_OTHER): Admitting: Vascular Surgery

## 2023-12-01 ENCOUNTER — Encounter (INDEPENDENT_AMBULATORY_CARE_PROVIDER_SITE_OTHER)

## 2023-12-01 ENCOUNTER — Other Ambulatory Visit: Payer: Self-pay

## 2023-12-01 ENCOUNTER — Other Ambulatory Visit (INDEPENDENT_AMBULATORY_CARE_PROVIDER_SITE_OTHER)

## 2023-12-01 LAB — CBC
HCT: 38 % — ABNORMAL LOW (ref 39.0–52.0)
Hemoglobin: 12.5 g/dL — ABNORMAL LOW (ref 13.0–17.0)
MCH: 30.9 pg (ref 26.0–34.0)
MCHC: 32.9 g/dL (ref 30.0–36.0)
MCV: 93.8 fL (ref 80.0–100.0)
Platelets: 215 K/uL (ref 150–400)
RBC: 4.05 MIL/uL — ABNORMAL LOW (ref 4.22–5.81)
RDW: 13.2 % (ref 11.5–15.5)
WBC: 11 K/uL — ABNORMAL HIGH (ref 4.0–10.5)
nRBC: 0 % (ref 0.0–0.2)

## 2023-12-01 LAB — BASIC METABOLIC PANEL WITH GFR
Anion gap: 12 (ref 5–15)
BUN: 15 mg/dL (ref 8–23)
CO2: 19 mmol/L — ABNORMAL LOW (ref 22–32)
Calcium: 8.5 mg/dL — ABNORMAL LOW (ref 8.9–10.3)
Chloride: 104 mmol/L (ref 98–111)
Creatinine, Ser: 0.96 mg/dL (ref 0.61–1.24)
GFR, Estimated: >60 mL/min (ref >60)
Glucose, Bld: 107 mg/dL — ABNORMAL HIGH (ref 70–99)
Potassium: 4.1 mmol/L (ref 3.5–5.1)
Sodium: 135 mmol/L (ref 135–145)

## 2023-12-01 LAB — PHOSPHORUS: Phosphorus: 3.8 mg/dL (ref 2.5–4.6)

## 2023-12-01 LAB — MAGNESIUM: Magnesium: 1.9 mg/dL (ref 1.7–2.4)

## 2023-12-01 MED ORDER — LEVETIRACETAM 750 MG PO TABS: 750.0000 mg | ORAL_TABLET | Freq: Two times a day (BID) | ORAL | Status: DC

## 2023-12-01 MED ORDER — LEVETIRACETAM (KEPPRA) 500 MG/5 ML ADULT IV PUSH
750.0000 mg | Freq: Two times a day (BID) | INTRAVENOUS | Status: DC
  Administered 2023-12-01 – 2023-12-04 (×6): 750 mg via INTRAVENOUS
  Filled 2023-12-01 (×6): qty 10
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
## 2023-12-01 NOTE — TOC CAGE-AID Note (Signed)
 Transition of Care [facility]) - CAGE-AID Screening   Patient Details  Name: Jerry [name] MRN: [id number] Date of Birth: [birth year]  Transition of [facility]) CM/SW Contact:    Simone Tuckey E Yassine Brunsman, LCSW Phone Number: 12/01/2023, 8:57 AM   Clinical Narrative: Not fully oriented.   CAGE-AID Screening: Substance Abuse Screening unable to be completed due to: : Patient unable to participate

## 2023-12-01 NOTE — TOC CM/SW Note (Signed)
 Transition of Care [facility]) - Inpatient Brief Assessment   Patient Details  Name: VIGNESH WILLERT MRN: [id number] Date of Birth: 03/20/1928  Transition of Care [facility]) CM/SW Contact:    Lauraine FORBES Saa, LCSWA Phone Number: 12/01/2023, 8:31 AM   Clinical Narrative:  8:31 AM Per chart review, patient resides at home. Patient has a PCP and insurance. Patient does not have SNF history. Patient has HH history with [facility]. Patient has DME (RW) history with [facility]. Patient's preferred pharmacy is [facility]. Patient is not currently fully oriented. No TOC needs identified at this time. TOC will continue to follow.  Transition of Care Asessment: Insurance and Status: Insurance coverage has been reviewed Patient has primary care physician: Yes Home environment has been reviewed: Private Residence Prior level of function:: N/A Prior/Current Home Services: No current home services Social Drivers of Health Review:  (Patient unable to answer) Readmission risk has been reviewed: Yes (Currently Green 12%) Transition of care needs: no transition of care needs at this time

## 2023-12-01 NOTE — Progress Notes (Signed)
 Patient arrived to the floor. Patient is alert and oriented x4. Patient's family also arrived at bedside with belongings. Wife brought patient's home medications, this RN explained personal belongings protocol. Family verbalized understanding and agreed to send the medications home. Patient is settled in room with bed alarm on, bed in lowest position, and call bell near by.    12/01/23 1610  Vitals  Temp 98.8 F (37.1 C)  Temp Source Oral  BP (!) 147/69  MAP (mmHg) 91  BP Location Right Arm  BP Method Automatic  Patient Position (if appropriate) Lying  Pulse Rate 89  Pulse Rate Source Monitor  Resp 14  Level of Consciousness  Level of Consciousness Alert  MEWS COLOR  MEWS Score Color Green  Oxygen Therapy  SpO2 93 %  O2 Device Room Air  MEWS Score  MEWS Temp 0  MEWS Systolic 0  MEWS Pulse 0  MEWS RR 0  MEWS LOC 0  MEWS Score 0
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
## 2023-12-02 DIAGNOSIS — S065XAA Traumatic subdural hemorrhage with loss of consciousness status unknown, initial encounter: Secondary | ICD-10-CM | POA: Diagnosis not present

## 2023-12-02 MED ORDER — COLLAGENASE 250 UNIT/GM EX OINT
TOPICAL_OINTMENT | Freq: Every day | CUTANEOUS | Status: DC
  Filled 2023-12-02 (×2): qty 30

## 2023-12-02 MED ORDER — ENSURE PLUS HIGH PROTEIN PO LIQD
237.0000 mL | Freq: Two times a day (BID) | ORAL | Status: DC
  Administered 2023-12-02 – 2023-12-05 (×6): 237 mL via ORAL

## 2023-12-02 MED ORDER — METHOCARBAMOL 500 MG PO TABS
500.0000 mg | ORAL_TABLET | Freq: Three times a day (TID) | ORAL | Status: DC | PRN
Start: 2023-12-02 — End: 2023-12-05
  Administered 2023-12-02 – 2023-12-05 (×7): 500 mg via ORAL
  Filled 2023-12-02 (×7): qty 1

## 2023-12-02 MED ORDER — SIMETHICONE 80 MG PO CHEW
80.0000 mg | CHEWABLE_TABLET | Freq: Four times a day (QID) | ORAL | Status: DC | PRN
Start: 2023-12-02 — End: 2023-12-05
  Administered 2023-12-02 – 2023-12-03 (×2): 80 mg via ORAL
  Filled 2023-12-02 (×2): qty 1

## 2023-12-02 MED ORDER — MEDIHONEY WOUND/BURN DRESSING EX PSTE
1.0000 | PASTE | Freq: Every day | CUTANEOUS | Status: DC
  Filled 2023-12-02: qty 44

## 2023-12-02 MED ORDER — OXYCODONE HCL 5 MG PO TABS
5.0000 mg | ORAL_TABLET | ORAL | Status: DC | PRN
  Administered 2023-12-02 – 2023-12-04 (×7): 5 mg via ORAL
  Filled 2023-12-02 (×7): qty 1

## 2023-12-02 MED ORDER — ACETAMINOPHEN 325 MG PO TABS
650.0000 mg | ORAL_TABLET | Freq: Four times a day (QID) | ORAL | Status: DC | PRN
Start: 2023-12-02 — End: 2023-12-05
  Administered 2023-12-02 – 2023-12-03 (×5): 650 mg via ORAL
  Filled 2023-12-02 (×4): qty 2

## 2023-12-02 NOTE — Progress Notes (Signed)
 Would care provided to the right hand, left hand, R-forehead.

## 2023-12-02 NOTE — Progress Notes (Signed)
 Ortho tech provided splint for the first digit left hand.
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
## 2023-12-02 NOTE — Progress Notes (Signed)
 Patient assisted with daily cares, bath, BM, mouth care.   Pain addressed and offered breakfast. Family at bedside.   Patient assisted with repositioning into the chair. Moved with +1 assistance.   Tolerated fairly.
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
## 2023-12-02 NOTE — Plan of Care (Signed)
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
## 2023-12-03 ENCOUNTER — Inpatient Hospital Stay (HOSPITAL_COMMUNITY)

## 2023-12-03 DIAGNOSIS — S065XAA Traumatic subdural hemorrhage with loss of consciousness status unknown, initial encounter: Secondary | ICD-10-CM | POA: Diagnosis not present

## 2023-12-03 LAB — COMPREHENSIVE METABOLIC PANEL WITH GFR
ALT: 15 U/L (ref 0–44)
AST: 23 U/L (ref 15–41)
Albumin: 2.8 g/dL — ABNORMAL LOW (ref 3.5–5.0)
Alkaline Phosphatase: 98 U/L (ref 38–126)
Anion gap: 11 (ref 5–15)
BUN: 15 mg/dL (ref 8–23)
CO2: 23 mmol/L (ref 22–32)
Calcium: 8.8 mg/dL — ABNORMAL LOW (ref 8.9–10.3)
Chloride: 99 mmol/L (ref 98–111)
Creatinine, Ser: 0.81 mg/dL (ref 0.61–1.24)
GFR, Estimated: >60 mL/min (ref >60)
Glucose, Bld: 138 mg/dL — ABNORMAL HIGH (ref 70–99)
Potassium: 4.2 mmol/L (ref 3.5–5.1)
Sodium: 133 mmol/L — ABNORMAL LOW (ref 135–145)
Total Bilirubin: 1.1 mg/dL (ref 0.0–1.2)
Total Protein: 5.5 g/dL — ABNORMAL LOW (ref 6.5–8.1)

## 2023-12-03 LAB — MAGNESIUM: Magnesium: 2 mg/dL (ref 1.7–2.4)

## 2023-12-03 LAB — CBC
HCT: 39 % (ref 39.0–52.0)
Hemoglobin: 13 g/dL (ref 13.0–17.0)
MCH: 31.3 pg (ref 26.0–34.0)
MCHC: 33.3 g/dL (ref 30.0–36.0)
MCV: 93.8 fL (ref 80.0–100.0)
Platelets: 241 K/uL (ref 150–400)
RBC: 4.16 MIL/uL — ABNORMAL LOW (ref 4.22–5.81)
RDW: 13.1 % (ref 11.5–15.5)
WBC: 13.3 K/uL — ABNORMAL HIGH (ref 4.0–10.5)
nRBC: 0 % (ref 0.0–0.2)

## 2023-12-03 MED ORDER — POLYETHYLENE GLYCOL 3350 17 G PO PACK
17.0000 g | PACK | Freq: Two times a day (BID) | ORAL | Status: DC
  Administered 2023-12-04 – 2023-12-05 (×3): 17 g via ORAL
  Filled 2023-12-03 (×5): qty 1

## 2023-12-03 MED ORDER — SMOG ENEMA
960.0000 mL | Freq: Once | RECTAL | Status: AC
  Administered 2023-12-03: 960 mL via RECTAL
  Filled 2023-12-03: qty 960

## 2023-12-03 MED ORDER — SENNOSIDES-DOCUSATE SODIUM 8.6-50 MG PO TABS
2.0000 | ORAL_TABLET | Freq: Two times a day (BID) | ORAL | Status: DC
  Administered 2023-12-03 – 2023-12-05 (×4): 2 via ORAL
  Filled 2023-12-03 (×5): qty 2

## 2023-12-03 MED ORDER — CHLORPROMAZINE HCL 10 MG PO TABS
10.0000 mg | ORAL_TABLET | Freq: Four times a day (QID) | ORAL | Status: DC | PRN
  Administered 2023-12-03 – 2023-12-05 (×3): 10 mg via ORAL
  Filled 2023-12-03 (×5): qty 1
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
## 2023-12-03 NOTE — Plan of Care (Signed)

## 2023-12-03 NOTE — [place] FL2 (Signed)
 [place]  MEDICAID FL2 LEVEL OF CARE FORM     IDENTIFICATION  Patient Name: Jerry [name] Birthdate: 10-May-1928 Sex: male Admission Date (Current Location): 11/30/2023  [facility] [place] and [place] Number:  Producer, television/film/video and Address:  [facility], [address], [place], [place] [address]      Provider Number: [id number]  Attending Physician Name and Address:  Trixie Nilda HERO, MD  Relative Name and Phone Number:       Current Level of Care: Hospital Recommended Level of Care: Skilled Nursing Facility Prior Approval Number:    Date Approved/Denied:   PASRR Number: [id number]  Discharge Plan: SNF    Current Diagnoses: Patient Active Problem List   Diagnosis Date Noted   Subdural hematoma (HCC) 11/30/2023   Hematoma 02/08/2023   Thigh hematoma, left, initial encounter 02/08/2023   Acute blood loss anemia 02/08/2023   Encounter for fitting and adjustment of hearing aid 09/25/2020   Nonexudative age-related macular degeneration, bilateral, intermediate dry stage 09/25/2020   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   AAA (abdominal aortic aneurysm) without rupture 09/18/2018   Bilateral sensorineural hearing loss 09/18/2018   Prediabetes 09/18/2018   Raynaud's disease 09/18/2018   Healthcare maintenance 05/18/2016   History of Paget's disease of bone 02/05/2016   Essential hypertension 05/12/2014   Popliteal aneurysm 08/03/2012   Hx of smoking 08/01/2012   Renal stones 08/01/2012   Hyperlipidemia 09/03/2009   PREMATURE VENTRICULAR CONTRACTIONS 09/03/2009    Orientation RESPIRATION BLADDER Height & Weight     Self, Time, Situation, Place  Normal Continent Weight: 152 lb 8.9 oz (69.2 kg) Height:  5' 5 (165.1 cm)  BEHAVIORAL SYMPTOMS/MOOD NEUROLOGICAL BOWEL NUTRITION STATUS      Continent    AMBULATORY STATUS COMMUNICATION OF NEEDS Skin   Limited Assist Verbally Normal                       Personal Care Assistance Level of  Assistance  Bathing, Feeding, Dressing Bathing Assistance: Limited assistance Feeding assistance: Limited assistance Dressing Assistance: Limited assistance     Functional Limitations Info  Sight, Speech, Hearing Sight Info: Impaired Hearing Info: Adequate Speech Info: Adequate    SPECIAL CARE FACTORS FREQUENCY  PT (By licensed PT), OT (By licensed OT)                    Contractures Contractures Info: Not present    Additional Factors Info  Code Status Code Status Info: DNR             Current Medications (12/03/2023):  This is the current hospital active medication list Current Facility-Administered Medications  Medication Dose Route Frequency Provider Last Rate Last Admin   acetaminophen  (TYLENOL ) tablet 650 mg  650 mg Oral Q6H PRN Gherghe, Costin M, MD   650 mg at 12/03/23 0549   atorvastatin  (LIPITOR) tablet 40 mg  40 mg Oral Daily Hoffman, Paul W, NP   40 mg at 12/03/23 9093   Chlorhexidine Gluconate Cloth 2 % PADS 6 each  6 each Topical Daily Rosan Deward ORN, NP   6 each at 12/03/23 9056   chlorproMAZINE  (THORAZINE ) tablet 10 mg  10 mg Oral QID PRN Gherghe, Costin M, MD       collagenase (SANTYL) ointment   Topical Daily Gherghe, Costin M, MD   Given at 12/03/23 0913   diltiazem (CARDIZEM CD) 24 hr capsule 240 mg  240 mg Oral Daily Rosan Deward ORN, NP  240 mg at 12/03/23 0906   feeding supplement (ENSURE PLUS HIGH PROTEIN) liquid 237 mL  237 mL Oral BID BM Gherghe, Costin M, MD   237 mL at 12/03/23 0943   labetalol  (NORMODYNE ) injection 10 mg  10 mg Intravenous Q10 min PRN Rosan Deward ORN, NP       levETIRAcetam (KEPPRA) undiluted injection 750 mg  750 mg Intravenous Q12H Mitchell, Madeline I, RPH   750 mg at 12/03/23 0900   methocarbamol (ROBAXIN) tablet 500 mg  500 mg Oral Q8H PRN Gherghe, Costin M, MD   500 mg at 12/03/23 0243   oxyCODONE  (Oxy IR/ROXICODONE ) immediate release tablet 5 mg  5 mg Oral Q4H PRN Gherghe, Costin M, MD   5 mg at 12/03/23 0243    polyethylene glycol (MIRALAX / GLYCOLAX) packet 17 g  17 g Oral BID Gherghe, Costin M, MD       senna-docusate (Senokot-S) tablet 2 tablet  2 tablet Oral BID Gherghe, Costin M, MD   2 tablet at 12/03/23 0906   simethicone (MYLICON) chewable tablet 80 mg  80 mg Oral Q6H PRN Gherghe, Costin M, MD   80 mg at 12/02/23 2059     Discharge Medications: Please see discharge summary for a list of discharge medications.  Relevant Imaging Results:  Relevant Lab Results:   Additional Information SS# [id number]  Jerry [name] [place], [place]

## 2023-12-04 DIAGNOSIS — S065XAA Traumatic subdural hemorrhage with loss of consciousness status unknown, initial encounter: Secondary | ICD-10-CM | POA: Diagnosis not present

## 2023-12-04 LAB — URINALYSIS, ROUTINE W REFLEX MICROSCOPIC
Bilirubin Urine: NEGATIVE
Glucose, UA: NEGATIVE mg/dL
Hgb urine dipstick: NEGATIVE
Ketones, ur: 5 mg/dL — AB
Nitrite: NEGATIVE
Protein, ur: NEGATIVE mg/dL
Specific Gravity, Urine: 1.031 — ABNORMAL HIGH (ref 1.005–1.030)
pH: 5 (ref 5.0–8.0)

## 2023-12-04 LAB — CBC
HCT: 36.6 % — ABNORMAL LOW (ref 39.0–52.0)
Hemoglobin: 12.1 g/dL — ABNORMAL LOW (ref 13.0–17.0)
MCH: 30.9 pg (ref 26.0–34.0)
MCHC: 33.1 g/dL (ref 30.0–36.0)
MCV: 93.4 fL (ref 80.0–100.0)
Platelets: 232 K/uL (ref 150–400)
RBC: 3.92 MIL/uL — ABNORMAL LOW (ref 4.22–5.81)
RDW: 13.1 % (ref 11.5–15.5)
WBC: 12.5 K/uL — ABNORMAL HIGH (ref 4.0–10.5)
nRBC: 0 % (ref 0.0–0.2)

## 2023-12-04 MED ORDER — LEVETIRACETAM 750 MG PO TABS
750.0000 mg | ORAL_TABLET | Freq: Two times a day (BID) | ORAL | Status: DC
  Administered 2023-12-04 – 2023-12-05 (×2): 750 mg via ORAL
  Filled 2023-12-04 (×2): qty 1

## 2023-12-04 NOTE — Care Management Important Message (Signed)
 Important Message  Patient Details  Name: Jerry [name] MRN: [id number] Date of Birth: Jan 07, 1929   Important Message Given:  Yes - Medicare IM     Jon Cruel 12/04/2023, 4:40 PM
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
## 2023-12-04 NOTE — Progress Notes (Signed)
 Inpatient Rehab Coordinator Note:  I met with patient wife and daughter at bedside to discuss CIR recommendations and goals/expectations of CIR stay.  We reviewed 3 hrs/day of therapy, physician follow up, and average length of stay 2 weeks (dependent upon progress) with goals of mod I. Family is interested in pursing CIR admission. Will continue to follow for medical readiness.   Rehab Admissons Coordinator Finlay [place], [place], [place] [phone]

## 2023-12-04 NOTE — Plan of Care (Signed)
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
## 2023-12-05 ENCOUNTER — Other Ambulatory Visit: Payer: Self-pay

## 2023-12-05 ENCOUNTER — Encounter (HOSPITAL_COMMUNITY): Payer: Self-pay | Admitting: Physical Medicine and Rehabilitation

## 2023-12-05 ENCOUNTER — Inpatient Hospital Stay (HOSPITAL_COMMUNITY)
Admission: AD | Admit: 2023-12-05 | Discharge: 2023-12-19 | DRG: 561 | Disposition: A | Discharge status: Home / Self Care | Source: Intra-hospital | Attending: Physical Medicine and Rehabilitation | Admitting: Physical Medicine and Rehabilitation

## 2023-12-05 DIAGNOSIS — Z8249 Family history of ischemic heart disease and other diseases of the circulatory system: Secondary | ICD-10-CM | POA: Diagnosis not present

## 2023-12-05 DIAGNOSIS — E785 Hyperlipidemia, unspecified: Secondary | ICD-10-CM | POA: Diagnosis present

## 2023-12-05 DIAGNOSIS — Z91041 Radiographic dye allergy status: Secondary | ICD-10-CM | POA: Diagnosis not present

## 2023-12-05 DIAGNOSIS — Z79899 Other long term (current) drug therapy: Secondary | ICD-10-CM | POA: Diagnosis not present

## 2023-12-05 DIAGNOSIS — G479 Sleep disorder, unspecified: Secondary | ICD-10-CM | POA: Diagnosis not present

## 2023-12-05 DIAGNOSIS — R3915 Urgency of urination: Secondary | ICD-10-CM | POA: Diagnosis not present

## 2023-12-05 DIAGNOSIS — E440 Moderate protein-calorie malnutrition: Secondary | ICD-10-CM | POA: Diagnosis present

## 2023-12-05 DIAGNOSIS — S0181XD Laceration without foreign body of other part of head, subsequent encounter: Secondary | ICD-10-CM | POA: Diagnosis not present

## 2023-12-05 DIAGNOSIS — E871 Hypo-osmolality and hyponatremia: Secondary | ICD-10-CM | POA: Diagnosis present

## 2023-12-05 DIAGNOSIS — S065XAA Traumatic subdural hemorrhage with loss of consciousness status unknown, initial encounter: Principal | ICD-10-CM | POA: Diagnosis present

## 2023-12-05 DIAGNOSIS — Z8679 Personal history of other diseases of the circulatory system: Secondary | ICD-10-CM | POA: Diagnosis not present

## 2023-12-05 DIAGNOSIS — N400 Enlarged prostate without lower urinary tract symptoms: Secondary | ICD-10-CM | POA: Diagnosis present

## 2023-12-05 DIAGNOSIS — Z87891 Personal history of nicotine dependence: Secondary | ICD-10-CM | POA: Diagnosis not present

## 2023-12-05 DIAGNOSIS — A499 Bacterial infection, unspecified: Secondary | ICD-10-CM | POA: Diagnosis not present

## 2023-12-05 DIAGNOSIS — S42001D Fracture of unspecified part of right clavicle, subsequent encounter for fracture with routine healing: Secondary | ICD-10-CM | POA: Diagnosis present

## 2023-12-05 DIAGNOSIS — I7300 Raynaud's syndrome without gangrene: Secondary | ICD-10-CM | POA: Diagnosis present

## 2023-12-05 DIAGNOSIS — M545 Low back pain, unspecified: Secondary | ICD-10-CM | POA: Diagnosis not present

## 2023-12-05 DIAGNOSIS — M1612 Unilateral primary osteoarthritis, left hip: Secondary | ICD-10-CM | POA: Diagnosis present

## 2023-12-05 DIAGNOSIS — S065X0D Traumatic subdural hemorrhage without loss of consciousness, subsequent encounter: Secondary | ICD-10-CM | POA: Diagnosis not present

## 2023-12-05 DIAGNOSIS — B9620 Unspecified Escherichia coli [E. coli] as the cause of diseases classified elsewhere: Secondary | ICD-10-CM | POA: Diagnosis not present

## 2023-12-05 DIAGNOSIS — Y92008 Other place in unspecified non-institutional (private) residence as the place of occurrence of the external cause: Secondary | ICD-10-CM

## 2023-12-05 DIAGNOSIS — Z7902 Long term (current) use of antithrombotics/antiplatelets: Secondary | ICD-10-CM

## 2023-12-05 DIAGNOSIS — I10 Essential (primary) hypertension: Secondary | ICD-10-CM | POA: Diagnosis present

## 2023-12-05 DIAGNOSIS — Z87442 Personal history of urinary calculi: Secondary | ICD-10-CM

## 2023-12-05 DIAGNOSIS — Z6823 Body mass index (BMI) 23.0-23.9, adult: Secondary | ICD-10-CM

## 2023-12-05 DIAGNOSIS — Z860100 Personal history of colon polyps, unspecified: Secondary | ICD-10-CM

## 2023-12-05 DIAGNOSIS — N390 Urinary tract infection, site not specified: Secondary | ICD-10-CM | POA: Diagnosis not present

## 2023-12-05 DIAGNOSIS — Z888 Allergy status to other drugs, medicaments and biological substances status: Secondary | ICD-10-CM

## 2023-12-05 DIAGNOSIS — S065X9S Traumatic subdural hemorrhage with loss of consciousness of unspecified duration, sequela: Secondary | ICD-10-CM | POA: Diagnosis not present

## 2023-12-05 DIAGNOSIS — K5900 Constipation, unspecified: Secondary | ICD-10-CM | POA: Diagnosis present

## 2023-12-05 DIAGNOSIS — Z9109 Other allergy status, other than to drugs and biological substances: Secondary | ICD-10-CM | POA: Diagnosis not present

## 2023-12-05 DIAGNOSIS — R10A3 Flank pain, bilateral: Secondary | ICD-10-CM | POA: Diagnosis not present

## 2023-12-05 DIAGNOSIS — H3530 Unspecified macular degeneration: Secondary | ICD-10-CM | POA: Diagnosis present

## 2023-12-05 DIAGNOSIS — S065X9D Traumatic subdural hemorrhage with loss of consciousness of unspecified duration, subsequent encounter: Secondary | ICD-10-CM | POA: Diagnosis not present

## 2023-12-05 DIAGNOSIS — R32 Unspecified urinary incontinence: Secondary | ICD-10-CM | POA: Diagnosis present

## 2023-12-05 DIAGNOSIS — S065X0A Traumatic subdural hemorrhage without loss of consciousness, initial encounter: Secondary | ICD-10-CM

## 2023-12-05 DIAGNOSIS — S065X9A Traumatic subdural hemorrhage with loss of consciousness of unspecified duration, initial encounter: Secondary | ICD-10-CM

## 2023-12-05 MED ORDER — OXYCODONE HCL 5 MG PO TABS: 5.0000 mg | ORAL_TABLET | ORAL | Status: DC | PRN

## 2023-12-05 MED ORDER — SENNOSIDES-DOCUSATE SODIUM 8.6-50 MG PO TABS
2.0000 | ORAL_TABLET | Freq: Two times a day (BID) | ORAL | Status: DC
  Administered 2023-12-05 – 2023-12-08 (×6): 2 via ORAL
  Filled 2023-12-05 (×6): qty 2

## 2023-12-05 MED ORDER — ATORVASTATIN CALCIUM 40 MG PO TABS
40.0000 mg | ORAL_TABLET | Freq: Every day | ORAL | Status: DC
  Administered 2023-12-06 – 2023-12-19 (×14): 40 mg via ORAL
  Filled 2023-12-05 (×14): qty 1

## 2023-12-05 MED ORDER — LEVETIRACETAM 750 MG PO TABS
750.0000 mg | ORAL_TABLET | Freq: Two times a day (BID) | ORAL | Status: AC
  Administered 2023-12-05 – 2023-12-07 (×4): 750 mg via ORAL
  Filled 2023-12-05 (×4): qty 1

## 2023-12-05 MED ORDER — POLYETHYLENE GLYCOL 3350 17 G PO PACK
17.0000 g | PACK | Freq: Two times a day (BID) | ORAL | Status: DC
  Administered 2023-12-05 – 2023-12-12 (×8): 17 g via ORAL
  Filled 2023-12-05 (×12): qty 1

## 2023-12-05 MED ORDER — CHLORPROMAZINE HCL 10 MG PO TABS: 10.0000 mg | ORAL_TABLET | Freq: Four times a day (QID) | ORAL | Status: DC | PRN

## 2023-12-05 MED ORDER — ENSURE PLUS HIGH PROTEIN PO LIQD
237.0000 mL | Freq: Two times a day (BID) | ORAL | Status: DC
  Administered 2023-12-06 – 2023-12-18 (×22): 237 mL via ORAL

## 2023-12-05 MED ORDER — METHOCARBAMOL 500 MG PO TABS: 500.0000 mg | ORAL_TABLET | Freq: Three times a day (TID) | ORAL | Status: DC | PRN

## 2023-12-05 MED ORDER — COLLAGENASE 250 UNIT/GM EX OINT
TOPICAL_OINTMENT | Freq: Every day | CUTANEOUS | Status: DC
  Filled 2023-12-05: qty 30

## 2023-12-05 MED ORDER — OXYCODONE HCL 5 MG PO TABS
5.0000 mg | ORAL_TABLET | ORAL | Status: DC | PRN
  Administered 2023-12-06 – 2023-12-16 (×6): 5 mg via ORAL
  Filled 2023-12-05 (×7): qty 1

## 2023-12-05 MED ORDER — DILTIAZEM HCL ER COATED BEADS 240 MG PO CP24
240.0000 mg | ORAL_CAPSULE | Freq: Every day | ORAL | Status: DC
  Administered 2023-12-06 – 2023-12-19 (×14): 240 mg via ORAL
  Filled 2023-12-05 (×14): qty 1

## 2023-12-05 MED ORDER — SIMETHICONE 80 MG PO CHEW: 80.0000 mg | CHEWABLE_TABLET | Freq: Four times a day (QID) | ORAL | Status: DC | PRN

## 2023-12-05 MED ORDER — LEVETIRACETAM 750 MG PO TABS: 750.0000 mg | ORAL_TABLET | Freq: Two times a day (BID) | ORAL | Status: DC

## 2023-12-05 MED ORDER — ACETAMINOPHEN 325 MG PO TABS
650.0000 mg | ORAL_TABLET | Freq: Four times a day (QID) | ORAL | Status: DC | PRN
  Administered 2023-12-06 – 2023-12-10 (×6): 650 mg via ORAL
  Filled 2023-12-05 (×6): qty 2

## 2023-12-05 NOTE — Progress Notes (Signed)
   Inpatient Rehabilitation Admissions Coordinator   I met with patient, wife and daughter. We have a CIR bed that we can admit him to today. They are in agreement. Acute team and TOC made aware. I will make the arrangements.  Heron Leavell, RN, MSN Rehab Admissions Coordinator [phone] 12/05/2023 11:25 AM
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
## 2023-12-05 NOTE — TOC Transition Note (Signed)
 Transition of Care (TOC) - Discharge Note Rayfield Gobble RN, BSN Inpatient Care Management Unit 4E- RN Case Manager See Treatment Team for direct phone #   Patient Details  Name: Jerry [name] MRN: [id number] Date of Birth: Jan 25, 1929  Transition of Care [facility]) CM/SW Contact:  Gobble Rayfield Hurst, RN Phone Number: 12/05/2023, 12:40 PM   Clinical Narrative:    Notified by Davene CALKINS rehab liaison that pt has bed available for rehab admit today.   Pt stable for transition to [facility] today once bed has been assigned unit staff will transport pt to [facility] rehab unit.    Final next level of care: IP Rehab Facility Barriers to Discharge: Barriers Resolved   Patient Goals and CMS Choice Patient states their goals for this hospitalization and ongoing recovery are:: rehab then return home   Choice offered to / list presented to : Patient, Spouse      Discharge Placement               [facility]        Discharge Plan and Services Additional resources added to the After Visit Summary for     Discharge Planning Services: CM Consult Post Acute Care Choice: IP Rehab          DME Arranged: N/A DME Agency: NA       HH Arranged: NA HH Agency: NA        Social Drivers of Health (SDOH) Interventions SDOH Screenings   Food Insecurity: No Food Insecurity (12/01/2023)  Housing: Low Risk  (12/01/2023)  Transportation Needs: No Transportation Needs (12/01/2023)  Utilities: Not At Risk (12/01/2023)  Financial Resource Strain: Low Risk  (04/07/2023)   Received from [facility]  Social Connections: Socially Integrated (12/01/2023)  Tobacco Use: Medium Risk (11/30/2023)     Readmission Risk Interventions    12/05/2023   12:40 PM  Readmission Risk Prevention Plan  Post Dischage Appt Complete  Medication Screening Complete  Transportation Screening Complete

## 2023-12-06 DIAGNOSIS — S065X9S Traumatic subdural hemorrhage with loss of consciousness of unspecified duration, sequela: Secondary | ICD-10-CM | POA: Diagnosis not present

## 2023-12-06 LAB — CBC WITH DIFFERENTIAL/PLATELET
Abs Immature Granulocytes: 0.06 K/uL (ref 0.00–0.07)
Basophils Absolute: 0.1 K/uL (ref 0.0–0.1)
Basophils Relative: 1 %
Eosinophils Absolute: 0.3 K/uL (ref 0.0–0.5)
Eosinophils Relative: 3 %
HCT: 34 % — ABNORMAL LOW (ref 39.0–52.0)
Hemoglobin: 11.5 g/dL — ABNORMAL LOW (ref 13.0–17.0)
Immature Granulocytes: 1 %
Lymphocytes Relative: 10 %
Lymphs Abs: 1 K/uL (ref 0.7–4.0)
MCH: 31.5 pg (ref 26.0–34.0)
MCHC: 33.8 g/dL (ref 30.0–36.0)
MCV: 93.2 fL (ref 80.0–100.0)
Monocytes Absolute: 1 K/uL (ref 0.1–1.0)
Monocytes Relative: 10 %
Neutro Abs: 7.5 K/uL (ref 1.7–7.7)
Neutrophils Relative %: 75 %
Platelets: 246 K/uL (ref 150–400)
RBC: 3.65 MIL/uL — ABNORMAL LOW (ref 4.22–5.81)
RDW: 13.2 % (ref 11.5–15.5)
WBC: 10 K/uL (ref 4.0–10.5)
nRBC: 0 % (ref 0.0–0.2)

## 2023-12-06 LAB — COMPREHENSIVE METABOLIC PANEL WITH GFR
ALT: 21 U/L (ref 0–44)
AST: 30 U/L (ref 15–41)
Albumin: 2.2 g/dL — ABNORMAL LOW (ref 3.5–5.0)
Alkaline Phosphatase: 90 U/L (ref 38–126)
Anion gap: 12 (ref 5–15)
BUN: 15 mg/dL (ref 8–23)
CO2: 23 mmol/L (ref 22–32)
Calcium: 8.3 mg/dL — ABNORMAL LOW (ref 8.9–10.3)
Chloride: 100 mmol/L (ref 98–111)
Creatinine, Ser: 0.8 mg/dL (ref 0.61–1.24)
GFR, Estimated: >60 mL/min (ref >60)
Glucose, Bld: 110 mg/dL — ABNORMAL HIGH (ref 70–99)
Potassium: 4.1 mmol/L (ref 3.5–5.1)
Sodium: 135 mmol/L (ref 135–145)
Total Bilirubin: 0.7 mg/dL (ref 0.0–1.2)
Total Protein: 4.8 g/dL — ABNORMAL LOW (ref 6.5–8.1)

## 2023-12-06 MED ORDER — MELATONIN 5 MG PO TABS: 5.0000 mg | ORAL_TABLET | Freq: Every evening | ORAL | Status: DC | PRN

## 2023-12-06 NOTE — Progress Notes (Signed)
 PROGRESS NOTE   Subjective/Complaints:  No acute complaints.  No events overnight.  Patient had some difficulty sleeping, but other [place] says he does not want medication to treat this.  Vitals are stable.  A.m. labs stable.  Family at bedside, all questions answered  ROS: Denies fevers, chills, N/V, abdominal pain, constipation, diarrhea, SOB, cough, chest pain, new weakness or paraesthesias.    Objective:   No results found. Recent Labs    12/04/23 0913 12/06/23 0527  WBC 12.5* 10.0  HGB 12.1* 11.5*  HCT 36.6* 34.0*  PLT 232 246   Recent Labs    12/06/23 0527  NA 135  K 4.1  CL 100  CO2 23  GLUCOSE 110*  BUN 15  CREATININE 0.80  CALCIUM  8.3*    Intake/Output Summary (Last 24 hours) at 12/06/2023 2326 Last data filed at 12/06/2023 1834 Gross per 24 hour  Intake 360 ml  Output 725 ml  Net -365 ml        Physical Exam: Vital Signs Blood pressure 119/61, pulse 90, temperature 98 F (36.7 C), resp. rate 18, height 5' 5 (1.651 m), weight 65.6 kg, SpO2 96%.  Constitutional: No apparent distress. Appropriate appearance for age.  HENT: No JVD. Neck Supple. Trachea midline. Atraumatic, normocephalic.+ HOH Eyes: PERRLA. EOMI. Visual fields grossly intact.  Cardiovascular: RRR, no murmurs/rub/gallops. No Edema. Peripheral pulses 2+  Respiratory: CTAB. No rales, rhonchi, or wheezing. On RA.  Abdomen: + bowel sounds, normoactive. No distention or tenderness.  Skin: Some bruising across right forehead and eye, temple.  MSK:      No apparent deformity. + TTP right shoulder, in sling.  Neurologic exam:  Cognition: AAO to person, year; place with cueing Language: Some substitutions, significant difficulty word finding. Memory: Moderate deficits Insight: Fair insight into current condition.  Mood: Pleasant affect, appropriate mood.  Sensation: Equal and intact in BL UE and Les.  Reflexes: 2+ in BL UE and  LEs. Negative Hoffman's and babinski signs bilaterally.  CN: Right ptosis, otherwise grossly intact Coordination: No apparent tremors. No ataxia on FTN, HTS bilaterally.  Spasticity: MAS 0 in all extremities.  Strength: 4-5 left upper and lower extremity, 5-5 right upper shoulder and fingers; 5 out of 5 right lower extremity       Assessment/Plan: 1. Functional deficits which require 3+ hours per day of interdisciplinary therapy in a comprehensive inpatient rehab setting. Physiatrist is providing close team supervision and 24 hour management of active medical problems listed below. Physiatrist and rehab team continue to assess barriers to discharge/monitor patient progress toward functional and medical goals  Care Tool:  Bathing    Body parts bathed by patient: Right arm, Chest, Abdomen, Right upper leg, Left upper leg, Face   Body parts bathed by helper: Left arm, Front perineal area, Buttocks, Right lower leg, Left lower leg     Bathing assist Assist Level: Maximal Assistance - Patient 24 - 49%     Upper Body Dressing/Undressing Upper body dressing   What is the patient wearing?: Pull over shirt    Upper body assist Assist Level: Moderate Assistance - Patient 50 - 74%    Lower Body Dressing/Undressing Lower body dressing  What is the patient wearing?: Pants     Lower body assist Assist for lower body dressing: Total Assistance - Patient < 25%     Toileting Toileting    Toileting assist Assist for toileting: Total Assistance - Patient < 25%     Transfers Chair/bed transfer  Transfers assist  Chair/bed transfer activity did not occur: Safety/medical concerns  Chair/bed transfer assist level: Moderate Assistance - Patient 50 - 74%     Locomotion Ambulation   Ambulation assist      Assist level: Moderate Assistance - Patient 50 - 74% Assistive device: No Device Max distance: 20'   Walk 10 feet activity   Assist     Assist level: Moderate  Assistance - Patient - 50 - 74% Assistive device: No Device   Walk 50 feet activity   Assist Walk 50 feet with 2 turns activity did not occur: Safety/medical concerns         Walk 150 feet activity   Assist Walk 150 feet activity did not occur: Safety/medical concerns         Walk 10 feet on uneven surface  activity   Assist     Assist level: Minimal Assistance - Patient > 75% Assistive device: Other (comment) (Lt Handrail)   Wheelchair     Assist Is the patient using a wheelchair?: Yes Type of Wheelchair: Manual    Wheelchair assist level: Dependent - Patient 0% Max wheelchair distance: 150'    Wheelchair 50 feet with 2 turns activity    Assist        Assist Level: Dependent - Patient 0%   Wheelchair 150 feet activity     Assist      Assist Level: Dependent - Patient 0%   Blood pressure 119/61, pulse 90, temperature 98 F (36.7 C), resp. rate 18, height 5' 5 (1.651 m), weight 65.6 kg, SpO2 96%.  Medical Problem List and Plan: 1. 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  Conservative care per neurosurgery Dr. Alm Molt.             -patient may  shower             -ELOS/Goals: 14-21 days Supervision to min A             - Stable to continue CIR  2.  Antithrombotics: -DVT/anticoagulation:  Mechanical: Antiembolism stockings, thigh (TED hose) Bilateral lower extremities             -antiplatelet therapy: HOLD ASPIRIN  AND PLAVIX  X ONE WEEK THEN RESUME (10/17)  3. Pain Management: Oxycodone  as needed, Robaxin 500 mg every 8 hours as needed muscle spasms   - No complaints of pain 4. Mood/Behavior/Sleep: Provide emotional support             -antipsychotic agents: N/A  - 10-15: Complains of poor sleep, add melatonin 5 mg nightly as needed  5. Neuropsych/cognition: This patient he is capable of making decisions on his own behalf.  6. Skin/Wound Care: Routine skin checks  7.  Fluids/Electrolytes/Nutrition: Routine in and outs with follow-up chemistries   - 10-15: A.m. labs stable, albumin 2.2.  Will get dietary assessment.  8.  Seizure prophylaxis.  Keppra 750 mg twice daily x 7 days total 9.  Nondisplaced right clavicle fracture.  Conservative care per [name] orthopedic surgery.  Nonweightbearing.  Follow-up x-rays 2 weeks 10.  Hypertension.  Cardizem 240 mg daily.  Monitor with increased mobility     12/06/2023  8:10 PM 12/06/2023    9:00 AM 12/06/2023    6:00 AM  Vitals with BMI  Systolic 119 133 855  Diastolic 61 67 61  Pulse 90 87 84    11.  Hyperlipidemia.  Lipitor 12.  Hospital course hiccups.  Thorazine  10 mg every 4 hours as needed   - No hiccups 10-15 on evaluation 13.  History of popliteal aneurysm status post stenting.  Followed by vascular surgery Dr. Selinda Gu.HOLD ASPIRIN  AND PLAVIX  X ONE WEEK THEN RESUME 14.  History of AAA.  Followed outpatient by Dr. Gu 15.  Constipation.  MiraLAX twice daily, Senokot-S 2 tablets twice daily.   - Large, liquid bowel movement 10-15    LOS: 1 days A FACE TO FACE EVALUATION WAS PERFORMED  Jerry [name] Likes 12/06/2023, 11:26 PM

## 2023-12-06 NOTE — Evaluation (Signed)
 Physical Therapy Assessment and Plan  Patient Details  Name: Jerry [name] MRN: [id number] Date of Birth: 29-Mar-1928  PT Diagnosis: Difficulty walking and Muscle weakness Rehab Potential: Good ELOS: 14-17 Days   Today's Date: 12/06/2023 PT Individual Time: 8694-8584 PT Individual Time Calculation (min): 70 min    Hospital Problem: Principal Problem:   Traumatic subdural hematoma (SDH) (HCC) Active Problems:   Traumatic subdural hematoma (HCC)   Past Medical History:  Past Medical History:  Diagnosis Date   AAA (abdominal aortic aneurysm)    Aneurysm    History of multiple vascular aneurysms. [name] involve the aorta, te iliac arteries, and the popliteal arteries.  The pt is s/p stent graft of an abdominal aortic aneurysm. All of his aneurysm follow up as been done at [facility].   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 This is manifested and is elevated, sed rate, pleuritis, Raynaud's pehnomenon and she does have a positive rheumatoid factor.  He is being treated with Plquenil for his collagen vascular disease   Colonic polyp    History of nephrolithiasis    Hyperlipidemia    Hypertension    Macular degeneration    Osteoarthrosis, hip    Left hip   Paget's disease    Past Surgical History:  Past Surgical History:  Procedure Laterality Date   ABDOMINAL AORTIC ANEURYSM REPAIR     DOPPLER ECHOCARDIOGRAPHY  2006   Ef greater than 60%. There are no regional wall motion abnormalities. There was mild mitral regurgitation, there is no aortic stenosis, aortic valve was mildly calcified   HERNIA REPAIR  1998   LOWER EXTREMITY ANGIOGRAPHY Left 01/30/2023   Procedure: Lower Extremity Angiography;  Surgeon: Marea Selinda RAMAN, MD;  Location: [facility] INVASIVE CV LAB;  Service: Cardiovascular;  Laterality: Left;   Lung Hamartoma      Assessment & Plan Clinical Impression: Patient is a 89 year old right handed male with history significant for AAA, hypertension,  hyperlipidemia, macular degeneration, popliteal aneurysm status post stenting by vascular surgery 2024 maintained on aspirin  and Plavix  per Dr. Selinda Marea. Per chart review patient lives with wife. Independent and active prior to admission. Retired Nutritional therapist. Two-level home with bed and bath on main level and 3 steps to entry. Presented 11/30/2023 and by report patient had been working in his yard he had bent over and his wife was backing out the car from the driveway and did not see him knocking him to the ground. Denied loss of consciousness. He did sustain a small forehead laceration with Steri-Strips applied. CT of the head showed a small subdural hemorrhage overlying the right frontal pole and frontal right hemisphere measuring up to 0.7 cm in thickness. No significant mass effect or midline shift. No overlying skull fracture. There was a right frontal scalp hematoma. CT of the chest abdomen and pelvis negative. CT cervical spine negative. Neurosurgery Dr. Alm Molt consulted. Conservative care no surgical intervention advised to hold aspirin  and Plavix  x 1 week. He was placed on Keppra 750 mg twice daily x 7 days for seizure prophylaxis. Patient did sustain a nondisplaced right clavicle fracture discussed with orthopedic services [name] no surgical invention with sling added for comfort nonweightbearing to right upper extremity. Hospital course patient did spike a low-grade fever 12/03/2023 100.5 mild leukocytosis 13,300 improved from 17,000. Chest x-ray showed some mild basilar atelectasis and urinalysis negative nitrite.. He has had intermittent bouts of hiccups with KUB unremarkable and placed on Thorazine  as needed.  Patient transferred to CIR on 12/05/2023 .   Patient currently requires mod with mobility secondary to muscle weakness, decreased cardiorespiratoy endurance, and decreased sitting balance, decreased standing balance, decreased postural control, decreased balance strategies,  and difficulty maintaining precautions.  Prior to hospitalization, patient was independent  with mobility and lived with Spouse in a House home.  Home access is 6-7Stairs to enter.  Patient will benefit from skilled PT intervention to maximize safe functional mobility, minimize fall risk, and decrease caregiver burden for planned discharge home with 24 hour supervision.  Anticipate patient will benefit from follow up HH at discharge.  PT - End of Session Activity Tolerance: Tolerates 30+ min activity with multiple rests Endurance Deficit: Yes PT Assessment Rehab Potential (ACUTE/IP ONLY): Good PT Patient demonstrates impairments in the following area(s): Balance;Endurance;Motor;Pain;Safety PT Transfers Functional Problem(s): Bed Mobility;Bed to Chair;Car;Furniture PT Locomotion Functional Problem(s): Ambulation;Stairs PT Plan PT Intensity: Minimum of 1-2 x/day ,45 to 90 minutes PT Frequency: 5 out of 7 days PT Duration Estimated Length of Stay: 14-17 Days PT Treatment/Interventions: Ambulation/gait training;Community reintegration;DME/adaptive equipment instruction;Neuromuscular re-education;Psychosocial support;Stair training;UE/LE Strength taining/ROM;Balance/vestibular training;Discharge planning;Functional electrical stimulation;Pain management;Skin care/wound management;Therapeutic Activities;UE/LE Coordination activities;Cognitive remediation/compensation;Disease management/prevention;Functional mobility training;Patient/family education;Splinting/orthotics;Therapeutic Exercise;Visual/perceptual remediation/compensation PT Transfers Anticipated Outcome(s): Supervision PT Locomotion Anticipated Outcome(s): Supervision PT Recommendation Recommendations for Other Services: Therapeutic Recreation consult Therapeutic Recreation Interventions: Pet therapy Follow Up Recommendations: Home health PT;24 hour supervision/assistance Patient destination: Home Equipment Recommended: To be  determined   PT Evaluation Precautions/Restrictions Precautions Precautions: Fall Recall of Precautions/Restrictions: Impaired Required Braces or Orthoses: Sling Restrictions Weight Bearing Restrictions Per Provider Order: Yes RUE Weight Bearing Per Provider Order: Non weight bearing General Chart Reviewed: Yes Family/Caregiver Present: Yes Vital Signs  Pain Interference Pain Interference Pain Effect on Sleep: 1. Rarely or not at all Pain Interference with Therapy Activities: 1. Rarely or not at all Pain Interference with Day-to-Day Activities: 1. Rarely or not at all Home Living/Prior Functioning Home Living Living Arrangements: Spouse/significant other Available Help at Discharge: Family;Available 24 hours/day Type of Home: House Home Access: Stairs to enter [employer] of Steps: 6-7 Entrance Stairs-Rails: Right;Left;Can reach both Home Layout: Two level;Able to live on main level with bedroom/bathroom Bathroom Shower/Tub: Walk-in shower;Door (x1 grab bar no DME) Bathroom Toilet: Standard Bathroom Accessibility: Yes  Lives With: Spouse Prior Function Level of Independence: Independent with basic ADLs;Independent with homemaking with ambulation;Independent with gait;Independent with transfers  Able to Take Stairs?: Yes Driving: Yes Vocation: Retired Vision/Perception  Vision - History Ability to See in Adequate Light: 1 Impaired Perception Perception: Within Functional Limits Praxis Praxis: WFL  Cognition Overall Cognitive Status: Within Functional Limits for tasks assessed Arousal/Alertness: Awake/alert Orientation Level: Oriented X4 Year: 2025 Month: October Day of Week: Correct Attention: Selective Selective Attention: Appears intact Memory: Appears intact Awareness: Appears intact Problem Solving: Appears intact Executive Function: Landscape architect: Impaired Organizing Impairment: Functional complex Safety/Judgment: Appears  intact Comments: slightly reduced awareness of RUE precautions was present Sensation Sensation Light Touch: Appears Intact Coordination Gross Motor Movements are Fluid and Coordinated: No Fine Motor Movements are Fluid and Coordinated: No Motor  Motor Motor: Within Functional Limits Motor - Skilled Clinical Observations: Generalized weakness  Trunk/Postural Assessment  Cervical Assessment Cervical Assessment: Exceptions to [facility] (forward head) Thoracic Assessment Thoracic Assessment: Exceptions to [facility] (rounded shoulders) Lumbar Assessment Lumbar Assessment: Exceptions to [facility] (posterior pelvic tilt) Postural Control Postural Control: Deficits on evaluation Righting Reactions: delayed Protective Responses: inadequate  Balance Balance Balance Assessed: Yes Standardized Balance Assessment Standardized Balance Assessment: Timed Up  and Go Test Timed Up and Go Test TUG: Normal TUG (with modA and no AD) Normal TUG (seconds): 37.8 Static Sitting Balance Static Sitting - Balance Support: Feet unsupported Static Sitting - Level of Assistance: 5: Stand by assistance Dynamic Sitting Balance Dynamic Sitting - Balance Support: Feet unsupported Dynamic Sitting - Level of Assistance: 4: Min assist Static Standing Balance Static Standing - Balance Support: During functional activity Static Standing - Level of Assistance: 4: Min assist Dynamic Standing Balance Dynamic Standing - Balance Support: During functional activity Dynamic Standing - Level of Assistance: 3: Mod assist Extremity Assessment  RLE Assessment General Strength Comments: Grossly 4/5, endurance impaired LLE Assessment LLE Assessment: Exceptions to [facility] General Strength Comments: Grossly 3+/5, endurance impaired  Care Tool Care Tool Bed Mobility Roll left and right activity   Roll left and right assist level: Moderate Assistance - Patient 50 - 74%    Sit to lying activity   Sit to lying assist level: Moderate  Assistance - Patient 50 - 74%    Lying to sitting on side of bed activity   Lying to sitting on side of bed assist level: the ability to move from lying on the back to sitting on the side of the bed with no back support.: Moderate Assistance - Patient 50 - 74%     Care Tool Transfers Sit to stand transfer   Sit to stand assist level: Moderate Assistance - Patient 50 - 74%    Chair/bed transfer   Chair/bed transfer assist level: Moderate Assistance - Patient 50 - 74%    Car transfer   Car transfer assist level: Moderate Assistance - Patient 50 - 74%      Care Tool Locomotion Ambulation   Assist level: Moderate Assistance - Patient 50 - 74% Assistive device: No Device Max distance: 20'  Walk 10 feet activity   Assist level: Moderate Assistance - Patient - 50 - 74% Assistive device: No Device   Walk 50 feet with 2 turns activity Walk 50 feet with 2 turns activity did not occur: Safety/medical concerns      Walk 150 feet activity Walk 150 feet activity did not occur: Safety/medical concerns      Walk 10 feet on uneven surfaces activity   Assist level: Minimal Assistance - Patient > 75% Assistive device: Other (comment) (Lt Handrail)  Stairs   Assist level: Minimal Assistance - Patient > 75% Stairs assistive device: 1 hand rail Max number of stairs: 12  Walk up/down 1 step activity   Walk up/down 1 step (curb) assist level: Minimal Assistance - Patient > 75% Walk up/down 1 step or curb assistive device: 1 hand rail  Walk up/down 4 steps activity   Walk up/down 4 steps assist level: Minimal Assistance - Patient > 75% Walk up/down 4 steps assistive device: 1 hand rail  Walk up/down 12 steps activity   Walk up/down 12 steps assist level: Minimal Assistance - Patient > 75% Walk up/down 12 steps assistive device: 1 hand rail  Pick up small objects from floor   Pick up small object from the floor assist level: Maximal Assistance - Patient 25 - 49%    Wheelchair Is the patient  using a wheelchair?: Yes Type of Wheelchair: Manual   Wheelchair assist level: Dependent - Patient 0% Max wheelchair distance: 150'  Wheel 50 feet with 2 turns activity   Assist Level: Dependent - Patient 0%  Wheel 150 feet activity   Assist Level: Dependent - Patient 0%    Refer  to Care Plan for Long Term Goals  SHORT TERM GOAL WEEK 1 PT Short Term Goal 1 (Week 1): Pt will complete bed mobility with minA. PT Short Term Goal 2 (Week 1): Pt will complete sit to stand with minA. PT Short Term Goal 3 (Week 1): Pt will complete bed to chair transfer with minA. PT Short Term Goal 4 (Week 1): Pt will ambulate x100' with minA and LRAD.  Recommendations for other services: Therapeutic Recreation  Pet therapy  Skilled Therapeutic Intervention  Evaluation completed (see details above and below) with education on PT POC and goals and individual treatment initiated with focus on balance, transfers, ambulation, car transfer, and stair training. Pt received seated in WC and agrees to therapy. No complaint of pain. WC transport to gym for time management. PT educates on pt's RUE NWB precautions and provides reminders and verbal and tactile cueing to prevent use throughout session. Pt performs sit to stand with modA and facilitation of anterior weight shifting and body mechanics. Pt ambulates x20' with modA at trunk for stability and to promote upright posture, as well as cues to maintain upright gaze and increasing stride length to improve balance. Pt performs car transfer with modA and no AD, with cues for sequencing and PT providing manual assistance to maneuver legs into vehicle. Following rest break, pt completes ramp navigation with with LUE support on handrail and minA, with same cues. Following rest break, pt completes x12 6 steps with Lt handrail and minA, with cues for step sequencing, positioning, and preventing use of RUE. Following rest break, pt ambulates additional x100' without AD, with modA  to facilitate upright posture and pt noted to have increasing anterior bias as distance increases and pt fatigues. Following rest break, pt completes TUG with following times, 43.5 seconds, 34.8 seconds,nd 35.1 seconds. PT provides minA/modA throughout for TUG test. WC transport back to room. Left seated with all needs within reach.    Mobility Bed Mobility Bed Mobility: Supine to Sit;Sit to Supine Supine to Sit: Moderate Assistance - Patient 50-74% Sit to Supine: Moderate Assistance - Patient 50-74% Transfers Transfers: Sit to Stand;Stand to Sit;Stand Pivot Transfers Sit to Stand: Minimal Assistance - Patient > 75% Stand Pivot Transfers: Minimal Assistance - Patient > 75% Stand Pivot Transfer Details: Verbal cues for technique;Tactile cues for sequencing;Tactile cues for posture;Tactile cues for weight shifting;Verbal cues for sequencing Transfer (Assistive device): None Locomotion  Gait Ambulation: Yes Gait Assistance: Moderate Assistance - Patient 50-74% Gait Distance (Feet): 100 Feet Assistive device: None Gait Assistance Details: Verbal cues for gait pattern;Verbal cues for precautions/safety;Verbal cues for technique;Tactile cues for posture;Tactile cues for weight shifting;Tactile cues for sequencing Gait Gait: Yes Gait Pattern: Impaired Gait Pattern: Trunk flexed;Decreased stride length Stairs / Additional Locomotion Stairs: Yes Stairs Assistance: Minimal Assistance - Patient > 75% Stair Management Technique: One rail Left Number of Stairs: 12 Height of Stairs: 6 Ramp: Minimal Assistance - Patient >75% Curb: Minimal Assistance - Patient >75% Wheelchair Mobility Wheelchair Mobility: No   Discharge Criteria: Patient will be discharged from PT if patient refuses treatment 3 consecutive times without medical reason, if treatment goals not met, if there is a change in medical status, if patient makes no progress towards goals or if patient is discharged from hospital.  The  above assessment, treatment plan, treatment alternatives and goals were discussed and mutually agreed upon: by patient and by family  Elsie JAYSON Dawn, PT, DPT 12/06/2023, 4:11 PM

## 2023-12-06 NOTE — Progress Notes (Addendum)
 Orthopedic Tech Progress Note Patient Details:  [name] 10-10-1928 [id number]  Patient ID: Jerry [name] Mood, male   DOB: 02-16-1929, 89 y.o.   MRN: [id number] Per note on 10/9, patient received shoulder sling. Order to maintain shoulder immobilizer received, at 23:00 checked to see if patient could put on shoulder immobilizer but was asleep. Will check to see if patient needs arm sling or already has on sling Camellia Bo 12/06/2023, 1:14 AM

## 2023-12-06 NOTE — Progress Notes (Signed)
 Met with patient, wife, and daughter to review current situation, team conference and plan of care. Reviewed medication, pain, non weight bearing precautions and use of sling, Reviewed bowel, bladder and sleep. Continue to follow along to provide educational needs to facilitate preparation for discharge.

## 2023-12-06 NOTE — Plan of Care (Signed)
  Problem: RH Balance Goal: LTG: Patient will maintain dynamic sitting balance (OT) Description: LTG:  Patient will maintain dynamic sitting balance with assistance during activities of daily living (OT) Flowsheets (Taken 12/06/2023 1640) LTG: Pt will maintain dynamic sitting balance during ADLs with: Supervision/Verbal cueing   Problem: Sit to Stand Goal: LTG:  Patient will perform sit to stand in prep for activites of daily living with assistance level (OT) Description: LTG:  Patient will perform sit to stand in prep for activites of daily living with assistance level (OT) Flowsheets (Taken 12/06/2023 1640) LTG: PT will perform sit to stand in prep for activites of daily living with assistance level: Supervision/Verbal cueing   Problem: RH Bathing Goal: LTG Patient will bathe all body parts with assist levels (OT) Description: LTG: Patient will bathe all body parts with assist levels (OT) Flowsheets (Taken 12/06/2023 1640) LTG: Pt will perform bathing with assistance level/cueing: Supervision/Verbal cueing   Problem: RH Dressing Goal: LTG Patient will perform upper body dressing (OT) Description: LTG Patient will perform upper body dressing with assist, with/without cues (OT). Flowsheets (Taken 12/06/2023 1640) LTG: Pt will perform upper body dressing with assistance level of: Supervision/Verbal cueing Goal: LTG Patient will perform lower body dressing w/assist (OT) Description: LTG: Patient will perform lower body dressing with assist, with/without cues in positioning using equipment (OT) Flowsheets (Taken 12/06/2023 1640) LTG: Pt will perform lower body dressing with assistance level of: Supervision/Verbal cueing   Problem: RH Toileting Goal: LTG Patient will perform toileting task (3/3 steps) with assistance level (OT) Description: LTG: Patient will perform toileting task (3/3 steps) with assistance level (OT)  Flowsheets (Taken 12/06/2023 1640) LTG: Pt will perform toileting  task (3/3 steps) with assistance level: Supervision/Verbal cueing   Problem: RH Toilet Transfers Goal: LTG Patient will perform toilet transfers w/assist (OT) Description: LTG: Patient will perform toilet transfers with assist, with/without cues using equipment (OT) Flowsheets (Taken 12/06/2023 1640) LTG: Pt will perform toilet transfers with assistance level of: Supervision/Verbal cueing   Problem: RH Tub/Shower Transfers Goal: LTG Patient will perform tub/shower transfers w/assist (OT) Description: LTG: Patient will perform tub/shower transfers with assist, with/without cues using equipment (OT) Flowsheets (Taken 12/06/2023 1640) LTG: Pt will perform tub/shower stall transfers with assistance level of: Contact Guard/Touching assist   Problem: RH Balance Goal: LTG Patient will maintain dynamic standing with ADLs (OT) Description: LTG:  Patient will maintain dynamic standing balance with assist during activities of daily living (OT)  Flowsheets (Taken 12/06/2023 1641) LTG: Pt will maintain dynamic standing balance during ADLs with: Supervision/Verbal cueing

## 2023-12-06 NOTE — Evaluation (Addendum)
 Speech Language Pathology Assessment and Plan  Patient Details  Name: Jerry [name] MRN: [id number] Date of Birth: October 21, 1928  SLP Diagnosis:  (n/a skillled ST not warranted)  Rehab Potential:  (n/a skillled ST not warranted) ELOS: n/a skillled ST not warranted    Today's Date: 12/06/2023 SLP Individual Time: 1100-1200 SLP Individual Time Calculation (min): 60 min   Hospital Problem: Principal Problem:   Traumatic subdural hematoma (SDH) (HCC) Active Problems:   Traumatic subdural hematoma (HCC)  Past Medical History:  Past Medical History:  Diagnosis Date   AAA (abdominal aortic aneurysm)    Aneurysm    History of multiple vascular aneurysms. [name] involve the aorta, te iliac arteries, and the popliteal arteries.  The pt is s/p stent graft of an abdominal aortic aneurysm. All of his aneurysm follow up as been done at [facility].   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 This is manifested and is elevated, sed rate, pleuritis, Raynaud's pehnomenon and she does have a positive rheumatoid factor.  He is being treated with Plquenil for his collagen vascular disease   Colonic polyp    History of nephrolithiasis    Hyperlipidemia    Hypertension    Macular degeneration    Osteoarthrosis, hip    Left hip   Paget's disease    Past Surgical History:  Past Surgical History:  Procedure Laterality Date   ABDOMINAL AORTIC ANEURYSM REPAIR     DOPPLER ECHOCARDIOGRAPHY  2006   Ef greater than 60%. There are no regional wall motion abnormalities. There was mild mitral regurgitation, there is no aortic stenosis, aortic valve was mildly calcified   HERNIA REPAIR  1998   LOWER EXTREMITY ANGIOGRAPHY Left 01/30/2023   Procedure: Lower Extremity Angiography;  Surgeon: Marea Selinda RAMAN, MD;  Location: [facility] INVASIVE CV LAB;  Service: Cardiovascular;  Laterality: Left;   Lung Hamartoma      Assessment / Plan / Recommendation Clinical Impression Pt is a 89 yo male  who presented 11/30/2023 and by report patient had been working in his yard he had bent over and his wife was backing out the car from the driveway and did not see him knocking him to the ground.  Denied loss of consciousness.  He did sustain a small forehead laceration with Steri-Strips applied.  CT of the head showed a small subdural hemorrhage overlying the right frontal pole and frontal right hemisphere measuring up to 0.7 cm in thickness.  No significant mass effect or midline shift.  No overlying skull fracture.  There was a right frontal scalp hematoma.  CT of the chest abdomen and pelvis negative.  CT cervical spine negative.  Neurosurgery Dr. Alm Molt consulted.  Conservative care no surgical intervention advised to hold aspirin  and Plavix  x 1 week.  He was placed on Keppra 750 mg twice daily x 7 days for seizure prophylaxis.  Patient did sustain a nondisplaced right clavicle fracture discussed with orthopedic services [name] no surgical invention with sling added for comfort nonweightbearing to right upper extremity.  Hospital course patient did spike a low-grade fever 12/03/2023 100.5 mild leukocytosis 13,300 improved from 17,000.  Chest x-ray showed some mild basilar atelectasis and urinalysis negative nitrite..  He has had intermittent bouts of hiccups with KUB unremarkable and placed on Thorazine  as needed.  Therapy evaluations completed due to patient's decreased functional mobility was admitted for a comprehensive rehab program.  Pt demonstrated adequate cognition across all domains assessed. The only deficits noted (  very mild) were visuospatial organization deficits during clock drawing tasks. However, he was aware of these deficits. Adequate expressive/receptive language evident and his speech was intelligible throughout. His family reported no change in cognition since SDH. He has adequate assistance w/ IADLs at home and 24/7 assistance will be available. Skilled ST not warranted at this  time.     Skilled Therapeutic Interventions          SLP facilitated a cognitive-linguistic evaluation and brief bedside swallow screen to assess pt's cognitive-communication skills and determine need for additional skilled ST services. See above for more information.    SLP Assessment  Patient does not need any further Speech Lanaguage Pathology Services    Recommendations  SLP Diet Recommendations: Age appropriate regular solids;Thin Medication Administration: Whole meds with liquid Supervision: Patient able to self feed Recommendations for Other Services: Therapeutic Recreation consult Therapeutic Recreation Interventions: Pet therapy Patient destination: Home Follow up Recommendations: None Equipment Recommended: None recommended by SLP    SLP Frequency  (n/a skillled ST not warranted)   SLP Duration  SLP Intensity  SLP Treatment/Interventions n/a skillled ST not warranted   (n/a skillled ST not warranted)    n/a skillled ST not warranted    Pain Pain Assessment Pain Scale: 0-10 Pain Score: 0-No pain  Prior Functioning Cognitive/Linguistic Baseline: Within functional limits Type of Home: House  Lives With: Spouse Available Help at Discharge: Family;Available 24 hours/day Vocation: Retired  Architectural technologist Overall Cognitive Status: Within Functional Limits for tasks assessed Arousal/Alertness: Awake/alert Orientation Level: Oriented X4 Year: 2025 Month: October Day of Week: Correct Attention: Selective Selective Attention: Appears intact Memory: Appears intact Awareness: Appears intact Problem Solving: Appears intact Executive Function: Landscape architect: Impaired Organizing Impairment: Functional complex Safety/Judgment: Appears intact Comments: slightly reduced awareness of RUE precautions was present  Comprehension Auditory Comprehension Overall Auditory Comprehension: Appears within functional limits for tasks  assessed Expression Expression Primary Mode of Expression: Verbal Verbal Expression Overall Verbal Expression: Appears within functional limits for tasks assessed Written Expression Dominant Hand: Right Oral Motor Oral Motor/Sensory Function Overall Oral Motor/Sensory Function: Within functional limits Motor Speech Overall Motor Speech: Appears within functional limits for tasks assessed  Care Tool Care Tool Cognition Ability to hear (with hearing aid or hearing appliances if normally used Ability to hear (with hearing aid or hearing appliances if normally used): 2. Moderate difficulty - speaker has to increase volume and speak distinctly   Expression of Ideas and Wants Expression of Ideas and Wants: 4. Without difficulty (complex and basic) - expresses complex messages without difficulty and with speech that is clear and easy to understand   Understanding Verbal and Non-Verbal Content Understanding Verbal and Non-Verbal Content: 4. Understands (complex and basic) - clear comprehension without cues or repetitions  Memory/Recall Ability Memory/Recall Ability : That he or she is in a hospital/hospital unit;Current season   Motor Speech Assessment  WFL  Bedside Swallowing Assessment Thin Liquid Thin Liquid: Within functional limits Presentation: Straw   Short Term Goals: No short term goals set  Refer to Care Plan for Long Term Goals  Recommendations for other services: Therapeutic Recreation  Pet therapy  Discharge Criteria: Patient will be discharged from SLP if patient refuses treatment 3 consecutive times without medical reason, if treatment goals not met, if there is a change in medical status, if patient makes no progress towards goals or if patient is discharged from hospital.  The above assessment, treatment plan, treatment alternatives and goals were discussed and mutually agreed upon:  by patient and by family  Jerry [name] 12/06/2023, 12:17 PM

## 2023-12-06 NOTE — Progress Notes (Signed)
 Inpatient Rehabilitation Admission Medication Review by a Pharmacist  A complete drug regimen review was completed for this patient to identify any potential clinically significant medication issues.  High Risk Drug Classes Is patient taking? Indication by Medication  Antipsychotic No   Anticoagulant No   Antibiotic No   Opioid Yes Oxycodone  - pain  Antiplatelet No   Hypoglycemics/insulin No   Vasoactive Medication Yes Diltiazem - HTN  Chemotherapy No   Other Yes Tylenol  - pain Atorvastatin  - HLD Thorazine  - hiccups Santyl - wounds Keppra - sz Simethacone - gas Robaxin - spasms     Type of Medication Issue Identified Description of Issue Recommendation(s)  Drug Interaction(s) (clinically significant)     Duplicate Therapy     Allergy     No Medication Administration End Date     Incorrect Dose     Additional Drug Therapy Needed     Significant med changes from prior encounter (inform family/care partners about these prior to discharge).    Other       Clinically significant medication issues were identified that warrant physician communication and completion of prescribed/recommended actions by midnight of the next day:  No  Name of provider notified for urgent issues identified:   Provider Method of Notification:     Pharmacist comments:   Time spent performing this drug regimen review (minutes):  20   Sergio Batch, PharmD, [place], AAHIVP, CPP Infectious Disease Pharmacist 12/05/2023 2:40 PM

## 2023-12-06 NOTE — Progress Notes (Signed)
 Pt arrives to unit accompanied by his dtg. Pt is alert and oriented x4 and able to make needs known. Vitals are noted to be WNL. Report was received from [facility].

## 2023-12-06 NOTE — Evaluation (Signed)
 Occupational Therapy Assessment and Plan  Patient Details  Name: Jerry [name] MRN: [id number] Date of Birth: 12-29-1928  OT Diagnosis: abnormal posture, acute pain, muscle weakness (generalized), pain in joint, and decreased activity tolerance Rehab Potential: Rehab Potential (ACUTE ONLY): Fair ELOS: 14-17 days   Today's Date: 12/06/2023 OT Individual Time: 0850-1000 OT Individual Time Calculation (min): 70 min     Hospital Problem: Principal Problem:   Traumatic subdural hematoma (SDH) (HCC) Active Problems:   Traumatic subdural hematoma (HCC)   Past Medical History:  Past Medical History:  Diagnosis Date   AAA (abdominal aortic aneurysm)    Aneurysm    History of multiple vascular aneurysms. [name] involve the aorta, te iliac arteries, and the popliteal arteries.  The pt is s/p stent graft of an abdominal aortic aneurysm. All of his aneurysm follow up as been done at [facility].   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 This is manifested and is elevated, sed rate, pleuritis, Raynaud's pehnomenon and she does have a positive rheumatoid factor.  He is being treated with Plquenil for his collagen vascular disease   Colonic polyp    History of nephrolithiasis    Hyperlipidemia    Hypertension    Macular degeneration    Osteoarthrosis, hip    Left hip   Paget's disease    Past Surgical History:  Past Surgical History:  Procedure Laterality Date   ABDOMINAL AORTIC ANEURYSM REPAIR     DOPPLER ECHOCARDIOGRAPHY  2006   Ef greater than 60%. There are no regional wall motion abnormalities. There was mild mitral regurgitation, there is no aortic stenosis, aortic valve was mildly calcified   HERNIA REPAIR  1998   LOWER EXTREMITY ANGIOGRAPHY Left 01/30/2023   Procedure: Lower Extremity Angiography;  Surgeon: Marea Selinda RAMAN, MD;  Location: [facility] INVASIVE CV LAB;  Service: Cardiovascular;  Laterality: Left;   Lung Hamartoma      Assessment &  Plan Clinical Impression: Patient is a 89 year old right handed male with history significant for AAA, hypertension, hyperlipidemia, macular degeneration, popliteal aneurysm status post stenting by vascular surgery 2024 maintained on aspirin  and Plavix  per Dr. Selinda Marea. Per chart review patient lives with wife. Independent and active prior to admission. Retired Nutritional therapist. Two-level home with bed and bath on main level and 3 steps to entry. Presented 11/30/2023 and by report patient had been working in his yard he had bent over and his wife was backing out the car from the driveway and did not see him knocking him to the ground. Denied loss of consciousness. He did sustain a small forehead laceration with Steri-Strips applied. CT of the head showed a small subdural hemorrhage overlying the right frontal pole and frontal right hemisphere measuring up to 0.7 cm in thickness. No significant mass effect or midline shift. No overlying skull fracture. There was a right frontal scalp hematoma. CT of the chest abdomen and pelvis negative. CT cervical spine negative. Neurosurgery Dr. Alm Molt consulted. Conservative care no surgical intervention advised to hold aspirin  and Plavix  x 1 week. He was placed on Keppra 750 mg twice daily x 7 days for seizure prophylaxis. Patient did sustain a nondisplaced right clavicle fracture discussed with orthopedic services [name] no surgical invention with sling added for comfort nonweightbearing to right upper extremity. Hospital course patient did spike a low-grade fever 12/03/2023 100.5 mild leukocytosis 13,300 improved from 17,000. Chest x-ray showed some mild basilar atelectasis and urinalysis negative nitrite.. He has  had intermittent bouts of hiccups with KUB unremarkable and placed on Thorazine  as needed. Patient transferred to CIR on 12/05/2023 .    Patient currently requires Mod-Max A with basic self-care skills secondary to muscle weakness and muscle joint  tightness, decreased cardiorespiratoy endurance, decreased safety awareness, and decreased standing balance, decreased balance strategies, and difficulty maintaining precautions.  Prior to hospitalization, patient could complete BADLs independently.   Patient will benefit from skilled intervention to increase independence with basic self-care skills prior to discharge home with care partner.  Anticipate patient will require 24 hour supervision and follow up home health.  OT - End of Session Activity Tolerance: Tolerates 10 - 20 min activity with multiple rests Endurance Deficit: Yes OT Assessment Rehab Potential (ACUTE ONLY): Fair OT Barriers to Discharge: Home environment access/layout;Incontinence;Wound Care;Lack of/limited family support;Weight bearing restrictions OT Patient demonstrates impairments in the following area(s): Balance;Endurance;Pain;Safety;Skin Integrity OT Basic ADL's Functional Problem(s): Bathing;Dressing;Toileting OT Transfers Functional Problem(s): Toilet;Tub/Shower OT Plan OT Intensity: Minimum of 1-2 x/day, 45 to 90 minutes OT Frequency: 5 out of 7 days OT Duration/Estimated Length of Stay: 14-17 days OT Treatment/Interventions: Metallurgist training;Community reintegration;Discharge planning;Disease mangement/prevention;DME/adaptive equipment instruction;Functional mobility training;Neuromuscular re-education;Pain management;Patient/family education;Psychosocial support;Self Care/advanced ADL retraining;Skin care/wound managment;Therapeutic Activities;Therapeutic Exercise;UE/LE Strength taining/ROM;Wheelchair propulsion/positioning OT Basic Self-Care Anticipated Outcome(s): Supervision OT Toileting Anticipated Outcome(s): Supervision OT Bathroom Transfers Anticipated Outcome(s): Supervision OT Recommendation Recommendations for Other Services: None Patient destination: Home Follow Up Recommendations: Home health OT Equipment Recommended: To be  determined   OT Evaluation Precautions/Restrictions  Precautions Precautions: Fall Recall of Precautions/Restrictions: Impaired Precaution/Restrictions Comments: Unaware pf NWB RUE Restrictions Weight Bearing Restrictions Per Provider Order: Yes RUE Weight Bearing Per Provider Order: Non weight bearing General Chart Reviewed: Yes Family/Caregiver Present: Yes (Spouse and x2 daughters) Pain Pain Assessment Pain Scale: 0-10 Pain Score: 0-No pain Pain Location: Abdomen Pain Intervention(s): Medication (See eMAR) Home Living/Prior Functioning Home Living Available Help at Discharge: Family, Available 24 hours/day Type of Home: House Home Access: Stairs to enter [employer] of Steps: 6-7 Entrance Stairs-Rails: Right, Left, Can reach both Home Layout: Two level, Able to live on main level with bedroom/bathroom Bathroom Shower/Tub: Walk-in shower, Door (x1 grab bar no DME) Teacher, early years/pre: Yes  Lives With: Spouse IADL History Occupation: Retired Prior Function Level of Independence: Independent with basic ADLs, Independent with homemaking with ambulation, Independent with gait, Independent with transfers Driving: Yes Vocation: Retired Administrator, sports Baseline Vision/History: 1 Wears glasses Ability to See in Adequate Light: 1 Impaired Patient Visual Report: No change from baseline Vision Assessment?: Wears glasses for reading;Wears glasses for driving Perception  Perception: Within Functional Limits Praxis Praxis: WFL Cognition Cognition Overall Cognitive Status: Within Functional Limits for tasks assessed Arousal/Alertness: Awake/alert Orientation Level: Place;Person Safety/Judgment: Impaired Comments: Decreased implementation of RUE NWB precautions. Brief Interview for Mental Status (BIMS) Repetition of Three Words (First Attempt): 3 Temporal Orientation: Year: Correct Temporal Orientation: Month: Accurate within 5 days Temporal  Orientation: Day: Correct Recall: Sock: Yes, no cue required Recall: Blue: Yes, no cue required Recall: Bed: Yes, no cue required BIMS Summary Score: 15 Sensation Sensation Light Touch: Appears Intact Coordination Gross Motor Movements are Fluid and Coordinated: No Fine Motor Movements are Fluid and Coordinated: No Motor  Motor Motor: Other (comment) Motor - Skilled Clinical Observations: Generalized weakness/debility  Trunk/Postural Assessment  Cervical Assessment Cervical Assessment: Exceptions to [facility] (forward head) Thoracic Assessment Thoracic Assessment: Exceptions to [facility] (rounded shoulders) Lumbar Assessment Lumbar Assessment: Exceptions to [facility] (posterior pelvic tilt) Postural Control Postural Control:  Deficits on evaluation Righting Reactions: delayed Protective Responses: inadequate  Balance Balance Balance Assessed: Yes Standardized Balance Assessment Standardized Balance Assessment: Timed Up and Go Test Timed Up and Go Test TUG: Normal TUG (with modA and no AD) Normal TUG (seconds): 37.8 Static Sitting Balance Static Sitting - Balance Support: Feet unsupported Static Sitting - Level of Assistance: 5: Stand by assistance (SUP) Dynamic Sitting Balance Dynamic Sitting - Balance Support: Feet supported Dynamic Sitting - Level of Assistance: 5: Stand by assistance (CGA) Static Standing Balance Static Standing - Balance Support: During functional activity Static Standing - Level of Assistance: 4: Min assist Dynamic Standing Balance Dynamic Standing - Balance Support: During functional activity Dynamic Standing - Level of Assistance: 3: Mod assist Extremity/Trunk Assessment RUE Assessment RUE Assessment: Exceptions to [facility] Active Range of Motion (AROM) Comments: Limited by WB precautions General Strength Comments: Limited by WB precautions LUE Assessment LUE Assessment: Within Functional Limits  Care Tool Care Tool Self Care Eating   Eating Assist  Level: Set up assist    Oral Care    Oral Care Assist Level: Set up assist    Bathing   Body parts bathed by patient: Right arm;Chest;Abdomen;Right upper leg;Left upper leg;Face Body parts bathed by helper: Left arm;Front perineal area;Buttocks;Right lower leg;Left lower leg   Assist Level: Maximal Assistance - Patient 24 - 49%    Upper Body Dressing(including orthotics)   What is the patient wearing?: Pull over shirt   Assist Level: Moderate Assistance - Patient 50 - 74%    Lower Body Dressing (excluding footwear)   What is the patient wearing?: Pants Assist for lower body dressing: Total Assistance - Patient < 25%    Putting on/Taking off footwear   What is the patient wearing?: Non-skid slipper socks Assist for footwear: Total Assistance - Patient < 25%       Care Tool Toileting Toileting activity   Assist for toileting: Total Assistance - Patient < 25%     Care Tool Bed Mobility Roll left and right activity        Sit to lying activity        Lying to sitting on side of bed activity         Care Tool Transfers Sit to stand transfer        Chair/bed transfer         Toilet transfer   Assist Level: Moderate Assistance - Patient 50 - 74%     Care Tool Cognition  Expression of Ideas and Wants Expression of Ideas and Wants: 4. Without difficulty (complex and basic) - expresses complex messages without difficulty and with speech that is clear and easy to understand  Understanding Verbal and Non-Verbal Content Understanding Verbal and Non-Verbal Content: 4. Understands (complex and basic) - clear comprehension without cues or repetitions   Memory/Recall Ability Memory/Recall Ability : That he or she is in a hospital/hospital unit;Current season   Refer to Care Plan for Long Term Goals  SHORT TERM GOAL WEEK 1 OT Short Term Goal 1 (Week 1): Pt will perform toilet transfer with Min A + LRAD. OT Short Term Goal 2 (Week 1): Pt will perform 1/3 toileting tasks  with Min A for standing balance. OT Short Term Goal 3 (Week 1): Pt will thread LB garments with Min A + LRAD.  Recommendations for other services: None    Skilled Therapeutic Intervention Session began with introduction to OT role, OT POC, and general orientation to rehab unit/schedule. Pt completes full-body sponge-bathing with  levels of assistance noted below. Pt remained sitting in WC, posey belt activated.  ADL ADL Eating: Set up Where Assessed-Eating: Bed level Grooming: Setup Where Assessed-Grooming: Edge of bed Upper Body Bathing: Moderate assistance Where Assessed-Upper Body Bathing: Edge of bed Lower Body Bathing: Moderate assistance;Maximal assistance Where Assessed-Lower Body Bathing: Edge of bed Upper Body Dressing: Moderate assistance Where Assessed-Upper Body Dressing: Edge of bed Lower Body Dressing: Dependent Where Assessed-Lower Body Dressing: Edge of bed Toileting: Maximal assistance Where Assessed-Toileting: Bedside Commode Toilet Transfer: Moderate assistance Toilet Transfer Method: Stand pivot Toilet Transfer Equipment: Gaffer: Not assessed Film/video editor: Not assessed Mobility  Bed Mobility Bed Mobility: Supine to Sit;Sit to Supine Supine to Sit: Moderate Assistance - Patient 50-74% Sit to Supine: Moderate Assistance - Patient 50-74% Transfers Sit to Stand: Minimal Assistance - Patient > 75%;Moderate Assistance - Patient 50-74% Stand to Sit: Minimal Assistance - Patient > 75%;Moderate Assistance - Patient 50-74%   Discharge Criteria: Patient will be discharged from OT if patient refuses treatment 3 consecutive times without medical reason, if treatment goals not met, if there is a change in medical status, if patient makes no progress towards goals or if patient is discharged from hospital.  The above assessment, treatment plan, treatment alternatives and goals were discussed and mutually agreed upon: by  patient  Nereida Habermann, OTR/L, MSOT  12/06/2023, 10:04 AM

## 2023-12-06 NOTE — Discharge Instructions (Addendum)
 Inpatient Rehab Discharge Instructions  Baylon Santelli Seawright Discharge date and time: No discharge date for patient encounter.   Activities/Precautions/ Functional Status: Activity: Nonweightbearing right upper extremity with shoulder sling Diet: regular diet Wound Care: Routine skin checks Functional status:  ___ No restrictions     ___ Walk up steps independently ___ 24/7 supervision/assistance   ___ Walk up steps with assistance ___ Intermittent supervision/assistance  ___ Bathe/dress independently ___ Walk with walker     _x__ Bathe/dress with assistance ___ Walk Independently    ___ Shower independently ___ Walk with assistance    ___ Shower with assistance ___ No alcohol     ___ Return to work/school ________  Special Instructions:  No driving smoking or alcohol     COMMUNITY REFERRALS UPON DISCHARGE:    Home Health:   PT      OT   SNA                 Agency: [facility]    Phone: [phone] *Please expect follow-up within 2-3 business days for discharge to schedule your home visit. If you have not received follow-up, be sure to contact the site directly.*     Medical Equipment/Items Ordered:3in1 bedside commode and rollator                                                 Agency/Supplier:[facility] [id number]  GENERAL COMMUNITY RESOURCES FOR PATIENT/FAMILY: If you would like to determine benefit options with the [facility], please be sure to contact : Eligibility & Enrollment [phone], ext. [phone] to establish primary care  My questions have been answered and I understand these instructions. I will adhere to these goals and the provided educational materials after my discharge from the hospital.  Patient/Caregiver Signature _______________________________ Date __________  Clinician Signature _______________________________________ Date __________  Please bring this form and your medication list with you to all your follow-up doctor's appointments.

## 2023-12-06 NOTE — Progress Notes (Signed)
 Inpatient Rehabilitation Care Coordinator Assessment and Plan Patient Details  Name: Jerry [name] MRN: [id number] Date of Birth: 08/16/1928  Today's Date: 12/06/2023  Hospital Problems: Principal Problem:   Traumatic subdural hematoma (SDH) (HCC) Active Problems:   Traumatic subdural hematoma [facility])  Past Medical History:  Past Medical History:  Diagnosis Date   AAA (abdominal aortic aneurysm)    Aneurysm    History of multiple vascular aneurysms. [name] involve the aorta, te iliac arteries, and the popliteal arteries.  The pt is s/p stent graft of an abdominal aortic aneurysm. All of his aneurysm follow up as been done at [facility].   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 This is manifested and is elevated, sed rate, pleuritis, Raynaud's pehnomenon and she does have a positive rheumatoid factor.  He is being treated with Plquenil for his collagen vascular disease   Colonic polyp    History of nephrolithiasis    Hyperlipidemia    Hypertension    Macular degeneration    Osteoarthrosis, hip    Left hip   Paget's disease    Past Surgical History:  Past Surgical History:  Procedure Laterality Date   ABDOMINAL AORTIC ANEURYSM REPAIR     DOPPLER ECHOCARDIOGRAPHY  2006   Ef greater than 60%. There are no regional wall motion abnormalities. There was mild mitral regurgitation, there is no aortic stenosis, aortic valve was mildly calcified   HERNIA REPAIR  1998   LOWER EXTREMITY ANGIOGRAPHY Left 01/30/2023   Procedure: Lower Extremity Angiography;  Surgeon: Marea Selinda RAMAN, MD;  Location: [facility] INVASIVE CV LAB;  Service: Cardiovascular;  Laterality: Left;   Lung Hamartoma     Social History:  reports that he has quit smoking. He has never used smokeless tobacco. He reports current alcohol use. No history on file for drug use.  Family / Support Systems Marital Status: Married How Long?: 68 years Patient Roles: Spouse, Parent Spouse/Significant Other:  Jerry [name] (wife) [phone] Children: 3 children- Jerry [name] (lives in [place] but will be here through next week), Jerry [name] (livesa in [place]), and Jerry [name] (lives in [place]) Other Supports: none reported Anticipated Caregiver: wife Ability/Limitations of Caregiver: wife is primary caregiver. Pt will need to be as independent as possible. Pt dtr Jerry [name] up here from [facility] will be here but unable to stay here 24/7. Caregiver Availability: 24/7 Family Dynamics: Pt lives with his wife.  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 Education: college Dentist - How often do you need to have someone help you when you read instructions, pamphlets, or other written material from your doctor or pharmacy?: Never Writes: Yes Employment Status: Retired Date Retired/Disabled/Unemployed: 1997 Marine scientist Issues: Denies Guardian/Conservator: Building surveyor (dtr) [phone]   Abuse/Neglect Abuse/Neglect Assessment Can Be Completed: Yes Physical Abuse: Denies Verbal Abuse: Denies Sexual Abuse: Denies Exploitation of patient/patient's resources: Denies Self-Neglect: Denies  Patient response to: Social Isolation - How often do you feel lonely or isolated from those around you?: Never  Emotional Status Pt's affect, behavior and adjustment status: Pt in good spirirs at time of visit Recent Psychosocial Issues: Denies Psychiatric History: Denies Substance Abuse History: Denies  Patient / Family Perceptions, Expectations & Goals Pt/Family understanding of illness & functional limitations: Pt and family have a general understanding of care  needs Premorbid pt/family roles/activities: Independent Anticipated changes in roles/activities/participation: Assistance with ADLs/IADLs Pt/family expectations/goals: Pt would like to work  on walking and being able to get up and go to the  bathroom on his own.  Community Resources [name]: None Premorbid Home Care/DME Agencies: None Transportation available at discharge: TBD Is the patient able to respond to transportation needs?: Yes In the past 12 months, has lack of transportation kept you from medical appointments or from getting medications?: No In the past 12 months, has lack of transportation kept you from meetings, work, or from getting things needed for daily living?: No Resource referrals recommended: Neuropsychology  Discharge Planning Living Arrangements: Spouse/significant other Support Systems: Spouse/significant other, Children Type of Residence: Private residence Insurance Resources: [employer] Financial Resources: Social Security Financial Screen Referred: No Living Expenses: Own Money Management: Spouse Does the patient have any problems obtaining your medications?: No Home Management: Pt prepared all meals at the home. He did some light cleaning in his room and vacuumed at times as well. Patient/Family Preliminary Plans: TBD Care Coordinator Barriers to Discharge: Decreased caregiver support, Lack of/limited family support Care Coordinator Anticipated Follow Up Needs: HH/OP  Clinical Impression SW met with pt, wife, and dtr Jerry [name]. Pt is pleasant during encounter. DME- RW and cane that is not being used. SW will follow-up with [facility] to inquire about his service connection.   Topacio Cella A Dominica Kent 12/06/2023, 1:16 PM

## 2023-12-06 NOTE — Plan of Care (Signed)
  Problem: Consults Goal: RH BRAIN INJURY PATIENT EDUCATION Description: Description: See Patient Education module for eduction specifics Outcome: Progressing   Problem: RH BOWEL ELIMINATION Goal: RH STG MANAGE BOWEL WITH ASSISTANCE Description: STG Manage Bowel with mod I Assistance. Outcome: Progressing Goal: RH STG MANAGE BOWEL W/MEDICATION W/ASSISTANCE Description: STG Manage Bowel with Medication with mod I  Assistance. Outcome: Progressing   Problem: RH BLADDER ELIMINATION Goal: RH STG MANAGE BLADDER WITH ASSISTANCE Description: STG Manage Bladder With toileting Assistance Outcome: Progressing   Problem: RH SAFETY Goal: RH STG ADHERE TO SAFETY PRECAUTIONS W/ASSISTANCE/DEVICE Description: STG Adhere to Safety Precautions With cues Assistance/Device. Outcome: Progressing   Problem: RH COGNITION-NURSING Goal: RH STG USES MEMORY AIDS/STRATEGIES W/ASSIST TO PROBLEM SOLVE Description: STG Uses Memory Aids/Strategies With cues Assistance to Problem Solve. Outcome: Progressing   Problem: RH PAIN MANAGEMENT Goal: RH STG PAIN MANAGED AT OR BELOW PT'S PAIN GOAL Description: Pain < 4 with prns Outcome: Progressing   Problem: RH KNOWLEDGE DEFICIT BRAIN INJURY Goal: RH STG INCREASE KNOWLEDGE OF SELF CARE AFTER BRAIN INJURY Description: Patient and spouse will be able to manage care using educational resources for medications and dietary modification, etc, independently Outcome: Progressing

## 2023-12-06 NOTE — Progress Notes (Signed)
 Inpatient Rehabilitation  Patient information reviewed and entered into eRehab system by [facility]. Karen Kays., CCC/SLP, PPS Coordinator.  Information including medical coding, functional ability and quality indicators will be reviewed and updated through discharge.
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
## 2023-12-07 DIAGNOSIS — S065X9S Traumatic subdural hemorrhage with loss of consciousness of unspecified duration, sequela: Secondary | ICD-10-CM | POA: Diagnosis not present

## 2023-12-07 DIAGNOSIS — E440 Moderate protein-calorie malnutrition: Secondary | ICD-10-CM | POA: Insufficient documentation

## 2023-12-07 MED ORDER — METHOCARBAMOL 500 MG PO TABS
500.0000 mg | ORAL_TABLET | Freq: Three times a day (TID) | ORAL | Status: DC
Start: 2023-12-07 — End: 2023-12-14
  Administered 2023-12-07 – 2023-12-14 (×20): 500 mg via ORAL
  Filled 2023-12-07 (×20): qty 1

## 2023-12-07 MED ORDER — MELATONIN 5 MG PO TABS
5.0000 mg | ORAL_TABLET | Freq: Every day | ORAL | Status: DC
  Administered 2023-12-07 – 2023-12-10 (×4): 5 mg via ORAL
  Filled 2023-12-07 (×4): qty 1

## 2023-12-07 NOTE — Progress Notes (Signed)
 Occupational Therapy Session Note  Patient Details  Name: Jerry [name] MRN: [id number] Date of Birth: [birth year]  Today's Date: 12/07/2023 OT Individual Time: 0805-0900 OT Individual Time Calculation (min): 55 min   Short Term Goals: Week 1:  OT Short Term Goal 1 (Week 1): Pt will perform toilet transfer with Min A + LRAD. OT Short Term Goal 2 (Week 1): Pt will perform 1/3 toileting tasks with Min A for standing balance. OT Short Term Goal 3 (Week 1): Pt will thread LB garments with Min A + LRAD.  Skilled Therapeutic Interventions/Progress Updates:  Pt greeted sitting in [facility] for skilled OT session with focus on functional transfers, toileting, and sink-side grooming.   Pain: Pt with un-rated pain in B-flanks, OT offering intermediate rest breaks and positioning suggestions throughout session to address pain/fatigue and maximize participation/safety in session. Pre-medicated.  Functional Transfers: Sit>stands with Min A + LUE support on WC arm-rest/grab bar. Pt ambulates into/out of bathroom for toileting with use of hurrycane + overall Min A, cuing required for step length/height and upright gaze.   Self Care Tasks: Pt completes the following self care tasks with levels of assistance noted below, 3/3 toileting tasks with Mod A for clothing management on R-side.  Sink-side grooming with setup A.   Pt remained sitting in WC with 4Ps assessed and immediate needs met. Pt continues to be appropriate for skilled OT intervention to promote further functional independence in ADLs/IADLs.   Therapy Documentation Precautions:  Precautions Precautions: Fall Recall of Precautions/Restrictions: Impaired Precaution/Restrictions Comments: Unaware pf NWB RUE Required Braces or Orthoses: Sling Restrictions Weight Bearing Restrictions Per Provider Order: Yes RUE Weight Bearing Per Provider Order: Non weight bearing   Therapy/Group: Individual Therapy  Nereida Habermann, OTR/L,  MSOT  12/07/2023, 7:52 AM

## 2023-12-07 NOTE — Progress Notes (Signed)
 PROGRESS NOTE   Subjective/Complaints:  Patient complaining of poor sleep overnight, states he is having pain in his bilateral flanks.  Had difficulty getting comfortable in the bed overnight.  Did not use as needed melatonin.  Wife at bedside, concerned regarding squamous cell carcinoma on right lower extremity staying covered. Vital stable, labs stable    ROS: Denies fevers, chills, N/V, abdominal pain, constipation, diarrhea, SOB, cough, chest pain, new weakness or paraesthesias.    + Flank pain  Objective:   No results found. Recent Labs    12/06/23 0527  WBC 10.0  HGB 11.5*  HCT 34.0*  PLT 246   Recent Labs    12/06/23 0527  NA 135  K 4.1  CL 100  CO2 23  GLUCOSE 110*  BUN 15  CREATININE 0.80  CALCIUM  8.3*    Intake/Output Summary (Last 24 hours) at 12/07/2023 1632 Last data filed at 12/07/2023 1628 Gross per 24 hour  Intake 480 ml  Output 700 ml  Net -220 ml        Physical Exam: Vital Signs Blood pressure (!) 126/59, pulse 83, temperature (!) 97.5 F (36.4 C), resp. rate 18, height 5' 5 (1.651 m), weight 65.6 kg, SpO2 100%.  Constitutional: No apparent distress. Appropriate appearance for age.  Sitting upright in bed. HENT: No JVD. Neck Supple. Trachea midline. Atraumatic, normocephalic.+ HOH Eyes: PERRLA. EOMI. Visual fields grossly intact.  Cardiovascular: RRR, no murmurs/rub/gallops. No Edema. Peripheral pulses 2+  Respiratory: CTAB. No rales, rhonchi, or wheezing. On RA.  Abdomen: + bowel sounds, normoactive. No distention or tenderness.  Skin: Some bruising across right forehead and eye, temple.--Stable + Scraping across right knee, with some punctate bleeding, stable + Well-circumscribed dark lesion on right anterior shin, dry  MSK:      No apparent deformity. + Minimal TTP right shoulder, in sling.   + TTP bilateral quadratus muscles just inferior to 11th and 12th ribs; negative  Lloyd's punch.  Neurologic exam:  Cognition: AAO to person, place, year and month.  Can get day with cues. Language: Some substitutions, significant difficulty word finding. Memory: Moderate deficits Insight: Fair insight into current condition.  Mood: Pleasant affect, appropriate mood.  Sensation: Equal and intact in BL UE and Les.  Reflexes:Negative Hoffman's and babinski signs bilaterally.  CN: Right ptosis, otherwise grossly intact Coordination: No apparent tremors. No ataxia on FTN, HTS bilaterally.  Spasticity: MAS 0 in all extremities.  Strength: 4-5 left upper and lower extremity, 5-5 right upper shoulder and fingers; 5 out of 5 right lower extremity      Assessment/Plan: 1. Functional deficits which require 3+ hours per day of interdisciplinary therapy in a comprehensive inpatient rehab setting. Physiatrist is providing close team supervision and 24 hour management of active medical problems listed below. Physiatrist and rehab team continue to assess barriers to discharge/monitor patient progress toward functional and medical goals  Care Tool:  Bathing    Body parts bathed by patient: Right arm, Chest, Abdomen, Right upper leg, Left upper leg, Face   Body parts bathed by helper: Left arm, Front perineal area, Buttocks, Right lower leg, Left lower leg     Bathing assist Assist Level:  Maximal Assistance - Patient 24 - 49%     Upper Body Dressing/Undressing Upper body dressing   What is the patient wearing?: Pull over shirt    Upper body assist Assist Level: Moderate Assistance - Patient 50 - 74%    Lower Body Dressing/Undressing Lower body dressing      What is the patient wearing?: Pants     Lower body assist Assist for lower body dressing: Total Assistance - Patient < 25%     Toileting Toileting    Toileting assist Assist for toileting: Total Assistance - Patient < 25%     Transfers Chair/bed transfer  Transfers assist  Chair/bed transfer activity  did not occur: Safety/medical concerns  Chair/bed transfer assist level: Minimal Assistance - Patient > 75%     Locomotion Ambulation   Ambulation assist      Assist level: Moderate Assistance - Patient 50 - 74% Assistive device: Hand held assist Max distance: 123ft   Walk 10 feet activity   Assist     Assist level: Moderate Assistance - Patient - 50 - 74% Assistive device: No Device, Hand held assist   Walk 50 feet activity   Assist Walk 50 feet with 2 turns activity did not occur: Safety/medical concerns  Assist level: Moderate Assistance - Patient - 50 - 74% Assistive device: Hand held assist, No Device    Walk 150 feet activity   Assist Walk 150 feet activity did not occur: Safety/medical concerns         Walk 10 feet on uneven surface  activity   Assist     Assist level: Minimal Assistance - Patient > 75% Assistive device: Other (comment) (Lt Handrail)   Wheelchair     Assist Is the patient using a wheelchair?: Yes Type of Wheelchair: Manual    Wheelchair assist level: Dependent - Patient 0% Max wheelchair distance: 150'    Wheelchair 50 feet with 2 turns activity    Assist        Assist Level: Dependent - Patient 0%   Wheelchair 150 feet activity     Assist      Assist Level: Dependent - Patient 0%   Blood pressure (!) 126/59, pulse 83, temperature (!) 97.5 F (36.4 C), resp. rate 18, height 5' 5 (1.651 m), weight 65.6 kg, SpO2 100%.  Medical Problem List and Plan: 1. 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  Conservative care per neurosurgery Dr. Alm Molt.             -patient may  shower             -ELOS/Goals: 14-21 days Supervision to min A             - Stable to continue CIR  2.  Antithrombotics: -DVT/anticoagulation:  Mechanical: Antiembolism stockings, thigh (TED hose) Bilateral lower extremities             -antiplatelet therapy: HOLD ASPIRIN  AND PLAVIX  X ONE WEEK  THEN RESUME (10/17)  3. Pain Management: Oxycodone  as needed, Robaxin 500 mg every 8 hours as needed muscle spasms   - No complaints of pain   - 10-16: Bilateral flank pain likely musculoskeletal; discussed with nursing, add aqua thermia, scheduled Robaxin 500 mg 3 times daily  4. Mood/Behavior/Sleep: Provide emotional support             -antipsychotic agents: N/A  - 10-15: Complains of poor sleep, add melatonin 5 mg nightly as needed--scheduled 10-16  5. Neuropsych/cognition: This patient  he is capable of making decisions on his own behalf.  6. 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 Fluids/Electrolytes/Nutrition: Routine in and outs with follow-up chemistries   - 10-15: A.m. labs stable, albumin 2.2.  Will get dietary assessment.  8.  Seizure prophylaxis.  Keppra 750 mg twice daily x 7 days total 9.  Nondisplaced right clavicle fracture.  Conservative care per [name] orthopedic surgery.  Nonweightbearing.  Follow-up x-rays 2 weeks 10.  Hypertension.  Cardizem 240 mg daily.  Monitor with increased mobility   - Vitals well-controlled on current regimen    12/07/2023    1:54 PM 12/07/2023    4:46 AM 12/06/2023    8:10 PM  Vitals with BMI  Systolic 126 129 880  Diastolic 59 66 61  Pulse 83 73 90    11.  Hyperlipidemia.  Lipitor 12.  Hospital course hiccups.  Thorazine  10 mg every 4 hours as needed   - No hiccups 10-15 on evaluation 13.  History of popliteal aneurysm status post stenting.  Followed by vascular surgery Dr. Selinda Gu.HOLD ASPIRIN  AND PLAVIX  X ONE WEEK THEN RESUME 14.  History of AAA.  Followed outpatient by Dr. Gu 15.  Constipation.  MiraLAX twice daily, Senokot-S 2 tablets twice daily.   - Large, liquid bowel movement 10-15    LOS: 2 days A FACE TO FACE EVALUATION WAS PERFORMED  Joesph JAYSON Likes 12/07/2023, 4:32 PM

## 2023-12-07 NOTE — Progress Notes (Signed)
 Physical Therapy Session Note  Patient Details  Name: Jerry [name] MRN: [id number] Date of Birth: 07-12-1928  Today's Date: 12/07/2023 PT Individual Time: 9268-9186 and 1000-1040 PT Individual Time Calculation (min): 42 min and 40 min  Short Term Goals: Week 1:  PT Short Term Goal 1 (Week 1): Pt will complete bed mobility with minA. PT Short Term Goal 2 (Week 1): Pt will complete sit to stand with minA. PT Short Term Goal 3 (Week 1): Pt will complete bed to chair transfer with minA. PT Short Term Goal 4 (Week 1): Pt will ambulate x100' with minA and LRAD.  Skilled Therapeutic Interventions/Progress Updates:   Treatment Session 1 Received pt semi-reclined in bed with wife at bedside. Pt reported having rough night, not sleeping due to pain and being cold but agreeable to PT treatment and reported pain in RUE (unrated) - RN notified. Session with emphasis on functional mobility/transfers, dressing, generalized strengthening and endurance, and dynamic standing balance/coordination. Pt demo poor adherence to RUE NWB precautions and required max cues throughout session. Pt transferred semi-reclined<>sitting EOB with HOB elevated and use of bedrails with heavy min A for trunk control and LE management with increased time. Donned sling and pants sitting EOB with max A while RN administered medications. Pt transferred into WC via stand<>pivot without AD and min A and donned socks and shoes with max A. Sat in WC at sink and washed face/brushed teeth with setup assist.   Due to time restrictions, stayed in room and performed the following seated exercises with emphasis on LE strength: -knee extensions 2x15 bilaterally -hip flexion 2x15 bilaterally -hip adduction pillow squeezes 2x10 -hip abduction with red TB 2x15 Concluded session with pt sitting in [facility] with all needs within reach and wife at bedside, awaiting upcoming OT session.   Treatment Session 2 Received pt sitting in [facility] with wife at  bedside. Pt continued to c/o not feeling well and closing eyes throughout session. Pt's wife c/o that he overdid it in therapy yesterday - explained that it is normal to have good days and bad days but goal is to stay mobile. Pt agreeable to PT treatment with encouragement and reported pain in R ribcage (premedicated). Session with emphasis on functional mobility/transfers, generalized strengthening and endurance, dynamic standing balance/coordination, and ambulation. MD arrived for morning rounds and pt transported to/from room in [facility] dependently for time management purposes.   Stood without AD and min A and ambulated 115ft with L HHA and heavy min/light mod A - cues to increase stride, for upright posture, and to increase cadence. Transitioned to seated BLE strengthening on Kinetron at 20 cm/sec for 1 minute x 4 trials with emphasis on glute/quad strength. Returned to room and returned to bed to nap before afternoon PT session. Stood from [facility] with min A and ambulated to bed with min HHA. Transitioned into supine with supervision and ++ time and concluded session with pt semi-reclined in bed, needs within reach, and bed alarm on. Wife and daughter present at bedside.   Therapy Documentation Precautions:  Precautions Precautions: Fall Recall of Precautions/Restrictions: Impaired Precaution/Restrictions Comments: Unaware pf NWB RUE Required Braces or Orthoses: Sling Restrictions Weight Bearing Restrictions Per Provider Order: Yes RUE Weight Bearing Per Provider Order: Non weight bearing  Therapy/Group: Individual Therapy Therisa HERO Zaunegger Therisa Stains PT, DPT 12/07/2023, 6:57 AM

## 2023-12-07 NOTE — Progress Notes (Signed)
 Physical Therapy Session Note  Patient Details  Name: Jerry [name] MRN: [id number] Date of Birth: 11/03/1928  Today's Date: 12/07/2023 PT Individual Time: 1405-1502 PT Individual Time Calculation (min): 57 min   Short Term Goals: Week 1:  PT Short Term Goal 1 (Week 1): Pt will complete bed mobility with minA. PT Short Term Goal 2 (Week 1): Pt will complete sit to stand with minA. PT Short Term Goal 3 (Week 1): Pt will complete bed to chair transfer with minA. PT Short Term Goal 4 (Week 1): Pt will ambulate x100' with minA and LRAD.  Skilled Therapeutic Interventions/Progress Updates:     Pt received supine in bed and agrees to therapy. No complaint of pain. Pt performs supine to sit with modA and cues for sequencing and positioning. PT assists to don shoes with pt seated at EOB. PT reminds pt to maintain NWB precautions for RUE. Pt performs sit to stand with minA and cues for anterior weight shifting. Pt ambulates x125' with minA increasing to modA as fatigue increases, with manual cues to prevent anterior LOB. Seated rest break. Pt then performs x20 alternating LAWs with cues for correct performance. Pt performs repeated reps of sit to stand to work on functional strengthening and balance. Initially from elevated table in sets of x5, and subsequent sets from gradually decreasing table height. First two sets completed with cues for body mechanics, and final set rewquires minA to prevomote anterior weight shifting.   Pt ambulates to Nustep with minA and cues for upright posture and positioning. Pt completes Nustep with BLEs and LUE to challenge endurance. Pt completes x10:00 at workload of 5 with average step per minute ~45. Pt provide cues for hand and foot placement and completing full available ROM. Following seated rest break, pt ambulates x100' back to room with minA at trunk and cues for increasing stride length to improve balance. ModA for sit to supine with management BLEs. Left supine  with all needs within reach.   Therapy Documentation Precautions:  Precautions Precautions: Fall Recall of Precautions/Restrictions: Impaired Precaution/Restrictions Comments: Unaware pf NWB RUE Required Braces or Orthoses: Sling Restrictions Weight Bearing Restrictions Per Provider Order: Yes RUE Weight Bearing Per Provider Order: Non weight bearing   Therapy/Group: Individual Therapy  Elsie JAYSON Dawn, PT, DPT 12/07/2023, 4:03 PM

## 2023-12-07 NOTE — Progress Notes (Signed)
 Initial Nutrition Assessment  DOCUMENTATION CODES:   Non-severe (moderate) malnutrition in context of social or environmental circumstances  INTERVENTION:  Ensure Plus High Protein po BID, each supplement provides 350 kcal and 20 grams of protein.  Magic cup TID with meals, each supplement provides 290 kcal and 9 grams of protein  Encouraged adequate PO intake; high calorie, high protein  NUTRITION DIAGNOSIS:   Moderate Malnutrition related to social / environmental circumstances as evidenced by moderate muscle depletion, moderate fat depletion, severe muscle depletion.   GOAL:   Patient will meet greater than or equal to 90% of their needs  MONITOR:   PO intake, Supplement acceptance, Weight trends  REASON FOR ASSESSMENT:   Consult Assessment of nutrition requirement/status  ASSESSMENT:   Pt with hx of HTN, HLD, popliteal aneurysm s/p stenting surgery 2024, and AAA. Recent admission for subdural hematoma resulted from falling to ground. Pt independent and mobile prior to admission. Admitted to CIR for decreased functional mobility.  Pt and pt's wife at bedside. Pt reports good appetite, states he had a delicious breakfast this morning. Average intake around 60% since admission. Pt states no issues with GI discomforts or pain at this time. Pt endorses poor sleep last night due to being cold and having some discomfort laying down. Pt agreeable to Ensure shakes and magic cups to help promote adequate intake while admitted.  PTA, pt reports he was independent and did most of the cooking. Pt reports he was a good eater and ate 3x per day. 24 hr recall: B: cereal w/ milk L: sandwich (tomato OR banana w/ peanut butter OR banana w/ mayo OR deli sandwich) D: main meal; some type of meat/pasta w/ vegetable Bev: coffee, milk, water, Ensure (occasionally)  Pt reports no recent wt loss, but physical exam shows moderate fat depletion and moderate to severe muscle depletion. Chart  review shows 9% loss since April which is not clinically significant for the timeframe, but 10% loss would be so wt loss still concerning. Pt currently only meets criteria for moderate malnutrition but muscle depletion and wt loss if it continues would likely meet criteria for severe malnutrition. Discussed continuing high protein high calorie intake while admitted and at home. Encourage continued Ensure intake at home.   Average Meal Completion: 10/15-10/16: 60% average intake x 3 recorded meals  Medications: Miralax Senna  Labs reviewed  NUTRITION - FOCUSED PHYSICAL EXAM:  Flowsheet Row Most Recent Value  Orbital Region Moderate depletion  Upper Arm Region Moderate depletion  Thoracic and Lumbar Region Unable to assess  Buccal Region Moderate depletion  Temple Region Moderate depletion  Clavicle Bone Region Severe depletion  Clavicle and Acromion Bone Region Severe depletion  Scapular Bone Region Severe depletion  Dorsal Hand Severe depletion  Patellar Region Moderate depletion  Anterior Thigh Region Moderate depletion  Posterior Calf Region Moderate depletion  Edema (RD Assessment) None  Hair Reviewed  Eyes Reviewed  Mouth Reviewed  Skin Reviewed  Nails Reviewed    Diet Order:   Diet Order             Diet regular Room service appropriate? Yes; Fluid consistency: Thin  Diet effective now                   EDUCATION NEEDS:   Education needs have been addressed  Skin:  Skin Assessment: Reviewed RN Assessment  Last BM:  10/15 type 7  Height:   Ht Readings from Last 1 Encounters:  12/05/23 5' 5 (1.651 m)  Weight:   Wt Readings from Last 1 Encounters:  12/05/23 65.6 kg    BMI:  Body mass index is 24.07 kg/m.  Estimated Nutritional Needs:   Kcal:  1600-1800  Protein:  65-85g  Fluid:  1.6-1.8L    Josette Glance, MS, RDN, LDN Clinical Dietitian I Please reach out via secure chat

## 2023-12-07 NOTE — Care Management (Signed)
 Inpatient Rehabilitation Center Individual Statement of Services  Patient Name:  Jerry [name]  Date:  12/07/2023  Welcome to the Inpatient Rehabilitation Center.  Our goal is to provide you with an individualized program based on your diagnosis and situation, designed to meet your specific needs.  With this comprehensive rehabilitation program, you will be expected to participate in at least 3 hours of rehabilitation therapies Monday-Friday, with modified therapy programming on the weekends.  Your rehabilitation program will include the following services:  Physical Therapy (PT), Occupational Therapy (OT), Speech Therapy (ST), 24 hour per day rehabilitation nursing, Therapeutic Recreaction (TR), Psychology, Neuropsychology, Care Coordinator, Rehabilitation Medicine, Nutrition Services, Pharmacy Services, and Other  Weekly team conferences will be held on Tuesday to discuss your progress.  Your Inpatient Rehabilitation Care Coordinator will talk with you frequently to get your input and to update you on team discussions.  Team conferences with you and your family in attendance may also be held.  Expected length of stay: 14-17 days    Overall anticipated outcome: Supervision  Depending on your progress and recovery, your program may change. Your Inpatient Rehabilitation Care Coordinator will coordinate services and will keep you informed of any changes. Your Inpatient Rehabilitation Care Coordinator's name and contact numbers are listed  below.  The following services may also be recommended but are not provided by the Inpatient Rehabilitation Center:  Driving Evaluations Home Health Rehabiltiation Services Outpatient Rehabilitation Services Vocational Rehabilitation   Arrangements will be made to provide these services after discharge if needed.  Arrangements include referral to agencies that provide these services.  Your insurance has been verified to be:  Medicare A/B  Your primary  doctor is:  Layman Piety  Pertinent information will be shared with your doctor and your insurance company.  Inpatient Rehabilitation Care Coordinator:  Graeme Jude, KEN [phone] or [phone]  Information discussed with and copy given to patient by: Graeme DELENA Jude, 12/07/2023, 1:23 PM

## 2023-12-07 NOTE — Plan of Care (Signed)
  Problem: Consults Goal: RH BRAIN INJURY PATIENT EDUCATION Description: Description: See Patient Education module for eduction specifics Outcome: Progressing   Problem: RH BOWEL ELIMINATION Goal: RH STG MANAGE BOWEL WITH ASSISTANCE Description: STG Manage Bowel with mod I Assistance. Outcome: Progressing Goal: RH STG MANAGE BOWEL W/MEDICATION W/ASSISTANCE Description: STG Manage Bowel with Medication with mod I  Assistance. Outcome: Progressing   Problem: RH BLADDER ELIMINATION Goal: RH STG MANAGE BLADDER WITH ASSISTANCE Description: STG Manage Bladder With toileting Assistance Outcome: Progressing   Problem: RH SAFETY Goal: RH STG ADHERE TO SAFETY PRECAUTIONS W/ASSISTANCE/DEVICE Description: STG Adhere to Safety Precautions With cues Assistance/Device. Outcome: Progressing   Problem: RH COGNITION-NURSING Goal: RH STG USES MEMORY AIDS/STRATEGIES W/ASSIST TO PROBLEM SOLVE Description: STG Uses Memory Aids/Strategies With cues Assistance to Problem Solve. Outcome: Progressing   Problem: RH PAIN MANAGEMENT Goal: RH STG PAIN MANAGED AT OR BELOW PT'S PAIN GOAL Description: Pain < 4 with prns Outcome: Progressing   Problem: RH KNOWLEDGE DEFICIT BRAIN INJURY Goal: RH STG INCREASE KNOWLEDGE OF SELF CARE AFTER BRAIN INJURY Description: Patient and spouse will be able to manage care using educational resources for medications and dietary modification, etc, independently Outcome: Progressing

## 2023-12-08 LAB — GLUCOSE, CAPILLARY: Glucose-Capillary: 128 mg/dL — ABNORMAL HIGH (ref 70–99)

## 2023-12-08 MED ORDER — CLOPIDOGREL BISULFATE 75 MG PO TABS
75.0000 mg | ORAL_TABLET | Freq: Every day | ORAL | Status: DC
  Administered 2023-12-08 – 2023-12-19 (×12): 75 mg via ORAL
  Filled 2023-12-08 (×12): qty 1

## 2023-12-08 MED ORDER — SENNOSIDES-DOCUSATE SODIUM 8.6-50 MG PO TABS
1.0000 | ORAL_TABLET | Freq: Two times a day (BID) | ORAL | Status: DC
  Administered 2023-12-08 – 2023-12-12 (×7): 1 via ORAL
  Filled 2023-12-08 (×7): qty 1

## 2023-12-08 MED ORDER — ASPIRIN 81 MG PO TBEC
81.0000 mg | DELAYED_RELEASE_TABLET | Freq: Every day | ORAL | Status: DC
Start: 2023-12-08 — End: 2023-12-19
  Administered 2023-12-08 – 2023-12-19 (×12): 81 mg via ORAL
  Filled 2023-12-08 (×13): qty 1

## 2023-12-08 NOTE — Progress Notes (Signed)
 Physical Therapy Session Note  Patient Details  Name: [name] MRN: [id number] Date of Birth: 05/27/1928  Today's Date: 12/08/2023 PT Individual Time: 9199-9154 PT Individual Time Calculation (min): 45 min   Short Term Goals: Week 1:  PT Short Term Goal 1 (Week 1): Pt will complete bed mobility with minA. PT Short Term Goal 2 (Week 1): Pt will complete sit to stand with minA. PT Short Term Goal 3 (Week 1): Pt will complete bed to chair transfer with minA. PT Short Term Goal 4 (Week 1): Pt will ambulate x100' with minA and LRAD.  Skilled Therapeutic Interventions/Progress Updates:    pt received in bed and agreeable to therapy. Pt reports unrated pain in back and clavicle. Donned sling and pt reports improved pain in sling. Movement and therapy to tolerance.   Donned socks and shoes tot a for time. Sit to stand with min a for balance and anterior weight shift. Gait to day room with no AD, min a overall for balance and up to mod for small LOB.   Pt used nustep x 10 min on rolling hills program load interval 4-11. Cued pt to keep SPM >50. Pt with difficulty with this, but able to maintain 40-45 SPM.   Pt then performed 3 bouts of weaving through 8 cones for dynamic balance and gait. Required up to mod a for x 2 small LOB.Returned to room in same manner and remained up in w/c, was left with all needs in reach and alarm active.   Therapy Documentation Precautions:  Precautions Precautions: Fall Recall of Precautions/Restrictions: Impaired Precaution/Restrictions Comments: Unaware pf NWB RUE Required Braces or Orthoses: Sling Restrictions Weight Bearing Restrictions Per Provider Order: Yes RUE Weight Bearing Per Provider Order: Non weight bearing General:       Therapy/Group: Individual Therapy  Schuyler JAYSON Batter 12/08/2023, 8:25 AM

## 2023-12-08 NOTE — Progress Notes (Signed)
 PROGRESS NOTE   Subjective/Complaints:  No acute complaints. No events ovenright.  Has been having more pain in his low back today as compared to the flanks yesterday, states he did not get aqua thermia overnight.  Does say overall he is better compared to yesterday.  Vitals stable.  Continent b/b; large BM 10/17   ROS: Denies fevers, chills, N/V, abdominal pain, constipation, diarrhea, SOB, cough, chest pain, new weakness or paraesthesias.    + Flank pain--now primarily back pain--improving  Objective:   No results found. Recent Labs    12/06/23 0527  WBC 10.0  HGB 11.5*  HCT 34.0*  PLT 246   Recent Labs    12/06/23 0527  NA 135  K 4.1  CL 100  CO2 23  GLUCOSE 110*  BUN 15  CREATININE 0.80  CALCIUM  8.3*    Intake/Output Summary (Last 24 hours) at 12/08/2023 0953 Last data filed at 12/08/2023 0858 Gross per 24 hour  Intake 360 ml  Output 675 ml  Net -315 ml        Physical Exam: Vital Signs Blood pressure (!) 131/58, pulse 85, temperature 98.3 F (36.8 C), temperature source Oral, resp. rate 18, height 5' 5 (1.651 m), weight 65.6 kg, SpO2 96%.  Constitutional: No apparent distress. Appropriate appearance for age.  Sitting upright in therapy gym. HENT: No JVD. Neck Supple. Trachea midline. Atraumatic, normocephalic.+ HOH Eyes: PERRLA. EOMI. Visual fields grossly intact.  Cardiovascular: RRR, no murmurs/rub/gallops. No Edema. Peripheral pulses 2+  Respiratory: CTAB. No rales, rhonchi, or wheezing. On RA.  Abdomen: + bowel sounds, normoactive. No distention or tenderness.  Skin: Some bruising across right forehead and eye, temple.--Stable + Scraping across right knee, with some punctate bleeding, stable--Mepilex + Well-circumscribed dark lesion on right anterior shin, dry--covered in Mepilex + Skin avulsion of left hand--covered in clean gauze  MSK:      No apparent deformity. + Minimal TTP right  shoulder, in sling.   + TTP bilateral quadratus muscles mild, thoracolumbar junction and lumbar paraspinals moderate  Neurologic exam:  Cognition: AAO to person, place, year and month.  Can get day with cues. Language: Some substitutions, significant difficulty word finding. Memory: Moderate deficits Insight: Fair insight into current condition.  Mood: Pleasant affect, appropriate mood.  Sensation: Equal and intact in BL UE and Les.  Reflexes:Negative Hoffman's and babinski signs bilaterally.  CN: Right ptosis, otherwise grossly intact Coordination: No apparent tremors. No ataxia on FTN, HTS bilaterally.  Spasticity: MAS 0 in all extremities.  Strength: 4-5 left upper and lower extremity, 5-5 right upper shoulder and fingers; 5 out of 5 right lower extremity      Assessment/Plan: 1. Functional deficits which require 3+ hours per day of interdisciplinary therapy in a comprehensive inpatient rehab setting. Physiatrist is providing close team supervision and 24 hour management of active medical problems listed below. Physiatrist and rehab team continue to assess barriers to discharge/monitor patient progress toward functional and medical goals  Care Tool:  Bathing    Body parts bathed by patient: Right arm, Chest, Abdomen, Right upper leg, Left upper leg, Face   Body parts bathed by helper: Left arm, Front perineal area, Buttocks, Right  lower leg, Left lower leg     Bathing assist Assist Level: Maximal Assistance - Patient 24 - 49%     Upper Body Dressing/Undressing Upper body dressing   What is the patient wearing?: Pull over shirt    Upper body assist Assist Level: Moderate Assistance - Patient 50 - 74%    Lower Body Dressing/Undressing Lower body dressing      What is the patient wearing?: Pants     Lower body assist Assist for lower body dressing: Total Assistance - Patient < 25%     Toileting Toileting    Toileting assist Assist for toileting: Total Assistance  - Patient < 25%     Transfers Chair/bed transfer  Transfers assist  Chair/bed transfer activity did not occur: Safety/medical concerns  Chair/bed transfer assist level: Minimal Assistance - Patient > 75%     Locomotion Ambulation   Ambulation assist      Assist level: Moderate Assistance - Patient 50 - 74% Assistive device: Hand held assist Max distance: 160ft   Walk 10 feet activity   Assist     Assist level: Moderate Assistance - Patient - 50 - 74% Assistive device: No Device, Hand held assist   Walk 50 feet activity   Assist Walk 50 feet with 2 turns activity did not occur: Safety/medical concerns  Assist level: Moderate Assistance - Patient - 50 - 74% Assistive device: Hand held assist, No Device    Walk 150 feet activity   Assist Walk 150 feet activity did not occur: Safety/medical concerns         Walk 10 feet on uneven surface  activity   Assist     Assist level: Minimal Assistance - Patient > 75% Assistive device: Other (comment) (Lt Handrail)   Wheelchair     Assist Is the patient using a wheelchair?: Yes Type of Wheelchair: Manual    Wheelchair assist level: Dependent - Patient 0% Max wheelchair distance: 150'    Wheelchair 50 feet with 2 turns activity    Assist        Assist Level: Dependent - Patient 0%   Wheelchair 150 feet activity     Assist      Assist Level: Dependent - Patient 0%   Blood pressure (!) 131/58, pulse 85, temperature 98.3 F (36.8 C), temperature source Oral, resp. rate 18, height 5' 5 (1.651 m), weight 65.6 kg, SpO2 96%.  Medical Problem List and Plan: 1. 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  Conservative care per neurosurgery Dr. Alm Molt.             -patient may  shower             -ELOS/Goals: 14-21 days Supervision to min A             - Stable to continue CIR  2.  Antithrombotics: -DVT/anticoagulation:  Mechanical: Antiembolism  stockings, thigh (TED hose) Bilateral lower extremities             -antiplatelet therapy: HOLD ASPIRIN  AND PLAVIX  X ONE WEEK THEN RESUME (10/17)  3. Pain Management: Oxycodone  as needed, Robaxin 500 mg every 8 hours as needed muscle spasms   - No complaints of pain   - 10-16: Bilateral flank pain likely musculoskeletal; discussed with nursing, add aqua thermia, scheduled Robaxin 500 mg 3 times daily   - 10-17: Pain improved with Robaxin, is having more low back pain to today, discussing with nursing getting aqua therapy in room  4.  Mood/Behavior/Sleep: Provide emotional support             -antipsychotic agents: N/A  - 10-15: Complains of poor sleep, add melatonin 5 mg nightly as needed--scheduled 10-16--improved  5. Neuropsych/cognition: This patient he is capable of making decisions on his own behalf.  6. 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 Fluids/Electrolytes/Nutrition: Routine in and outs with follow-up chemistries   - 10-15: A.m. labs stable, albumin 2.2.  Will get dietary assessment.  8.  Seizure prophylaxis.  Keppra 750 mg twice daily x 7 days total 9.  Nondisplaced right clavicle fracture.  Conservative care per [name] orthopedic surgery.  Nonweightbearing.  Follow-up x-rays 2 weeks (10/23)  10.  Hypertension.  Cardizem 240 mg daily.  Monitor with increased mobility   - Vitals well-controlled on current regimen    12/08/2023    3:46 AM 12/07/2023    7:37 PM 12/07/2023    1:54 PM  Vitals with BMI  Systolic 131 145 873  Diastolic 58 57 59  Pulse 85 93 83    11.  Hyperlipidemia.  Lipitor 12.  Hospital course hiccups.  Thorazine  10 mg every 4 hours as needed   - No hiccups 10-15 on evaluation 13.  History of popliteal aneurysm status post stenting.  Followed by vascular surgery Dr. Selinda Gu.HOLD ASPIRIN  AND PLAVIX  X ONE WEEK THEN RESUME 14.  History of AAA.  Followed  outpatient by Dr. Gu 15.  Constipation.  MiraLAX twice daily, Senokot-S 2 tablets twice daily.   - Large, liquid bowel movement 10-15   - LBM 10/17 - reduce sennakot S to 1 tab BID    LOS: 3 days A FACE TO FACE EVALUATION WAS PERFORMED  Jerry [name] Likes 12/08/2023, 9:53 AM

## 2023-12-08 NOTE — Plan of Care (Signed)
  Problem: Consults Goal: RH BRAIN INJURY PATIENT EDUCATION Description: Description: See Patient Education module for eduction specifics Outcome: Progressing   Problem: RH BOWEL ELIMINATION Goal: RH STG MANAGE BOWEL WITH ASSISTANCE Description: STG Manage Bowel with mod I Assistance. Outcome: Progressing Goal: RH STG MANAGE BOWEL W/MEDICATION W/ASSISTANCE Description: STG Manage Bowel with Medication with mod I  Assistance. Outcome: Progressing   Problem: RH BLADDER ELIMINATION Goal: RH STG MANAGE BLADDER WITH ASSISTANCE Description: STG Manage Bladder With toileting Assistance Outcome: Progressing   Problem: RH SAFETY Goal: RH STG ADHERE TO SAFETY PRECAUTIONS W/ASSISTANCE/DEVICE Description: STG Adhere to Safety Precautions With cues Assistance/Device. Outcome: Progressing   Problem: RH COGNITION-NURSING Goal: RH STG USES MEMORY AIDS/STRATEGIES W/ASSIST TO PROBLEM SOLVE Description: STG Uses Memory Aids/Strategies With cues Assistance to Problem Solve. Outcome: Progressing   Problem: RH PAIN MANAGEMENT Goal: RH STG PAIN MANAGED AT OR BELOW PT'S PAIN GOAL Description: Pain < 4 with prns Outcome: Progressing   Problem: RH KNOWLEDGE DEFICIT BRAIN INJURY Goal: RH STG INCREASE KNOWLEDGE OF SELF CARE AFTER BRAIN INJURY Description: Patient and spouse will be able to manage care using educational resources for medications and dietary modification, etc, independently Outcome: Progressing

## 2023-12-08 NOTE — Progress Notes (Signed)
 Physical Therapy Session Note  Patient Details  Name: Jerry [name] MRN: [id number] Date of Birth: [birth year]  Today's Date: 12/08/2023 PT Individual Time: [phone] PT Individual Time Calculation (min): 64 min   Short Term Goals: Week 1:  PT Short Term Goal 1 (Week 1): Pt will complete bed mobility with minA. PT Short Term Goal 2 (Week 1): Pt will complete sit to stand with minA. PT Short Term Goal 3 (Week 1): Pt will complete bed to chair transfer with minA. PT Short Term Goal 4 (Week 1): Pt will ambulate x100' with minA and LRAD.  Skilled Therapeutic Interventions/Progress Updates:    Pt up in w/c with family at side, agreeable for session.  Pt wheeled to gym to conserve energy.  Pt reports back pain today, but feels better with walking. Pt performed 5TST x2 reps with CGA/S. Gait with L HHA around Nsg station. Rest. Cybex kinetron 10 cm/sec x6' seated in w/c.  Rest. Gait with L HHA around Nsg station. Cueing to lift RLE to avoid shuffling.  Steps are short, slow cadence, minimally unsteady at times. Seated LE ex's: LAQs; with red TB: clams, marching. Pt amb back to room, requesting to use commode.  Pt transferred on/off commode with CGA, amb back to bed with LHHA, CGA into bed. Pt's family at bedside.  Cueing throughout session for RUE precautions with transfers.    Therapy Documentation Precautions:  Precautions Precautions: Fall Recall of Precautions/Restrictions: Impaired Precaution/Restrictions Comments: Unaware pf NWB RUE Required Braces or Orthoses: Sling Restrictions Weight Bearing Restrictions Per Provider Order: Yes RUE Weight Bearing Per Provider Order: Non weight bearing   Pain: no pain     Therapy/Group: Individual Therapy  [name] Fast 12/08/2023, 4:31 PM

## 2023-12-08 NOTE — Progress Notes (Signed)
 Occupational Therapy Session Note  Patient Details  Name: [name] MRN: [id number] Date of Birth: 1928-04-15  Today's Date: 12/08/2023 OT Individual Time: 1105-1200 OT Individual Time Calculation (min): 55 min   Today's Date: 12/08/2023 OT Individual Time: 1420-1500 OT Individual Time Calculation (min): 40 min   Short Term Goals: Week 1:  OT Short Term Goal 1 (Week 1): Pt will perform toilet transfer with Min A + LRAD. OT Short Term Goal 2 (Week 1): Pt will perform 1/3 toileting tasks with Min A for standing balance. OT Short Term Goal 3 (Week 1): Pt will thread LB garments with Min A + LRAD.  Skilled Therapeutic Interventions/Progress Updates:   Session 1: Pt greeted resting in bed for skilled OT session with focus on BADL retraining and functional transfers.   Pain: Pt with no reports of pain, but intermediate grimacing. OT offering intermediate rest breaks and positioning suggestions throughout session to address pain/fatigue and maximize participation/safety in session.   Functional Transfers: Supine>sit EOB with Min A towards L-EOB. Ambulatory bathroom transfers with Min A (L HHA and/or grab bar).   Self Care Tasks: Pt completes the following self care tasks with levels of assistance noted below, UB: Setup A.  LB: Min A for distal LE and buttock (patient assisting with lateral leans). Dressing with CGA-Min A for standing balance during hiking, encouragement provided for independence with threading.  Continent void in toilet.   Pt remained sitting in WC, RN present for wound dressing, 4Ps assessed and immediate needs met. Pt continues to be appropriate for skilled OT intervention to promote further functional independence in ADLs/IADLs.    Session 2:  Pt greeted attempting to transfer for toileting. Skilled OT session with focus on toileting, functional mobility, and general conditioning.   Pain: Pt with intermediate reports of pain B-flank. OT offering intermediate  rest breaks and positioning suggestions throughout session to address pain/fatigue and maximize participation/safety in session.   Functional Transfers: Sit<>stands with CGA + LUE supported. Ambulatory toilet transfer with Min A (L HHA). Ambulation from room<>day room in similar fashion, cuing provided for upright posture/gaze.   Self Care Tasks: Pt completes the following self care tasks with levels of assistance noted below, 3/3 toileting with Mod A for thorough posterior pericare (messy BM) and clothing management on R-side. More than reasonable amount of time to complete tasks.   Therapeutic Exercise: Pt instructed in 4x2 min cycles of Nustep modality, progressive overload of resistance from level 1 to level 4 with intermediate 30 sec rest-breaks, all for general conditioning for carryover into ADL participation and functional transfers.   Pt remained resting in bed with 4Ps assessed and immediate needs met. Pt continues to be appropriate for skilled OT intervention to promote further functional independence in ADLs/IADLs.   Therapy Documentation Precautions:  Precautions Precautions: Fall Recall of Precautions/Restrictions: Impaired Precaution/Restrictions Comments: Unaware pf NWB RUE Required Braces or Orthoses: Sling Restrictions Weight Bearing Restrictions Per Provider Order: Yes RUE Weight Bearing Per Provider Order: Non weight bearing   Therapy/Group: Individual Therapy  Nereida Habermann, OTR/L, MSOT  12/08/2023, 7:57 AM

## 2023-12-08 NOTE — IPOC Note (Signed)
 Overall Plan of Care [facility]) Patient Details Name: Jerry [name] MRN: [id number] DOB: 05-18-1928  Admitting Diagnosis: Traumatic subdural hematoma (SDH) [facility])  Hospital Problems: Principal Problem:   Traumatic subdural hematoma (SDH) (HCC) Active Problems:   Traumatic subdural hematoma (HCC)   Malnutrition of moderate degree     Functional Problem List: Nursing Bladder, Safety, Bowel, Endurance, Medication Management, Pain  PT Balance, Endurance, Motor, Pain, Safety  OT Balance, Endurance, Pain, Safety, Skin Integrity  SLP  (n/a skillled ST not warranted)  TR         Basic ADL's: OT Bathing, Dressing, Toileting     Advanced  ADL's: OT       Transfers: PT Bed Mobility, Bed to Chair, Car, Occupational psychologist, Research scientist (life sciences): PT Ambulation, Stairs     Additional Impairments: OT    SLP        TR      Anticipated Outcomes Item Anticipated Outcome  Self Feeding    Swallowing      Basic self-care  Marketing executive Transfers Supervision  Bowel/Bladder  manage bowel w mod I and bladder w toileting  Transfers  Supervision  Locomotion  Supervision  Communication     Cognition     Pain  Pain < 4 with prns  Safety/Judgment  manage safety w cues   Therapy Plan: PT Intensity: Minimum of 1-2 x/day ,45 to 90 minutes PT Frequency: 5 out of 7 days PT Duration Estimated Length of Stay: 14-17 Days OT Intensity: Minimum of 1-2 x/day, 45 to 90 minutes OT Frequency: 5 out of 7 days OT Duration/Estimated Length of Stay: 14-17 days SLP Intensity:  (n/a skillled ST not warranted) SLP Frequency:  (n/a skillled ST not warranted) SLP Duration/Estimated Length of Stay: n/a skillled ST not warranted   Team Interventions: Nursing Interventions Patient/Family Education, Pain Management, Medication Management, Bladder Management, Discharge Planning, Bowel Management, Disease Management/Prevention, Cognitive  Remediation/Compensation  PT interventions Ambulation/gait training, Community reintegration, DME/adaptive equipment instruction, Neuromuscular re-education, Psychosocial support, Stair training, UE/LE Strength taining/ROM, Warden/ranger, Discharge planning, Functional electrical stimulation, Pain management, Skin care/wound management, Therapeutic Activities, UE/LE Coordination activities, Cognitive remediation/compensation, Disease management/prevention, Functional mobility training, Patient/family education, Splinting/orthotics, Therapeutic Exercise, Visual/perceptual remediation/compensation  OT Interventions Warden/ranger, Community reintegration, Discharge planning, Disease mangement/prevention, DME/adaptive equipment instruction, Functional mobility training, Neuromuscular re-education, Pain management, Patient/family education, Psychosocial support, Self Care/advanced ADL retraining, Skin care/wound managment, Therapeutic Activities, Therapeutic Exercise, UE/LE Strength taining/ROM, Wheelchair propulsion/positioning  SLP Interventions  (n/a skillled ST not warranted)  TR Interventions    SW/CM Interventions Discharge Planning, Psychosocial Support, Patient/Family Education   Barriers to Discharge MD  Medical stability, Home enviroment access/loayout, and Lack of/limited family support, Weightbearing restrictions  Nursing Decreased caregiver support, Weight bearing restrictions, Home environment access/layout 2 level main B+B 6 ste bil rails  PT      OT Home environment access/layout, Incontinence, Wound Care, Lack of/limited family support, Weight bearing restrictions    SLP  (n/a skillled ST not warranted)    SW Decreased caregiver support, Lack of/limited family support     Team Discharge Planning: Destination: PT-Home ,OT- Home , SLP-Home Projected Follow-up: PT-Home health PT, 24 hour supervision/assistance, OT-  Home health OT, SLP-None Projected  Equipment Needs: PT-To be determined, OT- To be determined, SLP-None recommended by SLP Equipment Details: PT- , OT-  Patient/family involved in discharge planning: PT- Patient, Family member/caregiver,  OT-Patient, Family member/caregiver, SLP-Patient, Family member/caregiver  MD ELOS:  14-21 days Medical Rehab Prognosis:  Good Assessment: The patient has been admitted for CIR therapies with the diagnosis of R SDH. The team will be addressing functional mobility, strength, stamina, balance, safety, adaptive techniques and equipment, self-care, bowel and bladder mgt, patient and caregiver education,  . Goals have been set at [employer] [facility] PT, OT. Anticipated discharge destination is home.       See Team Conference Notes for weekly updates to the plan of care

## 2023-12-09 DIAGNOSIS — I10 Essential (primary) hypertension: Secondary | ICD-10-CM

## 2023-12-09 DIAGNOSIS — N390 Urinary tract infection, site not specified: Secondary | ICD-10-CM

## 2023-12-09 DIAGNOSIS — S065X9D Traumatic subdural hemorrhage with loss of consciousness of unspecified duration, subsequent encounter: Secondary | ICD-10-CM

## 2023-12-09 DIAGNOSIS — A499 Bacterial infection, unspecified: Secondary | ICD-10-CM

## 2023-12-09 LAB — GLUCOSE, CAPILLARY: Glucose-Capillary: 122 mg/dL — ABNORMAL HIGH (ref 70–99)

## 2023-12-09 LAB — URINALYSIS, W/ REFLEX TO CULTURE (INFECTION SUSPECTED)
Bilirubin Urine: NEGATIVE
Glucose, UA: NEGATIVE mg/dL
Ketones, ur: NEGATIVE mg/dL
Nitrite: POSITIVE — AB
Protein, ur: 100 mg/dL — AB
RBC / HPF: >50 RBC/hpf (ref 0–5)
Specific Gravity, Urine: 1.018 (ref 1.005–1.030)
WBC, UA: >50 WBC/hpf (ref 0–5)
pH: 5 (ref 5.0–8.0)

## 2023-12-09 MED ORDER — CEPHALEXIN 250 MG PO CAPS
500.0000 mg | ORAL_CAPSULE | Freq: Two times a day (BID) | ORAL | Status: AC
  Administered 2023-12-09 – 2023-12-14 (×10): 500 mg via ORAL
  Filled 2023-12-09 (×10): qty 2

## 2023-12-09 NOTE — Plan of Care (Signed)
  Problem: RH BOWEL ELIMINATION Goal: RH STG MANAGE BOWEL WITH ASSISTANCE Description: STG Manage Bowel with mod I Assistance. Outcome: Progressing Goal: RH STG MANAGE BOWEL W/MEDICATION W/ASSISTANCE Description: STG Manage Bowel with Medication with mod I Assistance. Outcome: Progressing   Problem: RH BLADDER ELIMINATION Goal: RH STG MANAGE BLADDER WITH ASSISTANCE Description: STG Manage Bladder With toileting Assistance Outcome: Progressing   Problem: RH SAFETY Goal: RH STG ADHERE TO SAFETY PRECAUTIONS W/ASSISTANCE/DEVICE Description: STG Adhere to Safety Precautions With cues Assistance/Device. Outcome: Progressing

## 2023-12-09 NOTE — Progress Notes (Signed)
 Occupational Therapy Session Note  Patient Details  Name: Jerry [name] MRN: [id number] Date of Birth: 1929-01-30  Today's Date: 12/09/2023 OT Individual Time: 9294-9187 OT Individual Time Calculation (min): 67 min   Today's Date: 12/09/2023 OT Individual Time: 8678-8575 OT Individual Time Calculation (min): 63 min   Short Term Goals: Week 1:  OT Short Term Goal 1 (Week 1): Pt will perform toilet transfer with Min A + LRAD. OT Short Term Goal 2 (Week 1): Pt will perform 1/3 toileting tasks with Min A for standing balance. OT Short Term Goal 3 (Week 1): Pt will thread LB garments with Min A + LRAD.  Skilled Therapeutic Interventions/Progress Updates:   Session 1: Pt greeted resting in bed for skilled OT session with focus on BADL retraining and functional transfers/mobility.   Pain: Pt with un-rated stomach pain, reporting increased urgency overnight. OT offering intermediate rest breaks and positioning suggestions throughout session to address pain/fatigue and maximize participation/safety in session.   Functional Transfers: Bed mobility with Min A for WB precaution adherence. Sit<>stands with Min A and cuing for forward weight-shift, ambulating into/out of bathroom with Min A (L HHA). Household level mobility from room<>day room in similar fashion, cues provided for upright posture/gaze and R stride length.   Self Care Tasks: Pt completes the following self care tasks with levels of assistance noted below, UB: Dressing with setup A.  LB: Threading of LB garments with Min A, standing hike with Min A for standing balance. 3/3 toileting tasks with A for standing balance as noted above.  Sink-side grooming with OT providing assistance for thorough shaving and oral care.   Pt remained sitting in WC with 4Ps assessed and immediate needs met. Pt continues to be appropriate for skilled OT intervention to promote further functional independence in ADLs/IADLs.   Session 2: Pt greeted  resting in bed for skilled OT session with focus on functional mobility, dynamic standing balance, and activity tolerance.   Pain: Pt reported 4/10 R-flank pain, medicated during session. OT offering intermediate rest breaks and positioning suggestions throughout session to address pain/fatigue and maximize participation/safety in session.   Functional Transfers: Bed mobility with Min A. Sit<>stands with CGA-Min A, household ambulation with Min A (L HHA).   Self Care Tasks: 3/3 toileting tasks with Min A for garment management.   Therapeutic Activities: Pt instructed in the activities below for BLE strengthening, dynamic standing balance, and decreasing fall risk, details below: 3x10 alternating toes taps onto 4 step x3 reps of agility ladder, moderate cuing for R-step length/stride X2 reps of agility ladder with intermediate lateral stepping onto cones  Pt completes with Min A (L HHA) no overt LOB noted, although patient does tend to veer towards R during unilateral stance.   Therapeutic Exercise: Pt completes 2x10 reps of the exercises below for promotion of R-shoulder ROM (within precautions due to fx), details below: Flexion/extension Internal/external rotation (external ROM decreased due to pain) Circumduction Forearm pronation/supination  Elbow flexion/extension   UE ranger utilized for shoulder exercises. Pt tolerates use of 1# DB for forearm/elbow exercises.    Pt remained resting in bed with 4Ps assessed and immediate needs met. Pt continues to be appropriate for skilled OT intervention to promote further functional independence in ADLs/IADLs.   Therapy Documentation Precautions:  Precautions Precautions: Fall Recall of Precautions/Restrictions: Impaired Precaution/Restrictions Comments: Unaware pf NWB RUE Required Braces or Orthoses: Sling Restrictions Weight Bearing Restrictions Per Provider Order: Yes RUE Weight Bearing Per Provider Order: Non weight  bearing  Therapy/Group: Individual Therapy  Nereida Habermann, OTR/L, MSOT  12/09/2023, 7:01 AM

## 2023-12-09 NOTE — Progress Notes (Signed)
 Physical Therapy Session Note  Patient Details  Name: Jerry [name] MRN: [id number] Date of Birth: 16-Jun-1928  Today's Date: 12/09/2023 PT Individual Time: 0854-      Short Term Goals: Week 1:  PT Short Term Goal 1 (Week 1): Pt will complete bed mobility with minA. PT Short Term Goal 2 (Week 1): Pt will complete sit to stand with minA. PT Short Term Goal 3 (Week 1): Pt will complete bed to chair transfer with minA. PT Short Term Goal 4 (Week 1): Pt will ambulate x100' with minA and LRAD.  Skilled Therapeutic Interventions/Progress Updates:    Pt sitting in w/c with RUE sling donned,  family in room when PT arrived.  Pt agreeable for session, states he's not doing well and appears a bit melancholy.  Pt wheeled to day gym.  NuStep level 3 LUE, BLE focusing on steady pace, endurance Pt coughing during session c/o pain.  Nsg notified and came to gym for med pass. Pt feels he has a UTI, was urinating every 15 minutes last night, Nsg aware.  Pt wheeled to main gym for stair negotiation. Pt states he needed to urinate, pt wheeled back to room to use toilet with CGA, total A to doff pants/brief.  Urine collected, Nsg notified. Pt wheeled back to main gym.  Pt able to perform x12 steps (4-6; 8-3) x1 HR, step to pattern up/down, steady. Pt fatigued post activity, wheeled back to room and pt requesting to get into bed.  Pt requires max A for LE/trunk assist due to fatigue.  Family present in room.    Therapy Documentation Precautions:  Precautions Precautions: Fall Recall of Precautions/Restrictions: Impaired Precaution/Restrictions Comments: Unaware pf NWB RUE Required Braces or Orthoses: Sling Restrictions Weight Bearing Restrictions Per Provider Order: Yes RUE Weight Bearing Per Provider Order: Non weight bearing    Pain: 8/10 to right lower ribs when coughing       Therapy/Group: Individual Therapy  [name] Fast 12/09/2023, 9:07 AM

## 2023-12-09 NOTE — Progress Notes (Signed)
 PROGRESS NOTE   Subjective/Complaints:  Pt with urinary frequency and dysuria overnight. Nurse apparently collected a specimen. Otherwise patient doing fairly well.   ROS: Patient denies fever, rash, sore throat, blurred vision, dizziness, nausea, vomiting, diarrhea, cough, shortness of breath or chest pain,  headache, or mood change.   Objective:   No results found. No results for input(s): WBC, HGB, HCT, PLT in the last 72 hours.  No results for input(s): NA, K, CL, CO2, GLUCOSE, BUN, CREATININE, CALCIUM  in the last 72 hours.   Intake/Output Summary (Last 24 hours) at 12/09/2023 1615 Last data filed at 12/09/2023 1204 Gross per 24 hour  Intake 230 ml  Output 875 ml  Net -645 ml        Physical Exam: Vital Signs Blood pressure 119/63, pulse 85, temperature 97.6 F (36.4 C), resp. rate 16, height 5' 5 (1.651 m), weight 65.6 kg, SpO2 97%.  Constitutional: No distress . Vital signs reviewed. HEENT: bruising on face, EOMI, oral membranes moist Neck: supple Cardiovascular: RRR without murmur. No JVD    Respiratory/Chest: CTA Bilaterally without wheezes or rales. Normal effort    GI/Abdomen: BS +, non-tender, non-distended Ext: no clubbing, cyanosis, or edema Psych: pleasant and cooperative   Skin: Some bruising across right forehead and eye, temple.--Stable + Scraping across right knee, with some punctate bleeding, stable--Mepilex + Well-circumscribed dark lesion on right anterior shin, dry--covered in Mepilex + Skin avulsion of left hand--covered in clean gauze  MSK:      No apparent deformity. + Minimal TTP right shoulder remains in sling.   + TTP bilateral quadratus muscles mild, thoracolumbar junction and lumbar paraspinals moderate  Neurologic exam:  Cognition: AAO to person, place, year and month. Remembered it was saturday Language: Some substitutions, significant difficulty word  finding. Memory: Moderate deficits Insight: Fair insight into current condition.  Mood: Pleasant affect, appropriate mood.  Sensation: Equal and intact in BL UE and Les.  Reflexes:Negative Hoffman's and babinski signs bilaterally.  CN: Right ptosis, otherwise grossly intact Coordination: No apparent tremors. No ataxia on FTN, HTS bilaterally.  Spasticity: MAS 0 in all extremities.  Strength: 4-5 left upper and lower extremity, 5-5 right upper shoulder and fingers; 5 out of 5 right lower extremity. Good sitting balance      Assessment/Plan: 1. Functional deficits which require 3+ hours per day of interdisciplinary therapy in a comprehensive inpatient rehab setting. Physiatrist is providing close team supervision and 24 hour management of active medical problems listed below. Physiatrist and rehab team continue to assess barriers to discharge/monitor patient progress toward functional and medical goals  Care Tool:  Bathing    Body parts bathed by patient: Right arm, Chest, Abdomen, Right upper leg, Left upper leg, Face, Left arm, Front perineal area   Body parts bathed by helper: Right lower leg, Left lower leg, Buttocks     Bathing assist Assist Level: Minimal Assistance - Patient > 75%     Upper Body Dressing/Undressing Upper body dressing   What is the patient wearing?: Pull over shirt    Upper body assist Assist Level: Minimal Assistance - Patient > 75%    Lower Body Dressing/Undressing Lower body dressing  What is the patient wearing?: Pants, Incontinence brief     Lower body assist Assist for lower body dressing: Minimal Assistance - Patient > 75%     Toileting Toileting    Toileting assist Assist for toileting: Minimal Assistance - Patient > 75% (For standing balance)     Transfers Chair/bed transfer  Transfers assist  Chair/bed transfer activity did not occur: Safety/medical concerns  Chair/bed transfer assist level: Contact Guard/Touching assist      Locomotion Ambulation   Ambulation assist      Assist level: Minimal Assistance - Patient > 75% Assistive device: Hand held assist Max distance: 12'   Walk 10 feet activity   Assist     Assist level: Minimal Assistance - Patient > 75% Assistive device: No Device, Hand held assist   Walk 50 feet activity   Assist Walk 50 feet with 2 turns activity did not occur: Safety/medical concerns  Assist level: Minimal Assistance - Patient > 75% Assistive device: Hand held assist, No Device    Walk 150 feet activity   Assist Walk 150 feet activity did not occur: Safety/medical concerns  Assist level: Minimal Assistance - Patient > 75% Assistive device: Hand held assist    Walk 10 feet on uneven surface  activity   Assist     Assist level: Minimal Assistance - Patient > 75% Assistive device: Other (comment) (Lt Handrail)   Wheelchair     Assist Is the patient using a wheelchair?: Yes Type of Wheelchair: Manual    Wheelchair assist level: Dependent - Patient 0% Max wheelchair distance: 150'    Wheelchair 50 feet with 2 turns activity    Assist        Assist Level: Dependent - Patient 0%   Wheelchair 150 feet activity     Assist      Assist Level: Dependent - Patient 0%   Blood pressure 119/63, pulse 85, temperature 97.6 F (36.4 C), resp. rate 16, height 5' 5 (1.651 m), weight 65.6 kg, SpO2 97%.  Medical Problem List and Plan: 1. 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  Conservative care per neurosurgery Dr. Alm Molt.             -patient may  shower             -ELOS/Goals: 14-21 days Supervision to min A             -Continue CIR therapies including PT, OT, and SLP   2.  Antithrombotics: -DVT/anticoagulation:  Mechanical: Antiembolism stockings, thigh (TED hose) Bilateral lower extremities             -antiplatelet therapy: HOLD ASPIRIN  AND PLAVIX  X ONE WEEK THEN RESUME (10/17)  3. Pain  Management: Oxycodone  as needed, Robaxin 500 mg every 8 hours as needed muscle spasms   - No complaints of pain   - 10-16: Bilateral flank pain likely musculoskeletal; discussed with nursing, add aqua thermia, scheduled Robaxin 500 mg 3 times daily   - 10-17: Pain improved with Robaxin, is having more low back pain to today, discussing with nursing getting aqua therapy in room  4. Mood/Behavior/Sleep: Provide emotional support             -antipsychotic agents: N/A  - 10-15: Complains of poor sleep, add melatonin 5 mg nightly as needed--scheduled 10-16--improved  5. Neuropsych/cognition: This patient he is capable of making decisions on his own behalf.  6. 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 Fluids/Electrolytes/Nutrition: Routine in and outs with follow-up chemistries   - 10-15: A.m. labs stable, albumin 2.2.  Will get dietary assessment.  8.  Seizure prophylaxis.  Keppra 750 mg twice daily x 7 days total 9.  Nondisplaced right clavicle fracture.  Conservative care per [name] orthopedic surgery.  Nonweightbearing.  Follow-up x-rays 2 weeks (10/23)  10.  Hypertension.  Cardizem 240 mg daily.  Monitor with increased mobility   - Vitals well-controlled on current regimen    12/09/2023    2:35 PM 12/09/2023    3:32 AM 12/08/2023    7:42 PM  Vitals with BMI  Systolic 119 140 882  Diastolic 63 68 62  Pulse 85 87 82    11.  Hyperlipidemia.  Lipitor 12.  Hospital course hiccups.  Thorazine  10 mg every 4 hours as needed   - No hiccups 10-15 on evaluation 13.  History of popliteal aneurysm status post stenting.  Followed by vascular surgery Dr. Selinda Gu.HOLD ASPIRIN  AND PLAVIX  X ONE WEEK THEN RESUME 14.  History of AAA.  Followed outpatient by Dr. Gu 15.  Constipation.  MiraLAX twice daily, Senokot-S 2 tablets twice daily.   - Large, liquid bowel movement 10-15   - LBM 10/17 -  reduced sennakot S to 1 tab BID  16. Urinary frequency/dysuria:  -UA is +  -begin empiric keflex 500mg  bid    LOS: 4 days A FACE TO FACE EVALUATION WAS PERFORMED  Jerry [name] 12/09/2023, 4:15 PM

## 2023-12-10 NOTE — Plan of Care (Signed)
  Problem: RH SAFETY Goal: RH STG ADHERE TO SAFETY PRECAUTIONS W/ASSISTANCE/DEVICE Description: STG Adhere to Safety Precautions With cues Assistance/Device. Outcome: Progressing   Problem: RH BLADDER ELIMINATION Goal: RH STG MANAGE BLADDER WITH ASSISTANCE Description: STG Manage Bladder With toileting Assistance Outcome: Not Progressing   Problem: RH PAIN MANAGEMENT Goal: RH STG PAIN MANAGED AT OR BELOW PT'S PAIN GOAL Description: Pain < 4 with prns Outcome: Not Progressing

## 2023-12-10 NOTE — Plan of Care (Signed)
  Problem: Consults Goal: RH BRAIN INJURY PATIENT EDUCATION Description: Description: See Patient Education module for eduction specifics Outcome: Progressing   Problem: RH BOWEL ELIMINATION Goal: RH STG MANAGE BOWEL WITH ASSISTANCE Description: STG Manage Bowel with mod I Assistance. Outcome: Progressing Goal: RH STG MANAGE BOWEL W/MEDICATION W/ASSISTANCE Description: STG Manage Bowel with Medication with mod I  Assistance. Outcome: Progressing   Problem: RH BLADDER ELIMINATION Goal: RH STG MANAGE BLADDER WITH ASSISTANCE Description: STG Manage Bladder With toileting Assistance Outcome: Progressing   Problem: RH SAFETY Goal: RH STG ADHERE TO SAFETY PRECAUTIONS W/ASSISTANCE/DEVICE Description: STG Adhere to Safety Precautions With cues Assistance/Device. Outcome: Progressing   Problem: RH COGNITION-NURSING Goal: RH STG USES MEMORY AIDS/STRATEGIES W/ASSIST TO PROBLEM SOLVE Description: STG Uses Memory Aids/Strategies With cues Assistance to Problem Solve. Outcome: Progressing   Problem: RH PAIN MANAGEMENT Goal: RH STG PAIN MANAGED AT OR BELOW PT'S PAIN GOAL Description: Pain < 4 with prns Outcome: Progressing   Problem: RH KNOWLEDGE DEFICIT BRAIN INJURY Goal: RH STG INCREASE KNOWLEDGE OF SELF CARE AFTER BRAIN INJURY Description: Patient and spouse will be able to manage care using educational resources for medications and dietary modification, etc, independently Outcome: Progressing

## 2023-12-10 NOTE — Progress Notes (Signed)
 PROGRESS NOTE   Subjective/Complaints:  Still with frequency and dysuria, perhaps a little better this morning. Has received 2 doses of keflex so far. Otherwise doing fairly well  ROS: Patient denies fever, rash, sore throat, blurred vision, dizziness, nausea, vomiting, diarrhea, cough, shortness of breath or chest pain, joint or back/neck pain, headache, or mood change.   Objective:   No results found. No results for input(s): WBC, HGB, HCT, PLT in the last 72 hours.  No results for input(s): NA, K, CL, CO2, GLUCOSE, BUN, CREATININE, CALCIUM  in the last 72 hours.   Intake/Output Summary (Last 24 hours) at 12/10/2023 1014 Last data filed at 12/10/2023 0900 Gross per 24 hour  Intake --  Output 490 ml  Net -490 ml        Physical Exam: Vital Signs Blood pressure 130/73, pulse 76, temperature 97.6 F (36.4 C), temperature source Oral, resp. rate 18, height 5' 5 (1.651 m), weight 65.6 kg, SpO2 95%.  Constitutional: No distress . Vital signs reviewed. HEENT:  Bruising around right face/eye EOMI, oral membranes moist Neck: supple Cardiovascular: RRR without murmur. No JVD    Respiratory/Chest: CTA Bilaterally without wheezes or rales. Normal effort    GI/Abdomen: BS +, non-tender, non-distended Ext: no clubbing, cyanosis, or edema Psych: pleasant and cooperative  Skin: Some bruising across right forehead and eye, temple.--Stable + Scraping across right knee, with some punctate bleeding, stable--Mepilex + Well-circumscribed dark lesion on right anterior shin, dry--covered in Mepilex + Skin avulsion of left hand--remains covered in clean gauze  MSK:      No apparent deformity. + Minimal TTP right shoulder remains in sling.   + TTP bilateral quadratus muscles mild, thoracolumbar junction and lumbar paraspinals moderate  Neurologic exam:  Cognition: AAO to person, place, year and month.  HOH Language: Some substitutions, significant difficulty word finding. Memory: Moderate deficits. Recalls biographical information.  Insight: Fair insight into current condition.  Mood: Pleasant affect, appropriate mood.  Sensation: Equal and intact in BL UE and Les.  Reflexes:Negative Hoffman's and babinski signs bilaterally.  CN: Right ptosis, otherwise grossly intact Coordination: No apparent tremors. No ataxia on FTN, HTS bilaterally.  Spasticity: MAS 0 in all extremities.  Strength: 4-5 left upper and lower extremity, 5-5 right upper shoulder and fingers; 5 out of 5 right lower extremity. Good sitting balance      Assessment/Plan: 1. Functional deficits which require 3+ hours per day of interdisciplinary therapy in a comprehensive inpatient rehab setting. Physiatrist is providing close team supervision and 24 hour management of active medical problems listed below. Physiatrist and rehab team continue to assess barriers to discharge/monitor patient progress toward functional and medical goals  Care Tool:  Bathing    Body parts bathed by patient: Right arm, Chest, Abdomen, Right upper leg, Left upper leg, Face, Left arm, Front perineal area   Body parts bathed by helper: Right lower leg, Left lower leg, Buttocks     Bathing assist Assist Level: Minimal Assistance - Patient > 75%     Upper Body Dressing/Undressing Upper body dressing   What is the patient wearing?: Pull over shirt    Upper body assist Assist Level: Minimal Assistance - Patient >  75%    Lower Body Dressing/Undressing Lower body dressing      What is the patient wearing?: Pants, Incontinence brief     Lower body assist Assist for lower body dressing: Minimal Assistance - Patient > 75%     Toileting Toileting    Toileting assist Assist for toileting: Minimal Assistance - Patient > 75% (For standing balance)     Transfers Chair/bed transfer  Transfers assist  Chair/bed transfer activity did not  occur: Safety/medical concerns  Chair/bed transfer assist level: Contact Guard/Touching assist     Locomotion Ambulation   Ambulation assist      Assist level: Minimal Assistance - Patient > 75% Assistive device: Hand held assist Max distance: 12'   Walk 10 feet activity   Assist     Assist level: Minimal Assistance - Patient > 75% Assistive device: No Device, Hand held assist   Walk 50 feet activity   Assist Walk 50 feet with 2 turns activity did not occur: Safety/medical concerns  Assist level: Minimal Assistance - Patient > 75% Assistive device: Hand held assist, No Device    Walk 150 feet activity   Assist Walk 150 feet activity did not occur: Safety/medical concerns  Assist level: Minimal Assistance - Patient > 75% Assistive device: Hand held assist    Walk 10 feet on uneven surface  activity   Assist     Assist level: Minimal Assistance - Patient > 75% Assistive device: Other (comment) (Lt Handrail)   Wheelchair     Assist Is the patient using a wheelchair?: Yes Type of Wheelchair: Manual    Wheelchair assist level: Dependent - Patient 0% Max wheelchair distance: 150'    Wheelchair 50 feet with 2 turns activity    Assist        Assist Level: Dependent - Patient 0%   Wheelchair 150 feet activity     Assist      Assist Level: Dependent - Patient 0%   Blood pressure 130/73, pulse 76, temperature 97.6 F (36.4 C), temperature source Oral, resp. rate 18, height 5' 5 (1.651 m), weight 65.6 kg, SpO2 95%.  Medical Problem List and Plan: 1. 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  Conservative care per neurosurgery Dr. Alm Molt.             -patient may  shower             -ELOS/Goals: 14-21 days Supervision to min A             -Continue CIR therapies including PT, OT, and SLP    2.  Antithrombotics: -DVT/anticoagulation:  Mechanical: Antiembolism stockings, thigh (TED hose)  Bilateral lower extremities             -antiplatelet therapy:  ASPIRIN  AND PLAVIX  resumed 10/17  3. Pain Management: Oxycodone  as needed, Robaxin 500 mg every 8 hours as needed muscle spasms   - No complaints of pain   - 10-16: Bilateral flank pain likely musculoskeletal; discussed with nursing, add aqua thermia, scheduled Robaxin 500 mg 3 times daily   - 10-17: Pain improved with Robaxin, is having more low back pain to today, discussing with nursing getting aqua therapy in room  4. Mood/Behavior/Sleep: Provide emotional support             -antipsychotic agents: N/A  - 10-15: Complains of poor sleep, add melatonin 5 mg nightly as needed--scheduled 10-16--improved  5. Neuropsych/cognition: This patient he is capable of making decisions on his own  behalf.  6. 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 Fluids/Electrolytes/Nutrition: Routine in and outs with follow-up chemistries   - 10-15: A.m. labs stable, albumin 2.2.  Will get dietary assessment.  8.  Seizure prophylaxis.  Keppra 750 mg twice daily x 7 days total 9.  Nondisplaced right clavicle fracture.  Conservative care per [name] orthopedic surgery.  Nonweightbearing.  Follow-up x-rays 2 weeks (10/23)  10.  Hypertension.  Cardizem 240 mg daily.  Monitor with increased mobility   - Vitals well-controlled on current regimen    12/10/2023    4:15 AM 12/09/2023    7:36 PM 12/09/2023    2:35 PM  Vitals with BMI  Systolic 130 120 880  Diastolic 73 52 63  Pulse 76 88 85    11.  Hyperlipidemia.  Lipitor 12.  Hospital course hiccups.  Thorazine  10 mg every 4 hours as needed   - No hiccups 10-15 on evaluation 13.  History of popliteal aneurysm status post stenting.  Followed by vascular surgery Dr. Selinda Gu.HOLD ASPIRIN  AND PLAVIX  X ONE WEEK THEN RESUME 14.  History of AAA.  Followed outpatient by Dr. Gu 15.  Constipation.  MiraLAX  twice daily, Senokot-S 2 tablets twice daily.   - Large, liquid bowel movement 10-15   - LBM 10/17 - reduced sennakot S to 1 tab BID  16. Urinary frequency/dysuria:  -UA is +, UCX with 100k GNR  -continue empiric keflex 500mg  bid  -encouraged pt to continue drinking despite frequency    LOS: 5 days A FACE TO FACE EVALUATION WAS PERFORMED  Jerry [name] 12/10/2023, 10:14 AM

## 2023-12-11 LAB — BASIC METABOLIC PANEL WITH GFR
Anion gap: 12 (ref 5–15)
BUN: 22 mg/dL (ref 8–23)
CO2: 22 mmol/L (ref 22–32)
Calcium: 8.7 mg/dL — ABNORMAL LOW (ref 8.9–10.3)
Chloride: 100 mmol/L (ref 98–111)
Creatinine, Ser: 0.94 mg/dL (ref 0.61–1.24)
GFR, Estimated: >60 mL/min (ref >60)
Glucose, Bld: 115 mg/dL — ABNORMAL HIGH (ref 70–99)
Potassium: 4.3 mmol/L (ref 3.5–5.1)
Sodium: 134 mmol/L — ABNORMAL LOW (ref 135–145)

## 2023-12-11 LAB — CBC
HCT: 37.9 % — ABNORMAL LOW (ref 39.0–52.0)
Hemoglobin: 12.5 g/dL — ABNORMAL LOW (ref 13.0–17.0)
MCH: 31.1 pg (ref 26.0–34.0)
MCHC: 33 g/dL (ref 30.0–36.0)
MCV: 94.3 fL (ref 80.0–100.0)
Platelets: 396 K/uL (ref 150–400)
RBC: 4.02 MIL/uL — ABNORMAL LOW (ref 4.22–5.81)
RDW: 13.4 % (ref 11.5–15.5)
WBC: 10.9 K/uL — ABNORMAL HIGH (ref 4.0–10.5)
nRBC: 0 % (ref 0.0–0.2)

## 2023-12-11 LAB — URINE CULTURE: Culture: >=100000 — AB

## 2023-12-11 MED ORDER — TOPIRAMATE 25 MG PO TABS
25.0000 mg | ORAL_TABLET | Freq: Every day | ORAL | Status: DC
  Administered 2023-12-11 – 2023-12-15 (×5): 25 mg via ORAL
  Filled 2023-12-11 (×5): qty 1

## 2023-12-11 MED ORDER — TRAZODONE HCL 50 MG PO TABS
50.0000 mg | ORAL_TABLET | Freq: Every day | ORAL | Status: DC
  Administered 2023-12-11 – 2023-12-18 (×8): 50 mg via ORAL
  Filled 2023-12-11 (×8): qty 1

## 2023-12-11 MED ORDER — ACETAMINOPHEN 500 MG PO TABS
1000.0000 mg | ORAL_TABLET | Freq: Three times a day (TID) | ORAL | Status: DC
  Administered 2023-12-11 – 2023-12-19 (×24): 1000 mg via ORAL
  Filled 2023-12-11 (×24): qty 2

## 2023-12-11 NOTE — Plan of Care (Signed)
  Problem: RH BLADDER ELIMINATION Goal: RH STG MANAGE BLADDER WITH ASSISTANCE Description: STG Manage Bladder With toileting Assistance Outcome: Progressing   Problem: RH SAFETY Goal: RH STG ADHERE TO SAFETY PRECAUTIONS W/ASSISTANCE/DEVICE Description: STG Adhere to Safety Precautions With cues Assistance/Device. Outcome: Progressing   Problem: RH PAIN MANAGEMENT Goal: RH STG PAIN MANAGED AT OR BELOW PT'S PAIN GOAL Description: Pain < 4 with prns  Outcome: Progressing

## 2023-12-11 NOTE — Progress Notes (Signed)
 Physical Therapy Session Note  Patient Details  Name: Jerry [name] MRN: [id number] Date of Birth: August 16, 1928  Today's Date: 12/11/2023 PT Individual Time: 9164-9052 PT Individual Time Calculation (min): 72 min   Short Term Goals: Week 1:  PT Short Term Goal 1 (Week 1): Pt will complete bed mobility with minA. PT Short Term Goal 2 (Week 1): Pt will complete sit to stand with minA. PT Short Term Goal 3 (Week 1): Pt will complete bed to chair transfer with minA. PT Short Term Goal 4 (Week 1): Pt will ambulate x100' with minA and LRAD.  Skilled Therapeutic Interventions/Progress Updates:    Pt presents in room in bed, agreeable to PT. Pt reporting R sided headache that pt states comes and goes. Session focused on therapeutic activities to facilitate improved mechanics and safety with self care tasks and transfers as well as activity tolerance, gait training with SPC, and NMR for dynamic standing balance, BLE coordination and muscle fiber recruitment needed for gait and transfers. Pt completes bed mobility with min assist, cues for RUE NWB, requires mod assist for donning pants seated EOB. Pt ambulates to/from bathroom with min assist L HHA, cues for RUE NWB with transfers as pt tends to use despite cues. Pt continent of bladder, charted. Pt ambulates back to [facility] and pt transported to day room dependently for time management and energy conservation. Pt ambulates 180' with SPC in LUE with min assist, ambulates slowly with decreased stride length, slight LOB to R with decreased step height and length. Pt then completes gait around 4 cones with SPC with min assist, completed to promote improved multidirectional stability. Pt completes mass practice sit to stand transfers 2x5 with emphasis on anterior wt shift, cues for weight in toes and upright gaze. Pt completes step taps to 4 step 2x20 alternating BLE. Pt then completes continuous training on nustep LUE/BLE x10 L4 maintaining ~50 SPM, completed to  promote activity tolerance needed for functional ambulation. Pt returns to room and requests to use restroom, completes ambulatory transfer without device with min assist to toilet, continent of bladder, charted, manages LB dressing with min assist. Pt ambulates back to bed requires supervision for bed mobility remains semi-reclined in bed with all needs within reach, cal light in place, bed alarm activated, and RN and pt family at bedside at end of session.   Therapy Documentation Precautions:  Precautions Precautions: Fall Recall of Precautions/Restrictions: Impaired Precaution/Restrictions Comments: Unaware pf NWB RUE Required Braces or Orthoses: Sling Restrictions Weight Bearing Restrictions Per Provider Order: Yes RUE Weight Bearing Per Provider Order: Non weight bearing    Therapy/Group: Individual Therapy  Reche Ohara PT, DPT 12/11/2023, 2:06 PM

## 2023-12-11 NOTE — Progress Notes (Signed)
 Occupational Therapy Session Note  Patient Details  Name: Jerry [name] MRN: [id number] Date of Birth: 1928/08/10  Today's Date: 12/11/2023 OT Individual Time: 1105-1200 OT Individual Time Calculation (min): 55 min   Today's Date: 12/11/2023 OT Individual Time: 1317-1401 OT Individual Time Calculation (min): 44 min (10 Missed minutes due to fatigue)  Short Term Goals: Week 1:  OT Short Term Goal 1 (Week 1): Pt will perform toilet transfer with Min A + LRAD. OT Short Term Goal 2 (Week 1): Pt will perform 1/3 toileting tasks with Min A for standing balance. OT Short Term Goal 3 (Week 1): Pt will thread LB garments with Min A + LRAD.  Skilled Therapeutic Interventions/Progress Updates:   Session 1: Pt greeted resting in bed for skilled OT session with focus on toileting, functional mobility, and higher balance activities.   Pain: Pt with no reports of pain. OT offering intermediate rest breaks and positioning suggestions throughout session to address potential pain/fatigue and maximize participation/safety in session.   Functional Transfers: Ambulating transfers, including room<>main therapy gym, CGA + hurrycane. Intermediate Min A to rise from low surfaces.   Self Care Tasks: 3/3 toileting tasks with Min A for thorough pericare due to messy smear.   Therapeutic Activities: Pt instructed in the activities below for BLE strengthening, dynamic standing balance, and decreasing fall risk, details below:  Alternating step-overs to simulate household level thresholds, 2x10 reps with increased difficulty with RLE step height/length. CGA + hurry-cane.   Therapeutic Exercise: Pt completes 2x10 reps of the exercises below for promotion of R-shoulder ROM (within precautions due to fx), details below: Flexion/extension Abduction/adduction   UE ranger utilized for the above with patient bending at waist for pain management/increased ROM. Improved abduction within this plane of movement.   Pt  remained sitting in WC with 4Ps assessed and immediate needs met. Pt continues to be appropriate for skilled OT intervention to promote further functional independence in ADLs/IADLs.    Session 2: Pt greeted resting in bed  for skilled OT session with focus on BADL retraining.   Pain: Pt with no reports of pain. OT offering intermediate rest breaks and positioning suggestions throughout session to address pain/fatigue and maximize participation/safety in session.   Functional Transfers: Ambulatory shower transfers with Min A (L HHA).   Self Care Tasks: Pt completes the following self care tasks with levels of assistance noted below, UB: Setup A for UB bathing/dressing.  LB: Introduced long-handled sponge for increased reach towards distal LE, assistance provided for thorough posterior pericare. Min A for threading BLE into pants.  Of note, patient continues to require heavy multimodal cuing during ADLs to maintain RUE NWB.   Pt remained resting in bed with 4Ps assessed and immediate needs met. Pt continues to be appropriate for skilled OT intervention to promote further functional independence in ADLs/IADLs.   Therapy Documentation Precautions:  Precautions Precautions: Fall Recall of Precautions/Restrictions: Impaired Precaution/Restrictions Comments: Unaware pf NWB RUE Required Braces or Orthoses: Sling Restrictions Weight Bearing Restrictions Per Provider Order: Yes RUE Weight Bearing Per Provider Order: Non weight bearing   Therapy/Group: Individual Therapy  Nereida Habermann, OTR/L, MSOT  12/11/2023, 8:00 AM

## 2023-12-11 NOTE — Progress Notes (Addendum)
 PROGRESS NOTE   Subjective/Complaints:  No events overnight.  Wife and family at bedside, stating patient has not slept over the past few days with as needed melatonin.  Patient complains of headache, right sided, describes as a nerve pain.  States has been having it for several days, although family states he only started complaining about it last night.  No vision deficits, response oxycodone .  Vitals stable.  A.m. labs with some mild hyponatremia, otherwise stable.    12/11/2023    6:30 AM 12/10/2023    8:38 PM 12/10/2023    1:30 PM  Vitals with BMI  Systolic 135 118 885  Diastolic 76 55 67  Pulse 81 92 85    Recent Labs    12/08/23 2018 12/09/23 0519  GLUCAP 128* 122*     P.o. intakes appropriate 50 to 75% of most meals Continent of bladder  Last BM 10-19, medium  ROS: Patient denies fever, rash, sore throat, blurred vision, dizziness, nausea, vomiting, diarrhea, cough, shortness of breath or chest pain, joint or back/neck pain, or mood change.  + Headache + Poor sleep Objective:   No results found. Recent Labs    12/11/23 0635  WBC 10.9*  HGB 12.5*  HCT 37.9*  PLT 396    Recent Labs    12/11/23 0635  NA 134*  K 4.3  CL 100  CO2 22  GLUCOSE 115*  BUN 22  CREATININE 0.94  CALCIUM  8.7*     Intake/Output Summary (Last 24 hours) at 12/11/2023 0807 Last data filed at 12/11/2023 0600 Gross per 24 hour  Intake 708 ml  Output 965 ml  Net -257 ml        Physical Exam: Vital Signs Blood pressure 135/76, pulse 81, temperature 98.1 F (36.7 C), temperature source Oral, resp. rate 18, height 5' 5 (1.651 m), weight 65.6 kg, SpO2 96%.  Constitutional: No distress . Vital signs reviewed.  Laying in bed. HEENT:  Bruising around right face/eye EOMI, oral membranes moist Neck: supple Cardiovascular: RRR without murmur. No JVD    Respiratory/Chest: CTA Bilaterally without wheezes or rales. Normal  effort    GI/Abdomen: BS +, non-tender, non-distended Ext: no clubbing, cyanosis, or edema Psych: pleasant and cooperative  Skin: Some bruising across right forehead and eye, temple.--Stable + Scraping across right knee, with some punctate bleeding, stable--Mepilex + Well-circumscribed dark lesion on right anterior shin, dry--covered in Mepilex + Skin avulsion of left hand--remains covered in clean gauze  MSK:      No apparent deformity.       No TTP along bilateral shoulders or cervical paraspinals.  No TTP along right shoulder/clavicle.  Neurologic exam:  Cognition: AAO to person, place, year and month; unchanged Language: Some substitutions, significant difficulty word finding.--Improving Memory: Moderate deficits. Recalls biographical information.  Insight: Fair insight into current condition.  Mood: Pleasant affect, appropriate mood.  Sensation: Equal and intact in BL UE and Les.  Reflexes:Negative Hoffman's and babinski signs bilaterally.  CN: Right ptosis, otherwise grossly intact Coordination: No apparent tremors. No ataxia on FTN, HTS bilaterally.  Spasticity: MAS 0 in all extremities.  Strength: 4-5 left upper and lower extremity, 5-5 right upper shoulder and  fingers; 5 out of 5 right lower extremity.    Assessment/Plan: 1. Functional deficits which require 3+ hours per day of interdisciplinary therapy in a comprehensive inpatient rehab setting. Physiatrist is providing close team supervision and 24 hour management of active medical problems listed below. Physiatrist and rehab team continue to assess barriers to discharge/monitor patient progress toward functional and medical goals  Care Tool:  Bathing    Body parts bathed by patient: Right arm, Chest, Abdomen, Right upper leg, Left upper leg, Face, Left arm, Front perineal area   Body parts bathed by helper: Right lower leg, Left lower leg, Buttocks     Bathing assist Assist Level: Minimal Assistance - Patient >  75%     Upper Body Dressing/Undressing Upper body dressing   What is the patient wearing?: Pull over shirt    Upper body assist Assist Level: Minimal Assistance - Patient > 75%    Lower Body Dressing/Undressing Lower body dressing      What is the patient wearing?: Pants, Incontinence brief     Lower body assist Assist for lower body dressing: Minimal Assistance - Patient > 75%     Toileting Toileting    Toileting assist Assist for toileting: Minimal Assistance - Patient > 75% (For standing balance)     Transfers Chair/bed transfer  Transfers assist  Chair/bed transfer activity did not occur: Safety/medical concerns  Chair/bed transfer assist level: Contact Guard/Touching assist     Locomotion Ambulation   Ambulation assist      Assist level: Minimal Assistance - Patient > 75% Assistive device: Hand held assist Max distance: 12'   Walk 10 feet activity   Assist     Assist level: Minimal Assistance - Patient > 75% Assistive device: No Device, Hand held assist   Walk 50 feet activity   Assist Walk 50 feet with 2 turns activity did not occur: Safety/medical concerns  Assist level: Minimal Assistance - Patient > 75% Assistive device: Hand held assist, No Device    Walk 150 feet activity   Assist Walk 150 feet activity did not occur: Safety/medical concerns  Assist level: Minimal Assistance - Patient > 75% Assistive device: Hand held assist    Walk 10 feet on uneven surface  activity   Assist     Assist level: Minimal Assistance - Patient > 75% Assistive device: Other (comment) (Lt Handrail)   Wheelchair     Assist Is the patient using a wheelchair?: Yes Type of Wheelchair: Manual    Wheelchair assist level: Dependent - Patient 0% Max wheelchair distance: 150'    Wheelchair 50 feet with 2 turns activity    Assist        Assist Level: Dependent - Patient 0%   Wheelchair 150 feet activity     Assist       Assist Level: Dependent - Patient 0%   Blood pressure 135/76, pulse 81, temperature 98.1 F (36.7 C), temperature source Oral, resp. rate 18, height 5' 5 (1.651 m), weight 65.6 kg, SpO2 96%.  Medical Problem List and Plan: 1. 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  Conservative care per neurosurgery Dr. Alm Molt.             -patient may  shower             -ELOS/Goals: 14-21 days Supervision to min A             -Continue CIR therapies including PT, OT, and SLP    2.  Antithrombotics: -DVT/anticoagulation:  Mechanical: Antiembolism stockings, thigh (TED hose) Bilateral lower extremities             -antiplatelet therapy:  ASPIRIN  AND PLAVIX  resumed 10/17  3. Pain Management: Oxycodone  as needed, Robaxin 500 mg every 8 hours as needed muscle spasms   - No complaints of pain   - 10-16: Bilateral flank pain likely musculoskeletal; discussed with nursing, add aqua thermia, scheduled Robaxin 500 mg 3 times daily   - 10-17: Pain improved with Robaxin, is having more low back pain to today, discussing with nursing getting aquathermia in room  10-20: For right sided headache, added Tylenol  1000 mg 3 times daily and Topamax 25 mg nightly.  If no improvement or if headache quality worsens, recommend repeat CT head.  4. Mood/Behavior/Sleep: Provide emotional support             -antipsychotic agents: N/A  - 10-15: Complains of poor sleep, add melatonin 5 mg nightly as needed--scheduled 10-16--improved  10-20: Ongoing poor sleep.  Trazodone 50 mg nightly added.  5. Neuropsych/cognition: This patient he is capable of making decisions on his own behalf.  6. 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 Fluids/Electrolytes/Nutrition: Routine in and outs with follow-up chemistries   - 10-15: A.m. labs stable, albumin 2.2.  Will get dietary assessment.  8.  Seizure  prophylaxis.  Keppra 750 mg twice daily x 7 days total--completed 9.  Nondisplaced right clavicle fracture.  Conservative care per [name] orthopedic surgery.  Nonweightbearing.  Follow-up x-rays 2 weeks (10/23)  10.  Hypertension.  Cardizem 240 mg daily.  Monitor with increased mobility   - Vitals well-controlled on current regimen    12/11/2023    6:30 AM 12/10/2023    8:38 PM 12/10/2023    1:30 PM  Vitals with BMI  Systolic 135 118 885  Diastolic 76 55 67  Pulse 81 92 85    11.  Hyperlipidemia.  Lipitor 12.  Hospital course hiccups.  Thorazine  10 mg every 4 hours as needed   - No hiccups 10-15 on evaluation 13.  History of popliteal aneurysm status post stenting.  Followed by vascular surgery Dr. Selinda Gu.HOLD ASPIRIN  AND PLAVIX  X ONE WEEK THEN RESUME 14.  History of AAA.  Followed outpatient by Dr. Gu 15.  Constipation.  MiraLAX twice daily, Senokot-S 2 tablets twice daily.   - Large, liquid bowel movement 10-15   - LBM 10/17 - reduced sennakot S to 1 tab BID   - LBM 10/19, medium  16. Urinary frequency/dysuria:  -UA is +, UCX with 100k GNR  -continue empiric keflex 500mg  bid  -encouraged pt to continue drinking despite frequency  10-20: Culture growing pansensitive E. coli; continue current regimen--continuing with urgency, has condom cath nightly    LOS: 6 days A FACE TO FACE EVALUATION WAS PERFORMED  Joesph JAYSON Likes 12/11/2023, 8:07 AM

## 2023-12-12 ENCOUNTER — Other Ambulatory Visit (HOSPITAL_COMMUNITY): Payer: Self-pay

## 2023-12-12 MED ORDER — ACETAMINOPHEN 500 MG PO TABS
1000.0000 mg | ORAL_TABLET | Freq: Four times a day (QID) | ORAL | 0 refills | Status: AC | PRN
  Filled 2023-12-12: qty 30, 4d supply, fill #0

## 2023-12-12 MED ORDER — POLYETHYLENE GLYCOL 3350 17 G PO PACK
17.0000 g | PACK | Freq: Every day | ORAL | Status: DC | PRN
  Administered 2023-12-18: 17 g via ORAL

## 2023-12-12 MED ORDER — ASPIRIN 81 MG PO TBEC: 81.0000 mg | DELAYED_RELEASE_TABLET | Freq: Every day | ORAL | Status: AC

## 2023-12-12 MED ORDER — POLYETHYLENE GLYCOL 3350 17 G PO PACK: 17.0000 g | PACK | Freq: Every day | ORAL | Status: DC | PRN

## 2023-12-12 NOTE — Progress Notes (Signed)
 Physical Therapy Session Note  Patient Details  Name: Jerry [name] MRN: [id number] Date of Birth: Dec 07, 1928  Today's Date: 12/12/2023 PT Individual Time: 0933-1028 PT Individual Time Calculation (min): 55 min   Short Term Goals: Week 1:  PT Short Term Goal 1 (Week 1): Pt will complete bed mobility with minA. PT Short Term Goal 2 (Week 1): Pt will complete sit to stand with minA. PT Short Term Goal 3 (Week 1): Pt will complete bed to chair transfer with minA. PT Short Term Goal 4 (Week 1): Pt will ambulate x100' with minA and LRAD.  Skilled Therapeutic Interventions/Progress Updates:    Pt presents in room in bed, motivated to participate with PT. Pt reporting pain in low back, unrated. Session focused on NMR for dynamic standing balance, BLE coordination, weightshifting, and BLE muscle fiber recruitment needed for functional transfers and gait. Pt completes bed mobility with supervision using hospital bed rail. Pt completes transfers throughout session without device with CGA/light min assist. Pt ambulates from room to day room ~100'. Pt completes NMR without UE support including: - step taps 4 2x20 alternating BLE - step ups 2x5 BLE - forward step/back x10 BLE - lateral step/back x10 BLE - backward step/back x10 BLE - step over 3 hurdles 3 tall , step to gait and step through gait x4 trials each - step over 2 hurdles 6 tall step to gait x4 trials Pt ambulates back to room without device with cues for increasing step length with pt demonstrating improved RLE foot clearance with cues, CGA. Pt returns to room and remains semi reclined in bed with all needs within reach, cal light in place and bed alarm activated at end of session.   Therapy Documentation Precautions:  Precautions Precautions: Fall Recall of Precautions/Restrictions: Impaired Precaution/Restrictions Comments: Unaware pf NWB RUE Required Braces or Orthoses: Sling Restrictions Weight Bearing Restrictions Per  Provider Order: Yes RUE Weight Bearing Per Provider Order: Non weight bearing    Therapy/Group: Individual Therapy  Reche Ohara PT, DPT 12/12/2023, 10:41 AM

## 2023-12-12 NOTE — Progress Notes (Signed)
 Physical Therapy Note  Patient Details  Name: Jerry [name] MRN: [id number] Date of Birth: [birth year] Today's Date: 12/12/2023    Physical Therapist participated in the interdisciplinary team conference, providing clinical information regarding the patient's current status, treatment goals, and weekly focus, including any barriers that need to be addressed. Please see the Inpatient Rehabilitation Team Conference and Plan of Care Update for further details.     Reche Ohara PT, DPT 12/12/2023, 1:14 PM

## 2023-12-12 NOTE — Progress Notes (Signed)
 PROGRESS NOTE   Subjective/Complaints:  No events overnight.  Patient states he slept very well, has had no complaints of headache today. Vitals stable Patient had multiple large bowel movements this a.m.  After taking MiraLAX; asked for laxatives to be removed.  Did have some urinary incontinence overnight, but was continent this a.m.   ROS: Patient denies fever, rash, sore throat, blurred vision, dizziness, nausea, vomiting, diarrhea, cough, shortness of breath or chest pain, joint or back/neck pain, or mood change.   Objective:   No results found. Recent Labs    12/11/23 0635  WBC 10.9*  HGB 12.5*  HCT 37.9*  PLT 396    Recent Labs    12/11/23 0635  NA 134*  K 4.3  CL 100  CO2 22  GLUCOSE 115*  BUN 22  CREATININE 0.94  CALCIUM  8.7*     Intake/Output Summary (Last 24 hours) at 12/12/2023 0951 Last data filed at 12/12/2023 0904 Gross per 24 hour  Intake 808 ml  Output 900 ml  Net -92 ml        Physical Exam: Vital Signs Blood pressure (!) 122/59, pulse 71, temperature 97.8 F (36.6 C), temperature source Oral, resp. rate 18, height 5' 5 (1.651 m), weight 65.6 kg, SpO2 99%.  Constitutional: No distress . Vital signs reviewed.  Laying in bed. HEENT:  Bruising around right face/eye--improving; EOMI, oral membranes moist Neck: supple Cardiovascular: RRR without murmur. No JVD    Respiratory/Chest: CTA Bilaterally without wheezes or rales. Normal effort    GI/Abdomen: BS +, non-tender, non-distended Ext: no clubbing, cyanosis, or edema Psych: pleasant and cooperative  Skin: Some bruising across right forehead and eye, temple.--Stable + Well-circumscribed dark lesion on right anterior shin, dry--covered in Mepilex + Skin avulsion of left hand--remains covered in clean gauze with petroleum barrier; improving  MSK:      No apparent deformity.      No TTP along right shoulder/clavicle.  Neurologic  exam:  Cognition: AAO to person, place, year and month Language: Occasional substitution/difficulty word finding; improving Memory: Moderate deficits. --Baseline per family. Insight: Fair insight into current condition.  Mood: Pleasant affect, appropriate mood.  Sensation: Equal and intact in BL UE and Les.  Reflexes:Negative Hoffman's and babinski signs bilaterally.  CN: Right ptosis, otherwise grossly intact--improving Coordination: No apparent tremors. No ataxia on FTN, HTS bilaterally.  Spasticity: MAS 0 in all extremities.  Strength: 4-5 left upper and lower extremity, 5-5 right upper shoulder and fingers; 5 out of 5 right lower extremity.    Assessment/Plan: 1. Functional deficits which require 3+ hours per day of interdisciplinary therapy in a comprehensive inpatient rehab setting. Physiatrist is providing close team supervision and 24 hour management of active medical problems listed below. Physiatrist and rehab team continue to assess barriers to discharge/monitor patient progress toward functional and medical goals  Care Tool:  Bathing    Body parts bathed by patient: Right arm, Chest, Abdomen, Right upper leg, Left upper leg, Face, Left arm, Front perineal area   Body parts bathed by helper: Right lower leg, Left lower leg, Buttocks     Bathing assist Assist Level: Minimal Assistance - Patient > 75%  Upper Body Dressing/Undressing Upper body dressing   What is the patient wearing?: Pull over shirt    Upper body assist Assist Level: Minimal Assistance - Patient > 75%    Lower Body Dressing/Undressing Lower body dressing      What is the patient wearing?: Pants, Incontinence brief     Lower body assist Assist for lower body dressing: Minimal Assistance - Patient > 75%     Toileting Toileting    Toileting assist Assist for toileting: Minimal Assistance - Patient > 75% (For standing balance)     Transfers Chair/bed transfer  Transfers assist   Chair/bed transfer activity did not occur: Safety/medical concerns  Chair/bed transfer assist level: Contact Guard/Touching assist     Locomotion Ambulation   Ambulation assist      Assist level: Minimal Assistance - Patient > 75% Assistive device: Hand held assist Max distance: 12'   Walk 10 feet activity   Assist     Assist level: Minimal Assistance - Patient > 75% Assistive device: No Device, Hand held assist   Walk 50 feet activity   Assist Walk 50 feet with 2 turns activity did not occur: Safety/medical concerns  Assist level: Minimal Assistance - Patient > 75% Assistive device: Hand held assist, No Device    Walk 150 feet activity   Assist Walk 150 feet activity did not occur: Safety/medical concerns  Assist level: Minimal Assistance - Patient > 75% Assistive device: Hand held assist    Walk 10 feet on uneven surface  activity   Assist     Assist level: Minimal Assistance - Patient > 75% Assistive device: Other (comment) (Lt Handrail)   Wheelchair     Assist Is the patient using a wheelchair?: Yes Type of Wheelchair: Manual    Wheelchair assist level: Dependent - Patient 0% Max wheelchair distance: 150'    Wheelchair 50 feet with 2 turns activity    Assist        Assist Level: Dependent - Patient 0%   Wheelchair 150 feet activity     Assist      Assist Level: Dependent - Patient 0%   Blood pressure (!) 122/59, pulse 71, temperature 97.8 F (36.6 C), temperature source Oral, resp. rate 18, height 5' 5 (1.651 m), weight 65.6 kg, SpO2 99%.  Medical Problem List and Plan: 1. 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  Conservative care per neurosurgery Dr. Alm Molt.             -patient may  shower             -ELOS/Goals: 14-21 days Supervision to min A - 10/28 DC             -Continue CIR therapies including PT, OT, and SLP     - 10/21: SPV to CGA for PT; Min A walking without AD  180 ft; biggest barrier is poor awareness of WB precautions with RUE and balance deficits. OT setup to SPV UB, intermittent Min A LB, needs encouragement for independence. Making excellent progress. At cognitive baseline.   2.  Antithrombotics: -DVT/anticoagulation:  Mechanical: Antiembolism stockings, thigh (TED hose) Bilateral lower extremities             -antiplatelet therapy:  ASPIRIN  AND PLAVIX  resumed 10/17  3. Pain Management: Oxycodone  as needed, Robaxin 500 mg every 8 hours as needed muscle spasms   - No complaints of pain   - 10-16: Bilateral flank pain likely musculoskeletal; discussed with nursing, add aqua  thermia, scheduled Robaxin 500 mg 3 times daily   - 10-17: Pain improved with Robaxin, is having more low back pain to today, discussing with nursing getting aquathermia in room  10-20: For right sided headache, added Tylenol  1000 mg 3 times daily and Topamax 25 mg nightly.  If no improvement or if headache quality worsens, recommend repeat CT head.  10-21: Headache improved/resolved with current regimen  4. Mood/Behavior/Sleep: Provide emotional support             -antipsychotic agents: N/A  - 10-15: Complains of poor sleep, add melatonin 5 mg nightly as needed--scheduled 10-16--improved  10-20: Ongoing poor sleep.  Trazodone 50 mg nightly added.--Significant improvement  5. Neuropsych/cognition: This patient he is capable of making decisions on his own behalf.  6. 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 Fluids/Electrolytes/Nutrition: Routine in and outs with follow-up chemistries   - 10-15: A.m. labs stable, albumin 2.2.  Will get dietary assessment.  8.  Seizure prophylaxis.  Keppra 750 mg twice daily x 7 days total--completed 9.  Nondisplaced right clavicle fracture.  Conservative care per [name] orthopedic surgery.   Nonweightbearing.  Follow-up x-rays 2 weeks (10/23)  10.  Hypertension.  Cardizem 240 mg daily.  Monitor with increased mobility   - Vitals well-controlled on current regimen    12/12/2023    4:16 AM 12/11/2023    8:15 PM 12/11/2023    1:02 PM  Vitals with BMI  Systolic 122 110 860  Diastolic 59 67 60  Pulse 71 77 86    11.  Hyperlipidemia.  Lipitor 12.  Hospital course hiccups.  Thorazine  10 mg every 4 hours as needed   - No hiccups 10-15 on evaluation 13.  History of popliteal aneurysm status post stenting.  Followed by vascular surgery Dr. Selinda Gu.HOLD ASPIRIN  AND PLAVIX  X ONE WEEK THEN RESUME 14.  History of AAA.  Followed outpatient by Dr. Gu 15.  Constipation.  MiraLAX twice daily, Senokot-S 2 tablets twice daily.   - Large, liquid bowel movement 10-15   - LBM 10/17 - reduced sennakot S to 1 tab BID   - LBM 10/19, medium  10-21: Multiple large bowel movements today.  Move MiraLAX to as needed, DC Senokot S  16. Urinary frequency/dysuria:  -UA is +, UCX with 100k GNR  -continue empiric keflex 500mg  bid  -encouraged pt to continue drinking despite frequency  10-20: Culture growing pansensitive E. coli; continue current regimen--continuing with urgency, has condom cath nightly  10-21: Is improving incontinence, remove nighttime catheter to encourage home regimen    LOS: 7 days A FACE TO FACE EVALUATION WAS PERFORMED  Joesph JAYSON Likes 12/12/2023, 9:51 AM

## 2023-12-12 NOTE — Progress Notes (Signed)
 Occupational Therapy Session Note  Patient Details  Name: Jerry [name] MRN: [id number] Date of Birth: [birth year]  Today's Date: 12/12/2023 OT Individual Time: 9194-9084 OT Individual Time Calculation (min): 70 min   Today's Date: 12/12/2023 OT Individual Time: 8694-8584 OT Individual Time Calculation (min): 70 min   Short Term Goals: Week 1:  OT Short Term Goal 1 (Week 1): Pt will perform toilet transfer with Min A + LRAD. OT Short Term Goal 2 (Week 1): Pt will perform 1/3 toileting tasks with Min A for standing balance. OT Short Term Goal 3 (Week 1): Pt will thread LB garments with Min A + LRAD.  Skilled Therapeutic Interventions/Progress Updates:   Session 1: Pt greeted sitting in [facility] for skilled OT session with focus on toileting, functional mobility, and higher-level dynamic standing balance.   Pain: Pt with intermediate reports of back pain during activities,  OT offering intermediate rest breaks and positioning suggestions throughout session to address pain/fatigue and maximize participation/safety in session.   Functional Transfers: Sit<>stands and functional mobility with CGA + no UE support. RUE in sling to promote adherence to RUE NWB.   Self Care Tasks: 3/3 toileting tasks with Min A for thourough pericare due to messy smear BM.   Therapeutic Activities: Pt instructed in the activities below for dynamic standing balance as a means of decreasing fall risk and promoting independence with ADLs/functional mobility, details below:  X3 reps of corn-hole game, patient reaching at multiple heights to retrieve bean bags, CGA provided with no UE support Table-top activity while standing on airex foam (simulating compliant surface), ~5 mins with no LOB and CGA The above activity upgraded to include reaching outside base of support to retrieve/toss bean bags into basketball hoop. CGA provided with mild posterior LOB upon stepping off of airex pad Functional mobility around nurses  station x2 while carrying item in LUE for dual task challenge. Pt continues to require CGA + cuing for RLE step stride length.   Pt remained resting in bed with 4Ps assessed and immediate needs met. Pt continues to be appropriate for skilled OT intervention to promote further functional independence in ADLs/IADLs.   Session 2: Pt greeted resting in bed for skilled OT session with focus on functional transfers, toileting, LB dressing, and RUE ROM/strengthening.   Pain: Pt rwith no reports of pain. OT offering intermediate rest breaks and positioning suggestions throughout session to address potential pain/fatigue and maximize participation/safety in session.   Functional Transfers: Sit<>stands and household-level ambulation with CGA + no UE support. Bed mobility with supervision.   Self Care Tasks: 3/3 toileting tasks with CGA for standing balance (light Min A for assurance of posterior care). Pt educated on use of reacher for threading of BLE, increased time required + light Min A due to newness of AD. Hike of garments with CGA for standing balance.   Therapeutic Exercise: Pt instructed in series of table glides targeting RUE ROM (within precautions), details below: Shoulder Abduction/adduction Shoulder Internal/external rotation Shoulder and Elbow Flexion/Extension Forearm Supination/Pronation Wrist curls with 1# medicine ball Gross Grip onto medicine ball   Pt completes 2x10 reps with multimodal cuing for correct form. Washcloths utilized for management of friction on table-top.   Pt remained resting in bed with 4Ps assessed and immediate needs met. Pt continues to be appropriate for skilled OT intervention to promote further functional independence in ADLs/IADLs.   Therapy Documentation Precautions:  Precautions Precautions: Fall Recall of Precautions/Restrictions: Impaired Precaution/Restrictions Comments: Unaware pf NWB RUE Required Braces or  Orthoses: Sling Restrictions Weight  Bearing Restrictions Per Provider Order: Yes RUE Weight Bearing Per Provider Order: Non weight bearing   Therapy/Group: Individual Therapy  Nereida Habermann, OTR/L, MSOT  12/12/2023, 7:59 AM

## 2023-12-12 NOTE — Patient Care Conference (Cosign Needed Addendum)
 Inpatient RehabilitationTeam Conference and Plan of Care Update Date: 12/12/2023   Time: 1032 am   Patient was admitted after the interdisciplinary team meeting took place on 12/05/2023; as a result, the first team conference occurred on day 8 of this patient's stay.     Patient Name: Jerry [name]      Medical Record Number: [id number]  Date of Birth: February 01, 1929 Sex: Male         Room/Bed: 4W15C/4W15C-01 Payor Info: Payor: MEDICARE / Plan: MEDICARE PART A AND B / Product Type: *No Product type* /    Admit Date/Time:  12/05/2023  8:55 PM  Primary Diagnosis:  Traumatic subdural hematoma (SDH) [facility])  Hospital Problems: Principal Problem:   Traumatic subdural hematoma (SDH) (HCC) Active Problems:   Traumatic subdural hematoma (HCC)   Malnutrition of moderate degree    Expected Discharge Date: Expected Discharge Date: 12/19/23  Team Members Present: Physician leading conference: Dr. Joesph Likes Social Worker Present: Graeme Jude, LCSW Nurse Present: Eulalio Falls, RN PT Present: Catilin Osborn, PT OT Present: Nereida Habermann, OT SLP Present: Recardo Mole, SLP PPS Coordinator present : Eleanor Colon, SLP     Current Status/Progress Goal Weekly Team Focus  Bowel/Bladder   Patient is continent x2 but requested a condom catheter at night. Last bm 10/20.  Improving continence Remain continent of bowel and bladder.   Assist in toileting needs.    Swallow/Nutrition/ Hydration               ADL's   Setup/supervision for UB ADLs, Min A for LB ADLs due to limited functional reach/abdominal discomfort. Barriers: Decreased implementation of RUE NWB and decreased activity tolerance.   Supervision   AE training as needed and caregiver education    Mobility   supervision bed mob, CGA/light min transfers, CGA/light min assist gait no device 180'   supervision ambulatory  barriers: poor adherence to WB precautions, balance; focus on balance and gait training     Communication   no ST            Safety/Cognition/ Behavioral Observations  no ST            Pain   Denies pain.   Pain of 0.   Assess pain q shift and prn.    Skin   Deep skin tear to left hand. Wound appears pale pink. Dressing changed with Vashe cleanse, xeroform,nonadherent pad and gauze.   Monitor for s/sx of infection to wound.  Assess skin q shift and prn.      Discharge Planning:  Pt will d/c to home with his wife as primary caregiver. Pt dtr [name] from [place] will be with them for a period of time but will have help as much as possible. SW will confirm there are no barriers to discharge.    Team Discussion: Patient was admitted post traumatic right frontal subdural hematoma. Patient with UTI/ headaches /poor sleep: medication adjusted by MD. Patient progress limited by poor awareness of weight bearing precautions of RUE , decrease activity tolerance and balance deficits.   Patient on target to meet rehab goals: yes, currently patient  supervision assistance with upper body care and intermittent minimal assistance with lower body care. Patient needs encouragement for independence but making excellent progress. Patient was able to ambulate up to 180' with minimal assistance with no device. Patient is making excellent progress and at cognitive baseline. Overall goals at discharge are set for supervision assistance.  *See Care Plan and progress notes for long  and short-term goals.   Revisions to Treatment Plan:  Remove night time condom catheter Arm sling   Teaching Needs: Safety, medications, transfers, toileting, weight bearing restrictions, etc.   Current Barriers to Discharge: Decreased caregiver support and Home enviroment access/layout  Possible Resolutions to Barriers: Family Education Home health follow up     Medical Summary Current Status: Medically complicated by headache, back pain, right clavicle fracture with weightbearing restrictions, cognitive  deficits, weakness, sleep difficulty, urinary tract infection, intermittent incontinence of bladder, and constipation/diarrhea  Barriers to Discharge: Cardiac Complications;Incontinence;Medical stability;Infection/IV Antibiotics;Self-care education;Uncontrolled Pain;Weight bearing restrictions   Possible Resolutions to [employer] Focus: Finish treatment for UTI, titrate sleep medications, titrate pain medications to minimal tolerable dosing for headache and back pain, follow-up x-rays for clavicle fracture and weightbearing precautions   Continued Need for Acute Rehabilitation Level of Care: The patient requires daily medical management by a physician with specialized training in physical medicine and rehabilitation for the following reasons: Direction of a multidisciplinary physical rehabilitation program to maximize functional independence : Yes Medical management of patient stability for increased activity during participation in an intensive rehabilitation regime.: Yes Analysis of laboratory values and/or radiology reports with any subsequent need for medication adjustment and/or medical intervention. : Yes   I attest that I was present, lead the team conference, and concur with the assessment and plan of the team.   Aprille Sawhney Gayo 12/12/2023, 1032 am

## 2023-12-12 NOTE — Progress Notes (Signed)
 Patient ID: Jerry [name], male   DOB: [birth year], 89 y.o.   MRN: [id number]  SW met with pt and pt wife in room to provide updates from team conference, and d/c date 10/28. Family will follow-up about preferred HHA.   [name] called SW and inquired about HHA. SW will provide HHA list for review.  *SW provided form.   Graeme Jude, MSW, LCSW Office: [phone] Cell: [phone] Fax: [phone]

## 2023-12-12 NOTE — Progress Notes (Signed)
 Occupational Therapy Note  Patient Details  Name: ELAZAR ARGABRIGHT MRN: [id number] Date of Birth: 1928/12/27   Occupational Therapist participated in the interdisciplinary team conference, providing clinical information regarding the patient's current status, treatment goals, and weekly focus, including any barriers that need to be addressed. Please see the Inpatient Rehabilitation Team Conference and Plan of Care Update for further details.     Nereida Habermann, OTR/L, MSOT  12/12/2023, 2:17 PM

## 2023-12-13 ENCOUNTER — Inpatient Hospital Stay (HOSPITAL_COMMUNITY)

## 2023-12-13 MED ORDER — FLAVOXATE HCL 100 MG PO TABS
100.0000 mg | ORAL_TABLET | Freq: Three times a day (TID) | ORAL | Status: DC
  Administered 2023-12-13 – 2023-12-14 (×3): 100 mg via ORAL
  Filled 2023-12-13 (×5): qty 1

## 2023-12-13 MED ORDER — TAMSULOSIN HCL 0.4 MG PO CAPS
0.4000 mg | ORAL_CAPSULE | Freq: Every day | ORAL | Status: DC
  Administered 2023-12-13 – 2023-12-14 (×2): 0.4 mg via ORAL
  Filled 2023-12-13 (×2): qty 1

## 2023-12-13 NOTE — Plan of Care (Signed)
  Problem: Consults Goal: RH BRAIN INJURY PATIENT EDUCATION Description: Description: See Patient Education module for eduction specifics Outcome: Progressing   Problem: RH BOWEL ELIMINATION Goal: RH STG MANAGE BOWEL WITH ASSISTANCE Description: STG Manage Bowel with mod I Assistance. Outcome: Progressing Goal: RH STG MANAGE BOWEL W/MEDICATION W/ASSISTANCE Description: STG Manage Bowel with Medication with mod I  Assistance. Outcome: Progressing   Problem: RH BLADDER ELIMINATION Goal: RH STG MANAGE BLADDER WITH ASSISTANCE Description: STG Manage Bladder With toileting Assistance Outcome: Progressing   Problem: RH SAFETY Goal: RH STG ADHERE TO SAFETY PRECAUTIONS W/ASSISTANCE/DEVICE Description: STG Adhere to Safety Precautions With cues Assistance/Device. Outcome: Progressing   Problem: RH COGNITION-NURSING Goal: RH STG USES MEMORY AIDS/STRATEGIES W/ASSIST TO PROBLEM SOLVE Description: STG Uses Memory Aids/Strategies With cues Assistance to Problem Solve. Outcome: Progressing   Problem: RH PAIN MANAGEMENT Goal: RH STG PAIN MANAGED AT OR BELOW PT'S PAIN GOAL Description: Pain < 4 with prns Outcome: Progressing   Problem: RH KNOWLEDGE DEFICIT BRAIN INJURY Goal: RH STG INCREASE KNOWLEDGE OF SELF CARE AFTER BRAIN INJURY Description: Patient and spouse will be able to manage care using educational resources for medications and dietary modification, etc, independently Outcome: Progressing

## 2023-12-13 NOTE — Progress Notes (Signed)
 Physical Therapy Session Note  Patient Details  Name: Jerry [name] MRN: [id number] Date of Birth: [birth year]  Today's Date: 12/13/2023 PT Missed Time: 60 Minutes Missed Time Reason: CT/MRI  Short Term Goals: Week 1:  PT Short Term Goal 1 (Week 1): Pt will complete bed mobility with minA. PT Short Term Goal 2 (Week 1): Pt will complete sit to stand with minA. PT Short Term Goal 3 (Week 1): Pt will complete bed to chair transfer with minA. PT Short Term Goal 4 (Week 1): Pt will ambulate x100' with minA and LRAD.  Skilled Therapeutic Interventions/Progress Updates:     Pt awaiting transport for CT. PT will follow up as able.   Therapy Documentation Precautions:  Precautions Precautions: Fall Recall of Precautions/Restrictions: Impaired Precaution/Restrictions Comments: Unaware pf NWB RUE Required Braces or Orthoses: Sling Restrictions Weight Bearing Restrictions Per Provider Order: Yes RUE Weight Bearing Per Provider Order: Non weight bearing General: PT Amount of Missed Time (min): 60 Minutes PT Missed Treatment Reason: CT/MRI    Therapy/Group: Individual Therapy  Elsie JAYSON Dawn, PT, DPT 12/13/2023, 4:29 PM
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
## 2023-12-13 NOTE — Progress Notes (Addendum)
 PROGRESS NOTE   Subjective/Complaints:  No events overnight.  Had some back pain overnight, now wrapping to the R flank and groin with spasms. + Hx renal stones. Pain with urination but no hematuria.   Vitals are stable. No further bowel movements, but continues with urinary urgency overnight.   ROS: Patient denies fever, rash, sore throat, blurred vision, dizziness, nausea, vomiting, diarrhea, cough, shortness of breath or chest pain, joint or back/neck pain, or mood change.  + flank / urinary spasms  Objective:   No results found. Recent Labs    12/11/23 0635  WBC 10.9*  HGB 12.5*  HCT 37.9*  PLT 396    Recent Labs    12/11/23 0635  NA 134*  K 4.3  CL 100  CO2 22  GLUCOSE 115*  BUN 22  CREATININE 0.94  CALCIUM  8.7*     Intake/Output Summary (Last 24 hours) at 12/13/2023 0816 Last data filed at 12/13/2023 9378 Gross per 24 hour  Intake 804 ml  Output 350 ml  Net 454 ml        Physical Exam: Vital Signs Blood pressure (!) 134/58, pulse 67, temperature 97.9 F (36.6 C), temperature source Oral, resp. rate 16, height 5' 5 (1.651 m), weight 64 kg, SpO2 100%.  Constitutional: No distress . Vital signs reviewed.  Laying in bed. HEENT:  Bruising around right face/eye--improving; EOMI, oral membranes moist Neck: supple Cardiovascular: RRR without murmur. No JVD    Respiratory/Chest: CTA Bilaterally without wheezes or rales. Normal effort    GI/Abdomen: BS +, non-tender, non-distended Ext: no clubbing, cyanosis, or edema Psych: pleasant and cooperative  Skin: Some bruising across right forehead and eye, temple.--Stable + Well-circumscribed dark lesion on right anterior shin, dry--covered in Mepilex + Skin avulsion of left hand--remains covered in clean gauze with petroleum barrier; improving  MSK:      No apparent deformity.      No TTP along right shoulder/clavicle. + TTP R flank,  groin  Neurologic exam:  Cognition: AAO to person, place, year and month Language: Occasional substitution/difficulty word finding; improving Memory: Moderate deficits. --Baseline per family. Insight: Fair insight into current condition.  Mood: Pleasant affect, appropriate mood.  Sensation: Equal and intact in BL UE and Les.  Reflexes:Negative Hoffman's and babinski signs bilaterally.  CN: Right ptosis, otherwise grossly intact--improving Coordination: No apparent tremors. No ataxia on FTN, HTS bilaterally.  Spasticity: MAS 0 in all extremities.  Strength: 4-5 left upper and lower extremity, 5-5 right upper shoulder and fingers; 5 out of 5 right lower extremity.   Physical exam unchanged from the above on reexamination 12/13/23    Assessment/Plan: 1. Functional deficits which require 3+ hours per day of interdisciplinary therapy in a comprehensive inpatient rehab setting. Physiatrist is providing close team supervision and 24 hour management of active medical problems listed below. Physiatrist and rehab team continue to assess barriers to discharge/monitor patient progress toward functional and medical goals  Care Tool:  Bathing    Body parts bathed by patient: Right arm, Chest, Abdomen, Right upper leg, Left upper leg, Face, Left arm, Front perineal area   Body parts bathed by helper: Right lower leg, Left lower leg, Buttocks  Bathing assist Assist Level: Minimal Assistance - Patient > 75%     Upper Body Dressing/Undressing Upper body dressing   What is the patient wearing?: Pull over shirt    Upper body assist Assist Level: Minimal Assistance - Patient > 75%    Lower Body Dressing/Undressing Lower body dressing      What is the patient wearing?: Pants, Incontinence brief     Lower body assist Assist for lower body dressing: Minimal Assistance - Patient > 75%     Toileting Toileting    Toileting assist Assist for toileting: Minimal Assistance - Patient > 75%  (For standing balance)     Transfers Chair/bed transfer  Transfers assist  Chair/bed transfer activity did not occur: Safety/medical concerns  Chair/bed transfer assist level: Contact Guard/Touching assist     Locomotion Ambulation   Ambulation assist      Assist level: Minimal Assistance - Patient > 75% Assistive device: Hand held assist Max distance: 12'   Walk 10 feet activity   Assist     Assist level: Minimal Assistance - Patient > 75% Assistive device: No Device, Hand held assist   Walk 50 feet activity   Assist Walk 50 feet with 2 turns activity did not occur: Safety/medical concerns  Assist level: Minimal Assistance - Patient > 75% Assistive device: Hand held assist, No Device    Walk 150 feet activity   Assist Walk 150 feet activity did not occur: Safety/medical concerns  Assist level: Minimal Assistance - Patient > 75% Assistive device: Hand held assist    Walk 10 feet on uneven surface  activity   Assist     Assist level: Minimal Assistance - Patient > 75% Assistive device: Other (comment) (Lt Handrail)   Wheelchair     Assist Is the patient using a wheelchair?: Yes Type of Wheelchair: Manual    Wheelchair assist level: Dependent - Patient 0% Max wheelchair distance: 150'    Wheelchair 50 feet with 2 turns activity    Assist        Assist Level: Dependent - Patient 0%   Wheelchair 150 feet activity     Assist      Assist Level: Dependent - Patient 0%   Blood pressure (!) 134/58, pulse 67, temperature 97.9 F (36.6 C), temperature source Oral, resp. rate 16, height 5' 5 (1.651 m), weight 64 kg, SpO2 100%.  Medical Problem List and Plan: 1. 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  Conservative care per neurosurgery Dr. Alm Molt.             -patient may  shower             -ELOS/Goals: 14-21 days Supervision to min A - 10/28 DC             -Continue CIR therapies  including PT, OT, and SLP     - 10/21: SPV to CGA for PT; Min A walking without AD 180 ft; biggest barrier is poor awareness of WB precautions with RUE and balance deficits. OT setup to SPV UB, intermittent Min A LB, needs encouragement for independence. Making excellent progress. At cognitive baseline.   2.  Antithrombotics: -DVT/anticoagulation:  Mechanical: Antiembolism stockings, thigh (TED hose) Bilateral lower extremities             -antiplatelet therapy:  ASPIRIN  AND PLAVIX  resumed 10/17  3. Pain Management: Oxycodone  as needed, Robaxin 500 mg every 8 hours as needed muscle spasms   - No complaints of pain   -  10-16: Bilateral flank pain likely musculoskeletal; discussed with nursing, add aqua thermia, scheduled Robaxin 500 mg 3 times daily   - 10-17: Pain improved with Robaxin, is having more low back pain to today, discussing with nursing getting aquathermia in room  10-20: For right sided headache, added Tylenol  1000 mg 3 times daily and Topamax 25 mg nightly.  If no improvement or if headache quality worsens, recommend repeat CT head.  10-21: Headache improved/resolved with current regimen  4. Mood/Behavior/Sleep: Provide emotional support             -antipsychotic agents: N/A  - 10-15: Complains of poor sleep, add melatonin 5 mg nightly as needed--scheduled 10-16--improved  10-20: Ongoing poor sleep.  Trazodone 50 mg nightly added.--Significant improvement  5. Neuropsych/cognition: This patient he is capable of making decisions on his own behalf.  6. 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 Fluids/Electrolytes/Nutrition: Routine in and outs with follow-up chemistries   - 10-15: A.m. labs stable, albumin 2.2.  Will get dietary assessment.  8.  Seizure prophylaxis.  Keppra 750 mg twice daily x 7 days total--completed 9.  Nondisplaced  right clavicle fracture.  Conservative care per [name] orthopedic surgery.  Nonweightbearing.  Follow-up x-rays 2 weeks (10/23)  10.  Hypertension.  Cardizem 240 mg daily.  Monitor with increased mobility   - Vitals well-controlled on current regimen    12/13/2023    3:22 AM 12/12/2023    7:26 PM 12/12/2023    1:00 PM  Vitals with BMI  Weight 141 lbs 2 oz    BMI 23.48    Systolic 134 101 889  Diastolic 58 57 65  Pulse 67 90 79    11.  Hyperlipidemia.  Lipitor 12.  Hospital course hiccups.  Thorazine  10 mg every 4 hours as needed   - No hiccups 10-15 on evaluation 13.  History of popliteal aneurysm status post stenting.  Followed by vascular surgery Dr. Selinda Gu.HOLD ASPIRIN  AND PLAVIX  X ONE WEEK THEN RESUME 14.  History of AAA.  Followed outpatient by Dr. Gu 15.  Constipation.  MiraLAX twice daily, Senokot-S 2 tablets twice daily.   - Large, liquid bowel movement 10-15   - LBM 10/17 - reduced sennakot S to 1 tab BID   - LBM 10/19, medium  10-21: Multiple large bowel movements today.  Move MiraLAX to as needed, DC Senokot S  16. Urinary frequency/dysuria/flank pain:  -UA is +, UCX with 100k GNR  -continue empiric keflex 500mg  bid  -encouraged pt to continue drinking despite frequency  10-20: Culture growing pansensitive E. coli; continue current regimen--continuing with urgency, has condom cath nightly  10-21: Is improving incontinence, remove nighttime catheter to encourage home regimen   10-22:  Start Flomax 0.4 mg nightly. CT renal study ordered for today. Urispas 100 mg TID. Encourage PO fluids   LOS: 8 days A FACE TO FACE EVALUATION WAS PERFORMED  Joesph JAYSON Likes 12/13/2023, 8:16 AM

## 2023-12-13 NOTE — Progress Notes (Signed)
 Occupational Therapy Weekly Progress Note  Patient Details  Name: Jerry [name] MRN: [id number] Date of Birth: 1928/09/06  Beginning of progress report period: December 06, 2023 End of progress report period: December 13, 2023  Today's Date: 12/13/2023 OT Individual Time: 1005-1050 OT Individual Time Calculation (min): 45 min   Today's Date: 12/13/2023 OT Individual Time: 1305-1400 OT Individual Time Calculation (min): 55 min    Patient has met 3 of 3 short term goals. See below for update of patient functioning.   Patient continues to demonstrate the following deficits: muscle weakness, decreased cardiorespiratoy endurance, decreased safety awareness and decreased memory, and decreased standing balance and decreased balance strategies and therefore will continue to benefit from skilled OT intervention to enhance overall performance with BADL and Reduce care partner burden.  Patient progressing toward long term goals..  Continue plan of care.  OT Short Term Goals Week 1:  OT Short Term Goal 1 (Week 1): Pt will perform toilet transfer with Min A + LRAD. OT Short Term Goal 1 - Progress (Week 1): Met OT Short Term Goal 2 (Week 1): Pt will perform 1/3 toileting tasks with Min A for standing balance. OT Short Term Goal 2 - Progress (Week 1): Met OT Short Term Goal 3 (Week 1): Pt will thread LB garments with Min A + LRAD. OT Short Term Goal 3 - Progress (Week 1): Met Week 2:  OT Short Term Goal 1 (Week 2): STGs=LTGs due to patient's estimated length of stay.  Skilled Therapeutic Interventions/Progress Updates:   Session 1: Pt greeted resting in bed for skilled OT session with focus on functional mobility, toileting, and higher-level balance activities.   Pain: Pt with un-rated generalized pain. OT offering intermediate rest breaks and positioning suggestions throughout session to address pain/fatigue and maximize participation/safety in session.   Functional Transfers: CGA  progressing to close supervision for household ambulation.   Self Care Tasks: 3/3 toileting tasks with CGA, progressing to close supervision, setup for washcloths. Pt able to thread BLE into loose sweat-pants this session with increased time but supervision level. Max A for shoe management.   Therapeutic Activities: Pt instructed in the activities below for BLE coordination, stability, and dynamic standing balance to target fall prevention, details below: 6x41ft backward stepping, CGA with no UE support 6x76ft sideways stepping, CGA + increased cuing (including tactile at pelvis) to prevent outward rotation of LLE walking towards the left.  Forward, backward, and diagonal steps within quadrant layout, x2 lateral LOB with Min A to recover.   Pt remained resting in bed with 4Ps assessed and immediate needs met. Pt continues to be appropriate for skilled OT intervention to promote further functional independence in ADLs/IADLs.   Session 2:  Pt greeted resting in bed for skilled OT session with focus on BADL retraining and functional transfers.   Pain: Pt with no reports of pain. OT offering intermediate rest breaks and positioning suggestions throughout session to address pain/fatigue and maximize participation/safety in session.   Functional Transfers: Ambulatory transfers with CGA progressing to close supervision.   Self Care Tasks: Pt completes the following self care tasks with levels of assistance noted below, UB: Bathing/dressing with setup/supervision, cuing to complete tasks in sitting for decreased fall risk.  LB: BSC utilized for increased posterior periarea reach (and to prevent patient from using RUE to hold onto grab bar). Long-handled sponge used of distal LE(s) and periarea. Pt able to thread BLE into brief/pants with Min A, standing hike with CGA-close supervision. Pt shares  he does not wear socks at home, but has a sock-aide is needed.   Therapeutic Activities: ~200 ft of  functional mobility completed post-shower to address decreased activity tolerance. Pt completes with CGA progressing to close supervision, demo' improvements in RLE stride.   Pt remained resting in bed with 4Ps assessed and immediate needs met. Pt continues to be appropriate for skilled OT intervention to promote further functional independence in ADLs/IADLs.   Therapy Documentation Precautions:  Precautions Precautions: Fall Recall of Precautions/Restrictions: Impaired Precaution/Restrictions Comments: Unaware pf NWB RUE Required Braces or Orthoses: Sling Restrictions Weight Bearing Restrictions Per Provider Order: Yes RUE Weight Bearing Per Provider Order: Non weight bearing   Therapy/Group: Individual Therapy  Nereida Habermann, OTR/L, MSOT  12/13/2023, 7:57 AM

## 2023-12-13 NOTE — Progress Notes (Signed)
 Patient ID: OLIVERIO CHO, male   DOB: [birth year], 89 y.o.   MRN: [id number]  Late Entry: 10/16- SW received updates from [facility] [place] reporting that pt does not appear to see a  PCP or has an assigned SW at [facility]. Encouraged to follow-up with Thais Leos, SW in [place] [phone] to get guidance on next steps.   12/13/23- 1328- SW left message for Thais Leos to inquire about benefits. SW waiting on follow-up.   Graeme Jude, MSW, LCSW Office: [phone] Cell: [phone] Fax: [phone]

## 2023-12-13 NOTE — Progress Notes (Signed)
 Physical Therapy Session Note  Patient Details  Name: Jerry [name] MRN: [id number] Date of Birth: Apr 19, 1928  Today's Date: 12/13/2023 PT Individual Time: 1416-1510 PT Individual Time Calculation (min): 54 min   Short Term Goals: Week 1:  PT Short Term Goal 1 (Week 1): Pt will complete bed mobility with minA. PT Short Term Goal 2 (Week 1): Pt will complete sit to stand with minA. PT Short Term Goal 3 (Week 1): Pt will complete bed to chair transfer with minA. PT Short Term Goal 4 (Week 1): Pt will ambulate x100' with minA and LRAD.  Skilled Therapeutic Interventions/Progress Updates:     Pt received supine in bed and agrees to therapy. Reports pain in Rt flank and RUE. PT provides rest breaks as needed to manage pain. Pt performs supine to sit with modA and facilitation at trunk for sequencing. Pt performs stand step transfer to [facility] with CGA and cues for positioning. WC transport to gym. Pt performs si tto stand, then ambulates x175' with CGA and no AD, with cues to increase Rt stride length and step height to decrease risk for falls. Following rest breaks, pt performs alternating foot taps on on 3 step with occasional minA for slight LOBs. PT provides cues to shift weight fully over stance foot to improve balance. Pt completes x20 total prior to rest break. Activity progressed by having pt perform alternating step ups onto step with CGA/minA and cues for weight shifting and step position. Pt performs x2 bouts of step ups. Pt then ambulates x175' while holding onto water cup to provide multitasking challenge. Pt completes with CGA and same cues. Following, pt left seated with all needs within reach.   Therapy Documentation Precautions:  Precautions Precautions: Fall Recall of Precautions/Restrictions: Impaired Precaution/Restrictions Comments: Unaware pf NWB RUE Required Braces or Orthoses: Sling Restrictions Weight Bearing Restrictions Per Provider Order: Yes RUE Weight Bearing Per  Provider Order: Non weight bearing    Therapy/Group: Individual Therapy  Elsie JAYSON Dawn, PT, DPT 12/13/2023, 4:24 PM

## 2023-12-14 ENCOUNTER — Inpatient Hospital Stay (HOSPITAL_COMMUNITY)

## 2023-12-14 ENCOUNTER — Other Ambulatory Visit (HOSPITAL_COMMUNITY): Payer: Self-pay

## 2023-12-14 MED ORDER — METHOCARBAMOL 500 MG PO TABS
1000.0000 mg | ORAL_TABLET | Freq: Three times a day (TID) | ORAL | Status: DC
Start: 2023-12-14 — End: 2023-12-16
  Administered 2023-12-14 – 2023-12-16 (×6): 1000 mg via ORAL
  Filled 2023-12-14 (×6): qty 2

## 2023-12-14 NOTE — Progress Notes (Signed)
 Patient ID: Jerry [name], male   DOB: January 31, 1929, 89 y.o.   MRN: [id number]   1123- SW received call from [facility] [place] with [facility] reporting that pt is only enrolled for certain requests such as ophthalmology. No PCP. If he would like to establish care he can call:  Eligibility & Enrollment [phone], ext. [phone] to establish primary care.  Graeme Jude, MSW, LCSW Office: [phone] Cell: [phone] Fax: [phone]

## 2023-12-14 NOTE — Progress Notes (Signed)
 Occupational Therapy Session Note  Patient Details  Name: Jerry [name] MRN: [id number] Date of Birth: [birth year]  {CHL IP REHAB OT TIME CALCULATIONS:[id number]}   Short Term Goals: Week 2:  OT Short Term Goal 1 (Week 2): STGs=LTGs due to patient's estimated length of stay.  Skilled Therapeutic Interventions/Progress Updates:   Session 1: Pt greeted *** for skilled OT session with focus on ***.   Pain: Pt reported ***/10 pain, stating *** in reference to ***. OT offering intermediate rest breaks and positioning suggestions throughout session to address pain/fatigue and maximize participation/safety in session.   Functional Transfers:  Self Care Tasks: Pt completes the following self care tasks with levels of assistance noted below, UB: LB:   Therapeutic Activities:  Therapeutic Exercise:   Education:  Pt remained *** with 4Ps assessed and immediate needs met. Pt continues to be appropriate for skilled OT intervention to promote further functional independence in ADLs/IADLs.   Session 2:  Pt greeted *** for skilled OT session with focus on ***.   Pain: Pt reported ***/10 pain, stating *** in reference to ***. OT offering intermediate rest breaks and positioning suggestions throughout session to address pain/fatigue and maximize participation/safety in session.   Functional Transfers:  Self Care Tasks: Pt completes the following self care tasks with levels of assistance noted below, UB: LB:   Therapeutic Activities:  Therapeutic Exercise:   Education:  Pt remained *** with 4Ps assessed and immediate needs met. Pt continues to be appropriate for skilled OT intervention to promote further functional independence in ADLs/IADLs.   Therapy Documentation Precautions:  Precautions Precautions: Fall Recall of Precautions/Restrictions: Impaired Precaution/Restrictions Comments: Unaware pf NWB RUE Required Braces or Orthoses: Sling Restrictions Weight Bearing  Restrictions Per Provider Order: Yes RUE Weight Bearing Per Provider Order: Non weight bearing   Therapy/Group: Individual Therapy  Nereida Habermann, OTR/L, MSOT  12/14/2023, 4:15 PM

## 2023-12-14 NOTE — Progress Notes (Signed)
 PROGRESS NOTE   Subjective/Complaints:  No events overnight. Ongoing back pain, bilateral overnight.  Resolved completely with heat pad, but returned once he started mobility this a.m.  Ongoing urinary urgency overnight; prostate enlargement on CT but no obstructive stones or s/s infection.   Did review results of CT abdomen and pelvis with family at bedside, including pertinent findings of enlarged prostate, diverticulosis, nonobstructive renal stones, mild pleural effusion, and lumbar degenerative disc disease with mild anteriolisthesis of L5-S1.  All questions answered at bedside.  Continent B/b, LBM 10/22  ROS: Patient denies fever, rash, sore throat, blurred vision, dizziness, nausea, vomiting, diarrhea, cough, shortness of breath or chest pain, joint or back/neck pain, or mood change.  + Bilateral low back pain  Objective:   CT RENAL STONE STUDY Result Date: 12/13/2023 CLINICAL DATA:  Abdominal pain, stone suspected EXAM: CT ABDOMEN AND PELVIS WITHOUT CONTRAST TECHNIQUE: Multidetector CT imaging of the abdomen and pelvis was performed following the standard protocol without IV contrast. RADIATION DOSE REDUCTION: This exam was performed according to the departmental dose-optimization program which includes automated exposure control, adjustment of the mA and/or kV according to patient size and/or use of iterative reconstruction technique. COMPARISON:  11/30/2023 FINDINGS: Lower chest: Trace right pleural effusion. Calcified pleural plaques at the right lung base. Bibasilar atelectasis or scarring. Coronary artery and aortic atherosclerosis. Hepatobiliary: No focal hepatic abnormality. Gallbladder unremarkable. Pancreas: Fatty replacement. No focal abnormality or ductal dilatation. Spleen: No focal abnormality.  Normal size. Adrenals/Urinary Tract: Adrenal glands normal. Punctate bilateral nephrolithiasis. No ureteral stones or  hydronephrosis. Urinary bladder unremarkable. Stomach/Bowel: Sigmoid diverticulosis. No active diverticulitis. Stomach and small bowel decompressed, unremarkable. Vascular/Lymphatic: Severe aortic atherosclerosis. Prior endograft repair of AAA, unchanged. No adenopathy. Reproductive: Prostate enlargement. Other: No free fluid or free air. 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 No acute osseous abnormality. IMPRESSION: Trace right pleural effusion.  Bibasilar scarring or atelectasis. No acute findings in the abdomen or pelvis. Punctate bilateral nephrolithiasis.  No hydronephrosis. Sigmoid diverticulosis.  No active diverticulitis. Prostate enlargement. Electronically Signed   By: Franky Crease M.D.   On: 12/13/2023 12:38   No results for input(s): WBC, HGB, HCT, PLT in the last 72 hours.   No results for input(s): NA, K, CL, CO2, GLUCOSE, BUN, CREATININE, CALCIUM  in the last 72 hours.    Intake/Output Summary (Last 24 hours) at 12/14/2023 1000 Last data filed at 12/14/2023 0719 Gross per 24 hour  Intake 820 ml  Output 200 ml  Net 620 ml        Physical Exam: Vital Signs Blood pressure (!) 140/62, pulse 74, temperature 97.6 F (36.4 C), temperature source Oral, resp. rate 16, height 5' 5 (1.651 m), weight 64 kg, SpO2 96%.  Constitutional: No distress . Vital signs reviewed.  Ambulating in therapy gym. HEENT:  Bruising around right face/eye resolving; EOMI, oral membranes moist Neck: supple Cardiovascular: RRR without murmur. No JVD    Respiratory/Chest: CTA Bilaterally without wheezes or rales. Normal effort    GI/Abdomen: BS +, non-tender, non-distended Ext: no clubbing, cyanosis, or edema Psych: pleasant and cooperative  Skin: Some bruising across right forehead and eye, temple.--Stable + Well-circumscribed dark  lesion on right anterior shin, dry--covered in Mepilex + Skin avulsion of left hand--remains  covered in clean gauze with petroleum barrier--improving 10-23  MSK:      No apparent deformity.      TTP along right greater than left lumbar paraspinals, quadratus lumbar muscles.  Neurologic exam:  Cognition: AAO to person, place, year and month Language: No further appreciable difficulty word finding Memory: Moderate deficits. --Baseline per family. Insight: Fair insight into current condition.  Mood: Pleasant affect, appropriate mood.  Sensation: Equal and intact in BL UE and Les.  Reflexes:Negative Hoffman's and babinski signs bilaterally.  CN: Right ptosis, otherwise grossly intact--improving Coordination: No apparent tremors. No ataxia on FTN, HTS bilaterally.  Spasticity: MAS 0 in all extremities.  Strength: 4-5 left upper and lower extremity, 5-5 right upper and lower extremity   Assessment/Plan: 1. Functional deficits which require 3+ hours per day of interdisciplinary therapy in a comprehensive inpatient rehab setting. Physiatrist is providing close team supervision and 24 hour management of active medical problems listed below. Physiatrist and rehab team continue to assess barriers to discharge/monitor patient progress toward functional and medical goals  Care Tool:  Bathing    Body parts bathed by patient: Right arm, Chest, Abdomen, Right upper leg, Left upper leg, Face, Left arm, Front perineal area, Buttocks, Right lower leg, Left lower leg   Body parts bathed by helper: Right lower leg, Left lower leg, Buttocks     Bathing assist Assist Level: Supervision/Verbal cueing     Upper Body Dressing/Undressing Upper body dressing   What is the patient wearing?: Pull over shirt    Upper body assist Assist Level: Supervision/Verbal cueing    Lower Body Dressing/Undressing Lower body dressing      What is the patient wearing?: Pants, Incontinence brief     Lower body assist Assist for lower body dressing: Minimal Assistance - Patient > 75%      Toileting Toileting    Toileting assist Assist for toileting: Contact Guard/Touching assist     Transfers Chair/bed transfer  Transfers assist  Chair/bed transfer activity did not occur: Safety/medical concerns  Chair/bed transfer assist level: Contact Guard/Touching assist     Locomotion Ambulation   Ambulation assist      Assist level: Minimal Assistance - Patient > 75% Assistive device: Hand held assist Max distance: 12'   Walk 10 feet activity   Assist     Assist level: Minimal Assistance - Patient > 75% Assistive device: No Device, Hand held assist   Walk 50 feet activity   Assist Walk 50 feet with 2 turns activity did not occur: Safety/medical concerns  Assist level: Minimal Assistance - Patient > 75% Assistive device: Hand held assist, No Device    Walk 150 feet activity   Assist Walk 150 feet activity did not occur: Safety/medical concerns  Assist level: Minimal Assistance - Patient > 75% Assistive device: Hand held assist    Walk 10 feet on uneven surface  activity   Assist     Assist level: Minimal Assistance - Patient > 75% Assistive device: Other (comment) (Lt Handrail)   Wheelchair     Assist Is the patient using a wheelchair?: Yes Type of Wheelchair: Manual    Wheelchair assist level: Dependent - Patient 0% Max wheelchair distance: 150'    Wheelchair 50 feet with 2 turns activity    Assist        Assist Level: Dependent - Patient 0%   Wheelchair 150 feet activity  Assist      Assist Level: Dependent - Patient 0%   Blood pressure (!) 140/62, pulse 74, temperature 97.6 F (36.4 C), temperature source Oral, resp. rate 16, height 5' 5 (1.651 m), weight 64 kg, SpO2 96%.  Medical Problem List and Plan: 1. 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  Conservative care per neurosurgery Dr. Alm Molt.             -patient may  shower             -ELOS/Goals: 14-21  days Supervision to min A - 10/28 DC             -Continue CIR therapies including PT, OT, and SLP     - 10/21: SPV to CGA for PT; Min A walking without AD 180 ft; biggest barrier is poor awareness of WB precautions with RUE and balance deficits. OT setup to SPV UB, intermittent Min A LB, needs encouragement for independence. Making excellent progress. At cognitive baseline.   2.  Antithrombotics: -DVT/anticoagulation:  Mechanical: Antiembolism stockings, thigh (TED hose) Bilateral lower extremities             -antiplatelet therapy:  ASPIRIN  AND PLAVIX  resumed 10/17  3. Pain Management: Oxycodone  as needed, Robaxin 500 mg every 8 hours as needed muscle spasms   - No complaints of pain   - 10-16: Bilateral flank pain likely musculoskeletal; discussed with nursing, add aqua thermia, scheduled Robaxin 500 mg 3 times daily   - 10-17: Pain improved with Robaxin, is having more low back pain to today, discussing with nursing getting aquathermia in room  10-20: For right sided headache, added Tylenol  1000 mg 3 times daily and Topamax 25 mg nightly.  If no improvement or if headache quality worsens, recommend repeat CT head.  10-21: Headache improved/resolved with current regimen  10/23: Increase robaxin to 1000 mg TID d/t back pain; heat pack has been the most helpful.  Did discuss findings of CT including multilevel disease of the lumbar spine, likely chronically contributing to back pain  4. Mood/Behavior/Sleep: Provide emotional support             -antipsychotic agents: N/A  - 10-15: Complains of poor sleep, add melatonin 5 mg nightly as needed--scheduled 10-16--improved  10-20: Ongoing poor sleep.  Trazodone 50 mg nightly added.--Significant improvement  5. Neuropsych/cognition: This patient he is capable of making decisions on his own behalf.  6. 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 Fluids/Electrolytes/Nutrition: Routine in and outs with follow-up chemistries   - 10-15: A.m. labs stable, albumin 2.2.  Will get dietary assessment.  8.  Seizure prophylaxis.  Keppra 750 mg twice daily x 7 days total--completed 9.  Nondisplaced right clavicle fracture.  Conservative care per [name] orthopedic surgery.  Nonweightbearing.  Follow-up x-rays 2 weeks (10/23)   - 10/23: Messaged Dr Reyne regarding xray / WB follow up--repeat x-rays today, patient cleared for weightbearing as tolerated  10.  Hypertension.  Cardizem 240 mg daily.  Monitor with increased mobility   - Vitals well-controlled on current regimen    12/14/2023    4:00 AM 12/13/2023    7:42 PM 12/13/2023    4:07 PM  Vitals with BMI  Systolic 140 117 873  Diastolic 62 60 55  Pulse 74 79 86  11.  Hyperlipidemia.  Lipitor 12.  Hospital course hiccups.  Thorazine  10 mg every 4 hours as needed   - No hiccups 10-15 on evaluation 13.  History of popliteal aneurysm status post stenting.  Followed by vascular surgery Dr. Selinda Gu.HOLD ASPIRIN  AND PLAVIX  X ONE WEEK THEN RESUME 14.  History of AAA.  Followed outpatient by Dr. Gu 15.  Constipation.  MiraLAX twice daily, Senokot-S 2 tablets twice daily.   - Large, liquid bowel movement 10-15   - LBM 10/17 - reduced sennakot S to 1 tab BID   - LBM 10/19, medium  10-21: Multiple large bowel movements today.  Move MiraLAX to as needed, DC Senokot S  16. Urinary frequency/dysuria/flank pain:  -UA is +, UCX with 100k GNR  -continue empiric keflex 500mg  bid  -encouraged pt to continue drinking despite frequency  10-20: Culture growing pansensitive E. coli; continue current regimen--continuing with urgency, has condom cath nightly  10-21: Is improving incontinence, remove nighttime catheter to encourage home regimen   10-22:  Start Flomax 0.4 mg nightly. CT renal study ordered for today. Urispas 100 mg TID. Encourage PO  fluids  10/23: No obstructing stone, no s/s infection - DC urospas. Continue flomax d/t enlarged prostate.  May add Proscar and 1 more day if no improvement.   LOS: 9 days A FACE TO FACE EVALUATION WAS PERFORMED  Joesph JAYSON Likes 12/14/2023, 10:00 AM

## 2023-12-14 NOTE — Progress Notes (Signed)
 Occupational Therapy Session Note  Patient Details  Name: Jerry [name] MRN: [id number] Date of Birth: [birth year]  Today's Date: 12/14/2023 OT Individual Time: 9194-9154 OT Individual Time Calculation (min): 40 min   Today's Date: 12/14/2023 OT Individual Time: 8497-8465 OT Individual Time Calculation (min): 32 min   Short Term Goals: Week 2:  OT Short Term Goal 1 (Week 2): STGs=LTGs due to patient's estimated length of stay.  Skilled Therapeutic Interventions/Progress Updates:   Session 1: Pt greeted resting in bed for skilled OT session with focus on functional transfers and BLE strengthening for carryover into ADL transfers/activity tolerance.   Pain: Pt reported 4/10 mid-low back pain, pre-medicated. OT offering intermediate rest breaks and positioning suggestions throughout session to address pain/fatigue and maximize participation/safety in session.   Functional Transfers: Bed mobility with supervision + increased time. Sit<>stands and ambulatory transfers with CGA progressing towards supervision.   Self Care Tasks: No needs voiced this session.   Therapeutic Exercise: In supine on therapy mat, pt instructed in the exercises below: 2x10 SAQ 2x5 glute bridges 2x5 (with 5 sec hold) hip adductions  Pt completes the above with multimodal cuing for correct form/muscular activation.  Pt remained in care of NT, 4Ps assessed and immediate needs met. Pt continues to be appropriate for skilled OT intervention to promote further functional independence in ADLs/IADLs.   Session 2: Pt greeted resting in bed for skilled OT session with focus on RUE ROM/strengthening.   Pain: Pt with intermediate reports of pain RUE/R-side of abdomen. OT offering intermediate rest breaks and positioning suggestions throughout session to address pain/fatigue and maximize participation/safety in session.   Functional Transfers: Ambulatory transfers with CGA progressing to close supervision + no AD.    Therapeutic Exercise: Pt instructed in series of BUE strengthening/ROM exercises as patient now WBAT to RUE, details below: Flexion/extension Abduction/adduction Forward/backward rows Upward/downward rotations  Pt completes 1x10 reps of the above exercises with 1# dowel bar, OT intermediately assisting manually for pain management.   Pt remained resting in bed with 4Ps assessed and immediate needs met. Pt continues to be appropriate for skilled OT intervention to promote further functional independence in ADLs/IADLs.   Therapy Documentation Precautions:  Precautions Precautions: Fall Recall of Precautions/Restrictions: Impaired Precaution/Restrictions Comments: Unaware pf NWB RUE Required Braces or Orthoses: Sling Restrictions Weight Bearing Restrictions Per Provider Order: Yes RUE Weight Bearing Per Provider Order: Non weight bearing   Therapy/Group: Individual Therapy  Nereida Habermann, OTR/L, MSOT  12/14/2023, 7:50 AM

## 2023-12-14 NOTE — Progress Notes (Signed)
 Physical Therapy Weekly Progress Note  Patient Details  Name: Jerry [name] MRN: [id number] Date of Birth: [birth year]  Beginning of progress report period: December 06, 2023 End of progress report period: December 14, 2023  Today's Date: 12/14/2023 PT Individual Time: 1005-1100 + 8694-8584 PT Individual Time Calculation (min): 55 min + 70 min  Patient has met 4 of 4 short term goals.  Pt is making good progress towards functional goals. Pt currently requires min assist to supervision for bed mobility, CGA for transfers and gait without device up to 200'. Pt completes stairs with CGA with unilateral UE support. Pt continues to demonstrate difficulty with maintaining RUE NWB precautions, sling worn for comfort as well as reminder to prevent weightbearing. Pt will require family education prior to DC.  Patient continues to demonstrate the following deficits muscle weakness, decreased cardiorespiratoy endurance, and decreased standing balance, decreased postural control, and decreased balance strategies and therefore will continue to benefit from skilled PT intervention to increase functional independence with mobility.  Patient progressing toward long term goals..  Continue plan of care.  PT Short Term Goals Week 1:  PT Short Term Goal 1 (Week 1): Pt will complete bed mobility with minA. PT Short Term Goal 1 - Progress (Week 1): Progressing toward goal PT Short Term Goal 2 (Week 1): Pt will complete sit to stand with minA. PT Short Term Goal 2 - Progress (Week 1): Progressing toward goal PT Short Term Goal 3 (Week 1): Pt will complete bed to chair transfer with minA. PT Short Term Goal 3 - Progress (Week 1): Progressing toward goal PT Short Term Goal 4 (Week 1): Pt will ambulate x100' with minA and LRAD. PT Short Term Goal 4 - Progress (Week 1): Progressing toward goal Week 2:  PT Short Term Goal 1 (Week 2): STG = LTG due to ELOS  Skilled Therapeutic Interventions/Progress Updates:     SESSION 1: Pt presents in room in bed, agreeable to PT. Pt denies pain, states heating pad has helped significantly for back pain. Session focused on therapeutic activities to promote activity tolerance and pain reduction prior to mobility, and gait training for stair negotiation, and NMR for single limb stability and dynamic standing balance needed for functional ambulation. Pt completes bed mobility with supervision with use of hospital bed rail, increased time but no cues required for sequencing requires cues for weightbearing precautions with RUE. Pt dons shoes with set up assist. Pt completes sit to stands throughout session with supervision. Pt ambulates without device to day room with CGA ~100', comes to sitting on nustep. Pt completes continuous training on nustep L6 x10 min with LUE/BLE as warm up prior to mobility and to decrease pain with mobility. Pt then ambulates without device CGA from day room to main gym, short seated rest break, then pt completes up/down 12 steps with CGA one toe catch with LLE with cues for foot placement LUE support on handrail. Pt then completes NMR stepping over x2 6 hurdles x4 trials forward then laterally with min assist for postural stability, completed to promote single limb stability and BLE coordination needed for gait and transfers. Pt returns to room and remains supine in bed with all needs within reach, cal light in place, kpad in place and bed alarm activated at end of session.    SESSION 2: Pt presents in room seated EOB with pt family present at bediside. Pt DO verbally communicates upgraded weightbearing orders prior to session to RUE WBAT. Pt educated on upgraded  WBAT with pt verbalizing understanding. Pt reporting slight pain in R upper quadrant that pt states comes and goes. Session focused on NMR for dynamic standing balance, BLE coordination and single limb stability. Pt completes sit to stand transfers with CGA throughout session. Pt ambulates with CGA  to day room and comes to sitting on EOM. Pt completes NMR with agility ladder for dynamic functional balance and BLE coordinatoin including: - forward gait (one foot in each square) x2 trials - side stepping x2 trials bilaterally - backwards step to gait - in and outs x2 trials - forward gait with cone tap x2 trials (min assist and mod verbal cues for sequencing) Pt completes gait no device ~400'  with cues for reciprocal arm swing, CGA and cues for R sided attention as pt demonstrating increased proximity to obstacles on R side. Pt reporting increasing back pain at this point, pt completes continuous activity on nustep ~8 min with BUE/BLE on L5 to decrease lower back pain with pt reporting no improvement following activity however denies increase in pain with activity. Pt returns to room ambulating without device with CGA and remains seated EOB handoff to nurse tech at end of session.     Therapy Documentation Precautions:  Precautions Precautions: Fall Recall of Precautions/Restrictions: Impaired Precaution/Restrictions Comments: Unaware pf NWB RUE Required Braces or Orthoses: Sling Restrictions Weight Bearing Restrictions Per Provider Order: Yes RUE Weight Bearing Per Provider Order: Non weight bearing   Therapy/Group: Individual Therapy  Reche Ohara PT, DPT 12/14/2023, 1:05 PM

## 2023-12-15 ENCOUNTER — Other Ambulatory Visit (HOSPITAL_COMMUNITY): Payer: Self-pay

## 2023-12-15 MED ORDER — TAMSULOSIN HCL 0.4 MG PO CAPS
0.8000 mg | ORAL_CAPSULE | Freq: Every day | ORAL | Status: DC
Start: 2023-12-15 — End: 2023-12-15

## 2023-12-15 MED ORDER — TAMSULOSIN HCL 0.4 MG PO CAPS
0.4000 mg | ORAL_CAPSULE | Freq: Every day | ORAL | Status: DC
Start: 2023-12-15 — End: 2023-12-19
  Administered 2023-12-15 – 2023-12-18 (×4): 0.4 mg via ORAL
  Filled 2023-12-15 (×4): qty 1

## 2023-12-15 NOTE — Progress Notes (Signed)
 Nutrition Follow Up  DOCUMENTATION CODES:   Non-severe (moderate) malnutrition in context of social or environmental circumstances  INTERVENTION:  Ensure Plus High Protein po BID, each supplement provides 350 kcal and 20 grams of protein.  Magic cup TID with meals, each supplement provides 290 kcal and 9 grams of protein  Encouraged adequate PO intake; high calorie, high protein  NUTRITION DIAGNOSIS:   Moderate Malnutrition related to social / environmental circumstances as evidenced by moderate muscle depletion, moderate fat depletion, severe muscle depletion. Remains applicable  GOAL:   Patient will meet greater than or equal to 90% of their needs Progressing  MONITOR:   PO intake, Supplement acceptance, Weight trends  REASON FOR ASSESSMENT:   Consult Assessment of nutrition requirement/status  ASSESSMENT:   Pt with hx of HTN, HLD, popliteal aneurysm s/p stenting surgery 2024, and AAA. Recent admission for subdural hematoma resulted from falling to ground. Pt independent and mobile prior to admission. Admitted to CIR for decreased functional mobility.  Spoke with pt who was resting in bed. Pt reports continued good appetite. Average meal intake of 74%. Pt endorses drinking Ensure shakes. No GI discomforts at this time.   Pt happy about progress he has made while in rehab. Encouraged pt to continue efforts and continue to eat well to fuel himself. Will continues to monitor pt's intake and supplement acceptance.    Average Meal Completion: 10/15-10/16: 60% average intake x 3 recorded meals 10/21-10/24: 74% average intake x 8 recorded meals  Medications reviewed  Labs reviewed     Diet Order:   Diet Order             Diet regular Room service appropriate? Yes; Fluid consistency: Thin  Diet effective now                   EDUCATION NEEDS:   Education needs have been addressed  Skin:  Skin Assessment: Reviewed RN Assessment  Last BM:  10/22 type  4  Height:   Ht Readings from Last 1 Encounters:  12/05/23 5' 5 (1.651 m)    Weight:   Wt Readings from Last 1 Encounters:  12/13/23 64 kg    BMI:  Body mass index is 23.48 kg/m.  Estimated Nutritional Needs:   Kcal:  1600-1800  Protein:  65-85g  Fluid:  1.6-1.8L    Josette Glance, MS, RDN, LDN Clinical Dietitian I Please reach out via secure chat

## 2023-12-15 NOTE — Progress Notes (Signed)
 Patient ID: ABHINAV MAYORQUIN, male   DOB: 1928-08-27, 89 y.o.   MRN: [id number]  Therapy team recommends Outpatient PT/OT and 3in1 BSC, TTB, and rollator. SW will discuss with patient and family.   Graeme Jude, MSW, LCSW Office: [phone] Cell: [phone] Fax: [phone]

## 2023-12-15 NOTE — Progress Notes (Signed)
 PROGRESS NOTE   Subjective/Complaints:  No events overnight.  Nursing reported right upper quadrant abdominal pain overnight, the patient denies this, states he has been feeling great last night and this morning.  Participating with therapies, no concerns today. Vitals stable LBM 10/22  ROS: Patient denies fever, rash, sore throat, blurred vision, dizziness, nausea, vomiting, diarrhea, cough, shortness of breath or chest pain, joint or back/neck pain, or mood change.  + Bilateral low back pain--improved  Objective:   DG Shoulder Right Result Date: 12/14/2023 CLINICAL DATA:  Right clavicle fracture, follow-up EXAM: DG SHOULDER 2+V*R* COMPARISON:  Chest x-ray 12/03/2023 FINDINGS: Distal right clavicle fracture again noted, unchanged since prior chest x-ray. No subluxation or dislocation. Postoperative changes at the right lung base. IMPRESSION: Stable appearance of the distal right clavicle fracture. Electronically Signed   By: Franky Crease M.D.   On: 12/14/2023 17:37   CT RENAL STONE STUDY Result Date: 12/13/2023 CLINICAL DATA:  Abdominal pain, stone suspected EXAM: CT ABDOMEN AND PELVIS WITHOUT CONTRAST TECHNIQUE: Multidetector CT imaging of the abdomen and pelvis was performed following the standard protocol without IV contrast. RADIATION DOSE REDUCTION: This exam was performed according to the departmental dose-optimization program which includes automated exposure control, adjustment of the mA and/or kV according to patient size and/or use of iterative reconstruction technique. COMPARISON:  11/30/2023 FINDINGS: Lower chest: Trace right pleural effusion. Calcified pleural plaques at the right lung base. Bibasilar atelectasis or scarring. Coronary artery and aortic atherosclerosis. Hepatobiliary: No focal hepatic abnormality. Gallbladder unremarkable. Pancreas: Fatty replacement. No focal abnormality or ductal dilatation. Spleen: No  focal abnormality.  Normal size. Adrenals/Urinary Tract: Adrenal glands normal. Punctate bilateral nephrolithiasis. No ureteral stones or hydronephrosis. Urinary bladder unremarkable. Stomach/Bowel: Sigmoid diverticulosis. No active diverticulitis. Stomach and small bowel decompressed, unremarkable. Vascular/Lymphatic: Severe aortic atherosclerosis. Prior endograft repair of AAA, unchanged. No adenopathy. Reproductive: Prostate enlargement. Other: No free fluid or free air. 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 No acute osseous abnormality. IMPRESSION: Trace right pleural effusion.  Bibasilar scarring or atelectasis. No acute findings in the abdomen or pelvis. Punctate bilateral nephrolithiasis.  No hydronephrosis. Sigmoid diverticulosis.  No active diverticulitis. Prostate enlargement. Electronically Signed   By: Franky Crease M.D.   On: 12/13/2023 12:38   No results for input(s): WBC, HGB, HCT, PLT in the last 72 hours.   No results for input(s): NA, K, CL, CO2, GLUCOSE, BUN, CREATININE, CALCIUM  in the last 72 hours.    Intake/Output Summary (Last 24 hours) at 12/15/2023 0959 Last data filed at 12/15/2023 0420 Gross per 24 hour  Intake 120 ml  Output 350 ml  Net -230 ml        Physical Exam: Vital Signs Blood pressure 111/61, pulse 68, temperature (!) 97.5 F (36.4 C), resp. rate 17, height 5' 5 (1.651 m), weight 64 kg, SpO2 100%.  Constitutional: No distress . Vital signs reviewed.  Sitting up in therapy gym. HEENT:  Bruising around right face/eye resolving; EOMI, oral membranes moist Neck: supple Cardiovascular: RRR without murmur. No JVD    Respiratory/Chest: CTA Bilaterally without wheezes or rales. Normal effort    GI/Abdomen: BS +, non-tender, non-distended Ext: no clubbing,  cyanosis, or edema Psych: pleasant and cooperative  Skin: Some bruising across right forehead and eye, temple.--Resolving +  Well-circumscribed dark lesion on right anterior shin, dry--covered in Mepilex + Skin avulsion of left hand--remains covered in clean gauze with petroleum barrier--clean, well-circumscribed edges and beefy red base.  Healing well 10-24  MSK:      No apparent deformity.       Neurologic exam:  Cognition: AAO to person, place, year and month Language: No further appreciable difficulty word finding Memory: Moderate deficits. --Baseline per family. Insight: Fair insight into current condition.  Mood: Pleasant affect, appropriate mood.  Sensation: Equal and intact in BL UE and Les.  Reflexes: Negative Hoffman's and babinski signs bilaterally.  Bilateral upper extremity reflexes 2+ CN: Right ptosis, otherwise grossly intact--improving Coordination: No apparent tremors or ataxia Strength: 5- -5 left upper and lower extremity, 5-5 right upper and lower extremity   Assessment/Plan: 1. Functional deficits which require 3+ hours per day of interdisciplinary therapy in a comprehensive inpatient rehab setting. Physiatrist is providing close team supervision and 24 hour management of active medical problems listed below. Physiatrist and rehab team continue to assess barriers to discharge/monitor patient progress toward functional and medical goals  Care Tool:  Bathing    Body parts bathed by patient: Right arm, Chest, Abdomen, Right upper leg, Left upper leg, Face, Left arm, Front perineal area, Buttocks, Right lower leg, Left lower leg   Body parts bathed by helper: Right lower leg, Left lower leg, Buttocks     Bathing assist Assist Level: Supervision/Verbal cueing     Upper Body Dressing/Undressing Upper body dressing   What is the patient wearing?: Pull over shirt    Upper body assist Assist Level: Supervision/Verbal cueing    Lower Body Dressing/Undressing Lower body dressing      What is the patient wearing?: Pants, Incontinence brief     Lower body assist Assist for lower  body dressing: Minimal Assistance - Patient > 75%     Toileting Toileting    Toileting assist Assist for toileting: Contact Guard/Touching assist     Transfers Chair/bed transfer  Transfers assist  Chair/bed transfer activity did not occur: Safety/medical concerns  Chair/bed transfer assist level: Contact Guard/Touching assist     Locomotion Ambulation   Ambulation assist      Assist level: Contact Guard/Touching assist Assistive device: No Device Max distance: 400'   Walk 10 feet activity   Assist     Assist level: Contact Guard/Touching assist Assistive device: No Device   Walk 50 feet activity   Assist Walk 50 feet with 2 turns activity did not occur: Safety/medical concerns  Assist level: Contact Guard/Touching assist Assistive device: No Device    Walk 150 feet activity   Assist Walk 150 feet activity did not occur: Safety/medical concerns  Assist level: Contact Guard/Touching assist Assistive device: No Device    Walk 10 feet on uneven surface  activity   Assist     Assist level: Minimal Assistance - Patient > 75% Assistive device: Other (comment) (Lt Handrail)   Wheelchair     Assist Is the patient using a wheelchair?: Yes Type of Wheelchair: Manual    Wheelchair assist level: Dependent - Patient 0% Max wheelchair distance: 150'    Wheelchair 50 feet with 2 turns activity    Assist        Assist Level: Dependent - Patient 0%   Wheelchair 150 feet activity     Assist  Assist Level: Dependent - Patient 0%   Blood pressure 111/61, pulse 68, temperature (!) 97.5 F (36.4 C), resp. rate 17, height 5' 5 (1.651 m), weight 64 kg, SpO2 100%.  Medical Problem List and Plan: 1. 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  Conservative care per neurosurgery Dr. Alm Molt.             -patient may  shower             -ELOS/Goals: 14-21 days Supervision to min A - 10/28 DC              -Continue CIR therapies including PT, OT, and SLP     - 10/21: SPV to CGA for PT; Min A walking without AD 180 ft; biggest barrier is poor awareness of WB precautions with RUE and balance deficits. OT setup to SPV UB, intermittent Min A LB, needs encouragement for independence. Making excellent progress. At cognitive baseline.   2.  Antithrombotics: -DVT/anticoagulation:  Mechanical: Antiembolism stockings, thigh (TED hose) Bilateral lower extremities             -antiplatelet therapy:  ASPIRIN  AND PLAVIX  resumed 10/17  3. Pain Management: Oxycodone  as needed, Robaxin 500 mg every 8 hours as needed muscle spasms   - No complaints of pain   - 10-16: Bilateral flank pain likely musculoskeletal; discussed with nursing, add aqua thermia, scheduled Robaxin 500 mg 3 times daily   - 10-17: Pain improved with Robaxin, is having more low back pain to today, discussing with nursing getting aquathermia in room  10-20: For right sided headache, added Tylenol  1000 mg 3 times daily and Topamax 25 mg nightly.  If no improvement or if headache quality worsens, recommend repeat CT head.  10-21: Headache improved/resolved with current regimen  10/23: Increase robaxin to 1000 mg TID d/t back pain; heat pack has been the most helpful.  Did discuss findings of CT including multilevel disease of the lumbar spine, likely chronically contributing to back pain  - 10-24: Pain well-controlled per patient  4. Mood/Behavior/Sleep: Provide emotional support             -antipsychotic agents: N/A  - 10-15: Complains of poor sleep, add melatonin 5 mg nightly as needed--scheduled 10-16--improved  10-20: Ongoing poor sleep.  Trazodone 50 mg nightly added.--Significant improvement  5. Neuropsych/cognition: This patient he is capable of making decisions on his own behalf.  6. 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 Fluids/Electrolytes/Nutrition: Routine in and outs with follow-up chemistries   - 10-15: A.m. labs stable, albumin 2.2.  Will get dietary assessment.  8.  Seizure prophylaxis.  Keppra 750 mg twice daily x 7 days total--completed 9.  Nondisplaced right clavicle fracture.  Conservative care per [name] orthopedic surgery.  Nonweightbearing.  Follow-up x-rays 2 weeks (10/23)   - 10/23: Messaged Dr Reyne regarding xray / WB follow up--repeat x-rays today, patient cleared for weightbearing as tolerated  10.  Hypertension.  Cardizem 240 mg daily.  Monitor with increased mobility   - Vitals well-controlled on current regimen    12/15/2023    6:36 AM 12/14/2023    7:41 PM 12/14/2023    2:16 PM  Vitals with BMI  Systolic 111 130 876  Diastolic 61 60 62  Pulse 68 83 91    11.  Hyperlipidemia.  Lipitor 12.  Hospital course hiccups.  Thorazine  10 mg every 4 hours as needed   - No hiccups 10-15 on evaluation 13.  History of popliteal aneurysm status post stenting.  Followed by vascular surgery Dr. Selinda Gu.HOLD ASPIRIN  AND PLAVIX  X ONE WEEK THEN RESUME 14.  History of AAA.  Followed outpatient by Dr. Gu 15.  Constipation.  MiraLAX twice daily, Senokot-S 2 tablets twice daily.   - Large, liquid bowel movement 10-15   - LBM 10/17 - reduced sennakot S to 1 tab BID   - LBM 10/19, medium  10-21: Multiple large bowel movements today.  Move MiraLAX to as needed, DC Senokot S  - 10-22 LBM 16. Urinary frequency/dysuria/flank pain:  -UA is +, UCX with 100k GNR  -continue empiric keflex 500mg  bid  -encouraged pt to continue drinking despite frequency  10-20: Culture growing pansensitive E. coli; continue current regimen--continuing with urgency, has condom cath nightly  10-21: Is improving incontinence, remove nighttime catheter to encourage home regimen   10-22:  Start Flomax 0.4 mg nightly. CT renal study ordered for today. Urispas 100 mg TID.  Encourage PO fluids  10/23: No obstructing stone, no s/s infection - DC urospas. Continue flomax d/t enlarged prostate.  May add Proscar and 1 more day if no improvement.  10-24: Patient with ongoing urinary frequency at nighttime, but no complaints today.  Continue current regimen  LOS: 10 days A FACE TO FACE EVALUATION WAS PERFORMED  Jerry [name] Likes 12/15/2023, 9:59 AM

## 2023-12-15 NOTE — Progress Notes (Signed)
 Physical Therapy Session Note  Patient Details  Name: Jerry [name] MRN: [id number] Date of Birth: 11-05-1928  Today's Date: 12/15/2023 PT Individual Time: 1105-1202 + [id number] PT Individual Time Calculation (min): 57 min +  Short Term Goals: Week 2:  PT Short Term Goal 1 (Week 2): STG = LTG due to ELOS  Skilled Therapeutic Interventions/Progress Updates:    SESSION 1: Pt presents in room in [facility], agreeable to PT. Pt denies pain but states that L hand was hurting this morning during use. Session focused on therapeutic activities to facilitate good hygiene practices and wound care as well as gait training with various assistive devices and NMR for dynamic standing balance and BLE coordination. Pt completes gait training with rollator, cues for locking device and upright posture with ambulation, completes on unit throughout session with supervision, good postural stability noted however demo forward trunk lean and shuffling gait. Pt comes to sitting in arm chair in main gym, therapist redresses L hand due to gauze and tape coming off. RN notified to redress with medication during session. Pt completes gait without device with cues for BUE arm swing during gait, completes with CGA. Pt completes NMR with agility ladder for dynamic functional balance and BLE coordinatoin including: - forward gait (one foot in each square) x4 trials - side stepping x2 trials bilaterally - backwards step to gait x2 trials Pt returns to room and remains seated in [facility] with all needs within reach, cal light in place and pt wife at bedside at end of session.    SESSION 2: Pt presents in room seated in WC, agreeable to PT. Pt does not report pain during session. Session focused on NMR for dynamic standing, BUE/BLE coordination, visual tracking, reactive balance. Pt completes transfers without RW with CGA/close supervision. Pt ambulates without device with CGA/close supervision to day room ~125' and comes to sitting  on EOM. Pt then completes NMR with self ball toss including: - seated, standing two hand toss/catch x30 each - standing marching alternating BLE with two hand toss/catch x20 - walking 150' two hand toss/catch - seated, standing toss between hands x30 each - standing marching alternating BLE with toss between hands x20 - walking 170' toss between BUEs Pt returns to room ambulating with close supervision/CGA, ambulates into bathroom and completes toilet transfer with supervision. Pt completes 3/3 toileting tasks with supervision, continent of bladder, charted. Pt returns to supine in bed where he remains with all needs within reach, cal light in place and bed alarm activated at end of session.     Therapy Documentation Precautions:  Precautions Precautions: Fall Recall of Precautions/Restrictions: Impaired Precaution/Restrictions Comments: Unaware pf NWB RUE Required Braces or Orthoses: Sling Restrictions Weight Bearing Restrictions Per Provider Order: Yes RUE Weight Bearing Per Provider Order: Non weight bearing    Therapy/Group: Individual Therapy  Reche Ohara PT, DPT 12/15/2023, 1:33 PM

## 2023-12-16 MED ORDER — DICLOFENAC SODIUM 1 % EX GEL
2.0000 g | Freq: Four times a day (QID) | CUTANEOUS | Status: DC
  Administered 2023-12-16 – 2023-12-18 (×8): 2 g via TOPICAL
  Filled 2023-12-16: qty 100

## 2023-12-16 MED ORDER — TOPIRAMATE 25 MG PO TABS: 25.0000 mg | ORAL_TABLET | Freq: Two times a day (BID) | ORAL | Status: DC | PRN

## 2023-12-16 MED ORDER — LIDOCAINE 5 % EX PTCH
2.0000 | MEDICATED_PATCH | CUTANEOUS | Status: DC
  Administered 2023-12-16 – 2023-12-18 (×3): 2 via TRANSDERMAL
  Filled 2023-12-16 (×3): qty 2

## 2023-12-16 MED ORDER — CYCLOBENZAPRINE HCL 5 MG PO TABS
5.0000 mg | ORAL_TABLET | Freq: Three times a day (TID) | ORAL | Status: DC
  Administered 2023-12-16 – 2023-12-19 (×8): 5 mg via ORAL
  Filled 2023-12-16 (×8): qty 1

## 2023-12-16 NOTE — Progress Notes (Signed)
 Physical Therapy Session Note  Patient Details  Name: Jerry [name] MRN: [id number] Date of Birth: 02-Feb-1929  Today's Date: 12/16/2023 PT Individual Time: 1021-1045 PT Individual Time Calculation (min): 24 min   Short Term Goals: Week 2:  PT Short Term Goal 1 (Week 2): STG = LTG due to ELOS  Skilled Therapeutic Interventions/Progress Updates:    Pt presents in room, initially declining therapy due to pain in low back. Pt agreeable to therapist trialing therex in bed to assist with pain as well as provide repositioning in supine to assist with pain. Pt states pain is 7/10 in low back, premedicated. Pt completes lower trunk rotations x20, hamstring stretch BLE x60 second each, and glute set 2 hold x10. Pt provided with HEP with LTR and glute sets to complete throughout day as tolerated to decrease pain. Session concluded early secondary to pt back pain with pt remaining semi reclined in bed with knees elevated, kpad in place, all needs within reach, call light in place, bed alarm activated and pt wife at bed side at end of session.  Therapy Documentation Precautions:  Precautions Precautions: Fall Recall of Precautions/Restrictions: Impaired Precaution/Restrictions Comments: Unaware pf NWB RUE Required Braces or Orthoses: Sling Restrictions Weight Bearing Restrictions Per Provider Order: Yes RUE Weight Bearing Per Provider Order: Non weight bearing   Therapy/Group: Individual Therapy  Reche Ohara PT, DPT 12/16/2023, 10:59 AM

## 2023-12-16 NOTE — Plan of Care (Signed)
  Problem: RH BOWEL ELIMINATION Goal: RH STG MANAGE BOWEL WITH ASSISTANCE Description: STG Manage Bowel with mod I Assistance. Outcome: Progressing Goal: RH STG MANAGE BOWEL W/MEDICATION W/ASSISTANCE Description: STG Manage Bowel with Medication with mod I  Assistance. Outcome: Progressing   Problem: RH BLADDER ELIMINATION Goal: RH STG MANAGE BLADDER WITH ASSISTANCE Description: STG Manage Bladder With toileting Assistance Outcome: Progressing   Problem: RH SAFETY Goal: RH STG ADHERE TO SAFETY PRECAUTIONS W/ASSISTANCE/DEVICE Description: STG Adhere to Safety Precautions With cues Assistance/Device. Outcome: Progressing   Problem: RH COGNITION-NURSING Goal: RH STG USES MEMORY AIDS/STRATEGIES W/ASSIST TO PROBLEM SOLVE Description: STG Uses Memory Aids/Strategies With cues Assistance to Problem Solve. Outcome: Progressing   Problem: RH COGNITION-NURSING Goal: RH STG USES MEMORY AIDS/STRATEGIES W/ASSIST TO PROBLEM SOLVE Description: STG Uses Memory Aids/Strategies With cues Assistance to Problem Solve. Outcome: Progressing   Problem: RH PAIN MANAGEMENT Goal: RH STG PAIN MANAGED AT OR BELOW PT'S PAIN GOAL Description: Pain < 4 with prns Outcome: Progressing

## 2023-12-16 NOTE — Progress Notes (Signed)
 Physical Therapy Discharge Summary  Patient Details  Name: Jerry [name] MRN: [id number] Date of Birth: [birth year]  Date of Discharge from PT service:December 18, 2023  {CHL IP REHAB PT TIME CALCULATION:[id number]}   Patient has met 9 of 9 long term goals due to improved activity tolerance, improved balance, improved postural control, increased strength, functional use of  right upper extremity, and improved coordination.  Patient to discharge at an ambulatory level Supervision.   Patient's care partner is independent to provide the necessary physical assistance at discharge.  Reasons goals not met: N/A  Recommendation:  Patient will benefit from ongoing skilled PT services in home health setting to continue to advance safe functional mobility, address ongoing impairments in gait mechanics, dynamic standing balance, and minimize fall risk.  Equipment: rollator  Reasons for discharge: treatment goals met and discharge from hospital  Patient/family agrees with progress made and goals achieved: Yes  PT Discharge Precautions/Restrictions Precautions Precautions: Fall Recall of Precautions/Restrictions: Impaired Restrictions Weight Bearing Restrictions Per Provider Order: Yes RUE Weight Bearing Per Provider Order: Weight bearing as tolerated Pain Interference Pain Interference Pain Effect on Sleep: 3. Frequently Pain Interference with Therapy Activities: 3. Frequently Pain Interference with Day-to-Day Activities: 3. Frequently Cognition Overall Cognitive Status: Within Functional Limits for tasks assessed Arousal/Alertness: Awake/alert Orientation Level: Oriented X4 Sensation Sensation Light Touch: Appears Intact Hot/Cold: Not tested Proprioception: Not tested Stereognosis: Not tested Coordination Gross Motor Movements are Fluid and Coordinated: No Fine Motor Movements are Fluid and Coordinated: No Coordination and Movement Description: coordination impaired secondary to  generalized weakness Motor  Motor Motor: Other (comment) Motor - Discharge Observations: Generalized weakness/debility, improved from eval  Mobility Bed Mobility Bed Mobility: Supine to Sit;Sit to Supine Supine to Sit: Supervision/Verbal cueing Sit to Supine: Supervision/Verbal cueing Transfers Transfers: Sit to Stand;Stand to Sit;Stand Pivot Transfers Sit to Stand: Supervision/Verbal cueing Stand to Sit: Supervision/Verbal cueing Stand Pivot Transfers: Supervision/Verbal cueing Stand Pivot Transfer Details: Verbal cues for safe use of DME/AE Transfer (Assistive device): Rollator Locomotion  Gait Ambulation: Yes Gait Assistance: Supervision/Verbal cueing Gait Distance (Feet): 300 Feet Assistive device: Rollator Gait Assistance Details: Verbal cues for gait pattern;Verbal cues for precautions/safety;Verbal cues for technique;Tactile cues for posture;Tactile cues for weight shifting;Tactile cues for sequencing Gait Gait: Yes Gait Pattern: Impaired Gait Pattern: Trunk flexed;Decreased stride length  Trunk/Postural Assessment  Cervical Assessment Cervical Assessment: Exceptions to [facility] (forward head) Thoracic Assessment Thoracic Assessment: Exceptions to [facility] (rounded shoulders) Lumbar Assessment Lumbar Assessment: Exceptions to [facility] (posterior pelvic tilt) Postural Control Postural Control: Deficits on evaluation Righting Reactions: delayed Protective Responses: inadequate  Balance Balance Balance Assessed: Yes Static Sitting Balance Static Sitting - Balance Support: Feet supported;Bilateral upper extremity supported Static Sitting - Level of Assistance: 6: Modified independent (Device/Increase time) Dynamic Sitting Balance Dynamic Sitting - Balance Support: During functional activity Dynamic Sitting - Level of Assistance: 6: Modified independent (Device/Increase time) Static Standing Balance Static Standing - Balance Support: No upper extremity supported;During functional  activity Static Standing - Level of Assistance: 5: Stand by assistance Dynamic Standing Balance Dynamic Standing - Balance Support: Bilateral upper extremity supported;During functional activity Dynamic Standing - Level of Assistance: 5: Stand by assistance Extremity Assessment  RLE Assessment RLE Assessment: Exceptions to [facility] General Strength Comments: Grossly 4/5, endurance impaired LLE Assessment LLE Assessment: Exceptions to [facility] General Strength Comments: Grossly 3+/5, endurance impaired   Reche Ohara PT, DPT 12/16/2023, 4:09 PM

## 2023-12-16 NOTE — Progress Notes (Signed)
 Occupational Therapy Session Note  Patient Details  Name: Jerry [name] MRN: [id number] Date of Birth: December 24, 1928  Today's Date: 12/16/2023 OT Individual Time: 1401-1505 OT Individual Time Calculation (min): 64 min  and Today's Date: 12/16/2023 OT Missed Time: 11 Minutes Missed Time Reason: Pain   Short Term Goals: Week 1:  OT Short Term Goal 1 (Week 1): Pt will perform toilet transfer with Min A + LRAD. OT Short Term Goal 1 - Progress (Week 1): Met OT Short Term Goal 2 (Week 1): Pt will perform 1/3 toileting tasks with Min A for standing balance. OT Short Term Goal 2 - Progress (Week 1): Met OT Short Term Goal 3 (Week 1): Pt will thread LB garments with Min A + LRAD. OT Short Term Goal 3 - Progress (Week 1): Met  Skilled Therapeutic Interventions/Progress Updates:   Patient agreeable to participate in OT session. Reports 7/10 pain level. Patient reported increased pain and initially hesitant for therapy. OT utilized therapeutic use of self to promote participation. Patient completed toileting with OT with CGA. Patient then able to complete. Reported to nursing pain who applied lidocaine  patch. Patient then completed functional mobility with cane CGA and bathing at sink in wc with CGA to min A. Patient able to wash all parts of body however speed and balance limited by painful condition of lower back. Patient completed UB dressing SU, LB dressing SU, with pants and breif. Patient then returned to bed to complete UE exercises to decrease painful condition of UE. Patient educated on importance of scapular retractions, upright posture to decrease midback pain. OT facilitated improved posture and positioning, while patient completed UE rows 2x10 each side with red theraband. Alarm placed on all needs left in reach. Session ended early due to patient pain and fatigue.     Therapy Documentation Precautions:  Precautions Precautions: Fall Recall of Precautions/Restrictions:  Impaired Precaution/Restrictions Comments: Unaware pf NWB RUE Required Braces or Orthoses: Sling Restrictions Weight Bearing Restrictions Per Provider Order: Yes RUE Weight Bearing Per Provider Order: Non weight bearing  Therapy/Group: Individual Therapy  D'mariea L Sabel Hornbeck 12/16/2023, 7:56 AM

## 2023-12-16 NOTE — Progress Notes (Signed)
 PROGRESS NOTE   Subjective/Complaints: No events overnight.  Complaining of severe back pain/spasms today.  Similar to prior exams, although it had been gone the day before.  He did have a very good/busy day of therapy yesterday.  Vitals stable     12/16/2023    5:27 AM 12/15/2023    8:50 PM 12/15/2023    2:05 PM  Vitals with BMI  Systolic 120 116 888  Diastolic 54 58 53  Pulse 68 78 81    No results for input(s): GLUCAP in the last 72 hours.   P.o. intakes appropriate  Continent of bladder  Last BM 10/24   ROS: Patient denies fever, rash, sore throat, blurred vision, dizziness, nausea, vomiting, diarrhea, cough, shortness of breath or chest pain, joint or back/neck pain, or mood change.  + Bilateral low back pain--waxes and wanes depending on activity  Objective:   DG Shoulder Right Result Date: 12/14/2023 CLINICAL DATA:  Right clavicle fracture, follow-up EXAM: DG SHOULDER 2+V*R* COMPARISON:  Chest x-ray 12/03/2023 FINDINGS: Distal right clavicle fracture again noted, unchanged since prior chest x-ray. No subluxation or dislocation. Postoperative changes at the right lung base. IMPRESSION: Stable appearance of the distal right clavicle fracture. Electronically Signed   By: Franky Crease M.D.   On: 12/14/2023 17:37   No results for input(s): WBC, HGB, HCT, PLT in the last 72 hours.   No results for input(s): NA, K, CL, CO2, GLUCOSE, BUN, CREATININE, CALCIUM  in the last 72 hours.    Intake/Output Summary (Last 24 hours) at 12/16/2023 0929 Last data filed at 12/15/2023 1843 Gross per 24 hour  Intake 356 ml  Output --  Net 356 ml        Physical Exam: Vital Signs Blood pressure (!) 120/54, pulse 68, temperature 97.6 F (36.4 C), temperature source Oral, resp. rate 16, height 5' 5 (1.651 m), weight 64 kg, SpO2 98%.  Constitutional: No distress . Vital signs reviewed.  Laying in  bed HEENT:  Bruising around right face/eye resolving; EOMI, oral membranes moist Neck: supple Cardiovascular: RRR without murmur. No JVD    Respiratory/Chest: CTA Bilaterally without wheezes or rales. Normal effort    GI/Abdomen: BS +, non-tender, non-distended Ext: no clubbing, cyanosis, or edema Psych: pleasant and cooperative  Skin: Some bruising across right forehead and eye, temple.--Resolving + Well-circumscribed dark lesion on right anterior shin, dry--covered in Mepilex + Skin avulsion of left hand--remains covered in clean gauze with petroleum barrier--clean, well-circumscribed edges and beefy red base.  Healing well    MSK:      No apparent deformity.   + TTP bilateral quadratus lumborum muscles; no TTP thoracic or lumbar paraspinals or spinous processes.  No apparent deformity.      Neurologic exam:  Cognition: AAO to person, place, year and month Language: No further appreciable difficulty word finding Memory: Moderate deficits. --Baseline per family. Insight: Fair insight into current condition.  Mood: Pleasant affect, appropriate mood.  Sensation: Equal and intact in BL UE and Les.  Reflexes: Negative Hoffman's and babinski signs bilaterally.  Bilateral upper extremity reflexes 2+ CN: Right ptosis, otherwise grossly intact--improving Coordination: No apparent tremors or ataxia Strength: 5- -5 left upper  and lower extremity, 5-5 right upper and lower extremity   Assessment/Plan: 1. Functional deficits which require 3+ hours per day of interdisciplinary therapy in a comprehensive inpatient rehab setting. Physiatrist is providing close team supervision and 24 hour management of active medical problems listed below. Physiatrist and rehab team continue to assess barriers to discharge/monitor patient progress toward functional and medical goals  Care Tool:  Bathing    Body parts bathed by patient: Right arm, Chest, Abdomen, Right upper leg, Left upper leg, Face, Left  arm, Front perineal area, Buttocks, Right lower leg, Left lower leg   Body parts bathed by helper: Right lower leg, Left lower leg, Buttocks     Bathing assist Assist Level: Supervision/Verbal cueing     Upper Body Dressing/Undressing Upper body dressing   What is the patient wearing?: Pull over shirt    Upper body assist Assist Level: Supervision/Verbal cueing    Lower Body Dressing/Undressing Lower body dressing      What is the patient wearing?: Pants, Incontinence brief     Lower body assist Assist for lower body dressing: Minimal Assistance - Patient > 75%     Toileting Toileting    Toileting assist Assist for toileting: Contact Guard/Touching assist     Transfers Chair/bed transfer  Transfers assist  Chair/bed transfer activity did not occur: Safety/medical concerns  Chair/bed transfer assist level: Contact Guard/Touching assist     Locomotion Ambulation   Ambulation assist      Assist level: Contact Guard/Touching assist Assistive device: No Device Max distance: 400'   Walk 10 feet activity   Assist     Assist level: Contact Guard/Touching assist Assistive device: No Device   Walk 50 feet activity   Assist Walk 50 feet with 2 turns activity did not occur: Safety/medical concerns  Assist level: Contact Guard/Touching assist Assistive device: No Device    Walk 150 feet activity   Assist Walk 150 feet activity did not occur: Safety/medical concerns  Assist level: Contact Guard/Touching assist Assistive device: No Device    Walk 10 feet on uneven surface  activity   Assist     Assist level: Minimal Assistance - Patient > 75% Assistive device: Other (comment) (Lt Handrail)   Wheelchair     Assist Is the patient using a wheelchair?: Yes Type of Wheelchair: Manual    Wheelchair assist level: Dependent - Patient 0% Max wheelchair distance: 150'    Wheelchair 50 feet with 2 turns activity    Assist         Assist Level: Dependent - Patient 0%   Wheelchair 150 feet activity     Assist      Assist Level: Dependent - Patient 0%   Blood pressure (!) 120/54, pulse 68, temperature 97.6 F (36.4 C), temperature source Oral, resp. rate 16, height 5' 5 (1.651 m), weight 64 kg, SpO2 98%.  Medical Problem List and Plan: 1. 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  Conservative care per neurosurgery Dr. Alm Molt.             -patient may  shower             -ELOS/Goals: 14-21 days Supervision to min A - 10/28 DC             -Continue CIR therapies including PT, OT, and SLP     - 10/21: SPV to CGA for PT; Min A walking without AD 180 ft; biggest barrier is poor awareness of WB precautions with  RUE and balance deficits. OT setup to SPV UB, intermittent Min A LB, needs encouragement for independence. Making excellent progress. At cognitive baseline.   2.  Antithrombotics: -DVT/anticoagulation:  Mechanical: Antiembolism stockings, thigh (TED hose) Bilateral lower extremities             -antiplatelet therapy:  ASPIRIN  AND PLAVIX  resumed 10/17  3. Pain Management: Oxycodone  as needed, Robaxin 500 mg every 8 hours as needed muscle spasms   - No complaints of pain   - 10-16: Bilateral flank pain likely musculoskeletal; discussed with nursing, add aqua thermia, scheduled Robaxin 500 mg 3 times daily   - 10-17: Pain improved with Robaxin, is having more low back pain to today, discussing with nursing getting aquathermia in room  10-20: For right sided headache, added Tylenol  1000 mg 3 times daily and Topamax 25 mg nightly.  If no improvement or if headache quality worsens, recommend repeat CT head.  10-21: Headache improved/resolved with current regimen  10/23: Increase robaxin to 1000 mg TID d/t back pain; heat pack has been the most helpful.  Did discuss findings of CT including multilevel disease of the lumbar spine, likely chronically contributing to back  pain  - 10-24: Pain well-controlled per patient  - 10-25: Back pain/spasms back, seems to be worsened after busy days of therapy.  Change Robaxin to Flexeril 5 mg 3 times daily.  4. Mood/Behavior/Sleep: Provide emotional support             -antipsychotic agents: N/A  - 10-15: Complains of poor sleep, add melatonin 5 mg nightly as needed--scheduled 10-16--improved  10-20: Ongoing poor sleep.  Trazodone 50 mg nightly added.--Significant improvement  5. Neuropsych/cognition: This patient he is capable of making decisions on his own behalf.  6. 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 Fluids/Electrolytes/Nutrition: Routine in and outs with follow-up chemistries   - 10-15: A.m. labs stable, albumin 2.2.  Will get dietary assessment.  8.  Seizure prophylaxis.  Keppra 750 mg twice daily x 7 days total--completed 9.  Nondisplaced right clavicle fracture.  Conservative care per [name] orthopedic surgery.  Nonweightbearing.  Follow-up x-rays 2 weeks (10/23)   - 10/23: Messaged Dr Reyne regarding xray / WB follow up--repeat x-rays today, patient cleared for weightbearing as tolerated  - X-ray showing stable clavicular fracture  10.  Hypertension.  Cardizem 240 mg daily.  Monitor with increased mobility   - Vitals well-controlled on current regimen    12/16/2023    5:27 AM 12/15/2023    8:50 PM 12/15/2023    2:05 PM  Vitals with BMI  Systolic 120 116 888  Diastolic 54 58 53  Pulse 68 78 81    11.  Hyperlipidemia.  Lipitor 12.  Hospital course hiccups.  Thorazine  10 mg every 4 hours as needed   - No hiccups 10-15 on evaluation--DC Thorazine  13.  History of popliteal aneurysm status post stenting.  Followed by vascular surgery Dr. Selinda Gu. HOLD ASPIRIN  AND PLAVIX  X ONE WEEK THEN RESUME 14.  History of AAA.  Followed outpatient by Dr.  Gu 15.  Constipation.  MiraLAX twice daily, Senokot-S 2 tablets twice daily.   - Large, liquid bowel movement 10-15   - LBM 10/17 - reduced sennakot S to 1 tab BID   - LBM 10/19, medium  10-21: Multiple large bowel movements today.  Move MiraLAX to as needed, DC Senokot S  - 10-22 LBM  16. Urinary frequency/dysuria/flank pain:  -UA is +, UCX with 100k GNR  -continue empiric keflex 500mg  bid  -encouraged pt to continue drinking despite frequency  10-20: Culture growing pansensitive E. coli; continue current regimen--continuing with urgency, has condom cath nightly  10-21: Is improving incontinence, remove nighttime catheter to encourage home regimen   10-22:  Start Flomax 0.4 mg nightly. CT renal study ordered for today. Urispas 100 mg TID. Encourage PO fluids  10/23: No obstructing stone, no s/s infection - DC urospas. Continue flomax d/t enlarged prostate.  May add Proscar and 1 more day if no improvement.  10-24: Patient with ongoing urinary frequency at nighttime, but no complaints today.  Continue current regimen  LOS: 11 days A FACE TO FACE EVALUATION WAS PERFORMED  Joesph JAYSON Likes 12/16/2023, 9:29 AM

## 2023-12-16 NOTE — Plan of Care (Signed)
  Problem: Consults Goal: RH BRAIN INJURY PATIENT EDUCATION Description: Description: See Patient Education module for eduction specifics Outcome: Progressing   Problem: RH BOWEL ELIMINATION Goal: RH STG MANAGE BOWEL WITH ASSISTANCE Description: STG Manage Bowel with mod I Assistance. Outcome: Progressing Goal: RH STG MANAGE BOWEL W/MEDICATION W/ASSISTANCE Description: STG Manage Bowel with Medication with mod I  Assistance. Outcome: Progressing   Problem: RH BLADDER ELIMINATION Goal: RH STG MANAGE BLADDER WITH ASSISTANCE Description: STG Manage Bladder With toileting Assistance Outcome: Progressing   Problem: RH SAFETY Goal: RH STG ADHERE TO SAFETY PRECAUTIONS W/ASSISTANCE/DEVICE Description: STG Adhere to Safety Precautions With cues Assistance/Device. Outcome: Progressing   Problem: RH COGNITION-NURSING Goal: RH STG USES MEMORY AIDS/STRATEGIES W/ASSIST TO PROBLEM SOLVE Description: STG Uses Memory Aids/Strategies With cues Assistance to Problem Solve. Outcome: Progressing   Problem: RH PAIN MANAGEMENT Goal: RH STG PAIN MANAGED AT OR BELOW PT'S PAIN GOAL Description: Pain < 4 with prns Outcome: Progressing   Problem: RH KNOWLEDGE DEFICIT BRAIN INJURY Goal: RH STG INCREASE KNOWLEDGE OF SELF CARE AFTER BRAIN INJURY Description: Patient and spouse will be able to manage care using educational resources for medications and dietary modification, etc, independently Outcome: Progressing

## 2023-12-17 ENCOUNTER — Other Ambulatory Visit (HOSPITAL_COMMUNITY): Payer: Self-pay

## 2023-12-17 MED ORDER — POLYETHYLENE GLYCOL 3350 17 G PO PACK
17.0000 g | PACK | Freq: Once | ORAL | Status: DC
  Filled 2023-12-17: qty 1

## 2023-12-17 MED ORDER — SENNOSIDES-DOCUSATE SODIUM 8.6-50 MG PO TABS
1.0000 | ORAL_TABLET | Freq: Every day | ORAL | Status: DC
  Administered 2023-12-17: 1 via ORAL
  Filled 2023-12-17: qty 1

## 2023-12-17 NOTE — Plan of Care (Signed)
  Problem: Consults Goal: RH BRAIN INJURY PATIENT EDUCATION Description: Description: See Patient Education module for eduction specifics Outcome: Progressing   Problem: RH BOWEL ELIMINATION Goal: RH STG MANAGE BOWEL WITH ASSISTANCE Description: STG Manage Bowel with mod I Assistance. Outcome: Progressing Goal: RH STG MANAGE BOWEL W/MEDICATION W/ASSISTANCE Description: STG Manage Bowel with Medication with mod I  Assistance. Outcome: Progressing   Problem: RH BLADDER ELIMINATION Goal: RH STG MANAGE BLADDER WITH ASSISTANCE Description: STG Manage Bladder With toileting Assistance Outcome: Progressing   Problem: RH SAFETY Goal: RH STG ADHERE TO SAFETY PRECAUTIONS W/ASSISTANCE/DEVICE Description: STG Adhere to Safety Precautions With cues Assistance/Device. Outcome: Progressing   Problem: RH COGNITION-NURSING Goal: RH STG USES MEMORY AIDS/STRATEGIES W/ASSIST TO PROBLEM SOLVE Description: STG Uses Memory Aids/Strategies With cues Assistance to Problem Solve. Outcome: Progressing   Problem: RH PAIN MANAGEMENT Goal: RH STG PAIN MANAGED AT OR BELOW PT'S PAIN GOAL Description: Pain < 4 with prns Outcome: Progressing   Problem: RH KNOWLEDGE DEFICIT BRAIN INJURY Goal: RH STG INCREASE KNOWLEDGE OF SELF CARE AFTER BRAIN INJURY Description: Patient and spouse will be able to manage care using educational resources for medications and dietary modification, etc, independently Outcome: Progressing

## 2023-12-17 NOTE — Progress Notes (Signed)
 PROGRESS NOTE   Subjective/Complaints: No events overnight.  Back pain much improved today compared to yesterday. Vital stable.  No complaints.  ROS: Patient denies fever, rash, sore throat, blurred vision, dizziness, nausea, vomiting, diarrhea, cough, shortness of breath or chest pain, joint or back/neck pain, or mood change.  + Bilateral low back pain--waxes and wanes depending on activity  Objective:   No results found.  No results for input(s): WBC, HGB, HCT, PLT in the last 72 hours.   No results for input(s): NA, K, CL, CO2, GLUCOSE, BUN, CREATININE, CALCIUM  in the last 72 hours.    Intake/Output Summary (Last 24 hours) at 12/17/2023 1040 Last data filed at 12/17/2023 0800 Gross per 24 hour  Intake 720 ml  Output --  Net 720 ml        Physical Exam: Vital Signs Blood pressure 136/63, pulse 69, temperature 97.6 F (36.4 C), temperature source Oral, resp. rate 20, height 5' 5 (1.651 m), weight 64 kg, SpO2 100%.  Constitutional: No distress . Vital signs reviewed.  Laying in bed HEENT:  Bruising around right face/eye resolving; EOMI, oral membranes moist Neck: supple Cardiovascular: RRR without murmur. No JVD    Respiratory/Chest: CTA Bilaterally without wheezes or rales. Normal effort    GI/Abdomen: BS +, non-tender, non-distended Ext: no clubbing, cyanosis, or edema Psych: pleasant and cooperative  Skin: Some bruising across right forehead and eye, temple.--Resolving + Large bruising/discoloration of right shoulder.   + Well-circumscribed dark lesion on right anterior shin, dry--covered in Mepilex + Skin avulsion of left hand--remains covered in clean gauze with petroleum barrier--clean, well-circumscribed edges and beefy red base.  Healing well    MSK:      No apparent deformity.   + TTP bilateral quadratus lumborum muscles,  TTP thoracic or lumbar paraspinals      Neurologic  exam:  Cognition: AAO to person, place, year and month Language: No further appreciable difficulty word finding Memory: Moderate deficits. --Baseline per family. Insight: Fair insight into current condition.  Mood: Pleasant affect, appropriate mood.  Sensation: Equal and intact in BL UE and Les.  Reflexes: Negative Hoffman's and babinski signs bilaterally.  Bilateral upper extremity reflexes 2+ CN: Right ptosis, otherwise grossly intact--improving Coordination: No apparent tremors or ataxia Strength: 5- -5 left upper and lower extremity, 5-5 right upper and lower extremity   Assessment/Plan: 1. Functional deficits which require 3+ hours per day of interdisciplinary therapy in a comprehensive inpatient rehab setting. Physiatrist is providing close team supervision and 24 hour management of active medical problems listed below. Physiatrist and rehab team continue to assess barriers to discharge/monitor patient progress toward functional and medical goals  Care Tool:  Bathing    Body parts bathed by patient: Right arm, Chest, Abdomen, Right upper leg, Left upper leg, Face, Left arm, Front perineal area, Buttocks, Right lower leg, Left lower leg   Body parts bathed by helper: Right lower leg, Left lower leg, Buttocks     Bathing assist Assist Level: Supervision/Verbal cueing     Upper Body Dressing/Undressing Upper body dressing   What is the patient wearing?: Pull over shirt    Upper body assist Assist Level: Supervision/Verbal cueing  Lower Body Dressing/Undressing Lower body dressing      What is the patient wearing?: Pants, Incontinence brief     Lower body assist Assist for lower body dressing: Minimal Assistance - Patient > 75%     Toileting Toileting    Toileting assist Assist for toileting: Contact Guard/Touching assist     Transfers Chair/bed transfer  Transfers assist  Chair/bed transfer activity did not occur: Safety/medical concerns  Chair/bed  transfer assist level: Supervision/Verbal cueing     Locomotion Ambulation   Ambulation assist      Assist level: Supervision/Verbal cueing Assistive device: Rollator Max distance: 400'   Walk 10 feet activity   Assist     Assist level: Supervision/Verbal cueing Assistive device: Rollator   Walk 50 feet activity   Assist Walk 50 feet with 2 turns activity did not occur: Safety/medical concerns  Assist level: Supervision/Verbal cueing Assistive device: Rollator    Walk 150 feet activity   Assist Walk 150 feet activity did not occur: Safety/medical concerns  Assist level: Supervision/Verbal cueing Assistive device: Rollator    Walk 10 feet on uneven surface  activity   Assist     Assist level: Supervision/Verbal cueing Assistive device: Rollator   Wheelchair     Assist Is the patient using a wheelchair?: No (ambulates on unit) Type of Wheelchair: Manual    Wheelchair assist level: Dependent - Patient 0% Max wheelchair distance: 150'    Wheelchair 50 feet with 2 turns activity    Assist        Assist Level: Dependent - Patient 0%   Wheelchair 150 feet activity     Assist      Assist Level: Dependent - Patient 0%   Blood pressure 136/63, pulse 69, temperature 97.6 F (36.4 C), temperature source Oral, resp. rate 20, height 5' 5 (1.651 m), weight 64 kg, SpO2 100%.  Medical Problem List and Plan: 1. 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  Conservative care per neurosurgery Dr. Alm Molt.             -patient may  shower             -ELOS/Goals: 14-21 days Supervision to min A - 10/28 DC             -Continue CIR therapies including PT, OT, and SLP     - 10/21: SPV to CGA for PT; Min A walking without AD 180 ft; biggest barrier is poor awareness of WB precautions with RUE and balance deficits. OT setup to SPV UB, intermittent Min A LB, needs encouragement for independence. Making excellent  progress. At cognitive baseline.   2.  Antithrombotics: -DVT/anticoagulation:  Mechanical: Antiembolism stockings, thigh (TED hose) Bilateral lower extremities             -antiplatelet therapy:  ASPIRIN  AND PLAVIX  resumed 10/17  3. Pain Management: Oxycodone  as needed, Robaxin 500 mg every 8 hours as needed muscle spasms   - No complaints of pain   - 10-16: Bilateral flank pain likely musculoskeletal; discussed with nursing, add aqua thermia, scheduled Robaxin 500 mg 3 times daily   - 10-17: Pain improved with Robaxin, is having more low back pain to today, discussing with nursing getting aquathermia in room  10-20: For right sided headache, added Tylenol  1000 mg 3 times daily and Topamax 25 mg nightly.  If no improvement or if headache quality worsens, recommend repeat CT head.  10-21: Headache improved/resolved with current regimen  10/23: Increase robaxin  to 1000 mg TID d/t back pain; heat pack has been the most helpful.  Did discuss findings of CT including multilevel disease of the lumbar spine, likely chronically contributing to back pain  - 10-24: Pain well-controlled per patient  - 10-25: Back pain/spasms back, seems to be worsened after busy days of therapy.  Change Robaxin to Flexeril 5 mg 3 times daily.--Better controlled, truly seems to wax and wane depending on his activity level the day prior.  4. Mood/Behavior/Sleep: Provide emotional support             -antipsychotic agents: N/A  - 10-15: Complains of poor sleep, add melatonin 5 mg nightly as needed--scheduled 10-16--improved  10-20: Ongoing poor sleep.  Trazodone 50 mg nightly added.--Significant improvement  5. Neuropsych/cognition: This patient he is capable of making decisions on his own behalf.  6. 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 Fluids/Electrolytes/Nutrition:  Routine in and outs with follow-up chemistries   - 10-15: A.m. labs stable, albumin 2.2.  Will get dietary assessment.  8.  Seizure prophylaxis.  Keppra 750 mg twice daily x 7 days total--completed 9.  Nondisplaced right clavicle fracture.  Conservative care per [name] orthopedic surgery.  Nonweightbearing.  Follow-up x-rays 2 weeks (10/23)   - 10/23: Messaged Dr Reyne regarding xray / WB follow up--repeat x-rays today, patient cleared for weightbearing as tolerated  - X-ray showing stable clavicular fracture  10.  Hypertension.  Cardizem 240 mg daily.  Monitor with increased mobility   - Vitals well-controlled on current regimen    12/17/2023    5:45 AM 12/16/2023    1:26 PM 12/16/2023    5:27 AM  Vitals with BMI  Systolic 136 105 879  Diastolic 63 62 54  Pulse 69 74 68    11.  Hyperlipidemia.  Lipitor 12.  Hospital course hiccups.  Thorazine  10 mg every 4 hours as needed   - No hiccups 10-15 on evaluation--DC Thorazine  13.  History of popliteal aneurysm status post stenting.  Followed by vascular surgery Dr. Selinda Gu. HOLD ASPIRIN  AND PLAVIX  X ONE WEEK THEN RESUME 14.  History of AAA.  Followed outpatient by Dr. Gu 15.  Constipation.  MiraLAX twice daily, Senokot-S 2 tablets twice daily.   - Large, liquid bowel movement 10-15   - LBM 10/17 - reduced sennakot S to 1 tab BID   - LBM 10/19, medium  10-21: Multiple large bowel movements today.  Move MiraLAX to as needed, DC Senokot S  - 10-22 LBM   - 10/26: Smear BM; will give 1x dose miralax, resume sennakot S qhs  16. Urinary frequency/dysuria/flank pain:  -UA is +, UCX with 100k GNR  -continue empiric keflex 500mg  bid  -encouraged pt to continue drinking despite frequency  10-20: Culture growing pansensitive E. coli; continue current regimen--continuing with urgency, has condom cath nightly  10-21: Is improving incontinence, remove nighttime catheter to encourage home regimen   10-22:  Start Flomax 0.4 mg nightly. CT  renal study ordered for today. Urispas 100 mg TID. Encourage PO fluids  10/23: No obstructing stone, no s/s infection - DC urospas. Continue flomax d/t enlarged prostate.  May add Proscar and 1 more day if no improvement.  10-24: Patient with ongoing urinary frequency at nighttime, but no complaints today.  Continue current regimen  LOS: 12 days A FACE  TO FACE EVALUATION WAS PERFORMED  Jerry [name] Likes 12/17/2023, 10:40 AM

## 2023-12-17 NOTE — Progress Notes (Signed)
 Physical Therapy Session Note  Patient Details  Name: Jerry [name] MRN: [id number] Date of Birth: 06-10-1928  Today's Date: 12/17/2023 PT Individual Time: 1115-1200 PT Individual Time Calculation (min): 45 min   Short Term Goals: Week 1:  PT Short Term Goal 1 (Week 1): Pt will complete bed mobility with minA. PT Short Term Goal 1 - Progress (Week 1): Progressing toward goal PT Short Term Goal 2 (Week 1): Pt will complete sit to stand with minA. PT Short Term Goal 2 - Progress (Week 1): Progressing toward goal PT Short Term Goal 3 (Week 1): Pt will complete bed to chair transfer with minA. PT Short Term Goal 3 - Progress (Week 1): Progressing toward goal PT Short Term Goal 4 (Week 1): Pt will ambulate x100' with minA and LRAD. PT Short Term Goal 4 - Progress (Week 1): Progressing toward goal  Skilled Therapeutic Interventions/Progress Updates:  Pt was seen bedside in the am with family at bedside. Pt transferred to edge of bed with increased time and S. Pt transferred sit to stand from edge of bed with rollator and S with verbal cues for safety. Pt ambulated 150 feet with rollator and S to rehab gym. In gym treatment focused on LE strengthening with cone taps and alternating cone taps, 3 sets  10 reps each. Pt performed multiple transfers sit to stand with rollator and S. Pt ambulated through slalom course about 60 feet with rollator and S to contact guard with verbal cues. Pt returned to room with rollator and S. Pt performed toilet transfers with S and verbal cues. Pt returned to edge of bed with rollator and S with verbal cues for safety. Pt transferred edge of bed to supine with S. Pt left sitting up in bed with bed alarm and family at bedside.   Therapy Documentation Precautions:  Precautions Precautions: Fall Recall of Precautions/Restrictions: Impaired Precaution/Restrictions Comments: Unaware pf NWB RUE Required Braces or Orthoses: Sling Restrictions Weight Bearing  Restrictions Per Provider Order: Yes RUE Weight Bearing Per Provider Order: Weight bearing as tolerated General:   Pain: No c/o pain   Therapy/Group: Individual Therapy  [name] 12/17/2023, 12:06 PM

## 2023-12-17 NOTE — Progress Notes (Signed)
 Physical Therapy Session Note  Patient Details  Name: Jerry [name] MRN: [id number] Date of Birth: 02-Aug-1928  Today's Date: 12/17/2023 PT Individual Time: 1000-1110 PT Individual Time Calculation (min): 70 min   Short Term Goals: Week 2:  PT Short Term Goal 1 (Week 2): STG = LTG due to ELOS  Skilled Therapeutic Interventions/Progress Updates:      Pt in bed with daughter and wife at bedside. Pt has no reports of pain, in agreement to therapy treatment.   Focused session on initiating family education and training: *reviewed recommendation of using rollator for ambulation.  *family purchased x2 bed rails on [employer] on Sunday.  *family purchased suction-cup grab bars for bathroom  *Family asking for a BSC to place over their toilet.  *family reports walk in shower is too small to fit a shower chair? No other shower available on 1st floor  *Family to purchase a rollator on their own so that they can have x2 rollators to keep 1 in the car and 1 in the home.   Pt completes bed mobility with supervision. Donned socks and shoes with assist for time management.   Sit<>stand to rollator with supervision assist. Reviewed safety features and general functions of the rollator with both patient and family. Teachback method used to ensure understanding.   Pt ambulates with supervision assist with the rollator in hallway distances, > 327ft.   In ADL apartment, reviewed bed mobility on flat bed with the use of slid in bed rail. Pt completes bed mobility at supervision level with cues for safety only.   Practiced car transfers with car height simulating their personal vehicle. Completed with supervision and rollator. Instructed wife in rollator management with folding and lifting to place into vehicle.   Reviewed stair training to simulate home entrance. Spent time problem solving home entrance with rollator management. Wife attempted to carry the folded rollator up the stairs but needed assist  from PT for safety. Also discussed trying to roll the rollator up the stairs but this was also unsafe. Ultimately, decided it would be best for family to have x2 rollators to keep 1 in the car and 1 in the home to limit carrying up/down the stairs. Pt completed stair climbing 2x12 with supervision and 2 hand rails. Emphasized step to pattern vs alternating pattern for added safety.   Finished session with Nustep with L3 resistance for x10 minutes. Completed 697 steps and 0.39 miles in total.  Pt returned to his room and he was left sitting EOB with family present. All needs met, family appreciative of educational session.   Therapy Documentation Precautions:  Precautions Precautions: Fall Recall of Precautions/Restrictions: Impaired Precaution/Restrictions Comments: Unaware pf NWB RUE Required Braces or Orthoses: Sling Restrictions Weight Bearing Restrictions Per Provider Order: Yes RUE Weight Bearing Per Provider Order: Weight bearing as tolerated General:     Therapy/Group: Individual Therapy  Eufelia Veno P Mikel Hardgrove 12/17/2023, 7:14 AM

## 2023-12-18 ENCOUNTER — Telehealth: Payer: Self-pay

## 2023-12-18 ENCOUNTER — Other Ambulatory Visit (HOSPITAL_COMMUNITY): Payer: Self-pay

## 2023-12-18 DIAGNOSIS — K5900 Constipation, unspecified: Secondary | ICD-10-CM

## 2023-12-18 DIAGNOSIS — Z558 Other problems related to education and literacy: Secondary | ICD-10-CM

## 2023-12-18 DIAGNOSIS — G8929 Other chronic pain: Secondary | ICD-10-CM

## 2023-12-18 DIAGNOSIS — M545 Low back pain, unspecified: Secondary | ICD-10-CM

## 2023-12-18 MED ORDER — SORBITOL 70 % SOLN: 30.0000 mL | Freq: Every day | Status: DC | PRN

## 2023-12-18 MED ORDER — SENNOSIDES-DOCUSATE SODIUM 8.6-50 MG PO TABS: 1.0000 | ORAL_TABLET | Freq: Every day | ORAL | Status: DC

## 2023-12-18 NOTE — Progress Notes (Signed)
 Occupational Therapy Discharge Summary  Patient Details  Name: Jerry [name] MRN: [id number] Date of Birth: [birth year]  Date of Discharge from OT service:December 18, 2023  Today's Date: 12/18/2023 OT Individual Time: 1005-1045 OT Individual Time Calculation (min): 40 min   Today's Date: 12/18/2023 OT Individual Time: 1420-1530 OT Individual Time Calculation (min): 70 min    Patient has met 9 of 9 long term goals due to improved activity tolerance, improved balance, postural control, ability to compensate for deficits, functional use of  RIGHT upper extremity, improved attention, and improved awareness.  Patient to discharge at overall Supervision level.  Patient's care partner is independent to provide the necessary cognitive assistance at discharge.    Reasons goals not met: NA  Recommendation:  Patient will benefit from ongoing skilled OT services in home health setting to continue to advance functional skills in the area of BADL and Reduce care partner burden.  Equipment: Rollator and BSC  Reasons for discharge: treatment goals met and discharge from hospital  Patient/family agrees with progress made and goals achieved: Yes  OT Discharge Precautions/Restrictions  Precautions Precautions: Fall Recall of Precautions/Restrictions: Impaired Restrictions Weight Bearing Restrictions Per Provider Order: Yes RUE Weight Bearing Per Provider Order: Weight bearing as tolerated Pain Pain Assessment Pain Scale: 0-10 Pain Score: 0-No pain ADL ADL Eating: Independent Where Assessed-Eating: Wheelchair, Edge of bed Grooming: Modified independent Where Assessed-Grooming: Sitting at sink Upper Body Bathing: Modified independent Where Assessed-Upper Body Bathing: Shower Lower Body Bathing: Supervision/safety Where Assessed-Lower Body Bathing: Shower Upper Body Dressing: Modified independent (Device) Where Assessed-Upper Body Dressing: Edge of bed Lower Body Dressing:  Supervision/safety Where Assessed-Lower Body Dressing: Edge of bed Toileting: Supervision/safety Where Assessed-Toileting: Teacher, Adult Education: Close supervision Toilet Transfer Method: Proofreader: Gaffer: Not assessed Film/video Editor: Close supervision Film/video Editor Method: Designer, Industrial/product: Sales Promotion Account Executive Baseline Vision/History: 1 Wears glasses Patient Visual Report: No change from baseline Vision Assessment?: Wears glasses for reading;Wears glasses for driving Perception  Perception: Within Functional Limits Praxis Praxis: WFL Cognition Cognition Overall Cognitive Status: Within Functional Limits for tasks assessed Arousal/Alertness: Awake/alert Brief Interview for Mental Status (BIMS) Repetition of Three Words (First Attempt): 3 Temporal Orientation: Year: Correct Temporal Orientation: Month: Accurate within 5 days Temporal Orientation: Day: Correct Recall: Sock: Yes, no cue required Recall: Blue: Yes, no cue required Recall: Bed: Yes, after cueing (a piece of furniture) BIMS Summary Score: 14 Sensation Sensation Light Touch: Appears Intact Hot/Cold: Appears Intact Coordination Gross Motor Movements are Fluid and Coordinated: No Fine Motor Movements are Fluid and Coordinated: Yes Coordination and Movement Description: Deficits due to generalized weakness. Motor  Motor Motor: Other (comment) Motor - Discharge Observations: Generalized weakness/debility, improved from eval. Mobility  Transfers Sit to Stand: Supervision/Verbal cueing Stand to Sit: Supervision/Verbal cueing  Trunk/Postural Assessment  Cervical Assessment Cervical Assessment: Exceptions to [facility] (forward head) Thoracic Assessment Thoracic Assessment: Exceptions to [facility] (rounded shoulders) Lumbar Assessment Lumbar Assessment: Exceptions to [employer] (posterior pelvic tilt) Postural  Control Postural Control: Deficits on evaluation Righting Reactions: delayed Protective Responses: inadequate  Balance Balance Balance Assessed: Yes Standardized Balance Assessment Standardized Balance Assessment: Berg Balance Test;Timed Up and Go Test Berg Balance Test Sit to Stand: Able to stand  independently using hands Standing Unsupported: Able to stand safely 2 minutes Sitting with Back Unsupported but Feet Supported on Floor or Stool: Able to sit safely and securely 2 minutes Stand to Sit: Sits safely with minimal use of hands Transfers: Able  to transfer safely, definite need of hands Standing Unsupported with Eyes Closed: Able to stand 10 seconds with supervision Standing Ubsupported with Feet Together: Able to place feet together independently and stand for 1 minute with supervision From Standing, Reach Forward with Outstretched Arm: Reaches forward but needs supervision From Standing Position, Pick up Object from Floor: Able to pick up shoe, needs supervision From Standing Position, Turn to Look Behind Over each Shoulder: Looks behind one side only/other side shows less weight shift Turn 360 Degrees: Able to turn 360 degrees safely but slowly Standing Unsupported, Alternately Place Feet on Step/Stool: Able to stand independently and complete 8 steps >20 seconds Standing Unsupported, One Foot in Front: Able to plae foot ahead of the other independently and hold 30 seconds Standing on One Leg: Tries to lift leg/unable to hold 3 seconds but remains standing independently Total Score: 40 Timed Up and Go Test TUG: Normal TUG (with rollator) Normal TUG (seconds): 13.3 Static Sitting Balance Static Sitting - Balance Support: Feet supported;Bilateral upper extremity supported Static Sitting - Level of Assistance: 6: Modified independent (Device/Increase time) Dynamic Sitting Balance Dynamic Sitting - Balance Support: During functional activity Dynamic Sitting - Level of  Assistance: 6: Modified independent (Device/Increase time) Static Standing Balance Static Standing - Balance Support: During functional activity Static Standing - Level of Assistance: 5: Stand by assistance (Supervision) Dynamic Standing Balance Dynamic Standing - Balance Support: Bilateral upper extremity supported;During functional activity Dynamic Standing - Level of Assistance: 5: Stand by assistance (Supervision) Extremity/Trunk Assessment RUE Assessment RUE Assessment: Exceptions to [facility] Active Range of Motion (AROM) Comments: Stiffness/mild pain with ROM General Strength Comments: 3-/5 LUE Assessment LUE Assessment: Within Functional Limits   Skilled Intervention  Session 1:  Pt greeted resting in bed, no reports of pain. Functional transfers and more than household level ambulation at supervision level with use of rollator, min cuing for safe proximity of AD. Simulated walk-in shower transfer with use of threshold and backward-stepping technique, CGA + rollator intially due to newness of transfer progressing to supervision + rollator. Back in patient room, transfer performed in presence of daughter and spouse. Daughter reports having already purchased shower chair and suction-cup grab bars. Education provided on placement for grab bars, handouts given for hand-held shower head kit and non-skid strips to decrease fall risk within shower environment. Both receptive of education. Pt remained sitting in WC with all immediate needs met.   Session 2:  Pt greeted resting in bed, no reports of pain, receptive to bathing at shower level. Bed mobility with supervision + use of bed rail. Ambulatory shower transfer with supervision + rollator. OT waterproofs LUE dressing (just changed by RN). Handout provided for waterproofing glove upon discharge home. Patient bathes/dresses with levels of assistance noted above. Increased time required to attempt BM with minimal success. Increased time/effort  given by patient to perform pericare due to continuous smearing of stool, OT ultimately providing Min A due to fatigue. Pt remained resting in bed with all immediate needs met, spouse at bedside.   Nereida Habermann, OTR/L, MSOT  12/18/2023, 3:21 PM

## 2023-12-18 NOTE — Progress Notes (Signed)
 PROGRESS NOTE   Subjective/Complaints: Pain controlled. LBM yesterday, feels like he may need to have another soon.   ROS: Patient denies fever, new skin changes, blurred vision, dizziness, nausea, vomiting, diarrhea, cough, shortness of breath or chest pain, joint or back/neck pain, or mood change.  + Bilateral low back pain--waxes and wanes depending on activity  Objective:   No results found.  No results for input(s): WBC, HGB, HCT, PLT in the last 72 hours.   No results for input(s): NA, K, CL, CO2, GLUCOSE, BUN, CREATININE, CALCIUM  in the last 72 hours.    Intake/Output Summary (Last 24 hours) at 12/18/2023 1010 Last data filed at 12/18/2023 0316 Gross per 24 hour  Intake 476 ml  Output 450 ml  Net 26 ml        Physical Exam: Vital Signs Blood pressure (!) 112/53, pulse 72, temperature (!) 97.5 F (36.4 C), resp. rate 18, height 5' 5 (1.651 m), weight 64 kg, SpO2 98%.  Constitutional: No distress . Vital signs reviewed.  Laying in bed. Appears comfortable HEENT:  Bruising around right face/eye resolving; EOMI, oral membranes moist Neck: supple Cardiovascular: RRR without murmur. No JVD    Respiratory/Chest: CTA Bilaterally without wheezes or rales. Normal effort    GI/Abdomen: BS +, non-tender, non-distended Ext: no clubbing, cyanosis, or edema Psych: pleasant and cooperative  Skin: Some bruising across right forehead and eye, temple.--Resolving + Large bruising/discoloration of right shoulder.   + Well-circumscribed dark lesion on right anterior shin, dry--covered in Mepilex + Skin avulsion of left hand--remains covered in clean gauze with petroleum barrier--clean, well-circumscribed edges and beefy red base.  Healing well    MSK:      No apparent deformity.   + TTP bilateral quadratus lumborum muscles,  TTP thoracic or lumbar paraspinals      Neurologic exam:  Cognition: AAO  to person, place, year and month Language: No further appreciable difficulty word finding Memory: Moderate deficits. --Baseline per family. Insight: Fair insight into current condition.  Mood: Pleasant affect, appropriate mood.  Sensation: Equal and intact in BL UE and Les.  Reflexes: Negative Hoffman's and babinski signs bilaterally.  Bilateral upper extremity reflexes 2+ CN: Right ptosis, otherwise grossly intact--improving Coordination: No apparent tremors or ataxia Strength: 5- -5 left upper and lower extremity, 5-5 right upper and lower extremity  Prior neuro assessment is c/w today's exam 12/18/2023.   Assessment/Plan: 1. Functional deficits which require 3+ hours per day of interdisciplinary therapy in a comprehensive inpatient rehab setting. Physiatrist is providing close team supervision and 24 hour management of active medical problems listed below. Physiatrist and rehab team continue to assess barriers to discharge/monitor patient progress toward functional and medical goals  Care Tool:  Bathing    Body parts bathed by patient: Right arm, Chest, Abdomen, Right upper leg, Left upper leg, Face, Left arm, Front perineal area, Buttocks, Right lower leg, Left lower leg   Body parts bathed by helper: Right lower leg, Left lower leg, Buttocks     Bathing assist Assist Level: Supervision/Verbal cueing     Upper Body Dressing/Undressing Upper body dressing   What is the patient wearing?: Pull over shirt  Upper body assist Assist Level: Supervision/Verbal cueing    Lower Body Dressing/Undressing Lower body dressing      What is the patient wearing?: Pants, Incontinence brief     Lower body assist Assist for lower body dressing: Minimal Assistance - Patient > 75%     Toileting Toileting    Toileting assist Assist for toileting: Contact Guard/Touching assist     Transfers Chair/bed transfer  Transfers assist  Chair/bed transfer activity did not occur:  Safety/medical concerns  Chair/bed transfer assist level: Supervision/Verbal cueing     Locomotion Ambulation   Ambulation assist      Assist level: Supervision/Verbal cueing Assistive device: Rollator Max distance: 150   Walk 10 feet activity   Assist     Assist level: Supervision/Verbal cueing Assistive device: Rollator   Walk 50 feet activity   Assist Walk 50 feet with 2 turns activity did not occur: Safety/medical concerns  Assist level: Supervision/Verbal cueing Assistive device: Rollator    Walk 150 feet activity   Assist Walk 150 feet activity did not occur: Safety/medical concerns  Assist level: Supervision/Verbal cueing Assistive device: Rollator    Walk 10 feet on uneven surface  activity   Assist     Assist level: Supervision/Verbal cueing Assistive device: Rollator   Wheelchair     Assist Is the patient using a wheelchair?: No (ambulates on unit) Type of Wheelchair: Manual    Wheelchair assist level: Dependent - Patient 0% Max wheelchair distance: 150'    Wheelchair 50 feet with 2 turns activity    Assist        Assist Level: Dependent - Patient 0%   Wheelchair 150 feet activity     Assist      Assist Level: Dependent - Patient 0%   Blood pressure (!) 112/53, pulse 72, temperature (!) 97.5 F (36.4 C), resp. rate 18, height 5' 5 (1.651 m), weight 64 kg, SpO2 98%.  Medical Problem List and Plan: 1. 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  Conservative care per neurosurgery Dr. Alm Molt.             -patient may  shower             -ELOS/Goals: 14-21 days Supervision to min A - 10/28 DC             -Continue CIR therapies including PT, OT, and SLP     - 10/21: SPV to CGA for PT; Min A walking without AD 180 ft; biggest barrier is poor awareness of WB precautions with RUE and balance deficits. OT setup to SPV UB, intermittent Min A LB, needs encouragement for independence.  Making excellent progress. At cognitive baseline.   -Expected DC tomorrow 10/28   2.  Antithrombotics: -DVT/anticoagulation:  Mechanical: Antiembolism stockings, thigh (TED hose) Bilateral lower extremities             -antiplatelet therapy:  ASPIRIN  AND PLAVIX  resumed 10/17  3. Pain Management: Oxycodone  as needed, Robaxin 500 mg every 8 hours as needed muscle spasms   - No complaints of pain   - 10-16: Bilateral flank pain likely musculoskeletal; discussed with nursing, add aqua thermia, scheduled Robaxin 500 mg 3 times daily   - 10-17: Pain improved with Robaxin, is having more low back pain to today, discussing with nursing getting aquathermia in room  10-20: For right sided headache, added Tylenol  1000 mg 3 times daily and Topamax 25 mg nightly.  If no improvement or if headache quality worsens,  recommend repeat CT head.  10-21: Headache improved/resolved with current regimen  10/23: Increase robaxin to 1000 mg TID d/t back pain; heat pack has been the most helpful.  Did discuss findings of CT including multilevel disease of the lumbar spine, likely chronically contributing to back pain  - 10-24: Pain well-controlled per patient  - 10-25: Back pain/spasms back, seems to be worsened after busy days of therapy.  Change Robaxin to Flexeril 5 mg 3 times daily.--Better controlled, truly seems to wax and wane depending on his activity level the day prior.  -10/27 reports pain controlled today, continue current regimen  4. Mood/Behavior/Sleep: Provide emotional support             -antipsychotic agents: N/A  - 10-15: Complains of poor sleep, add melatonin 5 mg nightly as needed--scheduled 10-16--improved  10-20: Ongoing poor sleep.  Trazodone 50 mg nightly added.--Significant improvement  5. Neuropsych/cognition: This patient he is capable of making decisions on his own behalf.  6. Skin/Wound Care: Routine skin checks   - 10-16: Informed nursing to keep right lateral squamous cell carcinoma  comfort and Mepilex   - 10/21: DC condom cath now that he is continent  -Will ask nursing to provide wound care teaching  7. Fluids/Electrolytes/Nutrition: Routine in and outs with follow-up chemistries   - 10-15: A.m. labs stable, albumin 2.2.  Will get dietary assessment.  8.  Seizure prophylaxis.  Keppra 750 mg twice daily x 7 days total--completed 9.  Nondisplaced right clavicle fracture.  Conservative care per [name] orthopedic surgery.  Nonweightbearing.  Follow-up x-rays 2 weeks (10/23)   - 10/23: Messaged Dr Reyne regarding xray / WB follow up--repeat x-rays today, patient cleared for weightbearing as tolerated  - X-ray showing stable clavicular fracture  10.  Hypertension.  Cardizem 240 mg daily.  Monitor with increased mobility   - Vitals well-controlled on current regimen  -10/27 controlled overall, continue current and monitor    12/18/2023    3:15 AM 12/17/2023    7:22 PM 12/17/2023    1:18 PM  Vitals with BMI  Systolic 112 114 875  Diastolic 53 59 67  Pulse 72 79 84    11.  Hyperlipidemia.  Lipitor 12.  Hospital course hiccups.  Thorazine  10 mg every 4 hours as needed   - No hiccups 10-15 on evaluation--DC Thorazine  13.  History of popliteal aneurysm status post stenting.  Followed by vascular surgery Dr. Selinda Gu. HOLD ASPIRIN  AND PLAVIX  X ONE WEEK THEN RESUME 14.  History of AAA.  Followed outpatient by Dr. Gu 15.  Constipation.  MiraLAX twice daily, Senokot-S 2 tablets twice daily.   - Large, liquid bowel movement 10-15   - LBM 10/17 - reduced sennakot S to 1 tab BID   - LBM 10/19, medium  10-21: Multiple large bowel movements today.  Move MiraLAX to as needed, DC Senokot S  - 10-22 LBM   - 10/26: Smear BM; will give 1x dose miralax, resume sennakot S at bedtime  -10/27 LBM yesterday, will add sorbitol PRN  16. Urinary frequency/dysuria/flank pain:  -UA is +, UCX with 100k GNR  -continue empiric keflex 500mg  bid  -encouraged pt to continue drinking  despite frequency  10-20: Culture growing pansensitive E. coli; continue current regimen--continuing with urgency, has condom cath nightly  10-21: Is improving incontinence, remove nighttime catheter to encourage home regimen   10-22:  Start Flomax 0.4 mg nightly. CT renal study ordered for today. Urispas 100 mg TID. Encourage PO fluids  10/23: No obstructing stone, no s/s infection - DC urospas. Continue flomax d/t enlarged prostate.  May add Proscar and 1 more day if no improvement.  10-24: Patient with ongoing urinary frequency at nighttime, but no complaints today.  Continue current regimen  LOS: 13 days A FACE TO FACE EVALUATION WAS PERFORMED  Murray Collier 12/18/2023, 10:10 AM

## 2023-12-18 NOTE — Plan of Care (Signed)
  Problem: RH Balance Goal: LTG: Patient will maintain dynamic sitting balance (OT) Description: LTG:  Patient will maintain dynamic sitting balance with assistance during activities of daily living (OT) Outcome: Completed/Met   Problem: Sit to Stand Goal: LTG:  Patient will perform sit to stand in prep for activites of daily living with assistance level (OT) Description: LTG:  Patient will perform sit to stand in prep for activites of daily living with assistance level (OT) Outcome: Completed/Met   Problem: RH Bathing Goal: LTG Patient will bathe all body parts with assist levels (OT) Description: LTG: Patient will bathe all body parts with assist levels (OT) Outcome: Completed/Met   Problem: RH Dressing Goal: LTG Patient will perform upper body dressing (OT) Description: LTG Patient will perform upper body dressing with assist, with/without cues (OT). Outcome: Completed/Met Goal: LTG Patient will perform lower body dressing w/assist (OT) Description: LTG: Patient will perform lower body dressing with assist, with/without cues in positioning using equipment (OT) Outcome: Completed/Met   Problem: RH Toileting Goal: LTG Patient will perform toileting task (3/3 steps) with assistance level (OT) Description: LTG: Patient will perform toileting task (3/3 steps) with assistance level (OT)  Outcome: Completed/Met   Problem: RH Toilet Transfers Goal: LTG Patient will perform toilet transfers w/assist (OT) Description: LTG: Patient will perform toilet transfers with assist, with/without cues using equipment (OT) Outcome: Completed/Met   Problem: RH Tub/Shower Transfers Goal: LTG Patient will perform tub/shower transfers w/assist (OT) Description: LTG: Patient will perform tub/shower transfers with assist, with/without cues using equipment (OT) Outcome: Completed/Met   Problem: RH Balance Goal: LTG Patient will maintain dynamic standing with ADLs (OT) Description: LTG:  Patient will  maintain dynamic standing balance with assist during activities of daily living (OT)  Outcome: Completed/Met

## 2023-12-18 NOTE — Progress Notes (Addendum)
 Patient ID: Jerry [name], male   DOB: Dec 17, 1928, 89 y.o.   MRN: [id number]  SW met with pt, pt wife and pt dtr in room to discusses preferred HHA. Prefers 1) [employer] [facility] [employer], 2) [facility] and 3) [facility],.  1115- SW left message for Jerry [name] [phone]) to discuss [facility] referral. *SW received return phone call from Jerry [name]/[facility] to discuss referral. Informed SW to send in referral. SW sent referral to [email or web address] and waiting on follow-up.   SW ordered DME with [facility] via parachute.   18- SW spoke with Jerry [name]/[facility] to get updates about referral. SW waiting on follow-up if able to accept.  *Reports they only have HHPT/aide in their area and no OT or nursing. SW shared will follow-up with medical team and call back.   Therapy team reports pt will need HHOT as well.   SW sent HHPT/OT/aide referral to [facility] and waiting on follow-up . *referral accepted.  SW updated Electrical Engineer on above about HHOT needed.   SW met with pt and pt wife in room to inform on H[facility] and informed about [facility] process to establish care if they choose.   SW called pt dtr Jerry [name] to inform on above.  Jerry [name], MSW, LCSW Office: [phone] Cell: [phone] Fax: [phone]

## 2023-12-18 NOTE — Progress Notes (Signed)
 Inpatient Rehabilitation Discharge Medication Review by a Pharmacist  A complete drug regimen review was completed for this patient to identify any potential clinically significant medication issues.  High Risk Drug Classes Is patient taking? Indication by Medication  Antipsychotic {Receiving?:26196}   Anticoagulant {Receiving?:26196}   Antibiotic {Receiving?:26196}   Opioid {Receiving?:26196}   Antiplatelet {Receiving?:26196}   Hypoglycemics/insulin {Yes or No?:26198}   Vasoactive Medication {Receiving?:26196}   Chemotherapy {Receiving Chemo?:26197}   Other {Yes or No?:26198} Aspirin  - PAD Diltiazem *** Miralax - constipation Atorvastatin  - HLD Cyclobenzaparine - muscle spasms Diclofenac gel - topical pain relief Tamsulosin - urinary retention Trazodone - mood/sleep      Type of Medication Issue Identified Description of Issue Recommendation(s)  Drug Interaction(s) (clinically significant)     Duplicate Therapy     Allergy     No Medication Administration End Date     Incorrect Dose     Additional Drug Therapy Needed     Significant med changes from prior encounter (inform family/care partners about these prior to discharge). Diltiazem stopped at inpatient discharge Communicate relevant medication changes to patient/family members at discharge from CIR.   Other       Clinically significant medication issues were identified that warrant physician communication and completion of prescribed/recommended actions by midnight of the next day:  {Yes or No?:26198}  Name of provider notified for urgent issues identified: ***   Provider Method of Notification: ***    Pharmacist comments: *** Discharge plan for diltiazem pending Stopped at discharge from inpatient, resumed in CIR   Time spent performing this drug regimen review (minutes): 20   Thank you for allowing pharmacy to be a part of this patient's care.  Shelba Collier, PharmD, BCPS Clinical Pharmacist

## 2023-12-18 NOTE — Progress Notes (Signed)
 Occupational Therapy Session Note  Patient Details  Name: Jerry [name] MRN: [id number] Date of Birth: November 30, 1928  Today's Date: 12/18/2023 OT Individual Time: 0930-1000 OT Individual Time Calculation (min): 30 min    Short Term Goals: Week 2:  OT Short Term Goal 1 (Week 2): STGs=LTGs due to patient's estimated length of stay.  Skilled Therapeutic Interventions/Progress Updates:      Therapy Documentation Precautions:  Precautions Precautions: Fall Recall of Precautions/Restrictions: Impaired Precaution/Restrictions Comments: Unaware pf NWB RUE Required Braces or Orthoses: Sling Restrictions Weight Bearing Restrictions Per Provider Order: Yes RUE Weight Bearing Per Provider Order: Weight bearing as tolerated General: Pt supine in bed upon OT arrival, agreeable to OT session. Pt wife and daughter present.   Pain: no pain reported  Other Treatments: OT providing therapeutic use of self in order to build rapport and discuss patient current situation and goals for therapy. OT providing education for D/C planning. Pt family had questions about home health process and how to wrap hand wound for bathing, OT providing education on topics. OT also providing education for supervision goals and how family can best provide assistance required for safe D/C. Pt family very appreciative of information.    Pt supine in bed with bed alarm activated, 2 bed rails up, call light within reach and 4Ps assessed. Family present.    Therapy/Group: Individual Therapy  Camie Hoe, OTD, OTR/L 12/18/2023, 10:20 AM

## 2023-12-18 NOTE — Progress Notes (Signed)
 Inpatient Rehabilitation Care Coordinator Discharge Note   Patient Details  Name: Jerry [name] MRN: [id number] Date of Birth: 1928/11/04   Discharge location: D/c to home  Length of Stay: 13 days  Discharge activity level: Supervision  Home/community participation: Limited  Patient response un:Yzjouy Literacy - How often do you need to have someone help you when you read instructions, pamphlets, or other written material from your doctor or pharmacy?: Rarely  Patient response un:Dnrpjo Isolation - How often do you feel lonely or isolated from those around you?: Rarely  Services provided included: MD, PT, OT, RN, CM, TR, Neuropsych, SW, Pharmacy, RD  Financial Services:  Financial Services Utilized: Medicare    Choices offered to/list presented to: patient and family  Follow-up services arranged:  Home Health, DME Home Health Agency: [facility] for HHPT/OT/aide    DME : [facility] for [facility] and rollator    Patient response to transportation need: Is the patient able to respond to transportation needs?: Yes In the past 12 months, has lack of transportation kept you from medical appointments or from getting medications?: No In the past 12 months, has lack of transportation kept you from meetings, work, or from getting things needed for daily living?: No   Patient/Family verbalized understanding of follow-up arrangements:  Yes  Individual responsible for coordination of the follow-up plan: contact pt dtr Deitra Helling  Confirmed correct DME delivered: Graeme DELENA Jude 12/18/2023    Comments (or additional information):fam edu completed  Summary of Stay    Date/Time Discharge Planning CSW  12/11/23 1400 Pt will d/c to home with his wife as primary caregiver. Pt dtr [name] from [place] will be with them for a period of time but will have help as much as possible. SW will confirm there are no barriers to discharge. AAC       Motty Borin A Jude

## 2023-12-18 NOTE — Progress Notes (Signed)
 Wound care education provided to family with demonstration. Family had no questions once finished and supplies were given to the family.

## 2023-12-19 ENCOUNTER — Other Ambulatory Visit (HOSPITAL_COMMUNITY): Payer: Self-pay

## 2023-12-19 MED ORDER — OXYCODONE HCL 5 MG PO TABS
5.0000 mg | ORAL_TABLET | ORAL | 0 refills | Status: DC | PRN
  Filled 2023-12-19: qty 30, 5d supply, fill #0

## 2023-12-19 MED ORDER — TOPIRAMATE 25 MG PO TABS
25.0000 mg | ORAL_TABLET | Freq: Two times a day (BID) | ORAL | 0 refills | Status: DC | PRN
  Filled 2023-12-19: qty 30, 15d supply, fill #0

## 2023-12-19 MED ORDER — ATORVASTATIN CALCIUM 40 MG PO TABS
40.0000 mg | ORAL_TABLET | Freq: Every day | ORAL | 0 refills | Status: AC
Start: 2023-12-19 — End: ?
  Filled 2023-12-19: qty 30, 30d supply, fill #0

## 2023-12-19 MED ORDER — DILTIAZEM HCL ER COATED BEADS 240 MG PO CP24
240.0000 mg | ORAL_CAPSULE | Freq: Every day | ORAL | 0 refills | Status: DC
  Filled 2023-12-19: qty 30, 30d supply, fill #0

## 2023-12-19 MED ORDER — LIDOCAINE 5 % EX PTCH
2.0000 | MEDICATED_PATCH | CUTANEOUS | 0 refills | Status: DC
  Filled 2023-12-19: qty 30, 15d supply, fill #0

## 2023-12-19 MED ORDER — DICLOFENAC SODIUM 1 % EX GEL
2.0000 g | Freq: Four times a day (QID) | CUTANEOUS | 0 refills | Status: DC
  Filled 2023-12-19: qty 100, 14d supply, fill #0

## 2023-12-19 MED ORDER — TAMSULOSIN HCL 0.4 MG PO CAPS
0.4000 mg | ORAL_CAPSULE | Freq: Every day | ORAL | 0 refills | Status: DC
  Filled 2023-12-19: qty 30, 30d supply, fill #0

## 2023-12-19 MED ORDER — CLOPIDOGREL BISULFATE 75 MG PO TABS
75.0000 mg | ORAL_TABLET | Freq: Every day | ORAL | 0 refills | Status: AC
Start: 2023-12-19 — End: ?
  Filled 2023-12-19: qty 30, 30d supply, fill #0

## 2023-12-19 MED ORDER — CYCLOBENZAPRINE HCL 5 MG PO TABS
5.0000 mg | ORAL_TABLET | Freq: Three times a day (TID) | ORAL | 0 refills | Status: DC
  Filled 2023-12-19: qty 90, 30d supply, fill #0

## 2023-12-19 MED ORDER — TRAZODONE HCL 50 MG PO TABS
50.0000 mg | ORAL_TABLET | Freq: Every day | ORAL | 0 refills | Status: DC
  Filled 2023-12-19: qty 30, 30d supply, fill #0

## 2023-12-19 NOTE — Progress Notes (Signed)
 PROGRESS NOTE   Subjective/Complaints:  No events overnight.  Back somewhat bothersome today, but overall tolerable.  Feeling prepared for discharge.  Family states they are comfortable taking care of left hand dressing.  Vitals stable     12/19/2023    5:33 AM 12/18/2023    7:49 PM 12/18/2023    1:56 PM  Vitals with BMI  Systolic 130 122 862  Diastolic 56 58 58  Pulse 68 84 69       ROS: Patient denies fever, new skin changes, blurred vision, dizziness, nausea, vomiting, diarrhea, cough, shortness of breath or chest pain, joint or back/neck pain, or mood change.  + Bilateral low back pain--waxes and wanes depending on activity  Objective:   No results found.  No results for input(s): WBC, HGB, HCT, PLT in the last 72 hours.   No results for input(s): NA, K, CL, CO2, GLUCOSE, BUN, CREATININE, CALCIUM  in the last 72 hours.    Intake/Output Summary (Last 24 hours) at 12/19/2023 1029 Last data filed at 12/19/2023 0751 Gross per 24 hour  Intake 600 ml  Output 150 ml  Net 450 ml        Physical Exam: Vital Signs Blood pressure (!) 130/56, pulse 68, temperature 97.9 F (36.6 C), temperature source Oral, resp. rate 18, height 5' 5 (1.651 m), weight 64 kg, SpO2 100%.  Constitutional: No distress . Vital signs reviewed.  Sitting upright at bedside. HEENT:  Bruising around right face/eye resolving; EOMI, oral membranes moist Neck: supple Cardiovascular: RRR without murmur. No JVD    Respiratory/Chest: CTA Bilaterally without wheezes or rales. Normal effort    GI/Abdomen: BS +, non-tender, non-distended Ext: no clubbing, cyanosis, or edema Psych: pleasant and cooperative  Skin: Some bruising across right forehead and eye, temple.--Resolving + Large bruising/discoloration of right shoulder.   + Well-circumscribed dark lesion on right anterior shin, dry--covered in Mepilex + Skin  avulsion of left hand--remains covered in clean gauze with petroleum barrier--clean, well-circumscribed edges and beefy red base.  Healing well  --10-28  MSK:      No apparent deformity.   + TTP bilateral quadratus lumborum muscles,  TTP thoracic or lumbar paraspinals      Neurologic exam:  Cognition: AAO to person, place, year and month Language: No further appreciable difficulty word finding Memory: Moderate deficits. --Baseline per family. Insight: Fair insight into current condition.  Mood: Pleasant affect, appropriate mood.  Sensation: Equal and intact in BL UE and Les.  Reflexes: Negative Hoffman's and babinski signs bilaterally.  Bilateral upper extremity reflexes 2+ CN: Right ptosis--now minimal Coordination: No apparent tremors or ataxia Strength: 5- -5 left upper and lower extremity, 5-5 right upper and lower extremity     Assessment/Plan: 1. Functional deficits which require 3+ hours per day of interdisciplinary therapy in a comprehensive inpatient rehab setting. Physiatrist is providing close team supervision and 24 hour management of active medical problems listed below. Physiatrist and rehab team continue to assess barriers to discharge/monitor patient progress toward functional and medical goals  Care Tool:  Bathing    Body parts bathed by patient: Right arm, Chest, Abdomen, Right upper leg, Left upper leg, Face, Left arm, Front  perineal area, Buttocks, Right lower leg, Left lower leg   Body parts bathed by helper: Right lower leg, Left lower leg, Buttocks     Bathing assist Assist Level: Supervision/Verbal cueing     Upper Body Dressing/Undressing Upper body dressing   What is the patient wearing?: Pull over shirt    Upper body assist Assist Level: Supervision/Verbal cueing    Lower Body Dressing/Undressing Lower body dressing      What is the patient wearing?: Pants, Incontinence brief     Lower body assist Assist for lower body dressing:  Supervision/Verbal cueing     Toileting Toileting    Toileting assist Assist for toileting: Supervision/Verbal cueing     Transfers Chair/bed transfer  Transfers assist  Chair/bed transfer activity did not occur: Safety/medical concerns  Chair/bed transfer assist level: Supervision/Verbal cueing     Locomotion Ambulation   Ambulation assist      Assist level: Supervision/Verbal cueing Assistive device: Rollator Max distance: 150   Walk 10 feet activity   Assist     Assist level: Supervision/Verbal cueing Assistive device: Rollator   Walk 50 feet activity   Assist Walk 50 feet with 2 turns activity did not occur: Safety/medical concerns  Assist level: Supervision/Verbal cueing Assistive device: Rollator    Walk 150 feet activity   Assist Walk 150 feet activity did not occur: Safety/medical concerns  Assist level: Supervision/Verbal cueing Assistive device: Rollator    Walk 10 feet on uneven surface  activity   Assist     Assist level: Supervision/Verbal cueing Assistive device: Rollator   Wheelchair     Assist Is the patient using a wheelchair?: No Type of Wheelchair: Manual    Wheelchair assist level: Dependent - Patient 0% Max wheelchair distance: 150'    Wheelchair 50 feet with 2 turns activity    Assist        Assist Level: Dependent - Patient 0%   Wheelchair 150 feet activity     Assist      Assist Level: Dependent - Patient 0%   Blood pressure (!) 130/56, pulse 68, temperature 97.9 F (36.6 C), temperature source Oral, resp. rate 18, height 5' 5 (1.651 m), weight 64 kg, SpO2 100%.  Medical Problem List and Plan: 1. 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  Conservative care per neurosurgery Dr. Alm Molt.             -patient may  shower             -ELOS/Goals: 14-21 days Supervision to min A - 10/28 DC             -Continue CIR therapies including PT, OT, and SLP      - 10/21: SPV to CGA for PT; Min A walking without AD 180 ft; biggest barrier is poor awareness of WB precautions with RUE and balance deficits. OT setup to SPV UB, intermittent Min A LB, needs encouragement for independence. Making excellent progress. At cognitive baseline.   The patient is medically ready for discharge to home and will need follow-up with [facility] PM[facility]R. In addition, they will need to follow up with their PCP, Orthopedics.     2.  Antithrombotics: -DVT/anticoagulation:  Mechanical: Antiembolism stockings, thigh (TED hose) Bilateral lower extremities             -antiplatelet therapy:  ASPIRIN  AND PLAVIX  resumed 10/17  3. Pain Management: Oxycodone  as needed, Robaxin 500 mg every 8 hours as needed muscle spasms   -  No complaints of pain   - 10-16: Bilateral flank pain likely musculoskeletal; discussed with nursing, add aqua thermia, scheduled Robaxin 500 mg 3 times daily   - 10-17: Pain improved with Robaxin, is having more low back pain to today, discussing with nursing getting aquathermia in room  10-20: For right sided headache, added Tylenol  1000 mg 3 times daily and Topamax 25 mg nightly.  If no improvement or if headache quality worsens, recommend repeat CT head.  10-21: Headache improved/resolved with current regimen  10/23: Increase robaxin to 1000 mg TID d/t back pain; heat pack has been the most helpful.  Did discuss findings of CT including multilevel disease of the lumbar spine, likely chronically contributing to back pain  - 10-24: Pain well-controlled per patient  - 10-25: Back pain/spasms back, seems to be worsened after busy days of therapy.  Change Robaxin to Flexeril 5 mg 3 times daily.--Better controlled, truly seems to wax and wane depending on his activity level the day prior.  -10/27 reports pain controlled today, continue current regimen  4. Mood/Behavior/Sleep: Provide emotional support             -antipsychotic agents: N/A  - 10-15: Complains of poor  sleep, add melatonin 5 mg nightly as needed--scheduled 10-16--improved  10-20: Ongoing poor sleep.  Trazodone 50 mg nightly added.--Significant improvement  5. Neuropsych/cognition: This patient he is capable of making decisions on his own behalf.  6. 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 Fluids/Electrolytes/Nutrition: Routine in and outs with follow-up chemistries   - 10-15: A.m. labs stable, albumin 2.2.  Will get dietary assessment.  8.  Seizure prophylaxis.  Keppra 750 mg twice daily x 7 days total--completed 9.  Nondisplaced right clavicle fracture.  Conservative care per [name] orthopedic surgery.  Nonweightbearing.  Follow-up x-rays 2 weeks (10/23)   - 10/23: Messaged Dr Reyne regarding xray / WB follow up--repeat x-rays today, patient cleared for weightbearing as tolerated  - X-ray showing stable clavicular fracture  10.  Hypertension.  Cardizem 240 mg daily.  Monitor with increased mobility   - Vitals well-controlled on current regimen  -10/27 controlled overall, continue current and monitor    12/19/2023    5:33 AM 12/18/2023    7:49 PM 12/18/2023    1:56 PM  Vitals with BMI  Systolic 130 122 862  Diastolic 56 58 58  Pulse 68 84 69    11.  Hyperlipidemia.  Lipitor 12.  Hospital course hiccups.  Thorazine  10 mg every 4 hours as needed   - No hiccups 10-15 on evaluation--DC Thorazine  13.  History of popliteal aneurysm status post stenting.  Followed by vascular surgery Dr. Selinda Gu. HOLD ASPIRIN  AND PLAVIX  X ONE WEEK THEN RESUME 14.  History of AAA.  Followed outpatient by Dr. Gu 15.  Constipation.  MiraLAX twice daily, Senokot-S 2 tablets twice daily.   - Large, liquid bowel movement 10-15   - LBM 10/17 - reduced sennakot S to 1 tab BID   - LBM 10/19,  medium  10-21: Multiple large bowel movements today.  Move MiraLAX to as needed, DC Senokot S  - 10-22 LBM   - 10/26: Smear BM; will give 1x dose miralax, resume sennakot S at bedtime  -10/27 LBM yesterday, will add sorbitol PRN  16. Urinary frequency/dysuria/flank pain:  -UA is +, UCX with 100k GNR  -continue empiric keflex 500mg  bid  -encouraged pt to continue drinking despite frequency  10-20: Culture growing pansensitive E. coli; continue current regimen--continuing with urgency, has condom cath nightly  10-21: Is improving incontinence, remove nighttime catheter to encourage home regimen   10-22:  Start Flomax 0.4 mg nightly. CT renal study ordered for today. Urispas 100 mg TID. Encourage PO fluids  10/23: No obstructing stone, no s/s infection - DC urospas. Continue flomax d/t enlarged prostate.  May add Proscar and 1 more day if no improvement.  10-24: Patient with ongoing urinary frequency at nighttime, but no complaints today.  Continue current regimen  LOS: 14 days A FACE TO FACE EVALUATION WAS PERFORMED  Jerry [name] Likes 12/19/2023, 10:29 AM

## 2023-12-19 NOTE — Telephone Encounter (Signed)
 Pharmacy Patient Advocate Encounter   Received notification from Inpatient Request that prior authorization for Flexeril 5mg  is required/requested.  Received notification from Inpatient Request that prior authorization for Lidocaine  5% Patch is required/requested.   Insurance verification completed.   The patient is insured through [employer].   Per test claim: PA required; PA submitted to above mentioned insurance via Latent Key/confirmation #/EOC [facility] Status is pending  Per test claim: PA required; PA submitted to above mentioned insurance via Latent Key/confirmation #/EOC [id number] Status is pending   Received notification from [employer] that Prior Authorization for Flexeril 5mg  has been APPROVED from 12/19/23 to 02/20/2098. Ran test claim, Copay is $7.34. This test claim was processed through [facility]- copay amounts may vary at other pharmacies due to pharmacy/plan contracts, or as the patient moves through the different stages of their insurance plan.  Received notification from [facility] that Prior Authorization for Lidocaine  5% patch has been APPROVED from 12/19/23 to 02/20/2098. Ran test claim, Copay is $43.96. This test claim was processed through [facility]- copay amounts may vary at other pharmacies due to pharmacy/plan contracts, or as the patient moves through the different stages of their insurance plan.

## 2023-12-27 ENCOUNTER — Encounter: Admitting: Registered Nurse

## 2024-01-19 ENCOUNTER — Emergency Department (HOSPITAL_COMMUNITY)

## 2024-01-19 ENCOUNTER — Inpatient Hospital Stay (HOSPITAL_COMMUNITY)
Admission: EM | Admit: 2024-01-19 | Discharge: 2024-01-29 | DRG: 853 | Disposition: A | Discharge status: Rehabilitation Facility | Attending: Family Medicine | Admitting: Family Medicine

## 2024-01-19 ENCOUNTER — Inpatient Hospital Stay (HOSPITAL_COMMUNITY): Admitting: Registered Nurse

## 2024-01-19 ENCOUNTER — Encounter (HOSPITAL_COMMUNITY): Payer: Self-pay | Admitting: Urology

## 2024-01-19 ENCOUNTER — Other Ambulatory Visit: Payer: Self-pay

## 2024-01-19 ENCOUNTER — Encounter (HOSPITAL_COMMUNITY)
Admission: EM | Disposition: A | Discharge status: Rehabilitation Facility | Payer: Self-pay | Source: Home / Self Care | Attending: Family Medicine

## 2024-01-19 DIAGNOSIS — I10 Essential (primary) hypertension: Secondary | ICD-10-CM | POA: Diagnosis present

## 2024-01-19 DIAGNOSIS — N200 Calculus of kidney: Secondary | ICD-10-CM

## 2024-01-19 DIAGNOSIS — I4510 Unspecified right bundle-branch block: Secondary | ICD-10-CM | POA: Insufficient documentation

## 2024-01-19 DIAGNOSIS — R6521 Severe sepsis with septic shock: Secondary | ICD-10-CM | POA: Diagnosis present

## 2024-01-19 DIAGNOSIS — I5043 Acute on chronic combined systolic (congestive) and diastolic (congestive) heart failure: Secondary | ICD-10-CM | POA: Insufficient documentation

## 2024-01-19 DIAGNOSIS — N179 Acute kidney failure, unspecified: Secondary | ICD-10-CM | POA: Diagnosis not present

## 2024-01-19 DIAGNOSIS — I420 Dilated cardiomyopathy: Secondary | ICD-10-CM | POA: Diagnosis present

## 2024-01-19 DIAGNOSIS — R7989 Other specified abnormal findings of blood chemistry: Secondary | ICD-10-CM

## 2024-01-19 DIAGNOSIS — A4151 Sepsis due to Escherichia coli [E. coli]: Secondary | ICD-10-CM | POA: Diagnosis not present

## 2024-01-19 DIAGNOSIS — R5381 Other malaise: Secondary | ICD-10-CM | POA: Diagnosis present

## 2024-01-19 DIAGNOSIS — S42001S Fracture of unspecified part of right clavicle, sequela: Secondary | ICD-10-CM | POA: Diagnosis not present

## 2024-01-19 DIAGNOSIS — I4891 Unspecified atrial fibrillation: Secondary | ICD-10-CM

## 2024-01-19 DIAGNOSIS — E440 Moderate protein-calorie malnutrition: Secondary | ICD-10-CM | POA: Diagnosis not present

## 2024-01-19 DIAGNOSIS — E872 Acidosis, unspecified: Secondary | ICD-10-CM | POA: Diagnosis present

## 2024-01-19 DIAGNOSIS — E785 Hyperlipidemia, unspecified: Secondary | ICD-10-CM | POA: Diagnosis present

## 2024-01-19 DIAGNOSIS — N10 Acute tubulo-interstitial nephritis: Secondary | ICD-10-CM | POA: Diagnosis not present

## 2024-01-19 DIAGNOSIS — Z79899 Other long term (current) drug therapy: Secondary | ICD-10-CM | POA: Diagnosis not present

## 2024-01-19 DIAGNOSIS — H9190 Unspecified hearing loss, unspecified ear: Secondary | ICD-10-CM | POA: Diagnosis present

## 2024-01-19 DIAGNOSIS — R066 Hiccough: Secondary | ICD-10-CM | POA: Diagnosis present

## 2024-01-19 DIAGNOSIS — Z8249 Family history of ischemic heart disease and other diseases of the circulatory system: Secondary | ICD-10-CM | POA: Diagnosis not present

## 2024-01-19 DIAGNOSIS — G9341 Metabolic encephalopathy: Secondary | ICD-10-CM | POA: Diagnosis present

## 2024-01-19 DIAGNOSIS — R7881 Bacteremia: Secondary | ICD-10-CM

## 2024-01-19 DIAGNOSIS — X58XXXS Exposure to other specified factors, sequela: Secondary | ICD-10-CM | POA: Diagnosis present

## 2024-01-19 DIAGNOSIS — B957 Other staphylococcus as the cause of diseases classified elsewhere: Secondary | ICD-10-CM | POA: Diagnosis present

## 2024-01-19 DIAGNOSIS — A419 Sepsis, unspecified organism: Secondary | ICD-10-CM | POA: Diagnosis not present

## 2024-01-19 DIAGNOSIS — J441 Chronic obstructive pulmonary disease with (acute) exacerbation: Secondary | ICD-10-CM | POA: Diagnosis present

## 2024-01-19 DIAGNOSIS — H3530 Unspecified macular degeneration: Secondary | ICD-10-CM | POA: Diagnosis present

## 2024-01-19 DIAGNOSIS — L89620 Pressure ulcer of left heel, unstageable: Secondary | ICD-10-CM | POA: Diagnosis present

## 2024-01-19 DIAGNOSIS — N136 Pyonephrosis: Secondary | ICD-10-CM | POA: Diagnosis present

## 2024-01-19 DIAGNOSIS — I5031 Acute diastolic (congestive) heart failure: Secondary | ICD-10-CM | POA: Diagnosis present

## 2024-01-19 DIAGNOSIS — K5900 Constipation, unspecified: Secondary | ICD-10-CM | POA: Diagnosis present

## 2024-01-19 DIAGNOSIS — D75839 Thrombocytosis, unspecified: Secondary | ICD-10-CM | POA: Diagnosis not present

## 2024-01-19 DIAGNOSIS — J9601 Acute respiratory failure with hypoxia: Secondary | ICD-10-CM | POA: Diagnosis present

## 2024-01-19 DIAGNOSIS — B9620 Unspecified Escherichia coli [E. coli] as the cause of diseases classified elsewhere: Secondary | ICD-10-CM

## 2024-01-19 DIAGNOSIS — N139 Obstructive and reflux uropathy, unspecified: Secondary | ICD-10-CM

## 2024-01-19 DIAGNOSIS — I2489 Other forms of acute ischemic heart disease: Secondary | ICD-10-CM | POA: Diagnosis present

## 2024-01-19 DIAGNOSIS — I5041 Acute combined systolic (congestive) and diastolic (congestive) heart failure: Secondary | ICD-10-CM | POA: Insufficient documentation

## 2024-01-19 DIAGNOSIS — Z7982 Long term (current) use of aspirin: Secondary | ICD-10-CM | POA: Diagnosis not present

## 2024-01-19 DIAGNOSIS — L89323 Pressure ulcer of left buttock, stage 3: Secondary | ICD-10-CM | POA: Diagnosis present

## 2024-01-19 DIAGNOSIS — N201 Calculus of ureter: Secondary | ICD-10-CM | POA: Diagnosis not present

## 2024-01-19 DIAGNOSIS — L89890 Pressure ulcer of other site, unstageable: Secondary | ICD-10-CM | POA: Diagnosis present

## 2024-01-19 DIAGNOSIS — L89151 Pressure ulcer of sacral region, stage 1: Secondary | ICD-10-CM | POA: Diagnosis present

## 2024-01-19 HISTORY — PX: CYSTOSCOPY W/ URETERAL STENT PLACEMENT: SHX1429

## 2024-01-19 LAB — CBC WITH DIFFERENTIAL/PLATELET
Abs Immature Granulocytes: 0.13 K/uL — ABNORMAL HIGH (ref 0.00–0.07)
Basophils Absolute: 0 K/uL (ref 0.0–0.1)
Basophils Relative: 0 %
Eosinophils Absolute: 0 K/uL (ref 0.0–0.5)
Eosinophils Relative: 0 %
HCT: 39.6 % (ref 39.0–52.0)
Hemoglobin: 12.5 g/dL — ABNORMAL LOW (ref 13.0–17.0)
Immature Granulocytes: 1 %
Lymphocytes Relative: 1 %
Lymphs Abs: 0.1 K/uL — ABNORMAL LOW (ref 0.7–4.0)
MCH: 31.4 pg (ref 26.0–34.0)
MCHC: 31.6 g/dL (ref 30.0–36.0)
MCV: 99.5 fL (ref 80.0–100.0)
Monocytes Absolute: 0.1 K/uL (ref 0.1–1.0)
Monocytes Relative: 1 %
Neutro Abs: 14.5 K/uL — ABNORMAL HIGH (ref 1.7–7.7)
Neutrophils Relative %: 97 %
Platelets: 178 K/uL (ref 150–400)
RBC: 3.98 MIL/uL — ABNORMAL LOW (ref 4.22–5.81)
RDW: 14.2 % (ref 11.5–15.5)
WBC: 14.9 K/uL — ABNORMAL HIGH (ref 4.0–10.5)
nRBC: 0 % (ref 0.0–0.2)

## 2024-01-19 LAB — I-STAT VENOUS BLOOD GAS, ED
Acid-base deficit: 5 mmol/L — ABNORMAL HIGH (ref 0.0–2.0)
Bicarbonate: 15.8 mmol/L — ABNORMAL LOW (ref 20.0–28.0)
Calcium, Ion: 1 mmol/L — ABNORMAL LOW (ref 1.15–1.40)
HCT: 36 % — ABNORMAL LOW (ref 39.0–52.0)
Hemoglobin: 12.2 g/dL — ABNORMAL LOW (ref 13.0–17.0)
O2 Saturation: 74 %
Potassium: 3.8 mmol/L (ref 3.5–5.1)
Sodium: 135 mmol/L (ref 135–145)
TCO2: 16 mmol/L — ABNORMAL LOW (ref 22–32)
pCO2, Ven: 19.5 mmHg — CL (ref 44–60)
pH, Ven: 7.518 — ABNORMAL HIGH (ref 7.25–7.43)
pO2, Ven: 33 mmHg (ref 32–45)

## 2024-01-19 LAB — URINALYSIS, W/ REFLEX TO CULTURE (INFECTION SUSPECTED)
Bilirubin Urine: NEGATIVE
Glucose, UA: NEGATIVE mg/dL
Hgb urine dipstick: NEGATIVE
Ketones, ur: 5 mg/dL — AB
Nitrite: NEGATIVE
Protein, ur: NEGATIVE mg/dL
Specific Gravity, Urine: 1.019 (ref 1.005–1.030)
pH: 5 (ref 5.0–8.0)

## 2024-01-19 LAB — COMPREHENSIVE METABOLIC PANEL WITH GFR
ALT: 38 U/L (ref 0–44)
AST: 54 U/L — ABNORMAL HIGH (ref 15–41)
Albumin: 2.7 g/dL — ABNORMAL LOW (ref 3.5–5.0)
Alkaline Phosphatase: 164 U/L — ABNORMAL HIGH (ref 38–126)
Anion gap: 16 — ABNORMAL HIGH (ref 5–15)
BUN: 21 mg/dL (ref 8–23)
CO2: 16 mmol/L — ABNORMAL LOW (ref 22–32)
Calcium: 8.7 mg/dL — ABNORMAL LOW (ref 8.9–10.3)
Chloride: 106 mmol/L (ref 98–111)
Creatinine, Ser: 1.75 mg/dL — ABNORMAL HIGH (ref 0.61–1.24)
GFR, Estimated: 36 mL/min — ABNORMAL LOW (ref >60)
Glucose, Bld: 120 mg/dL — ABNORMAL HIGH (ref 70–99)
Potassium: 3.9 mmol/L (ref 3.5–5.1)
Sodium: 138 mmol/L (ref 135–145)
Total Bilirubin: 1 mg/dL (ref 0.0–1.2)
Total Protein: 5 g/dL — ABNORMAL LOW (ref 6.5–8.1)

## 2024-01-19 LAB — I-STAT CHEM 8, ED
BUN: 23 mg/dL (ref 8–23)
Calcium, Ion: 1.02 mmol/L — ABNORMAL LOW (ref 1.15–1.40)
Chloride: 107 mmol/L (ref 98–111)
Creatinine, Ser: 1.6 mg/dL — ABNORMAL HIGH (ref 0.61–1.24)
Glucose, Bld: 115 mg/dL — ABNORMAL HIGH (ref 70–99)
HCT: 38 % — ABNORMAL LOW (ref 39.0–52.0)
Hemoglobin: 12.9 g/dL — ABNORMAL LOW (ref 13.0–17.0)
Potassium: 3.8 mmol/L (ref 3.5–5.1)
Sodium: 137 mmol/L (ref 135–145)
TCO2: 16 mmol/L — ABNORMAL LOW (ref 22–32)

## 2024-01-19 LAB — RESP PANEL BY RT-PCR (RSV, FLU A&B, COVID)  RVPGX2
Influenza A by PCR: NEGATIVE
Influenza B by PCR: NEGATIVE
Resp Syncytial Virus by PCR: NEGATIVE
SARS Coronavirus 2 by RT PCR: NEGATIVE

## 2024-01-19 LAB — I-STAT CG4 LACTIC ACID, ED
Lactic Acid, Venous: 5.1 mmol/L (ref 0.5–1.9)
Lactic Acid, Venous: 7 mmol/L (ref 0.5–1.9)

## 2024-01-19 LAB — LIPASE, BLOOD: Lipase: 20 U/L (ref 11–51)

## 2024-01-19 LAB — PROTIME-INR
INR: 1.3 — ABNORMAL HIGH (ref 0.8–1.2)
Prothrombin Time: 16.4 s — ABNORMAL HIGH (ref 11.4–15.2)

## 2024-01-19 LAB — BRAIN NATRIURETIC PEPTIDE: B Natriuretic Peptide: 292.3 pg/mL — ABNORMAL HIGH (ref 0.0–100.0)

## 2024-01-19 SURGERY — CYSTOSCOPY, WITH RETROGRADE PYELOGRAM AND URETERAL STENT INSERTION
Anesthesia: General | Site: Ureter | Laterality: Bilateral

## 2024-01-19 MED ORDER — IPRATROPIUM-ALBUTEROL 0.5-2.5 (3) MG/3ML IN SOLN
3.0000 mL | Freq: Four times a day (QID) | RESPIRATORY_TRACT | Status: AC | PRN
  Administered 2024-01-20 – 2024-01-22 (×3): 3 mL via RESPIRATORY_TRACT
  Filled 2024-01-19 (×3): qty 3

## 2024-01-19 MED ORDER — LIDOCAINE 2% (20 MG/ML) 5 ML SYRINGE
INTRAMUSCULAR | Status: DC | PRN
  Administered 2024-01-19: 60 mg via INTRAVENOUS

## 2024-01-19 MED ORDER — LACTATED RINGERS IV BOLUS (SEPSIS)
1000.0000 mL | Freq: Once | INTRAVENOUS | Status: AC
Start: 2024-01-19 — End: 2024-01-19
  Administered 2024-01-19: 1000 mL via INTRAVENOUS

## 2024-01-19 MED ORDER — PROPOFOL 500 MG/50ML IV EMUL
INTRAVENOUS | Status: DC | PRN
  Administered 2024-01-19: 100 ug/kg/min via INTRAVENOUS

## 2024-01-19 MED ORDER — IOHEXOL 300 MG/ML  SOLN
INTRAMUSCULAR | Status: DC | PRN
  Administered 2024-01-19: 100 mL

## 2024-01-19 MED ORDER — FENTANYL CITRATE (PF) 250 MCG/5ML IJ SOLN
INTRAMUSCULAR | Status: AC
  Filled 2024-01-19: qty 5

## 2024-01-19 MED ORDER — PIPERACILLIN-TAZOBACTAM 3.375 G IVPB 30 MIN
3.3750 g | Freq: Once | INTRAVENOUS | Status: AC
  Administered 2024-01-19: 3.375 g via INTRAVENOUS
  Filled 2024-01-19: qty 50

## 2024-01-19 MED ORDER — IPRATROPIUM-ALBUTEROL 0.5-2.5 (3) MG/3ML IN SOLN: 3.0000 mL | RESPIRATORY_TRACT | Status: DC

## 2024-01-19 MED ORDER — ALBUMIN HUMAN 5 % IV SOLN: INTRAVENOUS | Status: DC | PRN

## 2024-01-19 MED ORDER — WATER FOR IRRIGATION, STERILE IR SOLN
Status: DC | PRN
  Administered 2024-01-19: 3000 mL

## 2024-01-19 MED ORDER — PHENYLEPHRINE HCL-NACL 20-0.9 MG/250ML-% IV SOLN
INTRAVENOUS | Status: DC | PRN
  Administered 2024-01-19: 50 ug/min via INTRAVENOUS

## 2024-01-19 MED ORDER — SUCCINYLCHOLINE CHLORIDE 200 MG/10ML IV SOSY
PREFILLED_SYRINGE | INTRAVENOUS | Status: DC | PRN
  Administered 2024-01-19: 100 mg via INTRAVENOUS

## 2024-01-19 MED ORDER — LACTATED RINGERS IV SOLN: INTRAVENOUS | Status: AC

## 2024-01-19 MED ORDER — VANCOMYCIN HCL IN DEXTROSE 1-5 GM/200ML-% IV SOLN
1000.0000 mg | Freq: Once | INTRAVENOUS | Status: AC
  Administered 2024-01-19: 1000 mg via INTRAVENOUS
  Filled 2024-01-19: qty 200

## 2024-01-19 MED ORDER — 0.9 % SODIUM CHLORIDE (POUR BTL) OPTIME
TOPICAL | Status: DC | PRN
  Administered 2024-01-19: 1000 mL

## 2024-01-19 MED ORDER — PROPOFOL 10 MG/ML IV BOLUS
INTRAVENOUS | Status: DC | PRN
  Administered 2024-01-19: 40 mg via INTRAVENOUS

## 2024-01-19 MED ORDER — METHYLPREDNISOLONE SODIUM SUCC 125 MG IJ SOLR
125.0000 mg | Freq: Once | INTRAMUSCULAR | Status: AC
  Administered 2024-01-19: 125 mg via INTRAVENOUS

## 2024-01-19 MED ORDER — IPRATROPIUM-ALBUTEROL 0.5-2.5 (3) MG/3ML IN SOLN
3.0000 mL | RESPIRATORY_TRACT | Status: AC
Start: 2024-01-19 — End: 2024-01-19
  Administered 2024-01-19: 3 mL via RESPIRATORY_TRACT

## 2024-01-19 MED ORDER — ACETAMINOPHEN 500 MG PO TABS
1000.0000 mg | ORAL_TABLET | Freq: Once | ORAL | Status: DC
Start: 2024-01-19 — End: 2024-01-20
  Filled 2024-01-19: qty 2

## 2024-01-19 MED ORDER — ACETAMINOPHEN 10 MG/ML IV SOLN
INTRAVENOUS | Status: DC | PRN
  Administered 2024-01-19: 1000 mg via INTRAVENOUS

## 2024-01-19 MED ORDER — PHENYLEPHRINE 80 MCG/ML (10ML) SYRINGE FOR IV PUSH (FOR BLOOD PRESSURE SUPPORT)
PREFILLED_SYRINGE | INTRAVENOUS | Status: DC | PRN
  Administered 2024-01-19: 80 ug via INTRAVENOUS
  Administered 2024-01-19: 160 ug via INTRAVENOUS
  Administered 2024-01-19: 80 ug via INTRAVENOUS
  Administered 2024-01-20 (×2): 160 ug via INTRAVENOUS

## 2024-01-19 MED ORDER — SODIUM CHLORIDE 0.9 % IV SOLN
1.0000 g | Freq: Every day | INTRAVENOUS | Status: DC
  Administered 2024-01-20: 1 g via INTRAVENOUS
  Filled 2024-01-19: qty 10

## 2024-01-19 MED ORDER — LIDOCAINE HCL URETHRAL/MUCOSAL 2 % EX GEL
CUTANEOUS | Status: AC
  Filled 2024-01-19: qty 11

## 2024-01-19 MED ORDER — ONDANSETRON HCL 4 MG/2ML IJ SOLN
INTRAMUSCULAR | Status: DC | PRN
  Administered 2024-01-19: 4 mg via INTRAVENOUS

## 2024-01-19 MED ORDER — FENTANYL CITRATE (PF) 50 MCG/ML IJ SOSY
50.0000 ug | PREFILLED_SYRINGE | Freq: Once | INTRAMUSCULAR | Status: AC
  Administered 2024-01-19: 50 ug via INTRAVENOUS
  Filled 2024-01-19: qty 1

## 2024-01-19 MED ORDER — LACTATED RINGERS IV SOLN: INTRAVENOUS | Status: DC | PRN

## 2024-01-19 MED ORDER — PROPOFOL 10 MG/ML IV BOLUS
INTRAVENOUS | Status: AC
Start: 2024-01-19 — End: 2024-01-19
  Filled 2024-01-19: qty 20

## 2024-01-19 SURGICAL SUPPLY — 18 items
BAG DRAIN URO-CYSTO SKYTR STRL (DRAIN) ×1 IMPLANT
BAG URINE DRAIN 2000ML AR STRL (UROLOGICAL SUPPLIES) ×1 IMPLANT
CATH FOLEY 2WAY SLVR 5CC 16FR (CATHETERS) IMPLANT
CATH URETL OPEN END 6FR 70 (CATHETERS) ×1 IMPLANT
GLOVE BIOGEL M 8.0 STRL (GLOVE) ×1 IMPLANT
GOWN STRL REUS W/ TWL LRG LVL3 (GOWN DISPOSABLE) ×1 IMPLANT
GOWN STRL REUS W/ TWL XL LVL3 (GOWN DISPOSABLE) ×1 IMPLANT
GUIDEWIRE ANG ZIPWIRE 038X150 (WIRE) IMPLANT
GUIDEWIRE STR DUAL SENSOR (WIRE) ×1 IMPLANT
KIT TURNOVER KIT B (KITS) ×1 IMPLANT
MANIFOLD NEPTUNE II (INSTRUMENTS) IMPLANT
PACK CYSTO (CUSTOM PROCEDURE TRAY) ×1 IMPLANT
SOLN 0.9% NACL POUR BTL 1000ML (IV SOLUTION) IMPLANT
STENT URET 6FRX24 CONTOUR (STENTS) IMPLANT
SYPHON OMNI JUG (MISCELLANEOUS) ×1 IMPLANT
TOWEL GREEN STERILE FF (TOWEL DISPOSABLE) ×1 IMPLANT
TUBE CONNECTING 12X1/4 (SUCTIONS) IMPLANT
WATER STERILE IRR 3000ML UROMA (IV SOLUTION) ×1 IMPLANT

## 2024-01-19 NOTE — ED Triage Notes (Signed)
 Pt was at home when his family heard a loud noise and when they went to the bathroom found him on the floor PT is on Pavix.Pt has been altered since the fall.

## 2024-01-19 NOTE — ED Notes (Signed)
 Pt back to CT

## 2024-01-19 NOTE — Anesthesia Procedure Notes (Signed)
 Procedure Name: Intubation Date/Time: 01/19/2024 11:36 PM  Performed by: Haylei Cobin C., CRNAPre-anesthesia Checklist: Patient identified, Emergency Drugs available, Suction available, Patient being monitored and Timeout performed Patient Re-evaluated:Patient Re-evaluated prior to induction Oxygen Delivery Method: Circle system utilized Preoxygenation: Pre-oxygenation with 100% oxygen Induction Type: IV induction and Rapid sequence Laryngoscope Size: Miller and 2 Grade View: Grade I Tube type: Oral Tube size: 7.0 mm Number of attempts: 1 Airway Equipment and Method: Stylet Placement Confirmation: ETT inserted through vocal cords under direct vision, positive ETCO2 and breath sounds checked- equal and bilateral Secured at: 22 cm Tube secured with: Tape Dental Injury: Teeth and Oropharynx as per pre-operative assessment

## 2024-01-19 NOTE — H&P (Signed)
 NAME:  Jerry [name], MRN:  [id number], DOB:  December 22, 1928, LOS: 0 ADMISSION DATE:  01/19/2024, CONSULTATION DATE:  11/28 REFERRING MD:  Ar[place], EDP CHIEF COMPLAINT: sepsis   History of Present Illness:  89 year old male with past medical history of AAA, recent popliteal aneurysm s/p stent on DAPT 2024, MCTD, hyperlipidemia, hypertension, recent fall with SDH d/c to rehab 2 weeks ago, previous nephrolithiasis who presented to ED with fall and delirium. EMS was called after family heard him fall at home. With EMS, delirious, temp 103 axillary. In ED, initially hypotensive with SBP 80s , tacyhcardic. Labs notable for WBC 14.9, plt 178, CO2 16, sCr 1.75, AST 54, Alk phos 164, AG 16, INR 1.3, UA + UTI, culture and blood cultures sent. BNP 292, pH 7.51, pCO2 19.5. initial I-stat lactic 5.1. was given 2L IVF and repeat lactic increased to 7. CXR with no acute findings. CT head and C-spine negative. CT CAP demonstrating 2mm calculus in mid left ureter with mild obstructive uropathy. Was started on vancomycin, zosyn. Also had mild COPD exacerbation noted, was given solu-medrol , nebs.  Urology was consulted by EDP and he was taken to OR for cystoscopy and possible stent placement. Admitted to CCM post operatively.   Pertinent  Medical History  AAA, recent popliteal aneurysm s/p stent on DAPT 2024, MCTD, hyperlipidemia, hypertension, recent fall with SDH  Significant Hospital Events: Including procedures, antibiotic start and stop dates in addition to other pertinent events   11/28: EDP for septic shock, OR 2/2 left ureteral stone w/ obstruction > PCCM admit post OR  Interim History / Subjective:  ***  Objective   Blood pressure (!) 123/90, pulse 95, temperature (!) 103 F (39.4 C), temperature source Rectal, resp. rate (!) 24, height 5' 5 (1.651 m), weight 64 kg, SpO2 100%.        Intake/Output Summary (Last 24 hours) at 01/19/2024 2321 Last data filed at 01/19/2024 2058 Gross per 24 hour   Intake 1043.85 ml  Output --  Net 1043.85 ml   Filed Weights   01/19/24 2031  Weight: 64 kg    Examination: General: *** HENT: *** Lungs: *** Cardiovascular: *** Abdomen: *** Extremities: *** Neuro: *** GU: ***  Resolved Hospital Problem list    Assessment & Plan:  Sepsis 2/2 urosepsis and obstructed left ureteral stone  Acute pyelonephritis  Unclear duration of symptoms. UA +. CT with left obstructed stone. Taken to OR by Dr. Watt. Has history of pansensitive e coli  - post-op care per urology  - con't vancomycin and zosyn  - f/u urine and blood cultures  - obtain echocardiogram  - ***  - trend wbc,fever,lactic curve   Acute metabolic encephalopathy  Likely septic encephalopathy, hypoperfusion. CT head negative.  - neuro checks  - delirium precautions  - clinically monitor  - treat underlying condition   Lactic acidosis  5.1>7 after 2L IVF.  - con't IVF  - OR for stent placement  - echo  - con't LR @ 125cc/hr *** - trend to clearance   AKI 2/2 UTI and obstructed ureteral stone AGMA  - stone management per urology  - vancomycin,zosyn until culture data returns  - trend bmp, mag, phos - replete elytes - strict I&O - Avoid nephrotoxic agents, renally dose medications - ensure adequate renal perfusion   AECOPD?  History of tobacco use  Report from EDP regarding possible COPD exacerbation. Received solu-medrol . Not on any home inhalers. Does not follow pulm. Do not see any PFTs  -  clinically monitor   Hypertension  Hyperlipidemia  Home meds: atorvastatin  40mg  daily, cardizem  240mg  daily - hold meds until tomorrow   Recent popliteal aneurysm s/p stent on DAPT  - con't DAPT with ASA, plavix    Recent fall with small right frontal subdural   Repeat CT head with no acute findings - neuro checks  - already restarted DAPT   AAA  Stable on CT CAP on admit   Labs   CBC: Recent Labs  Lab 01/19/24 1944 01/19/24 1948  WBC 14.9*  --   NEUTROABS  14.5*  --   HGB 12.5* 12.9*  12.2*  HCT 39.6 38.0*  36.0*  MCV 99.5  --   PLT 178  --     Basic Metabolic Panel: Recent Labs  Lab 01/19/24 1944 01/19/24 1948  NA 138 137  135  K 3.9 3.8  3.8  CL 106 107  CO2 16*  --   GLUCOSE 120* 115*  BUN 21 23  CREATININE 1.75* 1.60*  CALCIUM  8.7*  --    GFR: Estimated Creatinine Clearance: 25.1 mL/min (A) (by C-G formula based on SCr of 1.6 mg/dL (H)). Recent Labs  Lab 01/19/24 1944 01/19/24 1949 01/19/24 2200  WBC 14.9*  --   --   LATICACIDVEN  --  5.1* 7.0*    Liver Function Tests: Recent Labs  Lab 01/19/24 1944  AST 54*  ALT 38  ALKPHOS 164*  BILITOT 1.0  PROT 5.0*  ALBUMIN 2.7*   Recent Labs  Lab 01/19/24 1944  LIPASE 20   No results for input(s): AMMONIA in the last 168 hours.  ABG    Component Value Date/Time   HCO3 15.8 (L) 01/19/2024 1948   TCO2 16 (L) 01/19/2024 1948   TCO2 16 (L) 01/19/2024 1948   ACIDBASEDEF 5.0 (H) 01/19/2024 1948   O2SAT 74 01/19/2024 1948     Coagulation Profile: Recent Labs  Lab 01/19/24 1944  INR 1.3*    Cardiac Enzymes: No results for input(s): CKTOTAL, CKMB, CKMBINDEX, TROPONINI in the last 168 hours.  HbA1C: No results found for: HGBA1C  CBG: No results for input(s): GLUCAP in the last 168 hours.  Review of Systems:   ***  Past Medical History:  He,  has a past medical history of AAA (abdominal aortic aneurysm), Aneurysm, Collagen vascular disease, Colonic polyp, History of nephrolithiasis, Hyperlipidemia, Hypertension, Macular degeneration, Osteoarthrosis, hip, and Paget's disease.   Surgical History:   Past Surgical History:  Procedure Laterality Date   ABDOMINAL AORTIC ANEURYSM REPAIR     DOPPLER ECHOCARDIOGRAPHY  2006   Ef greater than 60%. There are no regional wall motion abnormalities. There was mild mitral regurgitation, there is no aortic stenosis, aortic valve was mildly calcified   HERNIA REPAIR  1998   LOWER EXTREMITY  ANGIOGRAPHY Left 01/30/2023   Procedure: Lower Extremity Angiography;  Surgeon: Marea Selinda RAMAN, MD;  Location: [facility];  Service: Cardiovascular;  Laterality: Left;   Lung Hamartoma       Social History:   reports that he has quit smoking. He has never used smokeless tobacco. He reports current alcohol use.   Family History:  His family history includes Alzheimer's disease in his father; Heart attack (age of onset: 52) in his mother.   Allergies Allergies  Allergen Reactions   Ivp Dye [Iodinated Contrast Media]    Simvastatin     REACTION: joint pains   Soap Itching    fragrant soaps     Home Medications  Prior to Admission medications   Medication Sig Start Date End Date Taking? Authorizing Provider  acetaminophen  (TYLENOL ) 500 MG tablet Take 2 tablets (1,000 mg total) by mouth every 6 (six) hours as needed. 12/12/23   Angiulli, Toribio PARAS, PA-C  aspirin  EC 81 MG tablet Take 1 tablet (81 mg total) by mouth daily. Swallow whole. 12/13/23   Angiulli, Toribio PARAS, PA-C  atorvastatin  (LIPITOR) 40 MG tablet Take 1 tablet (40 mg total) by mouth daily. 12/19/23   Angiulli, Toribio PARAS, PA-C  clopidogrel  (PLAVIX ) 75 MG tablet Take 1 tablet (75 mg total) by mouth daily. 12/19/23   Angiulli, Toribio PARAS, PA-C  cyclobenzaprine  (FLEXERIL ) 5 MG tablet Take 1 tablet (5 mg total) by mouth 3 (three) times daily. 12/19/23   Angiulli, Toribio PARAS, PA-C  diclofenac  Sodium (VOLTAREN ) 1 % GEL Apply 2 g topically 4 (four) times daily. 12/19/23   Angiulli, Toribio PARAS, PA-C  diltiazem  (CARDIZEM  CD) 240 MG 24 hr capsule Take 1 capsule (240 mg total) by mouth daily. 12/19/23 02/17/25  Angiulli, Toribio PARAS, PA-C  lidocaine  (LIDODERM ) 5 % Place 2 patches onto the skin daily. Remove & Discard patch within 12 hours or as directed by MD 12/19/23   Angiulli, Daniel J, PA-C  Multiple Vitamin (MULTIVITAMIN PO) Take 1 tablet by mouth daily.    [provider]  oxyCODONE  (OXY IR/ROXICODONE ) 5 MG immediate release  tablet Take 1 tablet (5 mg total) by mouth every 4 (four) hours as needed for moderate pain (pain score 4-6) or breakthrough pain. 12/19/23   Angiulli, Toribio PARAS, PA-C  polyethylene glycol (MIRALAX  / GLYCOLAX ) 17 g packet Take 17 g by mouth daily as needed for moderate constipation. 12/12/23   Angiulli, Toribio PARAS, PA-C  senna-docusate (SENOKOT-S) 8.6-50 MG tablet Take 1 tablet by mouth at bedtime. 12/18/23   Angiulli, Toribio PARAS, PA-C  tamsulosin  (FLOMAX ) 0.4 MG CAPS capsule Take 1 capsule (0.4 mg total) by mouth daily after supper. 12/19/23   Angiulli, Toribio PARAS, PA-C  topiramate  (TOPAMAX ) 25 MG tablet Take 1 tablet (25 mg total) by mouth 2 (two) times daily as needed (headache). 12/19/23   Angiulli, Toribio PARAS, PA-C  traZODone  (DESYREL ) 50 MG tablet Take 1 tablet (50 mg total) by mouth at bedtime. 12/19/23   Pegge Toribio PARAS, PA-C     Critical care time: ***

## 2024-01-19 NOTE — ED Notes (Signed)
Back to room from CT  

## 2024-01-19 NOTE — Progress Notes (Signed)
 Orthopedic Tech Progress Note Patient Details:  Jerry [name] [birth year] [id number]  Patient ID: Jerry [name], male   DOB: 26-Dec-1928, 89 y.o.   MRN: [id number] Level 2 trauma  Jerry [name] 01/19/2024, 7:49 PM

## 2024-01-19 NOTE — ED Notes (Signed)
 Pt to CT with this nurse. Monitored.

## 2024-01-19 NOTE — ED Notes (Signed)
 Family at bedside and let us  know that he has been complaining of back pain for a couple of weeks now.

## 2024-01-19 NOTE — Anesthesia Preprocedure Evaluation (Addendum)
 Anesthesia Evaluation  Patient identified by MRN, date of birth, ID band Patient confused    Reviewed: Allergy & Precautions, NPO status , Patient's Chart, lab work & pertinent test results  History of Anesthesia Complications Negative for: history of anesthetic complications  Airway Mallampati: Unable to assess  TM Distance: >3 FB Neck ROM: Full    Dental  (+) Dental Advisory Given   Pulmonary former smoker    + wheezing      Cardiovascular hypertension, Pt. on medications  Rhythm:Regular Rate:Tachycardia     Neuro/Psych  Hearing loss   negative psych ROS   GI/Hepatic negative GI ROS, Neg liver ROS,,,  Endo/Other   Pre-DM   Renal/GU Renal InsufficiencyRenal disease     Musculoskeletal  (+) Arthritis ,   Paget's disease Collagen vascular disease    Abdominal   Peds  Hematology  (+) Blood dyscrasia, anemia  INR 1.3 On plavix     Anesthesia Other Findings Raynaud's disease   Reproductive/Obstetrics                              Anesthesia Physical Anesthesia Plan  ASA: 2 and emergent  Anesthesia Plan: General   Post-op Pain Management: Ofirmev  IV (intra-op)*   Induction: Intravenous and Rapid sequence  PONV Risk Score and Plan: 2 and Treatment may vary due to age or medical condition, Ondansetron  and TIVA  Airway Management Planned: Oral ETT  Additional Equipment: None  Intra-op Plan:   Post-operative Plan: Extubation in OR  Informed Consent: I have reviewed the patients History and Physical, chart, labs and discussed the procedure including the risks, benefits and alternatives for the proposed anesthesia with the patient or authorized representative who has indicated his/her understanding and acceptance.     Dental advisory given and Consent reviewed with POA  Plan Discussed with: CRNA and Anesthesiologist  Anesthesia Plan Comments:          Anesthesia  Quick Evaluation

## 2024-01-19 NOTE — ED Provider Notes (Signed)
 I saw and evaluated the patient, reviewed the resident's note and I agree with the findings and plan.      89 year old male presents after a fall at home.  Patient is on Plavix .  Was found to be febrile and he is on Plavix .  Temp here is 103.  Code sepsis initiated.  Workup is pending at this time but patient will require admission   Dasie Faden, MD 01/19/24 1947

## 2024-01-19 NOTE — Consult Note (Signed)
 Subjective: 1. Acute pyelonephritis   2. Left nephrolithiasis   3. AKI (acute kidney injury)   4. Sepsis, due to unspecified organism, unspecified whether acute organ dysfunction present [facility])      Consult requested by Dr. Morene Barrio  The patient is a 89 yo male who came to the ER with delerium.  W/u demonstrated a 2mm obstructing left proximal ureteral stone with obstruction and sepsis with an infected urine and a culture with e. Coli at a prior ER visit in October for a trauma.   On my review of the CT there is a possible non-obstructing right proximal stone as well.   He is on plavix  and is also on topomax.  ROS:  Review of Systems  Constitutional:  Positive for fever.  Respiratory:  Positive for shortness of breath.   Cardiovascular:  Negative for chest pain.  Musculoskeletal:  Positive for back pain.  Neurological:        Delerium  All other systems reviewed and are negative.   Allergies  Allergen Reactions   Ivp Dye [Iodinated Contrast Media]    Simvastatin     REACTION: joint pains   Soap Itching    fragrant soaps    Past Medical History:  Diagnosis Date   AAA (abdominal aortic aneurysm)    Aneurysm    History of multiple vascular aneurysms. [name] involve the aorta, te iliac arteries, and the popliteal arteries.  The pt is s/p stent graft of an abdominal aortic aneurysm. All of his aneurysm follow up as been done at [facility].   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 This is manifested and is elevated, sed rate, pleuritis, Raynaud's pehnomenon and she does have a positive rheumatoid factor.  He is being treated with Plquenil for his collagen vascular disease   Colonic polyp    History of nephrolithiasis    Hyperlipidemia    Hypertension    Macular degeneration    Osteoarthrosis, hip    Left hip   Paget's disease     Past Surgical History:  Procedure Laterality Date   ABDOMINAL AORTIC ANEURYSM REPAIR     DOPPLER  ECHOCARDIOGRAPHY  2006   Ef greater than 60%. There are no regional wall motion abnormalities. There was mild mitral regurgitation, there is no aortic stenosis, aortic valve was mildly calcified   HERNIA REPAIR  1998   LOWER EXTREMITY ANGIOGRAPHY Left 01/30/2023   Procedure: Lower Extremity Angiography;  Surgeon: Marea Selinda RAMAN, MD;  Location: [facility] INVASIVE CV LAB;  Service: Cardiovascular;  Laterality: Left;   Lung Hamartoma      Social History   Socioeconomic History   Marital status: Married    Spouse name: Not on file   Number of children: Not on file   Years of education: Not on file   Highest education level: Not on file  Occupational History   Occupation: Retired  Tobacco Use   Smoking status: Former   Smokeless tobacco: Never   Tobacco comments:    Quit smoking more than 40 years ago  Psychologist, Educational Use   Vaping status: Not on file  Substance and Sexual Activity   Alcohol use: Yes    Comment: Rarely   Drug use: Not on file   Sexual activity: Not on file  Other Topics Concern   Not on file  Social History Narrative   Married and lives in [place]   3 children and grandchildren   Social Drivers of Health  Financial Resource Strain: Low Risk  (04/07/2023)   Received from [facility]   Overall Financial Resource Strain (CARDIA)    Difficulty of Paying Living Expenses: Not hard at all  Food Insecurity: No Food Insecurity (12/01/2023)   Hunger Vital Sign    Worried About Running Out of Food in the Last Year: Never true    Ran Out of Food in the Last Year: Never true  Transportation Needs: No Transportation Needs (12/01/2023)   PRAPARE - Administrator, Civil Service (Medical): No    Lack of Transportation (Non-Medical): No  Physical Activity: Not on file  Stress: Not on file  Social Connections: Socially Integrated (12/01/2023)   Social Connection and Isolation Panel    Frequency of Communication with Friends and Family: More than three  times a week    Frequency of Social Gatherings with Friends and Family: Twice a week    Attends Religious Services: More than 4 times per year    Active Member of [employer] or Organizations: Yes    Attends Engineer, Structural: More than 4 times per year    Marital Status: Married  Catering Manager Violence: Not At Risk (12/01/2023)   Humiliation, Afraid, Rape, and Kick questionnaire    Fear of Current or Ex-Partner: No    Emotionally Abused: No    Physically Abused: No    Sexually Abused: No    Family History  Problem Relation Age of Onset   Heart attack Mother 64       MI   Alzheimer's disease Father     Anti-infectives: Anti-infectives (From admission, onward)    Start     Dose/Rate Route Frequency Ordered Stop   01/19/24 1945  vancomycin (VANCOCIN) IVPB 1000 mg/200 mL premix        1,000 mg 200 mL/hr over 60 Minutes Intravenous  Once 01/19/24 1940 01/19/24 2209   01/19/24 1945  piperacillin-tazobactam (ZOSYN) IVPB 3.375 g        3.375 g 100 mL/hr over 30 Minutes Intravenous  Once 01/19/24 1940 01/19/24 2058       Current Facility-Administered Medications  Medication Dose Route Frequency Provider Last Rate Last Admin   acetaminophen  (TYLENOL ) tablet 1,000 mg  1,000 mg Oral Once Arlee Katz, MD       ipratropium-albuterol (DUONEB) 0.5-2.5 (3) MG/3ML nebulizer solution 3 mL  3 mL Nebulization Q20 Amadeo Arlee Katz, MD       lactated ringers  infusion   Intravenous Continuous Arlee Katz, MD       Current Outpatient Medications  Medication Sig Dispense Refill   acetaminophen  (TYLENOL ) 500 MG tablet Take 2 tablets (1,000 mg total) by mouth every 6 (six) hours as needed. 30 tablet 0   aspirin  EC 81 MG tablet Take 1 tablet (81 mg total) by mouth daily. Swallow whole.     atorvastatin  (LIPITOR) 40 MG tablet Take 1 tablet (40 mg total) by mouth daily. 30 tablet 0   clopidogrel  (PLAVIX ) 75 MG tablet Take 1 tablet (75 mg total) by mouth daily. 30 tablet 0    cyclobenzaprine  (FLEXERIL ) 5 MG tablet Take 1 tablet (5 mg total) by mouth 3 (three) times daily. 90 tablet 0   diclofenac  Sodium (VOLTAREN ) 1 % GEL Apply 2 g topically 4 (four) times daily. 100 g 0   diltiazem  (CARDIZEM  CD) 240 MG 24 hr capsule Take 1 capsule (240 mg total) by mouth daily. 30 capsule 0   lidocaine  (LIDODERM ) 5 % Place 2 patches  onto the skin daily. Remove & Discard patch within 12 hours or as directed by MD 30 patch 0   Multiple Vitamin (MULTIVITAMIN PO) Take 1 tablet by mouth daily.     oxyCODONE  (OXY IR/ROXICODONE ) 5 MG immediate release tablet Take 1 tablet (5 mg total) by mouth every 4 (four) hours as needed for moderate pain (pain score 4-6) or breakthrough pain. 30 tablet 0   polyethylene glycol (MIRALAX  / GLYCOLAX ) 17 g packet Take 17 g by mouth daily as needed for moderate constipation.     senna-docusate (SENOKOT-S) 8.6-50 MG tablet Take 1 tablet by mouth at bedtime.     tamsulosin  (FLOMAX ) 0.4 MG CAPS capsule Take 1 capsule (0.4 mg total) by mouth daily after supper. 30 capsule 0   topiramate  (TOPAMAX ) 25 MG tablet Take 1 tablet (25 mg total) by mouth 2 (two) times daily as needed (headache). 30 tablet 0   traZODone  (DESYREL ) 50 MG tablet Take 1 tablet (50 mg total) by mouth at bedtime. 30 tablet 0     Objective: Vital signs in last 24 hours: BP (!) 123/90   Pulse 95   Temp (!) 103 F (39.4 C) (Rectal)   Resp (!) 24   Ht 5' 5 (1.651 m)   Wt 64 kg   SpO2 100%   BMI 23.46 kg/m   Intake/Output from previous day: No intake/output data recorded. Intake/Output this shift: Total I/O In: 1043.9 [IV Piggyback:1043.9] Out: -    Physical Exam Vitals reviewed.  Constitutional:      General: He is in acute distress.     Appearance: He is toxic-appearing.  Cardiovascular:     Rate and Rhythm: Regular rhythm. Tachycardia present.  Pulmonary:     Effort: Respiratory distress present.     Comments: tachypnea Abdominal:     General: Abdomen is flat.      Palpations: Abdomen is soft.     Tenderness: There is no abdominal tenderness.  Skin:    General: Skin is warm and dry.  Neurological:     General: No focal deficit present.     Mental Status: He is alert. He is disoriented.     Lab Results:  Results for orders placed or performed during the hospital encounter of 01/19/24 (from the past 24 hours)  Urinalysis, w/ Reflex to Culture (Infection Suspected) -Urine, Clean Catch     Status: Abnormal   Collection Time: 01/19/24  7:34 PM  Result Value Ref Range   Specimen Source URINE, CLEAN CATCH    Color, Urine YELLOW YELLOW   APPearance HAZY (A) CLEAR   Specific Gravity, Urine 1.019 1.005 - 1.030   pH 5.0 5.0 - 8.0   Glucose, UA NEGATIVE NEGATIVE mg/dL   Hgb urine dipstick NEGATIVE NEGATIVE   Bilirubin Urine NEGATIVE NEGATIVE   Ketones, ur 5 (A) NEGATIVE mg/dL   Protein, ur NEGATIVE NEGATIVE mg/dL   Nitrite NEGATIVE NEGATIVE   Leukocytes,Ua MODERATE (A) NEGATIVE   RBC / HPF 6-10 0 - 5 RBC/hpf   WBC, UA 21-50 0 - 5 WBC/hpf   Bacteria, UA RARE (A) NONE SEEN   Squamous Epithelial / HPF 0-5 0 - 5 /HPF   Mucus PRESENT   Comprehensive metabolic panel     Status: Abnormal   Collection Time: 01/19/24  7:44 PM  Result Value Ref Range   Sodium 138 135 - 145 mmol/L   Potassium 3.9 3.5 - 5.1 mmol/L   Chloride 106 98 - 111 mmol/L   CO2 16 (L) 22 -  32 mmol/L   Glucose, Bld 120 (H) 70 - 99 mg/dL   BUN 21 8 - 23 mg/dL   Creatinine, Ser 8.24 (H) 0.61 - 1.24 mg/dL   Calcium  8.7 (L) 8.9 - 10.3 mg/dL   Total Protein 5.0 (L) 6.5 - 8.1 g/dL   Albumin 2.7 (L) 3.5 - 5.0 g/dL   AST 54 (H) 15 - 41 U/L   ALT 38 0 - 44 U/L   Alkaline Phosphatase 164 (H) 38 - 126 U/L   Total Bilirubin 1.0 0.0 - 1.2 mg/dL   GFR, Estimated 36 (L) >60 mL/min   Anion gap 16 (H) 5 - 15  CBC with Differential     Status: Abnormal   Collection Time: 01/19/24  7:44 PM  Result Value Ref Range   WBC 14.9 (H) 4.0 - 10.5 K/uL   RBC 3.98 (L) 4.22 - 5.81 MIL/uL   Hemoglobin  12.5 (L) 13.0 - 17.0 g/dL   HCT 60.3 60.9 - 47.9 %   MCV 99.5 80.0 - 100.0 fL   MCH 31.4 26.0 - 34.0 pg   MCHC 31.6 30.0 - 36.0 g/dL   RDW 85.7 88.4 - 84.4 %   Platelets 178 150 - 400 K/uL   nRBC 0.0 0.0 - 0.2 %   Neutrophils Relative % 97 %   Neutro Abs 14.5 (H) 1.7 - 7.7 K/uL   Lymphocytes Relative 1 %   Lymphs Abs 0.1 (L) 0.7 - 4.0 K/uL   Monocytes Relative 1 %   Monocytes Absolute 0.1 0.1 - 1.0 K/uL   Eosinophils Relative 0 %   Eosinophils Absolute 0.0 0.0 - 0.5 K/uL   Basophils Relative 0 %   Basophils Absolute 0.0 0.0 - 0.1 K/uL   Immature Granulocytes 1 %   Abs Immature Granulocytes 0.13 (H) 0.00 - 0.07 K/uL  Protime-INR     Status: Abnormal   Collection Time: 01/19/24  7:44 PM  Result Value Ref Range   Prothrombin Time 16.4 (H) 11.4 - 15.2 seconds   INR 1.3 (H) 0.8 - 1.2  Lipase, blood     Status: None   Collection Time: 01/19/24  7:44 PM  Result Value Ref Range   Lipase 20 11 - 51 U/L  Brain natriuretic peptide     Status: Abnormal   Collection Time: 01/19/24  7:44 PM  Result Value Ref Range   B Natriuretic Peptide 292.3 (H) 0.0 - 100.0 pg/mL  I-Stat Chem 8, ED     Status: Abnormal   Collection Time: 01/19/24  7:48 PM  Result Value Ref Range   Sodium 137 135 - 145 mmol/L   Potassium 3.8 3.5 - 5.1 mmol/L   Chloride 107 98 - 111 mmol/L   BUN 23 8 - 23 mg/dL   Creatinine, Ser 8.39 (H) 0.61 - 1.24 mg/dL   Glucose, Bld 884 (H) 70 - 99 mg/dL   Calcium , Ion 1.02 (L) 1.15 - 1.40 mmol/L   TCO2 16 (L) 22 - 32 mmol/L   Hemoglobin 12.9 (L) 13.0 - 17.0 g/dL   HCT 61.9 (L) 60.9 - 47.9 %  I-Stat venous blood gas, ED ([facility])     Status: Abnormal   Collection Time: 01/19/24  7:48 PM  Result Value Ref Range   pH, Ven 7.518 (H) 7.25 - 7.43   pCO2, Ven 19.5 (LL) 44 - 60 mmHg   pO2, Ven 33 32 - 45 mmHg   Bicarbonate 15.8 (L) 20.0 - 28.0 mmol/L   TCO2 16 (L) 22 - 32  mmol/L   O2 Saturation 74 %   Acid-base deficit 5.0 (H) 0.0 - 2.0 mmol/L   Sodium 135 135 - 145 mmol/L    Potassium 3.8 3.5 - 5.1 mmol/L   Calcium , Ion 1.00 (L) 1.15 - 1.40 mmol/L   HCT 36.0 (L) 39.0 - 52.0 %   Hemoglobin 12.2 (L) 13.0 - 17.0 g/dL   Sample type VENOUS    Comment NOTIFIED PHYSICIAN   I-Stat Lactic Acid, ED     Status: Abnormal   Collection Time: 01/19/24  7:49 PM  Result Value Ref Range   Lactic Acid, Venous 5.1 (HH) 0.5 - 1.9 mmol/L   Comment NOTIFIED PHYSICIAN   Resp panel by RT-PCR (RSV, Flu A&B, Covid) Anterior Nasal Swab     Status: None   Collection Time: 01/19/24  8:29 PM   Specimen: Anterior Nasal Swab  Result Value Ref Range   SARS Coronavirus 2 by RT PCR NEGATIVE NEGATIVE   Influenza A by PCR NEGATIVE NEGATIVE   Influenza B by PCR NEGATIVE NEGATIVE   Resp Syncytial Virus by PCR NEGATIVE NEGATIVE  I-Stat Lactic Acid, ED     Status: Abnormal   Collection Time: 01/19/24 10:00 PM  Result Value Ref Range   Lactic Acid, Venous 7.0 (HH) 0.5 - 1.9 mmol/L   Comment NOTIFIED PHYSICIAN     BMET Recent Labs    01/19/24 1944 01/19/24 1948  NA 138 137  135  K 3.9 3.8  3.8  CL 106 107  CO2 16*  --   GLUCOSE 120* 115*  BUN 21 23  CREATININE 1.75* 1.60*  CALCIUM  8.7*  --    PT/INR Recent Labs    01/19/24 1944  LABPROT 16.4*  INR 1.3*   ABG Recent Labs    01/19/24 1948  HCO3 15.8*    Studies/Results: CT Cervical Spine Wo Contrast Result Date: 01/19/2024 EXAM: CT CERVICAL SPINE WITHOUT CONTRAST 01/19/2024 09:42:30 PM TECHNIQUE: CT of the cervical spine was performed without the administration of intravenous contrast. Multiplanar reformatted images are provided for review. Automated exposure control, iterative reconstruction, and/or weight based adjustment of the mA/kV was utilized to reduce the radiation dose to as low as reasonably achievable. COMPARISON: Comparison with CT 11/30/2023. CLINICAL HISTORY: level 2 on thinners FINDINGS: CERVICAL SPINE: BONES AND ALIGNMENT: Chronic anterolisthesis of C3. No acute fracture or traumatic malalignment.  DEGENERATIVE CHANGES: Multilevel spondylosis, disc space height loss, and degenerative endplate changes which is greatest at C4-C5. Small multilevel facet arthropathy. No severe spinal canal narrowing. SOFT TISSUES: No prevertebral soft tissue swelling. IMPRESSION: 1. No acute abnormality of the cervical spine. Electronically signed by: Norman Gatlin MD 01/19/2024 10:10 PM EST RP Workstation: [id number]   CT Head Wo Contrast Result Date: 01/19/2024 EXAM: CT HEAD WITHOUT CONTRAST 01/19/2024 09:42:30 PM TECHNIQUE: CT of the head was performed without the administration of intravenous contrast. Automated exposure control, iterative reconstruction, and/or weight based adjustment of the mA/kV was utilized to reduce the radiation dose to as low as reasonably achievable. COMPARISON: CT 11/30/2023 CLINICAL HISTORY: fall, delirium FINDINGS: BRAIN AND VENTRICLES: No acute hemorrhage. No evidence of acute infarct. No hydrocephalus. No extra-axial collection. No mass effect or midline shift. Chronic microvascular ischemia and generalized atrophy. ORBITS: No acute abnormality. SINUSES: No acute abnormality. SOFT TISSUES AND SKULL: No acute soft tissue abnormality. No skull fracture. IMPRESSION: 1. No acute intracranial abnormality. Electronically signed by: Norman Gatlin MD 01/19/2024 10:06 PM EST RP Workstation: [id number]   CT CHEST ABDOMEN PELVIS WO CONTRAST Result Date: 01/19/2024  CLINICAL DATA:  Fall EXAM: CT CHEST, ABDOMEN AND PELVIS WITHOUT CONTRAST TECHNIQUE: Multidetector CT imaging of the chest, abdomen and pelvis was performed following the standard protocol without IV contrast. RADIATION DOSE REDUCTION: This exam was performed according to the departmental dose-optimization program which includes automated exposure control, adjustment of the mA and/or kV according to patient size and/or use of iterative reconstruction technique. COMPARISON:  CT renal stone 12/13/2023. CT chest abdomen and pelvis 11/30/2023.  FINDINGS: CT CHEST FINDINGS Cardiovascular: No significant vascular findings. Normal heart size. No pericardial effusion. There are atherosclerotic calcifications of the aorta and coronary arteries. Mediastinum/Nodes: No enlarged mediastinal, hilar, or axillary lymph nodes. Thyroid gland, trachea, and esophagus demonstrate no significant findings. There is a small hiatal hernia. Lungs/Pleura: There are atelectatic changes in the bilateral lower lobes. There some calcified pleural plaques in the inferior right lower hemithorax. There is scarring in both lung apices. There is no pleural effusion or pneumothorax identified. 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 Bones are diffusely osteopenic. There are healing/healed posterior right fourth through tenth rib fractures. There also multiple healing/healed anterior right rib fractures. There is questionable acute nondisplaced anterior right second rib fracture there are healing posterior left a, ninth and eleventh rib fractures. There is age indeterminate posterior left tenth rib fracture. These all appear new from 11/30/2023. CT ABDOMEN PELVIS FINDINGS Hepatobiliary: Gallstones are present. No focal liver lesion. No biliary ductal dilatation. Pancreas: There is diffuse fatty infiltration of the pancreas. Spleen: Normal in size without focal abnormality. Adrenals/Urinary Tract: There is a 2 mm calculus in the mid left ureter with mild left-sided hydronephrosis and left perinephric stranding and fluid. The adrenal glands, right kidney and bladder are within normal limits. Stomach/Bowel: Stomach is within normal limits. Appendix appears normal. No evidence of bowel wall thickening, distention, or inflammatory changes. There is sigmoid colon diverticulosis. There is a small hiatal hernia. Vascular/Lymphatic: There severe atherosclerotic calcifications of the aorta. Abdominal aortic aneurysm measures 4.0 cm,  unchanged. 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 No enlarged lymph nodes are identified. Reproductive: Prostate gland is enlarged. Other: No abdominal wall hernia or abnormality. No abdominopelvic ascites. Musculoskeletal: The bones are diffusely osteopenic. No acute fractures are identified. 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 IMPRESSION: 1. 2 mm calculus in the mid left ureter with mild obstructive uropathy. 2. Multiple healing/healed bilateral rib fractures. There is questionable acute nondisplaced anterior right second rib fracture. 3. Age-indeterminate posterior left tenth rib fracture. 4. Cholelithiasis. 5. Stable abdominal aortic aneurysm measuring 4.0 cm. 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 6. Aortic atherosclerosis. Aortic Atherosclerosis (ICD10-I70.0). Electronically Signed   By: Greig Pique M.D.   On: 01/19/2024 20:31   DG Chest Port 1 View Result Date: 01/19/2024 EXAM: 1 VIEW(S) XRAY OF THE CHEST 01/19/2024 07:47:00 PM COMPARISON: 12/03/2023 CLINICAL HISTORY: Questionable sepsis - evaluate for abnormality FINDINGS: LUNGS AND PLEURA: Surgical suture at right lung base. Chronic interstitial coarsening. Chronic scarring at right lung base. No pleural effusion. No pneumothorax. HEART AND MEDIASTINUM: Aortic atherosclerosis is present. The cardiac silhouette is unremarkable. BONES AND SOFT TISSUES: No acute osseous abnormality. IMPRESSION: 1. No acute cardiopulmonary process identified. 2. 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 Electronically signed by: Norman Gatlin MD 01/19/2024 07:58 PM  EST RP Workstation: [id number]     Assessment/Plan: Left ureteral stone with obstruction and sepsis.  Possible right ureteral stone.  On plavix .  He needs  cystoscopy with bilateral RTG's and left with possible right ureteral stent.   I have reviewed the risks of the procedure including bleeding, infection, ureteral injury, need for secondary procedures, thrombotic events, and anesthetic complications.   I explained that following stenting he could have a rapid recovery or further decline and it is difficult to predict at this point.         No follow-ups on file.    CC: Dr. Morene Arlee Norleen Watt 01/19/2024

## 2024-01-19 NOTE — ED Provider Notes (Signed)
 [facility] Provider Note   CSN: [id number] Arrival date & time: 01/19/24  8069     Patient presents with: Fall and Altered Mental Status   Jerry [name] is a 89 y.o. male. 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  History per EMS, history from patient is limited secondary to mental status.  Per report, patient was discharged 2 weeks ago, and was doing well, normally is oriented x 3.  Today, patient [place], however while patient went to the bathroom family reportedly heard a fall, and found the patient on the floor.  Possible LOC, possible head injury however it was unwitnessed.  Patient has been compliant with his Plavix .  EMS was called.  With EMS, patient was very delirious, not able to answer questions appropriately, and had a temperature of 103.3 F through axilla.  Patient was activated as a level 2 status post fall with head injury, delirium while on blood thinners.  Patient unable to answer any questions at this time, however airway is maintained.  Mildly hypotensive, tachycardic on arrival, and febrile.  {Add pertinent medical, surgical, social history, OB history to YEP:67052}  Fall  Altered Mental Status      Prior to Admission medications   Medication Sig Start Date End Date Taking? Authorizing Provider  acetaminophen  (TYLENOL ) 500 MG tablet Take 2 tablets (1,000 mg total) by mouth every 6 (six) hours as needed. 12/12/23   Angiulli, Toribio PARAS, PA-C  aspirin  EC 81 MG tablet Take 1 tablet (81 mg total) by mouth daily. Swallow whole. 12/13/23   Angiulli, Toribio PARAS, PA-C  atorvastatin  (LIPITOR) 40 MG tablet Take 1 tablet (40 mg total) by mouth daily. 12/19/23   Angiulli, Toribio PARAS, PA-C  clopidogrel  (PLAVIX ) 75 MG tablet Take 1 tablet (75 mg total) by mouth daily. 12/19/23   Angiulli, Toribio PARAS, PA-C  cyclobenzaprine  (FLEXERIL ) 5 MG tablet Take 1 tablet (5 mg total) by mouth 3 (three) times daily. 12/19/23   Angiulli, Toribio PARAS, PA-C  diclofenac  Sodium (VOLTAREN ) 1 % GEL Apply 2 g topically 4 (four) times daily. 12/19/23   Angiulli, Toribio PARAS, PA-C  diltiazem  (CARDIZEM  CD) 240 MG 24 hr capsule Take 1 capsule (240 mg total) by mouth daily. 12/19/23 02/17/25  Angiulli, Toribio PARAS, PA-C  lidocaine  (LIDODERM ) 5 % Place 2 patches onto the skin daily. Remove & Discard patch within 12 hours or as directed by MD 12/19/23   Angiulli, Daniel J, PA-C  Multiple Vitamin (MULTIVITAMIN PO) Take 1 tablet by mouth daily.    [provider]  oxyCODONE  (OXY IR/ROXICODONE ) 5 MG immediate release tablet Take 1 tablet (5 mg total) by mouth every 4 (four) hours as needed for moderate pain (pain score 4-6) or breakthrough pain. 12/19/23   Angiulli, Daniel J, PA-C  polyethylene glycol (MIRALAX  / GLYCOLAX ) 17 g packet Take 17 g by mouth daily as needed for moderate constipation. 12/12/23   Angiulli, Toribio PARAS, PA-C  senna-docusate (SENOKOT-S) 8.6-50 MG tablet Take 1 tablet by mouth at bedtime. 12/18/23   Angiulli, Toribio PARAS, PA-C  tamsulosin  (FLOMAX ) 0.4 MG CAPS capsule Take 1 capsule (0.4 mg total) by mouth daily  after supper. 12/19/23   Angiulli, Toribio PARAS, PA-C  topiramate  (TOPAMAX ) 25 MG tablet Take 1 tablet (25 mg total) by mouth 2 (two) times daily as needed (headache). 12/19/23   Angiulli, Toribio PARAS, PA-C  traZODone  (DESYREL ) 50 MG tablet Take 1 tablet (50 mg total) by mouth at bedtime. 12/19/23   Angiulli, Toribio PARAS, PA-C    Allergies: Ivp dye [iodinated contrast media], Simvastatin, and Soap    Review of Systems  Updated Vital Signs There were no vitals taken for this  visit.  Physical Exam Vitals and nursing note reviewed.  Constitutional:      General: He is in acute distress.     Appearance: He is ill-appearing and diaphoretic.  HENT:     Head: Normocephalic and atraumatic.     Comments: No lacerations, abrasions, or hematomas appreciated on the scalp.    Mouth/Throat:     Mouth: Mucous membranes are dry.     Pharynx: Oropharynx is clear.  Eyes:     Pupils: Pupils are equal, round, and reactive to light.  Neck:     Comments: Placed in c-collar secondary to fall with delirium Cardiovascular:     Rate and Rhythm: Tachycardia present. Rhythm irregular.     Pulses: Normal pulses.     Heart sounds: Normal heart sounds. No murmur heard.    No gallop.  Pulmonary:     Effort: Pulmonary effort is normal. No respiratory distress.     Breath sounds: Normal breath sounds. No stridor. No wheezing, rhonchi or rales.  Abdominal:     General: Abdomen is flat. There is no distension.     Palpations: Abdomen is soft.     Tenderness: There is no abdominal tenderness. There is no right CVA tenderness, left CVA tenderness or guarding.  Skin:    General: Skin is warm.     Capillary Refill: Capillary refill takes more than 3 seconds.  Neurological:     Comments: Patient is oriented x 0, primarily keeps repeating I do not understand when asked any questions.     (all labs ordered are listed, but only abnormal results are displayed) Labs Reviewed - No data to display  EKG: None  Radiology: No results found.  {Document cardiac monitor, telemetry assessment procedure when appropriate:32947} Procedures   Medications Ordered in the ED - No data to display    {Click here for ABCD2, HEART and other calculators REFRESH Note before signing:1}                              Medical Decision Making Amount and/or Complexity of Data Reviewed Labs: ordered. Radiology: ordered.  Risk Prescription drug management.   ***  {Document critical care time when  appropriate  Document review of labs and clinical decision tools ie CHADS2VASC2, etc  Document your independent review of radiology images and any outside records  Document your discussion with family members, caretakers and with consultants  Document social determinants of health affecting pt's care  Document your decision making why or why not admission, treatments were needed:32947:::1}   Final diagnoses:  None    ED Discharge Orders     None

## 2024-01-19 NOTE — Sepsis Progress Note (Addendum)
 Elink monitoring for the code sepsis protocol.   2208: Notified provider of need to order 3rd lactic acid.

## 2024-01-19 NOTE — ED Notes (Signed)
 Pt back to room from CT

## 2024-01-20 ENCOUNTER — Encounter (HOSPITAL_COMMUNITY): Payer: Self-pay | Admitting: Urology

## 2024-01-20 ENCOUNTER — Inpatient Hospital Stay (HOSPITAL_COMMUNITY)

## 2024-01-20 DIAGNOSIS — R578 Other shock: Secondary | ICD-10-CM

## 2024-01-20 DIAGNOSIS — N10 Acute tubulo-interstitial nephritis: Principal | ICD-10-CM

## 2024-01-20 LAB — HEPATIC FUNCTION PANEL
ALT: 131 U/L — ABNORMAL HIGH (ref 0–44)
AST: 155 U/L — ABNORMAL HIGH (ref 15–41)
Albumin: 2.5 g/dL — ABNORMAL LOW (ref 3.5–5.0)
Alkaline Phosphatase: 134 U/L — ABNORMAL HIGH (ref 38–126)
Bilirubin, Direct: 0.4 mg/dL — ABNORMAL HIGH (ref 0.0–0.2)
Indirect Bilirubin: 0.3 mg/dL (ref 0.3–0.9)
Total Bilirubin: 0.7 mg/dL (ref 0.0–1.2)
Total Protein: 4.7 g/dL — ABNORMAL LOW (ref 6.5–8.1)

## 2024-01-20 LAB — BASIC METABOLIC PANEL WITH GFR
Anion gap: 17 — ABNORMAL HIGH (ref 5–15)
Anion gap: 14 (ref 5–15)
BUN: 24 mg/dL — ABNORMAL HIGH (ref 8–23)
BUN: 33 mg/dL — ABNORMAL HIGH (ref 8–23)
CO2: 13 mmol/L — ABNORMAL LOW (ref 22–32)
CO2: 16 mmol/L — ABNORMAL LOW (ref 22–32)
Calcium: 8.3 mg/dL — ABNORMAL LOW (ref 8.9–10.3)
Calcium: 7.3 mg/dL — ABNORMAL LOW (ref 8.9–10.3)
Chloride: 106 mmol/L (ref 98–111)
Chloride: 104 mmol/L (ref 98–111)
Creatinine, Ser: 1.99 mg/dL — ABNORMAL HIGH (ref 0.61–1.24)
Creatinine, Ser: 2.07 mg/dL — ABNORMAL HIGH (ref 0.61–1.24)
GFR, Estimated: 31 mL/min — ABNORMAL LOW (ref >60)
GFR, Estimated: 29 mL/min — ABNORMAL LOW (ref >60)
Glucose, Bld: 111 mg/dL — ABNORMAL HIGH (ref 70–99)
Glucose, Bld: 162 mg/dL — ABNORMAL HIGH (ref 70–99)
Potassium: 3.7 mmol/L (ref 3.5–5.1)
Potassium: 3.9 mmol/L (ref 3.5–5.1)
Sodium: 136 mmol/L (ref 135–145)
Sodium: 134 mmol/L — ABNORMAL LOW (ref 135–145)

## 2024-01-20 LAB — BLOOD CULTURE ID PANEL (REFLEXED) - BCID2

## 2024-01-20 LAB — CBC
HCT: 37.5 % — ABNORMAL LOW (ref 39.0–52.0)
HCT: 34.8 % — ABNORMAL LOW (ref 39.0–52.0)
Hemoglobin: 11.8 g/dL — ABNORMAL LOW (ref 13.0–17.0)
Hemoglobin: 11.4 g/dL — ABNORMAL LOW (ref 13.0–17.0)
MCH: 32.2 pg (ref 26.0–34.0)
MCH: 31.7 pg (ref 26.0–34.0)
MCHC: 31.5 g/dL (ref 30.0–36.0)
MCHC: 32.8 g/dL (ref 30.0–36.0)
MCV: 102.2 fL — ABNORMAL HIGH (ref 80.0–100.0)
MCV: 96.7 fL (ref 80.0–100.0)
Platelets: 103 K/uL — ABNORMAL LOW (ref 150–400)
Platelets: 81 K/uL — ABNORMAL LOW (ref 150–400)
RBC: 3.67 MIL/uL — ABNORMAL LOW (ref 4.22–5.81)
RBC: 3.6 MIL/uL — ABNORMAL LOW (ref 4.22–5.81)
RDW: 14.6 % (ref 11.5–15.5)
RDW: 14.6 % (ref 11.5–15.5)
WBC: 20.5 K/uL — ABNORMAL HIGH (ref 4.0–10.5)
WBC: 40.4 K/uL — ABNORMAL HIGH (ref 4.0–10.5)
nRBC: 0 % (ref 0.0–0.2)
nRBC: 0 % (ref 0.0–0.2)

## 2024-01-20 LAB — MRSA NEXT GEN BY PCR, NASAL: MRSA by PCR Next Gen: NOT DETECTED

## 2024-01-20 LAB — GLUCOSE, CAPILLARY: Glucose-Capillary: 93 mg/dL (ref 70–99)

## 2024-01-20 LAB — DIC (DISSEMINATED INTRAVASCULAR COAGULATION)PANEL
D-Dimer, Quant: >20 ug{FEU}/mL — ABNORMAL HIGH (ref 0.00–0.50)
Fibrinogen: 235 mg/dL (ref 210–475)
INR: 2 — ABNORMAL HIGH (ref 0.8–1.2)
Platelets: 90 K/uL — ABNORMAL LOW (ref 150–400)
Prothrombin Time: 24 s — ABNORMAL HIGH (ref 11.4–15.2)
Smear Review: NONE SEEN
aPTT: 65 s — ABNORMAL HIGH (ref 24–36)

## 2024-01-20 LAB — COOXEMETRY PANEL
Carboxyhemoglobin: 1.5 % (ref 0.5–1.5)
Carboxyhemoglobin: 2.3 % — ABNORMAL HIGH (ref 0.5–1.5)
Methemoglobin: <0.7 % (ref 0.0–1.5)
Methemoglobin: 1.1 % (ref 0.0–1.5)
O2 Saturation: 56.9 %
O2 Saturation: 76 %
Total hemoglobin: 12 g/dL (ref 12.0–16.0)
Total hemoglobin: 11.9 g/dL — ABNORMAL LOW (ref 12.0–16.0)

## 2024-01-20 LAB — ECHOCARDIOGRAM COMPLETE
AR max vel: 1.48 cm2
AV Area VTI: 1.36 cm2
AV Area mean vel: 1.39 cm2
AV Mean grad: 4.5 mmHg
AV Peak grad: 8.5 mmHg
Ao pk vel: 1.46 m/s
Area-P 1/2: 3.12 cm2
Height: 65 in
S' Lateral: 3 cm
Single Plane A4C EF: 39.6 %
Weight: 2560.86 [oz_av]

## 2024-01-20 LAB — TROPONIN I (HIGH SENSITIVITY): Troponin I (High Sensitivity): 147 ng/L (ref <18)

## 2024-01-20 LAB — LACTIC ACID, PLASMA
Lactic Acid, Venous: 7.8 mmol/L (ref 0.5–1.9)
Lactic Acid, Venous: 7 mmol/L (ref 0.5–1.9)
Lactic Acid, Venous: 6.3 mmol/L (ref 0.5–1.9)

## 2024-01-20 LAB — CORTISOL: Cortisol, Plasma: 44.9 ug/dL

## 2024-01-20 LAB — BRAIN NATRIURETIC PEPTIDE: B Natriuretic Peptide: 1086.3 pg/mL — ABNORMAL HIGH (ref 0.0–100.0)

## 2024-01-20 LAB — MAGNESIUM: Magnesium: 1.5 mg/dL — ABNORMAL LOW (ref 1.7–2.4)

## 2024-01-20 LAB — PHOSPHORUS: Phosphorus: 1.5 mg/dL — ABNORMAL LOW (ref 2.5–4.6)

## 2024-01-20 MED ORDER — SODIUM CHLORIDE 0.9 % IV SOLN: 2.0000 g | INTRAVENOUS | Status: DC

## 2024-01-20 MED ORDER — OXYCODONE HCL 5 MG PO TABS: 5.0000 mg | ORAL_TABLET | Freq: Once | ORAL | Status: DC | PRN

## 2024-01-20 MED ORDER — CLOPIDOGREL BISULFATE 75 MG PO TABS: 75.0000 mg | ORAL_TABLET | Freq: Every day | ORAL | Status: DC

## 2024-01-20 MED ORDER — DOCUSATE SODIUM 100 MG PO CAPS
100.0000 mg | ORAL_CAPSULE | Freq: Two times a day (BID) | ORAL | Status: DC | PRN
  Administered 2024-01-23 – 2024-01-26 (×3): 100 mg via ORAL
  Filled 2024-01-20 (×3): qty 1

## 2024-01-20 MED ORDER — SENNOSIDES-DOCUSATE SODIUM 8.6-50 MG PO TABS
1.0000 | ORAL_TABLET | Freq: Every day | ORAL | Status: DC
  Administered 2024-01-22 – 2024-01-26 (×5): 1 via ORAL
  Filled 2024-01-20 (×5): qty 1

## 2024-01-20 MED ORDER — SODIUM CHLORIDE 0.9 % IV SOLN
0.0000 ug/min | INTRAVENOUS | Status: DC
Start: 2024-01-20 — End: 2024-01-20

## 2024-01-20 MED ORDER — ASPIRIN 300 MG RE SUPP: 300.0000 mg | Freq: Every day | RECTAL | Status: DC

## 2024-01-20 MED ORDER — CHLORHEXIDINE GLUCONATE CLOTH 2 % EX PADS
6.0000 | MEDICATED_PAD | Freq: Every day | CUTANEOUS | Status: DC
  Administered 2024-01-20 – 2024-01-29 (×10): 6 via TOPICAL

## 2024-01-20 MED ORDER — MAGNESIUM SULFATE 2 GM/50ML IV SOLN
2.0000 g | Freq: Once | INTRAVENOUS | Status: AC
  Administered 2024-01-20: 2 g via INTRAVENOUS
  Filled 2024-01-20: qty 50

## 2024-01-20 MED ORDER — SODIUM CHLORIDE 0.9 % IV SOLN
250.0000 mL | INTRAVENOUS | Status: AC
  Administered 2024-01-20: 250 mL via INTRAVENOUS

## 2024-01-20 MED ORDER — DOBUTAMINE-DEXTROSE 4-5 MG/ML-% IV SOLN
0.0000 ug/kg/min | INTRAVENOUS | Status: DC
  Administered 2024-01-20: 2.5 ug/kg/min via INTRAVENOUS
  Filled 2024-01-20 (×3): qty 250

## 2024-01-20 MED ORDER — ATORVASTATIN CALCIUM 40 MG PO TABS
40.0000 mg | ORAL_TABLET | Freq: Every day | ORAL | Status: DC
  Administered 2024-01-22 – 2024-01-29 (×8): 40 mg via ORAL
  Filled 2024-01-20 (×8): qty 1

## 2024-01-20 MED ORDER — LACTATED RINGERS IV BOLUS: 1000.0000 mL | Freq: Once | INTRAVENOUS | Status: AC

## 2024-01-20 MED ORDER — NOREPINEPHRINE 4 MG/250ML-% IV SOLN
0.0000 ug/min | INTRAVENOUS | Status: DC
  Administered 2024-01-20: 10 ug/min via INTRAVENOUS
  Filled 2024-01-20: qty 250

## 2024-01-20 MED ORDER — SODIUM CHLORIDE 0.9 % IV SOLN
1.0000 g | Freq: Once | INTRAVENOUS | Status: AC
  Administered 2024-01-20: 1 g via INTRAVENOUS
  Filled 2024-01-20: qty 10

## 2024-01-20 MED ORDER — ONDANSETRON HCL 4 MG/2ML IJ SOLN
INTRAMUSCULAR | Status: AC
  Filled 2024-01-20: qty 2

## 2024-01-20 MED ORDER — FENTANYL CITRATE (PF) 100 MCG/2ML IJ SOLN: 25.0000 ug | INTRAMUSCULAR | Status: DC | PRN

## 2024-01-20 MED ORDER — NOREPINEPHRINE 4 MG/250ML-% IV SOLN
0.0000 ug/min | INTRAVENOUS | Status: DC
  Administered 2024-01-20: 18 ug/min via INTRAVENOUS
  Administered 2024-01-20: 5 ug/min via INTRAVENOUS
  Administered 2024-01-20: 15 ug/min via INTRAVENOUS
  Administered 2024-01-20: 16 ug/min via INTRAVENOUS
  Administered 2024-01-21: 3 ug/min via INTRAVENOUS
  Filled 2024-01-20 (×5): qty 250

## 2024-01-20 MED ORDER — FAMOTIDINE IN NACL 20-0.9 MG/50ML-% IV SOLN: 20.0000 mg | INTRAVENOUS | Status: DC

## 2024-01-20 MED ORDER — POLYETHYLENE GLYCOL 3350 17 G PO PACK: 17.0000 g | PACK | Freq: Every day | ORAL | Status: DC | PRN

## 2024-01-20 MED ORDER — LACTATED RINGERS IV BOLUS
500.0000 mL | Freq: Once | INTRAVENOUS | Status: AC
  Administered 2024-01-20: 500 mL via INTRAVENOUS

## 2024-01-20 MED ORDER — VASOPRESSIN 20 UNITS/100 ML INFUSION FOR SHOCK
0.0000 [IU]/min | INTRAVENOUS | Status: DC
  Administered 2024-01-20: 0.02 [IU]/min via INTRAVENOUS
  Administered 2024-01-20: 0.03 [IU]/min via INTRAVENOUS
  Filled 2024-01-20 (×2): qty 100

## 2024-01-20 MED ORDER — ALBUMIN HUMAN 5 % IV SOLN
INTRAVENOUS | Status: AC
  Filled 2024-01-20: qty 250

## 2024-01-20 MED ORDER — FENTANYL CITRATE (PF) 50 MCG/ML IJ SOSY
12.5000 ug | PREFILLED_SYRINGE | INTRAMUSCULAR | Status: DC | PRN
  Administered 2024-01-20: 12.5 ug via INTRAVENOUS
  Administered 2024-01-21: 25 ug via INTRAVENOUS
  Filled 2024-01-20 (×2): qty 1

## 2024-01-20 MED ORDER — HYDROCORTISONE SOD SUC (PF) 100 MG IJ SOLR
100.0000 mg | Freq: Two times a day (BID) | INTRAMUSCULAR | Status: DC
  Administered 2024-01-20: 100 mg via INTRAVENOUS
  Filled 2024-01-20: qty 2

## 2024-01-20 MED ORDER — HEPARIN SODIUM (PORCINE) 5000 UNIT/ML IJ SOLN
5000.0000 [IU] | Freq: Three times a day (TID) | INTRAMUSCULAR | Status: DC
Start: 2024-01-20 — End: 2024-01-20

## 2024-01-20 MED ORDER — FAMOTIDINE IN NACL 20-0.9 MG/50ML-% IV SOLN
20.0000 mg | Freq: Every day | INTRAVENOUS | Status: DC
  Administered 2024-01-20 – 2024-01-21 (×2): 20 mg via INTRAVENOUS
  Filled 2024-01-20 (×2): qty 50

## 2024-01-20 MED ORDER — ASPIRIN 81 MG PO TBEC: 81.0000 mg | DELAYED_RELEASE_TABLET | Freq: Every day | ORAL | Status: DC

## 2024-01-20 MED ORDER — OXYCODONE HCL 5 MG/5ML PO SOLN: 5.0000 mg | Freq: Once | ORAL | Status: DC | PRN

## 2024-01-20 MED ORDER — SUCCINYLCHOLINE CHLORIDE 200 MG/10ML IV SOSY
PREFILLED_SYRINGE | INTRAVENOUS | Status: AC
  Filled 2024-01-20: qty 10

## 2024-01-20 MED ORDER — ONDANSETRON HCL 4 MG/2ML IJ SOLN: 4.0000 mg | Freq: Once | INTRAMUSCULAR | Status: DC | PRN

## 2024-01-20 MED ORDER — LACTATED RINGERS IV BOLUS
1000.0000 mL | Freq: Once | INTRAVENOUS | Status: AC
  Administered 2024-01-20: 1000 mL via INTRAVENOUS

## 2024-01-20 MED ORDER — ALBUMIN HUMAN 5 % IV SOLN
12.5000 g | Freq: Once | INTRAVENOUS | Status: AC
  Administered 2024-01-20: 12.5 g via INTRAVENOUS

## 2024-01-20 MED ORDER — SODIUM PHOSPHATES 45 MMOLE/15ML IV SOLN
30.0000 mmol | Freq: Once | INTRAVENOUS | Status: AC
  Administered 2024-01-20: 30 mmol via INTRAVENOUS
  Filled 2024-01-20: qty 10

## 2024-01-20 MED ORDER — SODIUM CHLORIDE 0.9 % IV SOLN
2.0000 g | INTRAVENOUS | Status: DC
  Administered 2024-01-21 – 2024-01-26 (×6): 2 g via INTRAVENOUS
  Filled 2024-01-20 (×6): qty 20

## 2024-01-20 MED ORDER — ADULT MULTIVITAMIN W/MINERALS CH
1.0000 | ORAL_TABLET | Freq: Every day | ORAL | Status: DC
  Administered 2024-01-22 – 2024-01-29 (×8): 1 via ORAL
  Filled 2024-01-20 (×8): qty 1

## 2024-01-20 MED ORDER — PHENYLEPHRINE 80 MCG/ML (10ML) SYRINGE FOR IV PUSH (FOR BLOOD PRESSURE SUPPORT)
PREFILLED_SYRINGE | INTRAVENOUS | Status: AC
Start: 2024-01-20 — End: 2024-01-20
  Filled 2024-01-20: qty 10

## 2024-01-20 MED ORDER — ORAL CARE MOUTH RINSE: 15.0000 mL | OROMUCOSAL | Status: DC | PRN

## 2024-01-20 MED ORDER — LIDOCAINE 2% (20 MG/ML) 5 ML SYRINGE
INTRAMUSCULAR | Status: AC
  Filled 2024-01-20: qty 5

## 2024-01-20 MED ORDER — PERFLUTREN LIPID MICROSPHERE
1.0000 mL | INTRAVENOUS | Status: AC | PRN
  Administered 2024-01-20: 3 mL via INTRAVENOUS

## 2024-01-20 NOTE — Transfer of Care (Signed)
 Immediate Anesthesia Transfer of Care Note  Patient: Jerry [name]  Procedure(s) Performed: CYSTOSCOPY, WITH RETROGRADE PYELOGRAM AND URETERAL STENT INSERTION (Bilateral: Ureter)  Patient Location: PACU  Anesthesia Type:General and Regional  Level of Consciousness: awake, drowsy, and confused  Airway & Oxygen Therapy: Patient Spontanous Breathing and Patient connected to face mask oxygen  Post-op Assessment: Report given to RN and Post -op Vital signs reviewed and stable  Post vital signs: Reviewed and stable  Last Vitals:  Vitals Value Taken Time  BP 90/45 01/20/24 00:22  Temp 37.9 C 01/20/24 00:22  Pulse 96 01/20/24 00:28  Resp 23 01/20/24 00:28  SpO2 95 % 01/20/24 00:28  Vitals shown include unfiled device data.  Last Pain:  Vitals:   01/19/24 2151  TempSrc:   PainSc: 9          Complications: No notable events documented.

## 2024-01-20 NOTE — Progress Notes (Signed)
 PHARMACY - PHYSICIAN COMMUNICATION CRITICAL VALUE ALERT - BLOOD CULTURE IDENTIFICATION (BCID)  Jerry [name] is an 89 y.o. male who presented to [facility] on 01/19/2024 with a chief complaint of pyelo  Name of physician (or Provider) Contacted: Tinnie Furth, PA-C  Current antibiotics: Ceftriaxone 1g IV q24h  Changes to prescribed antibiotics recommended:  Will increase Ceftriaxone to 2g IV q24h  Results for orders placed or performed during the hospital encounter of 01/19/24  Blood Culture ID Panel (Reflexed) (Collected: 01/19/2024  7:44 PM)  Result Value Ref Range   Enterococcus faecalis NOT DETECTED NOT DETECTED   Enterococcus Faecium NOT DETECTED NOT DETECTED   Listeria monocytogenes NOT DETECTED NOT DETECTED   Staphylococcus species NOT DETECTED NOT DETECTED   Staphylococcus aureus (BCID) NOT DETECTED NOT DETECTED   Staphylococcus epidermidis NOT DETECTED NOT DETECTED   Staphylococcus lugdunensis NOT DETECTED NOT DETECTED   Streptococcus species NOT DETECTED NOT DETECTED   Streptococcus agalactiae NOT DETECTED NOT DETECTED   Streptococcus pneumoniae NOT DETECTED NOT DETECTED   Streptococcus pyogenes NOT DETECTED NOT DETECTED   A.calcoaceticus-baumannii NOT DETECTED NOT DETECTED   Bacteroides fragilis NOT DETECTED NOT DETECTED   Enterobacterales DETECTED (A) NOT DETECTED   Enterobacter cloacae complex NOT DETECTED NOT DETECTED   Escherichia coli DETECTED (A) NOT DETECTED   Klebsiella aerogenes NOT DETECTED NOT DETECTED   Klebsiella oxytoca NOT DETECTED NOT DETECTED   Klebsiella pneumoniae NOT DETECTED NOT DETECTED   Proteus species NOT DETECTED NOT DETECTED   Salmonella species NOT DETECTED NOT DETECTED   Serratia marcescens NOT DETECTED NOT DETECTED   Haemophilus influenzae NOT DETECTED NOT DETECTED   Neisseria meningitidis NOT DETECTED NOT DETECTED   Pseudomonas aeruginosa NOT DETECTED NOT DETECTED   Stenotrophomonas maltophilia NOT DETECTED NOT DETECTED   Candida  albicans NOT DETECTED NOT DETECTED   Candida auris NOT DETECTED NOT DETECTED   Candida glabrata NOT DETECTED NOT DETECTED   Candida krusei NOT DETECTED NOT DETECTED   Candida parapsilosis NOT DETECTED NOT DETECTED   Candida tropicalis NOT DETECTED NOT DETECTED   Cryptococcus neoformans/gattii NOT DETECTED NOT DETECTED   CTX-M ESBL NOT DETECTED NOT DETECTED   Carbapenem resistance IMP NOT DETECTED NOT DETECTED   Carbapenem resistance KPC NOT DETECTED NOT DETECTED   Carbapenem resistance NDM NOT DETECTED NOT DETECTED   Carbapenem resist OXA 48 LIKE NOT DETECTED NOT DETECTED   Carbapenem resistance VIM NOT DETECTED NOT DETECTED    Clair Agent 01/20/2024  6:25 AM

## 2024-01-20 NOTE — Progress Notes (Signed)
     Coox 56-57 BNP > 1000 EF 35%  DIC panel positive -platelets worse, INR now 2 (Schistocyte negative)   Plan  - strt low dose dobutamine  - dc aspiriin -dc plavix  - dc heparin  SQ  - check troponin - check EKG  - called son but LMTCB    SIGNATURE    Dr. Dorethia Cave, M.D., F.C.C.P,  Pulmonary and Critical Care Medicine Staff Physician, [facility] Center Director - Interstitial Lung Disease  Program  Pulmonary Fibrosis [facility] at [facility] [place], [place], [address]   Pager: [phone], If no answer  -> Check AMION or Try [phone] Telephone (clinical office): [phone] Telephone (research): [phone]  2:12 PM 01/20/2024   Recent Labs  Lab 01/19/24 1944 01/20/24 1130  INR 1.3* 2.0*

## 2024-01-20 NOTE — Progress Notes (Signed)
 Transition of Care [facility]) - CAGE-AID Screening   Patient Details  Name: HERB BELTRE MRN: [id number] Date of Birth: 02-21-1929  [facility]) CM/SW Contact:    Sallyanne MALVA Mettle, RN Phone Number: 01/20/2024, 10:26 PM    CAGE-AID Screening: Substance Abuse Screening unable to be completed due to: : Patient unable to participate (confused)

## 2024-01-20 NOTE — Progress Notes (Signed)
 Date and time results received: 01/20/24 0315  Test: Lactic Acid Critical Value: 7.8  Name of Provider Notified: Dr. Kassie, MD [facility]  Orders Received? Or Actions Taken?: 500 mL LR bolus  Jerry [name]

## 2024-01-20 NOTE — H&P (Addendum)
 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 EMS was called after family heard him fall at home. With EMS, delirious, temp 103 axillary. In ED, initially hypotensive with SBP 80s , tacyhcardic. Labs notable for WBC 14.9, plt 178, CO2 16, sCr 1.75, AST 54, Alk phos 164, AG 16, INR 1.3, UA + UTI, culture and blood cultures sent. BNP 292, pH 7.51, pCO2 19.5. initial I-stat lactic 5.1. was given 2L IVF and repeat lactic increased to 7. CXR with no acute findings. CT head and C-spine negative. CT CAP demonstrating 2mm calculus in mid left ureter with mild obstructive uropathy. Was started on vancomycin, zosyn. Also had mild COPD exacerbation noted, was given solu-medrol , nebs.  Urology was consulted by EDP and he was taken to OR for cystoscopy and possible stent placement. Admitted to CCM post operatively.   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 2.  Cystoscopy with insertion of bilateral ureteral stents. Patient arrives back from the OR on levophed 15mcg, neo 120mcg on NRB. Hard of hearing but states his name.   Interim History / Subjective:   12/2897/25 - s/p CVL. On [place] o2 On levophed gtt, On  Vasopressin gtt On fluids. On abx. BCID with ENTEROBACTER. Pharmacy increased his ceftriaxone. cREat still at 1.99mg . AWake and confused per RN. Wife Rock and son at bedside. HE wants his hearing aid. He c/o back pain    Objective   Blood pressure 100/60, pulse 74, temperature (!) 97.3 F (36.3 C), temperature source Oral, resp. rate (!) 26, height 5' 5 (1.651 m), weight 72.6 kg, SpO2 97%.        Intake/Output Summary (Last 24 hours) at 01/20/2024 0753 Last data filed at 01/20/2024 0700 Gross per 24 hour  Intake 2910.52 ml  Output 100 ml  Net 2810.52 ml   Filed Weights   01/19/24 2031 01/20/24 0158  Weight: 64 kg 72.6 kg    Examination:   General Appearance:  Looks chronic well Head:  Normocephalic, without obvious abnormality, atraumatic Eyes:  PERRL - yes, conjunctiva/corneas - muddy     Ears:  Normal external ear canals, both ears Nose:  G tube - no bu thas [place] Throat:  ETT TUBE - no , OG tube - no Neck:  Supple,  No enlargement/tenderness/nodules Lungs: Clear to auscultation bilaterally, Heart:  S1 and S2 normal, no murmur, CVP - no.  Pressors - YES Abdomen:  Soft, no masses, no organomegaly Genitalia / Rectal:  Not done Extremities:  Extremities- intact Skin:  ntact in exposed areas . Sacral area - not examined Neurologic:  Sedation - none -> RASS - +1 to - . Moves all 4s - yes. CAM-ICU - not tested but answered questions . Orientation - answered qestionss  Resolved Hospital Problem list    Assessment & Plan:  Septic shock 2/2 urosepsis and obstructed left ureteral stone  s/p STENT Acute pyelonephritis - BCID with ENTEROBACTER Unclear duration of symptoms. UA +. CT with left obstructed stone. Taken to OR by Dr. Watt. Has history of pansensitive e coli. Returned from OR on 2 pressors. Query SIRS response after cystogram   01/20/24 - On levophed 18mcg and Vasopressin via CVL.  POOR LACCTTATE CLEARNACE  PLaN  - check CVP and coox   - 1L LR Bolus and then  recheck lactate - MAP goal > 65  - STart empiric Hc after sending off cortisol -  Anti-infectives (From admission, onward)    Start     Dose/Rate Route Frequency Ordered Stop   01/20/24 2200  cefTRIAXone (ROCEPHIN) 2 g in sodium chloride  0.9 % 100 mL IVPB        2 g 200 mL/hr over 30 Minutes Intravenous Every 24 hours 01/20/24 0626     01/20/24 0630  cefTRIAXone (ROCEPHIN) 1 g in sodium chloride  0.9 % 100 mL IVPB        1 g 200 mL/hr over 30 Minutes Intravenous  Once 01/20/24 0626 01/20/24 0711   01/20/24 0230  cefTRIAXone (ROCEPHIN) 1 g in sodium chloride  0.9 % 100 mL IVPB  Status:  Discontinued        1 g 200 mL/hr over 30 Minutes Intravenous Daily at bedtime 01/19/24 2354 01/20/24 0626   01/19/24 1945  vancomycin (VANCOCIN) IVPB 1000 mg/200 mL premix        1,000 mg 200 mL/hr over 60 Minutes Intravenous  Once 01/19/24 1940 01/19/24 2209   01/19/24 1945  piperacillin-tazobactam (ZOSYN) IVPB 3.375 g        3.375 g 100 mL/hr over 30 Minutes Intravenous  Once 01/19/24 1940 01/19/24 2058       HARD OF HEARING BASELINE Acute metabolic encephalopathy  - Likely septic encephalopathy, hypoperfusion. CT head negative.   11/29/2 5 RN reports some confusion but family thinks he is improved  Plan  - get his hearing aid back  - monitor - delirium precautions  - clinically monitor  - treat underlying condition   Lactic acidosis  - severe at admit  11/29 - POOR CLEARANCE, cVP 15  Plan 1 more Liter LR and then recheck CVP Check coox, trop and bnp Await ECHO  Creat 0.8mg % a baseline oct 2025 AKI 2/2 UTI and obstructed ureteral stone  -  Cystoscopy with insertion of bilateral ureteral stents.01/19/24 AGMA   11/29 - No improvement in creat  plan - stone management per urology  - rocephin until culture data returns  - trend bmp, mag, phos - replete elytes - strict I&O - Avoid nephrotoxic agents, renally dose medications - ensure adequate renal perfusion   LOw MAG Low  Phos  Plan  - replete  ANemia of critical illness   - no bleeding  Plan  - - PRBC for hgb </= 6.9gm%    - exceptions are   -  if ACS susepcted/confirmed then transfuse for hgb </= 8.0gm%,  or    -  active bleeding with hemodynamic instability, then transfuse regardless of hemoglobin value   At at all times try to transfuse 1 unit prbc as possible with exception of active hemorrhage  Thrombocytopenia - new 01/20/24  Plan  - check DIC Panel - continue heparin  5K TID SQ but stop if < 100 -contiue DAPT for indications below   AECOPD?  History of  tobacco use  Report from EDP regarding possible COPD exacerbation. Received solu-medrol . Not on any home inhalers. Does not follow pulm. Do not see any PFTs   PLAN - clinically monitor   Hypertension  Hyperlipidemia  - Home meds: atorvastatin  40mg  daily, cardizem  240mg  daily  Plan - atorvastatin  - with LFT monitirng - no antihypertensives with shock   Recent popliteal aneurysm s/p stent on DAPT   PLAN - con't DAPT with ASA, plavix    Recent fall with small right frontal subdural  - Repeat CT head with no acute findings  PLAN - neuro checks  - already restarted DAPT   AAA  Stable on CT CAP on admit   CODE  - dNR/DNI but full medical care  FAMIOY  - 01/20/24 - son, wife, patient updated at beside    ATTESTATION & SIGNATURE   The patient DETRIC SCALISI is critically ill with multiple organ systems failure and requires high complexity decision making for assessment and support, frequent evaluation and titration of therapies, application of advanced monitoring technologies and extensive interpretation of multiple databases and discussion with other appropriate health care personnel such as bedside nurses, social workers, case production designer, theatre/television/film, consultants, respiratory therapists, nutritionists, secretaries etc.,  Critical care time includes but is not restricted to just documentation time. Documentation can happen in parallel or  sequential to care time depending on case mix urgency and priorities for the shift. So, overall critical Care Time devoted to patient care services described in this note is  40  Minutes.   This time reflects time of care of this signee Dr Dorethia Cave which includ does not reflect procedure time, or teaching time or supervisory time of PA/NP/Med student/Med Resident etc but could involve care discussion time     Dr. Dorethia Cave, M.D., [facility].C.P Pulmonary and Critical Care Medicine Staff Physician, [facility]  Pulmonary and Critical Care Pager: [phone], If no answer or between  15:00h - 7:00h: call [phone]  01/20/2024 7:53 AM    LABS    PULMONARY Recent Labs  Lab 01/19/24 1948  HCO3 15.8*  TCO2 16*  16*  O2SAT 74    CBC Recent Labs  Lab 01/19/24 1944 01/19/24 1948 01/20/24 0219  HGB 12.5* 12.9*  12.2* 11.8*  HCT 39.6 38.0*  36.0* 37.5*  WBC 14.9*  --  20.5*  PLT 178  --  103*    COAGULATION Recent Labs  Lab 01/19/24 1944  INR 1.3*    CARDIAC  No results for input(s): TROPONINI in the last 168 hours. No results for input(s): PROBNP in the last 168 hours.   CHEMISTRY Recent Labs  Lab 01/19/24 1944 01/19/24 1948 01/20/24 0219  NA 138 137  135 136  K 3.9 3.8  3.8 3.7  CL 106 107 106  CO2 16*  --  13*  GLUCOSE 120* 115* 111*  BUN 21 23 24*  CREATININE 1.75* 1.60* 1.99*  CALCIUM  8.7*  --  8.3*  MG 1.5*  --   --   PHOS 1.5*  --   --    Estimated Creatinine Clearance: 20.2 mL/min (A) (by C-G formula based on SCr of 1.99 mg/dL (H)).   LIVER Recent Labs  Lab 01/19/24 1944  AST 54*  ALT 38  ALKPHOS 164*  BILITOT 1.0  PROT 5.0*  ALBUMIN 2.7*  INR 1.3*     INFECTIOUS Recent Labs  Lab 01/19/24 2200 01/20/24 0219 01/20/24 0658  LATICACIDVEN 7.0* 7.8* 7.0*  ENDOCRINE CBG (last 3)  Recent Labs    01/20/24 0159  GLUCAP 93         IMAGING x48h  - image(s) personally visualized  -    highlighted in bold DG CHEST PORT 1 VIEW Result Date: 01/20/2024 EXAM: 1 VIEW(S) XRAY OF THE CHEST 01/20/2024 02:56:50 AM COMPARISON: Portable chest yesterday at 7:44 pm, chest CT yesterday at 8:13 pm. CLINICAL HISTORY: [id number] Encounter for central line placement [id number] Encounter for central line placement [id number] FINDINGS: LINES, TUBES AND DEVICES: There is a new right IJ (internal jugular) central line with the tip at or slightly below the superior cavoatrial junction. LUNGS AND PLEURA: There are postsurgical changes and scarring in the right lung base, increased coarse linear atelectatic markings in the left lower lobe distribution. No pleural effusion. No pneumothorax. HEART AND MEDIASTINUM: Stable cardiomegaly. No vascular congestion is seen. There is tortuosity and atherosclerosis of the aorta with stable mediastinum. BONES AND SOFT TISSUES: No acute osseous abnormality. IMPRESSION: 1. Right IJ central line with the tip at or slightly below the superior cavoatrial junction. No pneumothorax. 2. Lungs are clear of infiltrates. 3. Stable cardiomegaly without vascular congestion. Electronically signed by: Francis Quam MD 01/20/2024 03:36 AM EST RP Workstation: [id number]   DG C-Arm 1-60 Min-No Report Result Date: 01/20/2024 Fluoroscopy was utilized by the requesting physician.  No radiographic interpretation.   CT Cervical Spine Wo Contrast Result Date: 01/19/2024 EXAM: CT CERVICAL SPINE WITHOUT CONTRAST 01/19/2024 09:42:30 PM TECHNIQUE: CT of the cervical spine was performed without the administration of intravenous contrast. Multiplanar reformatted images are provided for review. Automated exposure control, iterative reconstruction, and/or weight based adjustment of the mA/kV was utilized to reduce the radiation dose to as low as reasonably achievable. COMPARISON: Comparison with CT 11/30/2023. CLINICAL HISTORY: level 2 on thinners FINDINGS: CERVICAL SPINE: BONES AND ALIGNMENT: Chronic anterolisthesis  of C3. No acute fracture or traumatic malalignment. DEGENERATIVE CHANGES: Multilevel spondylosis, disc space height loss, and degenerative endplate changes which is greatest at C4-C5. Small multilevel facet arthropathy. No severe spinal canal narrowing. SOFT TISSUES: No prevertebral soft tissue swelling. IMPRESSION: 1. No acute abnormality of the cervical spine. Electronically signed by: Norman Gatlin MD 01/19/2024 10:10 PM EST RP Workstation: [id number]   CT Head Wo Contrast Result Date: 01/19/2024 EXAM: CT HEAD WITHOUT CONTRAST 01/19/2024 09:42:30 PM TECHNIQUE: CT of the head was performed without the administration of intravenous contrast. Automated exposure control, iterative reconstruction, and/or weight based adjustment of the mA/kV was utilized to reduce the radiation dose to as low as reasonably achievable. COMPARISON: CT 11/30/2023 CLINICAL HISTORY: fall, delirium FINDINGS: BRAIN AND VENTRICLES: No acute hemorrhage. No evidence of acute infarct. No hydrocephalus. No extra-axial collection. No mass effect or midline shift. Chronic microvascular ischemia and generalized atrophy. ORBITS: No acute abnormality. SINUSES: No acute abnormality. SOFT TISSUES AND SKULL: No acute soft tissue abnormality. No skull fracture. IMPRESSION: 1. No acute intracranial abnormality. Electronically signed by: Norman Gatlin MD 01/19/2024 10:06 PM EST RP Workstation: [id number]   CT CHEST ABDOMEN PELVIS WO CONTRAST Result Date: 01/19/2024 CLINICAL DATA:  Fall EXAM: CT CHEST, ABDOMEN AND PELVIS WITHOUT CONTRAST TECHNIQUE: Multidetector CT imaging of the chest, abdomen and pelvis was performed following the standard protocol without IV contrast. RADIATION DOSE REDUCTION: This exam was performed according to the departmental dose-optimization program which includes automated exposure control, adjustment of the mA and/or kV according to patient size and/or use of iterative reconstruction technique. COMPARISON:  CT renal stone  12/13/2023. CT chest abdomen and pelvis  11/30/2023. FINDINGS: CT CHEST FINDINGS Cardiovascular: No significant vascular findings. Normal heart size. No pericardial effusion. There are atherosclerotic calcifications of the aorta and coronary arteries. Mediastinum/Nodes: No enlarged mediastinal, hilar, or axillary lymph nodes. Thyroid gland, trachea, and esophagus demonstrate no significant findings. There is a small hiatal hernia. Lungs/Pleura: There are atelectatic changes in the bilateral lower lobes. There some calcified pleural plaques in the inferior right lower hemithorax. There is scarring in both lung apices. There is no pleural effusion or pneumothorax identified. 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 Bones are diffusely osteopenic. There are healing/healed posterior right fourth through tenth rib fractures. There also multiple healing/healed anterior right rib fractures. There is questionable acute nondisplaced anterior right second rib fracture there are healing posterior left a, ninth and eleventh rib fractures. There is age indeterminate posterior left tenth rib fracture. These all appear new from 11/30/2023. CT ABDOMEN PELVIS FINDINGS Hepatobiliary: Gallstones are present. No focal liver lesion. No biliary ductal dilatation. Pancreas: There is diffuse fatty infiltration of the pancreas. Spleen: Normal in size without focal abnormality. Adrenals/Urinary Tract: There is a 2 mm calculus in the mid left ureter with mild left-sided hydronephrosis and left perinephric stranding and fluid. The adrenal glands, right kidney and bladder are within normal limits. Stomach/Bowel: Stomach is within normal limits. Appendix appears normal. No evidence of bowel wall thickening, distention, or inflammatory changes. There is sigmoid colon diverticulosis. There is a small hiatal hernia. Vascular/Lymphatic: There severe atherosclerotic calcifications of the aorta.  Abdominal aortic aneurysm measures 4.0 cm, unchanged. 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 No enlarged lymph nodes are identified. Reproductive: Prostate gland is enlarged. Other: No abdominal wall hernia or abnormality. No abdominopelvic ascites. Musculoskeletal: The bones are diffusely osteopenic. No acute fractures are identified. 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 IMPRESSION: 1. 2 mm calculus in the mid left ureter with mild obstructive uropathy. 2. Multiple healing/healed bilateral rib fractures. There is questionable acute nondisplaced anterior right second rib fracture. 3. Age-indeterminate posterior left tenth rib fracture. 4. Cholelithiasis. 5. Stable abdominal aortic aneurysm measuring 4.0 cm. 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 6. Aortic atherosclerosis. Aortic Atherosclerosis (ICD10-I70.0). Electronically Signed   By: Greig Pique M.D.   On: 01/19/2024 20:31   DG Chest Port 1 View Result Date: 01/19/2024 EXAM: 1 VIEW(S) XRAY OF THE CHEST 01/19/2024 07:47:00 PM COMPARISON: 12/03/2023 CLINICAL HISTORY: Questionable sepsis - evaluate for abnormality FINDINGS: LUNGS AND PLEURA: Surgical suture at right lung base. Chronic interstitial coarsening. Chronic scarring at right lung base. No pleural effusion. No pneumothorax. HEART AND MEDIASTINUM: Aortic atherosclerosis is present. The cardiac silhouette is unremarkable. BONES AND SOFT TISSUES: No acute osseous abnormality. IMPRESSION: 1. No acute cardiopulmonary process identified. 2. 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 Electronically signed by: Norman Gatlin MD 01/19/2024 07:58 PM EST RP Workstation: [id number]

## 2024-01-20 NOTE — Progress Notes (Addendum)
 eLink Physician-Brief Progress Note Patient Name: Jerry [name] DOB: [birth year] MRN: [id number]   Date of Service  01/20/2024  HPI/Events of Note  57M with recent SDH, HTN, HLD, PVD, s/p AAA repair admitted for sepsis due to acute pyelonephritis 2/2 bilateral obstructive stone. S/p bilateral ureter stents 11/29  eICU Interventions  Wean levophed and neo for MAP goal >65 May need central line Continue rocephin Ground team at bedside   3:38 AM: LA 7.8. S/p 2L and maintenance fluid ordered. Central line placed on levophed. Add vasopressin and additional 500 cc bolus of LR now  Intervention Category Evaluation Type: New Patient Evaluation  Zayd Bonet Slater Staff 01/20/2024, 1:59 AM

## 2024-01-20 NOTE — Procedures (Signed)
 Central Venous Catheter Insertion Procedure Note  PEYSON POSTEMA  [id number]  1928-12-21  Date:01/20/24  Time:2:51 AM   Provider Performing:Nadja Lina CHRISTELLA Dub   Procedure: Insertion of Non-tunneled Central Venous Catheter(36556) with US  guidance (23062)   Indication(s) Medication administration  Consent Risks of the procedure as well as the alternatives and risks of each were explained to the patient and/or caregiver.  Consent for the procedure was obtained and is signed in the bedside chart  Anesthesia Topical only with 1% lidocaine    Timeout Verified patient identification, verified procedure, site/side was marked, verified correct patient position, special equipment/implants available, medications/allergies/relevant history reviewed, required imaging and test results available.  Sterile Technique Maximal sterile technique including full sterile barrier drape, hand hygiene, sterile gown, sterile gloves, mask, hair covering, sterile ultrasound probe cover (if used).  Procedure Description Area of catheter insertion was cleaned with chlorhexidine  and draped in sterile fashion.  With real-time ultrasound guidance a central venous catheter was placed into the right internal jugular vein. Nonpulsatile blood flow and easy flushing noted in all ports.  The catheter was sutured in place and sterile dressing applied.  Complications/Tolerance None; patient tolerated the procedure well. Chest X-ray is ordered to verify placement for internal jugular or subclavian cannulation.   Chest x-ray is not ordered for femoral cannulation.  EBL Minimal  Specimen(s) None

## 2024-01-20 NOTE — Progress Notes (Signed)
  Echocardiogram 2D Echocardiogram has been performed.  Koleen KANDICE Popper, RDCS 01/20/2024, 10:39 AM

## 2024-01-20 NOTE — Op Note (Signed)
 Procedure: 1.  Cystoscopy with bilateral retrograde pyelography and interpretation. 2.  Cystoscopy with insertion of bilateral ureteral stents. 3.  Application of fluoroscopy.  Pre-op diagnosis: Bilateral ureteral stones with left obstruction and sepsis.  Postoperative diagnosis: Same.  Surgeon: Dr. Norleen Seltzer.  Anesthesia: General.  Specimen: None.  Drains: Bilateral 6 French by 24 cm contour double-J stents and 16 French Foley catheter.  EBL: None.  Complications: None.  Indications: The patient is a 89 year old male who presented the emergency room today with delirium and was found on evaluation to have sepsis with urinary tract infection and a 2 mm obstructing stone on the left in the mid ureter.  On my review of the CT and with comparison to a CT from October, he also had a 2 mm right mid ureteral stone without obstruction.  It was felt that cystoscopy with bilateral retrograde pyelograms, left stent and possible right stent was indicated.  Procedure: He was given Zosyn and vancomycin in the emergency room.  He was taken the operating room where general anesthetic was induced.  He was fitted with PAS hose and placed in lithotomy position.  Genitalia was prepped with Betadine solution and draped in usual sterile fashion.  Cystoscopy was performed using the 21 French scope and 30 degree lens.  Examination over normal urethra.  The external sphincter was intact.  The prostatic urethra was short with trilobar hyperplasia with a small middle lobe.  Examination of bladder revealed moderate trabeculation with some cellules and some patchy erythema consistent with infection.  Ureteral orifices were unremarkable.  The left ureteral orifice was cannulated with a 5 French open-ended catheter and Omnipaque was instilled.  The left retrograde pyelogram demonstrated significant J hooking of the left distal ureter with intermittent dilation of the ureter.  A clear filling defect was not identified  to stone had been small on CT.  There was not marked dilation of the collecting system.  After completion of the retrograde pyelogram, sensor wire was advanced the kidney under fluoroscopic guidance and the open-ended catheter was removed.  A 6 French by 24 cm contour double-J stent without tether was advanced over the wire to the kidney under fluoroscopic guidance.  The wire was removed, leaving good coil in the kidney and a good coil in the bladder.  A right retrograde pyelogram was then performed using the 5 French open-ended catheter and Omnipaque.  The right retrograde pyelogram demonstrated J hooking of the right distal ureter and there was no obvious filling defect but there was an acute narrowing of the ureter in the mid ureter without significant proximal dilation but there was a suggestion of a filling defect at the area of narrowing.  A sensor wire was then advanced the kidney under fluoroscopic guidance and the open-ended catheter was removed.  A second 6 French by 24 cm contour double-J stent was advanced the kidney under fluoroscopic guidance and the wire was removed leaving good coil in the kidney and a good coil in the bladder.  The cystoscope was removed and a 16 French Foley catheter was inserted.  The balloon was filled with 10 mL of sterile fluid.  The catheter was placed to straight drainage.  Patient was taken down from lithotomy position, his anesthetic was reversed and he was moved recovery in stable condition.  There were no complications.

## 2024-01-20 NOTE — Anesthesia Postprocedure Evaluation (Signed)
 Anesthesia Post Note  Patient: Jerry [name]  Procedure(s) Performed: CYSTOSCOPY, WITH RETROGRADE PYELOGRAM AND URETERAL STENT INSERTION (Bilateral: Ureter)     Patient location during evaluation: PACU Anesthesia Type: General Level of consciousness: awake and confused Pain management: pain level controlled Vital Signs Assessment: post-procedure vital signs reviewed and stable Respiratory status: spontaneous breathing, nonlabored ventilation, respiratory function stable and patient connected to face mask oxygen Cardiovascular status: unstable Postop Assessment: no apparent nausea or vomiting Anesthetic complications: no   No notable events documented.  Last Vitals:  Vitals:   01/20/24 0158 01/20/24 0200  BP:  92/61  Pulse:  90  Resp: (!) 25 (!) 25  Temp:  (!) 36.3 C  SpO2:  100%    Last Pain:  Vitals:   01/20/24 0200  TempSrc: Axillary  PainSc:                  Debby FORBES Like

## 2024-01-21 LAB — CBC
HCT: 32.4 % — ABNORMAL LOW (ref 39.0–52.0)
HCT: 34.4 % — ABNORMAL LOW (ref 39.0–52.0)
Hemoglobin: 10.8 g/dL — ABNORMAL LOW (ref 13.0–17.0)
Hemoglobin: 11.3 g/dL — ABNORMAL LOW (ref 13.0–17.0)
MCH: 31.7 pg (ref 26.0–34.0)
MCH: 31.6 pg (ref 26.0–34.0)
MCHC: 33.3 g/dL (ref 30.0–36.0)
MCHC: 32.8 g/dL (ref 30.0–36.0)
MCV: 95 fL (ref 80.0–100.0)
MCV: 96.1 fL (ref 80.0–100.0)
Platelets: 69 K/uL — ABNORMAL LOW (ref 150–400)
Platelets: 47 K/uL — ABNORMAL LOW (ref 150–400)
RBC: 3.41 MIL/uL — ABNORMAL LOW (ref 4.22–5.81)
RBC: 3.58 MIL/uL — ABNORMAL LOW (ref 4.22–5.81)
RDW: 14.7 % (ref 11.5–15.5)
RDW: 14.9 % (ref 11.5–15.5)
WBC: 39 K/uL — ABNORMAL HIGH (ref 4.0–10.5)
WBC: 43.6 K/uL — ABNORMAL HIGH (ref 4.0–10.5)
nRBC: 0 % (ref 0.0–0.2)
nRBC: 0 % (ref 0.0–0.2)

## 2024-01-21 LAB — COMPREHENSIVE METABOLIC PANEL WITH GFR
ALT: 145 U/L — ABNORMAL HIGH (ref 0–44)
AST: 140 U/L — ABNORMAL HIGH (ref 15–41)
Albumin: 2.4 g/dL — ABNORMAL LOW (ref 3.5–5.0)
Alkaline Phosphatase: 125 U/L (ref 38–126)
Anion gap: 12 (ref 5–15)
BUN: 38 mg/dL — ABNORMAL HIGH (ref 8–23)
CO2: 18 mmol/L — ABNORMAL LOW (ref 22–32)
Calcium: 7.3 mg/dL — ABNORMAL LOW (ref 8.9–10.3)
Chloride: 103 mmol/L (ref 98–111)
Creatinine, Ser: 2.16 mg/dL — ABNORMAL HIGH (ref 0.61–1.24)
GFR, Estimated: 28 mL/min — ABNORMAL LOW (ref >60)
Glucose, Bld: 130 mg/dL — ABNORMAL HIGH (ref 70–99)
Potassium: 4.2 mmol/L (ref 3.5–5.1)
Sodium: 133 mmol/L — ABNORMAL LOW (ref 135–145)
Total Bilirubin: 1 mg/dL (ref 0.0–1.2)
Total Protein: 4.6 g/dL — ABNORMAL LOW (ref 6.5–8.1)

## 2024-01-21 LAB — PROTIME-INR
INR: 1.8 — ABNORMAL HIGH (ref 0.8–1.2)
INR: 1.4 — ABNORMAL HIGH (ref 0.8–1.2)
Prothrombin Time: 22.2 s — ABNORMAL HIGH (ref 11.4–15.2)
Prothrombin Time: 17.5 s — ABNORMAL HIGH (ref 11.4–15.2)

## 2024-01-21 LAB — BASIC METABOLIC PANEL WITH GFR
Anion gap: 14 (ref 5–15)
BUN: 43 mg/dL — ABNORMAL HIGH (ref 8–23)
CO2: 16 mmol/L — ABNORMAL LOW (ref 22–32)
Calcium: 7.4 mg/dL — ABNORMAL LOW (ref 8.9–10.3)
Chloride: 103 mmol/L (ref 98–111)
Creatinine, Ser: 1.91 mg/dL — ABNORMAL HIGH (ref 0.61–1.24)
GFR, Estimated: 32 mL/min — ABNORMAL LOW (ref >60)
Glucose, Bld: 139 mg/dL — ABNORMAL HIGH (ref 70–99)
Potassium: 3.9 mmol/L (ref 3.5–5.1)
Sodium: 133 mmol/L — ABNORMAL LOW (ref 135–145)

## 2024-01-21 LAB — COOXEMETRY PANEL
Carboxyhemoglobin: 1.1 % (ref 0.5–1.5)
Carboxyhemoglobin: 2.1 % — ABNORMAL HIGH (ref 0.5–1.5)
Methemoglobin: <0.7 % (ref 0.0–1.5)
Methemoglobin: <0.7 % (ref 0.0–1.5)
O2 Saturation: 62.9 %
O2 Saturation: 92.3 %
Total hemoglobin: 12 g/dL (ref 12.0–16.0)
Total hemoglobin: 11.5 g/dL — ABNORMAL LOW (ref 12.0–16.0)

## 2024-01-21 LAB — URINE CULTURE: Culture: >=100000 — AB

## 2024-01-21 LAB — TROPONIN I (HIGH SENSITIVITY): Troponin I (High Sensitivity): 125 ng/L (ref <18)

## 2024-01-21 LAB — MAGNESIUM: Magnesium: 1.8 mg/dL (ref 1.7–2.4)

## 2024-01-21 LAB — PHOSPHORUS: Phosphorus: 4.4 mg/dL (ref 2.5–4.6)

## 2024-01-21 LAB — LACTIC ACID, PLASMA
Lactic Acid, Venous: 4.1 mmol/L (ref 0.5–1.9)
Lactic Acid, Venous: 2.8 mmol/L (ref 0.5–1.9)
Lactic Acid, Venous: 2.5 mmol/L (ref 0.5–1.9)

## 2024-01-21 MED ORDER — THIAMINE MONONITRATE 100 MG PO TABS
100.0000 mg | ORAL_TABLET | Freq: Every day | ORAL | Status: AC
  Administered 2024-01-22 – 2024-01-28 (×7): 100 mg via ORAL
  Filled 2024-01-21 (×7): qty 1

## 2024-01-21 MED ORDER — ASPIRIN 81 MG PO TBEC: 81.0000 mg | DELAYED_RELEASE_TABLET | Freq: Every day | ORAL | Status: DC

## 2024-01-21 MED ORDER — ENSURE PLUS HIGH PROTEIN PO LIQD
237.0000 mL | Freq: Three times a day (TID) | ORAL | Status: DC
  Administered 2024-01-22 – 2024-01-29 (×15): 237 mL via ORAL

## 2024-01-21 MED ORDER — MAGNESIUM SULFATE IN D5W 1-5 GM/100ML-% IV SOLN
1.0000 g | Freq: Once | INTRAVENOUS | Status: AC
  Administered 2024-01-21: 1 g via INTRAVENOUS
  Filled 2024-01-21: qty 100

## 2024-01-21 MED ORDER — MAGNESIUM SULFATE 2 GM/50ML IV SOLN
2.0000 g | Freq: Once | INTRAVENOUS | Status: AC
  Administered 2024-01-21: 2 g via INTRAVENOUS
  Filled 2024-01-21: qty 50

## 2024-01-21 MED ORDER — FUROSEMIDE 10 MG/ML IJ SOLN
20.0000 mg | Freq: Once | INTRAMUSCULAR | Status: AC
  Administered 2024-01-21: 20 mg via INTRAVENOUS
  Filled 2024-01-21: qty 2
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
## 2024-01-21 NOTE — Progress Notes (Addendum)
 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 EMS was called after family heard him fall at home. With EMS, delirious, temp 103 axillary. In ED, initially hypotensive with SBP 80s , tacyhcardic. Labs notable for WBC 14.9, plt 178, CO2 16, sCr 1.75, AST 54, Alk phos 164, AG 16, INR 1.3, UA + UTI, culture and blood cultures sent. BNP 292, pH 7.51, pCO2 19.5. initial I-stat lactic 5.1. was given 2L IVF and repeat lactic increased to 7. CXR with no acute findings. CT head and C-spine negative. CT CAP demonstrating 2mm calculus in mid left ureter with mild obstructive uropathy. Was started on vancomycin, zosyn. Also had mild COPD exacerbation noted, was given solu-medrol , nebs.  Urology was consulted by EDP and he was taken to OR for cystoscopy and possible stent placement. Admitted to CCM post operatively.   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 2.  Cystoscopy with insertion of bilateral ureteral stents. Patient arrives back from the OR on levophed 15mcg, neo 120mcg on NRB. Hard of hearing but states his name.  01/20/24 - s/p CVL. On Big Arm o2 On levophed gtt, On Vasopressin gtt On fluids. On abx.  BCID with ENTEROBACTER. Pharmacy increased his ceftriaxone. cREat still at 1.99mg . AWake and confused per RN. Wife Rock and son at bedside. HE wants his hearing aid. He c/o back pain  Developed DIC with poor lactate clearnace New s-cHF with low coox - and low dose doubtamine stared  Interim History / Subjective:    01/21/24 - Tolerated levophed and dobutamine well till this AM and now mild A Fib RVR. Coox improved. LAcate improved DIC parameters improving (except platelets which are worse). Creat some worse.  BNP is very HIGH > 1000. 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 rate (!) 24, height 5' 5 (1.651 m), weight 78.3 kg, SpO2 95%. CVP:  [14 mmHg-17 mmHg] 14 mmHg      Intake/Output Summary (Last 24 hours) at 01/21/2024 0855 Last data filed at 01/21/2024 0800 Gross per 24 hour  Intake 1994.81 ml  Output 480 ml  Net 1514.81 ml   Filed Weights   01/19/24 2031 01/20/24 0158 01/21/24 0337  Weight: 64 kg 72.6 kg 78.3 kg    Examination: General Appearance:  Looks better. Wife and daughter at bedside Head:  Normocephalic, without obvious abnormality, atraumatic Eyes:  PERRL - yes,  conjunctiva/corneas - muddy     Ears:  Normal external ear canals, both ears Nose:  G tube - no but has [place] Throat:  ETT TUBE - no , OG tube - no Neck:  Supple,  No enlargement/tenderness/nodules Lungs: Clear to auscultation bilaterally, Heart:  S1 and S2 normal, no murmur, CVP - no.  Pressors - doubtamine Abdomen:  Soft, no masses, no organomegaly Genitalia / Rectal:  Not done Extremities:  Extremities- intact Skin:  ntact in exposed areas . Sacral area - intact Neurologic:   Sedation - non -> RASS - +1 . Moves all 4s - yes. CAM-ICU - gives good history of past but does have random confuision . Orientation - mild confusion +     Resolved Hospital Problem list    Assessment & Plan:  Septic shock 2/2 urosepsis and obstructed left ureteral stone  s/p STENT at admit Acute pyelonephritis due to E COiLII -  left obstructed stone.   11/30/.25 - levophed down to 2mcg. OFf Vasopressin. On low dose dobumtaine. Improved lactate clearance. cVP 14-15. BNP 1000  PLaN  - lasix 20mg  IV x 1 - levophed titrate for MAP > 65 - reduce dobutamine to 1.25mcg and continue for now  - Ceftruiaxone 11/28 - (01/26/25) - Zosyn 11/28- 11/28 - Vanc11/28 - 11/28    Lactic acidosis  - severe at admit with ppor clearance  11/30 improved clarance aftter dobutamine since 11/29.25  Plan monitir  Creat 0.8mg % a baseline oct 2025 AKI 2/2 UTI and obstructed ureteral stone  -  Cystoscopy with insertion of bilateral ureteral stents.01/19/24   11/30 - Sligh worseing of creat but pace of worrsening slowing down  Plan - stone management per urology  - - strict I&O - Avoid nephrotoxic agents, renally dose medications - ensure adequate renal perfusion  - rechjeck BMET after lasix   NEw Onset S=CHF on ECHO 01/20/24  ef 35% Mild flat troponin  - 01/21/24 0 had low coox  sTarted on low dose doubtmaine 01/20/24. BNP > 1000. CVP 15  Plan - lasix 20mg  x 1 - opd cardiology   DAPT Rx since 01/30/23 for 2 x stent at [facility]/Popliteal artery DIC panel positive 01/20/24 - NO SCHISTOCYTE Thrombocytopenia - new 01/20/24   01/21/24 - improved but platelets worse  Plan  - hold asa, plavix  and sq heparin  (d/w  DR Buckely of [facility]) as needed till improvement    ANemia of critical illness   - no bleeding  Plan  - - PRBC for hgb </= 6.9gm%    - exceptions are   -  if ACS susepcted/confirmed then transfuse for hgb </= 8.0gm%,  or    -  active bleeding with hemodynamic instability, then  transfuse regardless of hemoglobin value   At at all times try to transfuse 1 unit prbc as possible with exception of active hemorrhage     HARD OF HEARING BASELINE Acute metabolic encephalopathy  - Likely septic encephalopathy, hypoperfusion. CT head negative.   11/29/2 5 RN reports some confusion but family thinks he is improved 11/30.25 much improved though intermitten confusion  Plan  - get his hearing aid back  - monitor - delirium precautions  - clinically monitor  - treat underlying condition    Low Mag < 2gm% on 01/21/24  Plan  - replete  A Fib with mild RVR 01/21/24    - cause: levophed, dobutamine and s-chf and sepsis and high bNP HR 90-100 and acceptable  Plan  - lasix x 1 -  no ac due to dic - wean dobutamine and levophed as tolerated  Dysphagia 01/21/24  Plan  - await swallow eval  AECOPD?  History of tobacco use  Report from EDP regarding possible COPD exacerbation. Received solu-medrol . Not on any home inhalers. Does not follow pulm. Do not see any PFTs   PLAN - clinically monitor   Hypertension hx Hyperlipidemia  hx - Home meds: atorvastatin  40mg  daily, cardizem  240mg  daily  Plan - atorvastatin  - with LFT monitirng - no antihypertensives with shock     Recent fall with small right frontal subdural  - Repeat CT head with no acute findings  PLAN - neuro checks  - already restarted DAPT   AAA  Stable on CT CAP on admit   CODE  - dNR/DNI but full medical care  [facility]  - 01/20/24 - son, wife, patient updated at beside - 01/21/24 - wife, patient and duaghter at bedside    ATTESTATION & SIGNATURE   The patient KIEV LABROSSE is critically ill with multiple organ systems failure and requires high complexity decision making for assessment and support, frequent evaluation and titration of therapies, application of advanced monitoring technologies and extensive interpretation of multiple databases and discussion with other appropriate  health care personnel such as bedside nurses, social workers, case production designer, theatre/television/film, consultants, respiratory therapists, nutritionists, secretaries etc.,  Critical care time includes but is not restricted to just documentation time. Documentation can happen in parallel or sequential to care time depending on case mix urgency and priorities for the shift. So, overall critical Care Time devoted to patient care services described in this note is  35  Minutes.   This time reflects time of care of this signee Dr Dorethia Cave which includ does not reflect procedure time, or teaching time or supervisory time of PA/NP/Med student/Med Resident etc but could involve care discussion time     Dr. Dorethia Cave, M.D., [facility].C.P Pulmonary and Critical Care Medicine Staff Physician, [facility] [place] [facility] Pager: [phone], If no answer or between  15:00h - 7:00h: call [phone]  01/21/2024 9:23 AM     LABS    PULMONARY Recent Labs  Lab 01/19/24 1948 01/20/24 1020 01/20/24 1653  HCO3 15.8*  --   --   TCO2 16*  16*  --   --   O2SAT 74 56.9 76    CBC Recent Labs  Lab 01/20/24 0219 01/20/24 1130 01/20/24 1958 01/21/24 0314  HGB 11.8*  --  11.4* 10.8*  HCT 37.5*  --  34.8* 32.4*  WBC 20.5*  --  40.4* 39.0*  PLT 103* 90* 81* 69*    COAGULATION Recent Labs  Lab 01/19/24 1944 01/20/24 1130 01/21/24 0314  INR 1.3* 2.0* 1.8*    CARDIAC  No results for input(s): TROPONINI in the last 168 hours. No results for input(s): PROBNP in the last 168 hours.   CHEMISTRY Recent Labs  Lab 01/19/24 1944 01/19/24 1948 01/20/24 0219 01/20/24 1958 01/21/24 0314  NA 138 137  135 136 134* 133*  K 3.9 3.8  3.8 3.7 3.9 4.2  CL 106 107 106 104 103  CO2 16*  --  13* 16* 18*  GLUCOSE 120* 115* 111* 162* 130*  BUN 21 23 24* 33* 38*  CREATININE 1.75* 1.60* 1.99* 2.07* 2.16*  CALCIUM  8.7*  --  8.3* 7.3* 7.3*  MG 1.5*  --   --   --  1.8  PHOS 1.5*   --   --   --  4.4   Estimated Creatinine Clearance: 20.6 mL/min (A) (by C-G formula based on SCr of 2.16 mg/dL (H)).   LIVER Recent Labs  Lab 01/19/24 1944 01/20/24 1130 01/20/24 1958 01/21/24 0314  AST 54*  --  155* 140*  ALT 38  --  131* 145*  ALKPHOS 164*  --  134* 125  BILITOT 1.0  --  0.7 1.0  PROT 5.0*  --  4.7* 4.6*  ALBUMIN 2.7*  --  2.5* 2.4*  INR 1.3* 2.0*  --  1.8*     INFECTIOUS Recent Labs  Lab 01/20/24 1130 01/20/24 1958 01/21/24 0314  LATICACIDVEN 6.3* 4.1* 2.8*     ENDOCRINE CBG (last 3)  Recent Labs    01/20/24 0159  GLUCAP 93         IMAGING x48h  - image(s) personally visualized  -   highlighted in bold ECHOCARDIOGRAM COMPLETE Result Date: 01/20/2024    ECHOCARDIOGRAM REPORT   Patient Name:   Jerry [name] Date of Exam: 01/20/2024 Medical Rec #:  [id number]        Height:       65.0 in Accession #:    [id number]       Weight:       160.1 lb Date of Birth:  1928/03/06        BSA:          1.799 m Patient Age:    89 years         BP:           115/59 mmHg Patient Gender: M                HR:           73 bpm. Exam Location:  Inpatient Procedure: 2D Echo, Cardiac Doppler, Color Doppler and Intracardiac            Opacification Agent (Both Spectral and Color Flow Doppler were            utilized during procedure). Indications:    Shock R57.9  History:        Patient has no prior history of Echocardiogram examinations.                 Signs/Symptoms:Shortness of Breath and Dyspnea; Risk                 Factors:Dyslipidemia, Hypertension and Pre-diabetes.  Sonographer:    Koleen Popper RDCS Referring Phys: [id number] OMAR M ALBUSTAMI IMPRESSIONS  1. No left ventricular thrombus is seen (Definity contrast was used). Left ventricular ejection fraction, by estimation, is 35 to 40%. The left ventricle has moderately decreased function. The left ventricle demonstrates global hypokinesis. Left ventricular diastolic parameters are consistent with Grade I  diastolic dysfunction (impaired relaxation).  2. Right ventricular systolic function is moderately reduced. The right ventricular size is not well visualized. Tricuspid regurgitation signal is inadequate for assessing PA pressure.  3. Left atrial size was mildly dilated.  4. The mitral valve is degenerative. No evidence of mitral valve regurgitation. No evidence of mitral stenosis. Moderate mitral annular calcification.  5. There appears to be low flow low gradient mild aortic stenosis. The aortic valve is tricuspid. There is moderate calcification of the aortic valve. There is moderate thickening of the aortic valve. Aortic valve regurgitation is trivial. Mild aortic valve stenosis.  6. The inferior vena cava is dilated in size with <50% respiratory variability, suggesting right atrial pressure of 15 mmHg. FINDINGS  Left Ventricle: No  left ventricular thrombus is seen (Definity contrast was used). Left ventricular ejection fraction, by estimation, is 35 to 40%. The left ventricle has moderately decreased function. The left ventricle demonstrates global hypokinesis.  Definity contrast agent was given IV to delineate the left ventricular endocardial borders. The left ventricular internal cavity size was normal in size. There is borderline left ventricular hypertrophy of the basal-septal segment. Left ventricular diastolic parameters are consistent with Grade I diastolic dysfunction (impaired relaxation). Normal left ventricular filling pressure. Right Ventricle: The right ventricular size is not well visualized. Right vetricular wall thickness was not well visualized. Right ventricular systolic function is moderately reduced. Tricuspid regurgitation signal is inadequate for assessing PA pressure. Left Atrium: Left atrial size was mildly dilated. Right Atrium: Right atrial size was normal in size. Pericardium: There is no evidence of pericardial effusion. Mitral Valve: The mitral valve is degenerative in appearance.  There is mild thickening of the mitral valve leaflet(s). Moderate mitral annular calcification. No evidence of mitral valve regurgitation. No evidence of mitral valve stenosis. Tricuspid Valve: The tricuspid valve is normal in structure. Tricuspid valve regurgitation is mild. Aortic Valve: There appears to be low flow low gradient mild aortic stenosis. The aortic valve is tricuspid. There is moderate calcification of the aortic valve. There is moderate thickening of the aortic valve. Aortic valve regurgitation is trivial. Mild aortic stenosis is present. Aortic valve mean gradient measures 4.5 mmHg. Aortic valve peak gradient measures 8.5 mmHg. Aortic valve area, by VTI measures 1.36 cm. Pulmonic Valve: The pulmonic valve was not well visualized. Aorta: The aortic root and ascending aorta are structurally normal, with no evidence of dilitation. Venous: The inferior vena cava is dilated in size with less than 50% respiratory variability, suggesting right atrial pressure of 15 mmHg. IAS/Shunts: No atrial level shunt detected by color flow Doppler.  LEFT VENTRICLE PLAX 2D LVIDd:         4.30 cm     Diastology LVIDs:         3.00 cm     LV e' medial:    5.66 cm/s LV PW:         1.00 cm     LV E/e' medial:  9.3 LV IVS:        1.30 cm     LV e' lateral:   4.68 cm/s LVOT diam:     1.90 cm     LV E/e' lateral: 11.3 LV SV:         37 LV SV Index:   21 LVOT Area:     2.84 cm  LV Volumes (MOD) LV vol d, MOD A4C: 74.5 ml LV vol s, MOD A4C: 45.0 ml LV SV MOD A4C:     29.5 ml RIGHT VENTRICLE            IVC RV S prime:     6.64 cm/s  IVC diam: 2.50 cm TAPSE (M-mode): 1.7 cm LEFT ATRIUM             Index        RIGHT ATRIUM           Index LA diam:        3.80 cm 2.11 cm/m   RA Area:     13.30 cm LA Vol (A2C):   36.9 ml 20.51 ml/m  RA Volume:   29.80 ml  16.56 ml/m LA Vol (A4C):   29.0 ml 16.12 ml/m LA Biplane Vol: 33.0 ml 18.34 ml/m  AORTIC VALVE AV Area (  Vmax):    1.48 cm AV Area (Vmean):   1.39 cm AV Area (VTI):      1.36 cm AV Vmax:           145.79 cm/s AV Vmean:          100.740 cm/s AV VTI:            0.273 m AV Peak Grad:      8.5 mmHg AV Mean Grad:      4.5 mmHg LVOT Vmax:         76.00 cm/s LVOT Vmean:        49.300 cm/s LVOT VTI:          0.131 m LVOT/AV VTI ratio: 0.48  AORTA Ao Root diam: 3.20 cm Ao Asc diam:  3.20 cm MITRAL VALVE MV Area (PHT): 3.12 cm    SHUNTS MV Decel Time: 243 msec    Systemic VTI:  0.13 m MV E velocity: 52.90 cm/s  Systemic Diam: 1.90 cm MV A velocity: 98.00 cm/s MV E/A ratio:  0.54 Mihai Croitoru MD Electronically signed by Jerel Balding MD Signature Date/Time: 01/20/2024/1:19:29 PM    Final    DG Chest Port 1 View Result Date: 01/20/2024 CLINICAL DATA:  Respiratory distress. EXAM: PORTABLE CHEST 1 VIEW COMPARISON:  Prior today, and chest CT on 01/19/2024 FINDINGS: The heart size is within normal limits. Right jugular central venous catheter remains in appropriate position. Surgical staples again seen in the right lung base. Bibasilar scarring shows no significant change. No evidence of superimposed pulmonary consolidation or pleural effusion. IMPRESSION: Bibasilar scarring. No acute findings. Electronically Signed   By: Norleen DELENA Kil M.D.   On: 01/20/2024 11:50   DG CHEST PORT 1 VIEW Result Date: 01/20/2024 EXAM: 1 VIEW(S) XRAY OF THE CHEST 01/20/2024 02:56:50 AM COMPARISON: Portable chest yesterday at 7:44 pm, chest CT yesterday at 8:13 pm. CLINICAL HISTORY: [id number] Encounter for central line placement [id number] Encounter for central line placement [id number] FINDINGS: LINES, TUBES AND DEVICES: There is a new right IJ (internal jugular) central line with the tip at or slightly below the superior cavoatrial junction. LUNGS AND PLEURA: There are postsurgical changes and scarring in the right lung base, increased coarse linear atelectatic markings in the left lower lobe distribution. No pleural effusion. No pneumothorax. HEART AND MEDIASTINUM: Stable cardiomegaly. No vascular congestion is  seen. There is tortuosity and atherosclerosis of the aorta with stable mediastinum. BONES AND SOFT TISSUES: No acute osseous abnormality. IMPRESSION: 1. Right IJ central line with the tip at or slightly below the superior cavoatrial junction. No pneumothorax. 2. Lungs are clear of infiltrates. 3. Stable cardiomegaly without vascular congestion. Electronically signed by: Francis Quam MD 01/20/2024 03:36 AM EST RP Workstation: [id number]   DG C-Arm 1-60 Min-No Report Result Date: 01/20/2024 Fluoroscopy was utilized by the requesting physician.  No radiographic interpretation.   CT Cervical Spine Wo Contrast Result Date: 01/19/2024 EXAM: CT CERVICAL SPINE WITHOUT CONTRAST 01/19/2024 09:42:30 PM TECHNIQUE: CT of the cervical spine was performed without the administration of intravenous contrast. Multiplanar reformatted images are provided for review. Automated exposure control, iterative reconstruction, and/or weight based adjustment of the mA/kV was utilized to reduce the radiation dose to as low as reasonably achievable. COMPARISON: Comparison with CT 11/30/2023. CLINICAL HISTORY: level 2 on thinners FINDINGS: CERVICAL SPINE: BONES AND ALIGNMENT: Chronic anterolisthesis of C3. No acute fracture or traumatic malalignment. DEGENERATIVE CHANGES: Multilevel spondylosis, disc space height loss, and degenerative endplate changes which is greatest at C4-C5. Small multilevel  facet arthropathy. No severe spinal canal narrowing. SOFT TISSUES: No prevertebral soft tissue swelling. IMPRESSION: 1. No acute abnormality of the cervical spine. Electronically signed by: Norman Gatlin MD 01/19/2024 10:10 PM EST RP Workstation: [id number]   CT Head Wo Contrast Result Date: 01/19/2024 EXAM: CT HEAD WITHOUT CONTRAST 01/19/2024 09:42:30 PM TECHNIQUE: CT of the head was performed without the administration of intravenous contrast. Automated exposure control, iterative reconstruction, and/or weight based adjustment of the mA/kV  was utilized to reduce the radiation dose to as low as reasonably achievable. COMPARISON: CT 11/30/2023 CLINICAL HISTORY: fall, delirium FINDINGS: BRAIN AND VENTRICLES: No acute hemorrhage. No evidence of acute infarct. No hydrocephalus. No extra-axial collection. No mass effect or midline shift. Chronic microvascular ischemia and generalized atrophy. ORBITS: No acute abnormality. SINUSES: No acute abnormality. SOFT TISSUES AND SKULL: No acute soft tissue abnormality. No skull fracture. IMPRESSION: 1. No acute intracranial abnormality. Electronically signed by: Norman Gatlin MD 01/19/2024 10:06 PM EST RP Workstation: [id number]   CT CHEST ABDOMEN PELVIS WO CONTRAST Result Date: 01/19/2024 CLINICAL DATA:  Fall EXAM: CT CHEST, ABDOMEN AND PELVIS WITHOUT CONTRAST TECHNIQUE: Multidetector CT imaging of the chest, abdomen and pelvis was performed following the standard protocol without IV contrast. RADIATION DOSE REDUCTION: This exam was performed according to the departmental dose-optimization program which includes automated exposure control, adjustment of the mA and/or kV according to patient size and/or use of iterative reconstruction technique. COMPARISON:  CT renal stone 12/13/2023. CT chest abdomen and pelvis 11/30/2023. FINDINGS: CT CHEST FINDINGS Cardiovascular: No significant vascular findings. Normal heart size. No pericardial effusion. There are atherosclerotic calcifications of the aorta and coronary arteries. Mediastinum/Nodes: No enlarged mediastinal, hilar, or axillary lymph nodes. Thyroid gland, trachea, and esophagus demonstrate no significant findings. There is a small hiatal hernia. Lungs/Pleura: There are atelectatic changes in the bilateral lower lobes. There some calcified pleural plaques in the inferior right lower hemithorax. There is scarring in both lung apices. There is no pleural effusion or pneumothorax identified. Musculoskeletal: There is nonunion of distal right clavicular fracture  as seen on the prior study. Bones are diffusely osteopenic. There are healing/healed posterior right fourth through tenth rib fractures. There also multiple healing/healed anterior right rib fractures. There is questionable acute nondisplaced anterior right second rib fracture there are healing posterior left a, ninth and eleventh rib fractures. There is age indeterminate posterior left tenth rib fracture. These all appear new from 11/30/2023. CT ABDOMEN PELVIS FINDINGS Hepatobiliary: Gallstones are present. No focal liver lesion. No biliary ductal dilatation. Pancreas: There is diffuse fatty infiltration of the pancreas. Spleen: Normal in size without focal abnormality. Adrenals/Urinary Tract: There is a 2 mm calculus in the mid left ureter with mild left-sided hydronephrosis and left perinephric stranding and fluid. The adrenal glands, right kidney and bladder are within normal limits. Stomach/Bowel: Stomach is within normal limits. Appendix appears normal. No evidence of bowel wall thickening, distention, or inflammatory changes. There is sigmoid colon diverticulosis. There is a small hiatal hernia. Vascular/Lymphatic: There severe atherosclerotic calcifications of the aorta. Abdominal aortic aneurysm measures 4.0 cm, unchanged. 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 No enlarged lymph nodes are identified. Reproductive: Prostate gland is enlarged. Other: No abdominal wall hernia or abnormality. No abdominopelvic ascites. Musculoskeletal: The bones are diffusely osteopenic. No acute fractures are identified. 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 IMPRESSION: 1. 2 mm calculus in  the mid left ureter with mild obstructive uropathy. 2. Multiple healing/healed bilateral rib fractures. There is questionable acute nondisplaced anterior right second rib fracture. 3.  Age-indeterminate posterior left tenth rib fracture. 4. Cholelithiasis. 5. Stable abdominal aortic aneurysm measuring 4.0 cm. 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 6. Aortic atherosclerosis. Aortic Atherosclerosis (ICD10-I70.0). Electronically Signed   By: Greig Pique M.D.   On: 01/19/2024 20:31   DG Chest Port 1 View Result Date: 01/19/2024 EXAM: 1 VIEW(S) XRAY OF THE CHEST 01/19/2024 07:47:00 PM COMPARISON: 12/03/2023 CLINICAL HISTORY: Questionable sepsis - evaluate for abnormality FINDINGS: LUNGS AND PLEURA: Surgical suture at right lung base. Chronic interstitial coarsening. Chronic scarring at right lung base. No pleural effusion. No pneumothorax. HEART AND MEDIASTINUM: Aortic atherosclerosis is present. The cardiac silhouette is unremarkable. BONES AND SOFT TISSUES: No acute osseous abnormality. IMPRESSION: 1. No acute cardiopulmonary process identified. 2. 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 Electronically signed by: Norman Gatlin MD 01/19/2024 07:58 PM EST RP Workstation: [id number]

## 2024-01-21 NOTE — Progress Notes (Signed)
 Pharmacy Electrolyte Replacement  Recent Labs:  Recent Labs    01/21/24 0314 01/21/24 1313  K 4.2 3.9  MG 1.8  --   PHOS 4.4  --   CREATININE 2.16* 1.91*    Low Critical Values (K </= 2.5, Phos </= 1, Mg </= 1) Present: None  Plan: Give magnesium sulfate 2g IV   Langford Carias, PharmD
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
## 2024-01-21 NOTE — Progress Notes (Signed)
 Inpatient Rehab Admissions Coordinator Note:   Per therapy patient was screened for CIR candidacy by Jerry [name] Jerry [name], CCC-SLP. At this time, pt appears to be a potential candidate for CIR. If pt would like to be considered, please place an IP Rehab MD consult order.    Tinnie Yvone Cohens, MS, CCC-SLP Admissions Coordinator [phone] 01/21/24 4:30 PM

## 2024-01-21 NOTE — Progress Notes (Signed)
   Got lasix 20mg  -. WEnt from 4L [place] to Room air  Doubtmaine at 1.5 caused coox to go to 60 > then at 2mcg > coox is 90 LEvophed 3mcg DIC paratmetners improving but platelet worse now at < 50K - d/w Dr Loralee of [facility]  - ok to hold DAPT as needed Overall better but DIC sloiwly improving  Plan  - continue to hold DAPT -> restart 01/22/24 asap - lasix as needd -continue very low dose doubtamine and levophed to titrate     SIGNATURE    Dr. Dorethia Cave, M.D., F.C.C.P,  Pulmonary and Critical Care Medicine Staff Physician, [facility] Director - Interstitial Lung Disease  Program  Pulmonary Fibrosis [facility] [place], [place], [address]   Pager: [phone], If no answer  -> Check AMION or Try [phone] Telephone (clinical office): [phone] Telephone (research): [phone]  5:12 PM 01/21/2024
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
## 2024-01-21 NOTE — Progress Notes (Addendum)
 eLink Physician-Brief Progress Note Patient Name: Jerry [name] DOB: Oct 29, 1928 MRN: [id number]   Date of Service  01/21/2024  HPI/Events of Note  AM Mag 1.8   with GFR 28   has c-line  eICU Interventions  Replacement ordered     Intervention Category Minor Interventions: Electrolytes abnormality - evaluation and management  [name] 01/21/2024, 5:26 AM  06:44 ? New onset  fib.  Stat EKG:  Camera: now in sinus 108. MAP good  Discussed with RN.get EKG, got mag replacement earlier, potassium > 4

## 2024-01-21 NOTE — Progress Notes (Signed)
 Urology Inpatient Progress Report  AKI (acute kidney injury) [N17.9] Acute pyelonephritis [N10] Sepsis (HCC) [A41.9] Left nephrolithiasis [N20.0] Sepsis, due to unspecified organism, unspecified whether acute organ dysfunction present (HCC) [A41.9]  Procedure(s): CYSTOSCOPY, WITH RETROGRADE PYELOGRAM AND URETERAL STENT INSERTION  2 Days Post-Op   Intv/Subj: No acute events overnight. Patient is without complaint.  Still feels weak.  Weaning pressors.  Principal Problem:   Sepsis (HCC) Active Problems:   Acute pyelonephritis  Current Facility-Administered Medications  Medication Dose Route Frequency Provider Last Rate Last Admin   atorvastatin  (LIPITOR) tablet 40 mg  40 mg Oral Daily Albustami, Omar M, MD       cefTRIAXone (ROCEPHIN) 2 g in sodium chloride  0.9 % 100 mL IVPB  2 g Intravenous Q24H Geronimo Amel, MD   Stopped at 01/21/24 0618   Chlorhexidine  Gluconate Cloth 2 % PADS 6 each  6 each Topical Daily Autry, Lauren E, PA-C   6 each at 01/21/24 9046   DOBUTamine (DOBUTREX) infusion 4000 mcg/mL  0-2.5 mcg/kg/min Intravenous Continuous Geronimo Amel, MD 1.36 mL/hr at 01/21/24 1200 1.25 mcg/kg/min at 01/21/24 1200   docusate sodium  (COLACE) capsule 100 mg  100 mg Oral BID PRN Autry, Lauren E, PA-C       famotidine  (PEPCID ) IVPB 20 mg premix  20 mg Intravenous Daily Albustami, Omar M, MD   Stopped at 01/21/24 1019   fentaNYL  (SUBLIMAZE ) injection 12.5-25 mcg  12.5-25 mcg Intravenous Q2H PRN Ramaswamy, Murali, MD   25 mcg at 01/21/24 0342   ipratropium-albuterol (DUONEB) 0.5-2.5 (3) MG/3ML nebulizer solution 3 mL  3 mL Nebulization Q6H PRN Dub Mancel HERO, MD   3 mL at 01/21/24 0556   multivitamin with minerals tablet 1 tablet  1 tablet Oral Daily Albustami, Mancel HERO, MD       norepinephrine (LEVOPHED) 4mg  in 250mL (0.016 mg/mL) premix infusion  0-40 mcg/min Intravenous Titrated Autry, Lauren E, PA-C 7.5 mL/hr at 01/21/24 1200 2 mcg/min at 01/21/24 1200   Oral care mouth  rinse  15 mL Mouth Rinse PRN Albustami, Mancel HERO, MD       polyethylene glycol (MIRALAX  / GLYCOLAX ) packet 17 g  17 g Oral Daily PRN Albustami, Omar M, MD       senna-docusate (Senokot-S) tablet 1 tablet  1 tablet Oral QHS Dub Mancel HERO, MD         Objective: Vital: Vitals:   01/21/24 1120 01/21/24 1123 01/21/24 1145 01/21/24 1200  BP: (!) 97/53  (!) 85/65 (!) 82/52  Pulse: 99  99 (!) 103  Resp: (!) 23  (!) 21 16  Temp:  97.9 F (36.6 C)    TempSrc:  Oral    SpO2: 95%  94% 96%  Weight:      Height:       I/Os: I/O last 3 completed shifts: In: 4715.5 [I.V.:1872.4; IV Piggyback:2843.2] Out: 580 [Urine:580]  Physical Exam:  General: Patient is in no apparent distress Lungs: Normal respiratory effort, chest expands symmetrically. GI: The abdomen is soft and nontender without mass. Foley: Draining red but thin urine Ext: lower extremities symmetric  Lab Results: Recent Labs    01/20/24 0219 01/20/24 1958 01/21/24 0314  WBC 20.5* 40.4* 39.0*  HGB 11.8* 11.4* 10.8*  HCT 37.5* 34.8* 32.4*   Recent Labs    01/20/24 0219 01/20/24 1958 01/21/24 0314  NA 136 134* 133*  K 3.7 3.9 4.2  CL 106 104 103  CO2 13* 16* 18*  GLUCOSE 111* 162* 130*  BUN 24* 33*  38*  CREATININE 1.99* 2.07* 2.16*  CALCIUM  8.3* 7.3* 7.3*   Recent Labs    01/19/24 1944 01/20/24 1130 01/21/24 0314  INR 1.3* 2.0* 1.8*   No results for input(s): LABURIN in the last 72 hours. Results for orders placed or performed during the hospital encounter of 01/19/24  Urine Culture     Status: Abnormal   Collection Time: 01/19/24  7:34 PM   Specimen: Urine, Random  Result Value Ref Range Status   Specimen Description URINE, RANDOM  Final   Special Requests   Final    NONE Reflexed from [id number] Performed at [facility], [address], [place], [place] [address]    Culture >=100,000 COLONIES/mL ESCHERICHIA COLI (A)  Final   Report Status 01/21/2024 FINAL  Final   Organism ID, Bacteria  ESCHERICHIA COLI (A)  Final      Susceptibility   Escherichia coli - MIC*    AMPICILLIN 8 SENSITIVE Sensitive     CEFAZOLIN  (URINE) Value in next row Sensitive      2 SENSITIVEThis is a modified FDA-approved test that has been validated and its performance characteristics determined by the reporting laboratory.  This laboratory is certified under the Clinical Laboratory Improvement Amendments CLIA as qualified to perform high complexity clinical laboratory testing.    CEFEPIME Value in next row Sensitive      2 SENSITIVEThis is a modified FDA-approved test that has been validated and its performance characteristics determined by the reporting laboratory.  This laboratory is certified under the Clinical Laboratory Improvement Amendments CLIA as qualified to perform high complexity clinical laboratory testing.    ERTAPENEM Value in next row Sensitive      2 SENSITIVEThis is a modified FDA-approved test that has been validated and its performance characteristics determined by the reporting laboratory.  This laboratory is certified under the Clinical Laboratory Improvement Amendments CLIA as qualified to perform high complexity clinical laboratory testing.    CEFTRIAXONE Value in next row Sensitive      2 SENSITIVEThis is a modified FDA-approved test that has been validated and its performance characteristics determined by the reporting laboratory.  This laboratory is certified under the Clinical Laboratory Improvement Amendments CLIA as qualified to perform high complexity clinical laboratory testing.    CIPROFLOXACIN Value in next row Sensitive      2 SENSITIVEThis is a modified FDA-approved test that has been validated and its performance characteristics determined by the reporting laboratory.  This laboratory is certified under the Clinical Laboratory Improvement Amendments CLIA as qualified to perform high complexity clinical laboratory testing.    GENTAMICIN Value in next row Sensitive      2  SENSITIVEThis is a modified FDA-approved test that has been validated and its performance characteristics determined by the reporting laboratory.  This laboratory is certified under the Clinical Laboratory Improvement Amendments CLIA as qualified to perform high complexity clinical laboratory testing.    NITROFURANTOIN Value in next row Sensitive      2 SENSITIVEThis is a modified FDA-approved test that has been validated and its performance characteristics determined by the reporting laboratory.  This laboratory is certified under the Clinical Laboratory Improvement Amendments CLIA as qualified to perform high complexity clinical laboratory testing.    TRIMETH/SULFA Value in next row Sensitive      2 SENSITIVEThis is a modified FDA-approved test that has been validated and its performance characteristics determined by the reporting laboratory.  This laboratory is certified under the Clinical Laboratory Improvement Amendments CLIA as  qualified to perform high complexity clinical laboratory testing.    AMPICILLIN/SULBACTAM Value in next row Sensitive      2 SENSITIVEThis is a modified FDA-approved test that has been validated and its performance characteristics determined by the reporting laboratory.  This laboratory is certified under the Clinical Laboratory Improvement Amendments CLIA as qualified to perform high complexity clinical laboratory testing.    PIP/TAZO Value in next row Sensitive      <=4 SENSITIVEThis is a modified FDA-approved test that has been validated and its performance characteristics determined by the reporting laboratory.  This laboratory is certified under the Clinical Laboratory Improvement Amendments CLIA as qualified to perform high complexity clinical laboratory testing.    MEROPENEM Value in next row Sensitive      <=4 SENSITIVEThis is a modified FDA-approved test that has been validated and its performance characteristics determined by the reporting laboratory.  This  laboratory is certified under the Clinical Laboratory Improvement Amendments CLIA as qualified to perform high complexity clinical laboratory testing.    * >=100,000 COLONIES/mL ESCHERICHIA COLI  Blood Culture (routine x 2)     Status: Abnormal (Preliminary result)   Collection Time: 01/19/24  7:44 PM   Specimen: BLOOD  Result Value Ref Range Status   Specimen Description BLOOD SITE NOT SPECIFIED  Final   Special Requests   Final    BOTTLES DRAWN AEROBIC AND ANAEROBIC Blood Culture adequate volume   Culture  Setup Time   Final    GRAM NEGATIVE RODS IN BOTH AEROBIC AND ANAEROBIC BOTTLES CRITICAL RESULT CALLED TO, READ BACK BY AND VERIFIED WITH: PHARMD J LEDFORD 01/20/2024 @ 0622 BY AB    Culture (A)  Final    ESCHERICHIA COLI SUSCEPTIBILITIES TO FOLLOW Performed at [facility], [address] [place], [place] [address]    Report Status PENDING  Incomplete  Blood Culture ID Panel (Reflexed)     Status: Abnormal   Collection Time: 01/19/24  7:44 PM  Result Value Ref Range Status   Enterococcus faecalis NOT DETECTED NOT DETECTED Final   Enterococcus Faecium NOT DETECTED NOT DETECTED Final   Listeria monocytogenes NOT DETECTED NOT DETECTED Final   Staphylococcus species NOT DETECTED NOT DETECTED Final   Staphylococcus aureus (BCID) NOT DETECTED NOT DETECTED Final   Staphylococcus epidermidis NOT DETECTED NOT DETECTED Final   Staphylococcus lugdunensis NOT DETECTED NOT DETECTED Final   Streptococcus species NOT DETECTED NOT DETECTED Final   Streptococcus agalactiae NOT DETECTED NOT DETECTED Final   Streptococcus pneumoniae NOT DETECTED NOT DETECTED Final   Streptococcus pyogenes NOT DETECTED NOT DETECTED Final   A.calcoaceticus-baumannii NOT DETECTED NOT DETECTED Final   Bacteroides fragilis NOT DETECTED NOT DETECTED Final   Enterobacterales DETECTED (A) NOT DETECTED Final    Comment: Enterobacterales represent a large order of gram negative bacteria, not a single  organism. CRITICAL RESULT CALLED TO, READ BACK BY AND VERIFIED WITH: PHARMD J LEDFORD 01/20/2024 @ 0622 BY AB    Enterobacter cloacae complex NOT DETECTED NOT DETECTED Final   Escherichia coli DETECTED (A) NOT DETECTED Final    Comment: CRITICAL RESULT CALLED TO, READ BACK BY AND VERIFIED WITH: PHARMD J LEDFORD 01/20/2024 @ 0622 BY AB    Klebsiella aerogenes NOT DETECTED NOT DETECTED Final   Klebsiella oxytoca NOT DETECTED NOT DETECTED Final   Klebsiella pneumoniae NOT DETECTED NOT DETECTED Final   Proteus species NOT DETECTED NOT DETECTED Final   Salmonella species NOT DETECTED NOT DETECTED Final   Serratia marcescens NOT DETECTED NOT  DETECTED Final   Haemophilus influenzae NOT DETECTED NOT DETECTED Final   Neisseria meningitidis NOT DETECTED NOT DETECTED Final   Pseudomonas aeruginosa NOT DETECTED NOT DETECTED Final   Stenotrophomonas maltophilia NOT DETECTED NOT DETECTED Final   Candida albicans NOT DETECTED NOT DETECTED Final   Candida auris NOT DETECTED NOT DETECTED Final   Candida glabrata NOT DETECTED NOT DETECTED Final   Candida krusei NOT DETECTED NOT DETECTED Final   Candida parapsilosis NOT DETECTED NOT DETECTED Final   Candida tropicalis NOT DETECTED NOT DETECTED Final   Cryptococcus neoformans/gattii NOT DETECTED NOT DETECTED Final   CTX-M ESBL NOT DETECTED NOT DETECTED Final   Carbapenem resistance IMP NOT DETECTED NOT DETECTED Final   Carbapenem resistance KPC NOT DETECTED NOT DETECTED Final   Carbapenem resistance NDM NOT DETECTED NOT DETECTED Final   Carbapenem resist OXA 48 LIKE NOT DETECTED NOT DETECTED Final   Carbapenem resistance VIM NOT DETECTED NOT DETECTED Final    Comment: Performed at [facility], [address] [place], [place] [address]  Blood Culture (routine x 2)     Status: None (Preliminary result)   Collection Time: 01/19/24  8:26 PM   Specimen: BLOOD  Result Value Ref Range Status   Specimen Description BLOOD SITE NOT SPECIFIED  Final    Special Requests   Final    BOTTLES DRAWN AEROBIC AND ANAEROBIC Blood Culture adequate volume   Culture  Setup Time   Final    GRAM NEGATIVE RODS IN BOTH AEROBIC AND ANAEROBIC BOTTLES CRITICAL VALUE NOTED.  VALUE IS CONSISTENT WITH PREVIOUSLY REPORTED AND CALLED VALUE.    Culture   Final    GRAM NEGATIVE RODS IDENTIFICATION TO FOLLOW Performed at [facility], [address], [place], [place] [address]    Report Status PENDING  Incomplete  Resp panel by RT-PCR (RSV, Flu A&B, Covid) Anterior Nasal Swab     Status: None   Collection Time: 01/19/24  8:29 PM   Specimen: Anterior Nasal Swab  Result Value Ref Range Status   SARS Coronavirus 2 by RT PCR NEGATIVE NEGATIVE Final   Influenza A by PCR NEGATIVE NEGATIVE Final   Influenza B by PCR NEGATIVE NEGATIVE Final    Comment: (NOTE) The Xpert Xpress SARS-CoV-2/FLU/RSV plus assay is intended as an aid in the diagnosis of influenza from Nasopharyngeal swab specimens and should not be used as a sole basis for treatment. Nasal washings and aspirates are unacceptable for Xpert Xpress SARS-CoV-2/FLU/RSV testing.  Fact Sheet for Patients: bloggercourse.com  Fact Sheet for Healthcare Providers: seriousbroker.it  This test is not yet approved or cleared by the [place]  FDA and has been authorized for detection and/or diagnosis of SARS-CoV-2 by FDA under an Emergency Use Authorization (EUA). This EUA will remain in effect (meaning this test can be used) for the duration of the COVID-19 declaration under Section 564(b)(1) of the Act, 21 U.S.C. section 360bbb-3(b)(1), unless the authorization is terminated or revoked.     Resp Syncytial Virus by PCR NEGATIVE NEGATIVE Final    Comment: (NOTE) Fact Sheet for Patients: bloggercourse.com  Fact Sheet for Healthcare Providers: seriousbroker.it  This test is not yet approved or  cleared by the [place]  FDA and has been authorized for detection and/or diagnosis of SARS-CoV-2 by FDA under an Emergency Use Authorization (EUA). This EUA will remain in effect (meaning this test can be used) for the duration of the COVID-19 declaration under Section 564(b)(1) of the Act, 21 U.S.C. section 360bbb-3(b)(1), unless the  authorization is terminated or revoked.  Performed at [facility], [address] [place], [place] [address]   MRSA Next Gen by PCR, Nasal     Status: None   Collection Time: 01/20/24 12:32 AM   Specimen: Nasal Mucosa; Nasal Swab  Result Value Ref Range Status   MRSA by PCR Next Gen NOT DETECTED NOT DETECTED Final    Comment: (NOTE) The GeneXpert MRSA Assay (FDA approved for NASAL specimens only), is one component of a comprehensive MRSA colonization surveillance program. It is not intended to diagnose MRSA infection nor to guide or monitor treatment for MRSA infections. Test performance is not FDA approved in patients less than 18 years old. Performed at [facility], [address] [place], [place] [address]     Studies/Results: ECHOCARDIOGRAM COMPLETE Result Date: 01/20/2024    ECHOCARDIOGRAM REPORT   Patient Name:   Jerry [name] Earwood Date of Exam: 01/20/2024 Medical Rec #:  [id number]        Height:       65.0 in Accession #:    [id number]       Weight:       160.1 lb Date of Birth:  25-Feb-1928        BSA:          1.799 m Patient Age:    89 years         BP:           115/59 mmHg Patient Gender: M                HR:           73 bpm. Exam Location:  Inpatient Procedure: 2D Echo, Cardiac Doppler, Color Doppler and Intracardiac            Opacification Agent (Both Spectral and Color Flow Doppler were            utilized during procedure). Indications:    Shock R57.9  History:        Patient has no prior history of Echocardiogram examinations.                 Signs/Symptoms:Shortness of Breath and Dyspnea; Risk                  Factors:Dyslipidemia, Hypertension and Pre-diabetes.  Sonographer:    Koleen Popper RDCS Referring Phys: [id number] OMAR M ALBUSTAMI IMPRESSIONS  1. No left ventricular thrombus is seen (Definity contrast was used). Left ventricular ejection fraction, by estimation, is 35 to 40%. The left ventricle has moderately decreased function. The left ventricle demonstrates global hypokinesis. Left ventricular diastolic parameters are consistent with Grade I diastolic dysfunction (impaired relaxation).  2. Right ventricular systolic function is moderately reduced. The right ventricular size is not well visualized. Tricuspid regurgitation signal is inadequate for assessing PA pressure.  3. Left atrial size was mildly dilated.  4. The mitral valve is degenerative. No evidence of mitral valve regurgitation. No evidence of mitral stenosis. Moderate mitral annular calcification.  5. There appears to be low flow low gradient mild aortic stenosis. The aortic valve is tricuspid. There is moderate calcification of the aortic valve. There is moderate thickening of the aortic valve. Aortic valve regurgitation is trivial. Mild aortic valve stenosis.  6. The inferior vena cava is dilated in size with <50% respiratory variability, suggesting right atrial pressure of 15 mmHg. FINDINGS  Left Ventricle: No left ventricular thrombus is seen (Definity contrast was used). Left ventricular ejection fraction,  by estimation, is 35 to 40%. The left ventricle has moderately decreased function. The left ventricle demonstrates global hypokinesis.  Definity contrast agent was given IV to delineate the left ventricular endocardial borders. The left ventricular internal cavity size was normal in size. There is borderline left ventricular hypertrophy of the basal-septal segment. Left ventricular diastolic parameters are consistent with Grade I diastolic dysfunction (impaired relaxation). Normal left ventricular filling pressure. Right Ventricle: The right  ventricular size is not well visualized. Right vetricular wall thickness was not well visualized. Right ventricular systolic function is moderately reduced. Tricuspid regurgitation signal is inadequate for assessing PA pressure. Left Atrium: Left atrial size was mildly dilated. Right Atrium: Right atrial size was normal in size. Pericardium: There is no evidence of pericardial effusion. Mitral Valve: The mitral valve is degenerative in appearance. There is mild thickening of the mitral valve leaflet(s). Moderate mitral annular calcification. No evidence of mitral valve regurgitation. No evidence of mitral valve stenosis. Tricuspid Valve: The tricuspid valve is normal in structure. Tricuspid valve regurgitation is mild. Aortic Valve: There appears to be low flow low gradient mild aortic stenosis. The aortic valve is tricuspid. There is moderate calcification of the aortic valve. There is moderate thickening of the aortic valve. Aortic valve regurgitation is trivial. Mild aortic stenosis is present. Aortic valve mean gradient measures 4.5 mmHg. Aortic valve peak gradient measures 8.5 mmHg. Aortic valve area, by VTI measures 1.36 cm. Pulmonic Valve: The pulmonic valve was not well visualized. Aorta: The aortic root and ascending aorta are structurally normal, with no evidence of dilitation. Venous: The inferior vena cava is dilated in size with less than 50% respiratory variability, suggesting right atrial pressure of 15 mmHg. IAS/Shunts: No atrial level shunt detected by color flow Doppler.  LEFT VENTRICLE PLAX 2D LVIDd:         4.30 cm     Diastology LVIDs:         3.00 cm     LV e' medial:    5.66 cm/s LV PW:         1.00 cm     LV E/e' medial:  9.3 LV IVS:        1.30 cm     LV e' lateral:   4.68 cm/s LVOT diam:     1.90 cm     LV E/e' lateral: 11.3 LV SV:         37 LV SV Index:   21 LVOT Area:     2.84 cm  LV Volumes (MOD) LV vol d, MOD A4C: 74.5 ml LV vol s, MOD A4C: 45.0 ml LV SV MOD A4C:     29.5 ml RIGHT  VENTRICLE            IVC RV S prime:     6.64 cm/s  IVC diam: 2.50 cm TAPSE (M-mode): 1.7 cm LEFT ATRIUM             Index        RIGHT ATRIUM           Index LA diam:        3.80 cm 2.11 cm/m   RA Area:     13.30 cm LA Vol (A2C):   36.9 ml 20.51 ml/m  RA Volume:   29.80 ml  16.56 ml/m LA Vol (A4C):   29.0 ml 16.12 ml/m LA Biplane Vol: 33.0 ml 18.34 ml/m  AORTIC VALVE AV Area (Vmax):    1.48 cm AV Area (Vmean):   1.39  cm AV Area (VTI):     1.36 cm AV Vmax:           145.79 cm/s AV Vmean:          100.740 cm/s AV VTI:            0.273 m AV Peak Grad:      8.5 mmHg AV Mean Grad:      4.5 mmHg LVOT Vmax:         76.00 cm/s LVOT Vmean:        49.300 cm/s LVOT VTI:          0.131 m LVOT/AV VTI ratio: 0.48  AORTA Ao Root diam: 3.20 cm Ao Asc diam:  3.20 cm MITRAL VALVE MV Area (PHT): 3.12 cm    SHUNTS MV Decel Time: 243 msec    Systemic VTI:  0.13 m MV E velocity: 52.90 cm/s  Systemic Diam: 1.90 cm MV A velocity: 98.00 cm/s MV E/A ratio:  0.54 Mihai Croitoru MD Electronically signed by Jerel Balding MD Signature Date/Time: 01/20/2024/1:19:29 PM    Final    DG Chest Port 1 View Result Date: 01/20/2024 CLINICAL DATA:  Respiratory distress. EXAM: PORTABLE CHEST 1 VIEW COMPARISON:  Prior today, and chest CT on 01/19/2024 FINDINGS: The heart size is within normal limits. Right jugular central venous catheter remains in appropriate position. Surgical staples again seen in the right lung base. Bibasilar scarring shows no significant change. No evidence of superimposed pulmonary consolidation or pleural effusion. IMPRESSION: Bibasilar scarring. No acute findings. Electronically Signed   By: Norleen DELENA Kil M.D.   On: 01/20/2024 11:50   DG CHEST PORT 1 VIEW Result Date: 01/20/2024 EXAM: 1 VIEW(S) XRAY OF THE CHEST 01/20/2024 02:56:50 AM COMPARISON: Portable chest yesterday at 7:44 pm, chest CT yesterday at 8:13 pm. CLINICAL HISTORY: [id number] Encounter for central line placement [id number] Encounter for central line  placement [id number] FINDINGS: LINES, TUBES AND DEVICES: There is a new right IJ (internal jugular) central line with the tip at or slightly below the superior cavoatrial junction. LUNGS AND PLEURA: There are postsurgical changes and scarring in the right lung base, increased coarse linear atelectatic markings in the left lower lobe distribution. No pleural effusion. No pneumothorax. HEART AND MEDIASTINUM: Stable cardiomegaly. No vascular congestion is seen. There is tortuosity and atherosclerosis of the aorta with stable mediastinum. BONES AND SOFT TISSUES: No acute osseous abnormality. IMPRESSION: 1. Right IJ central line with the tip at or slightly below the superior cavoatrial junction. No pneumothorax. 2. Lungs are clear of infiltrates. 3. Stable cardiomegaly without vascular congestion. Electronically signed by: Francis Quam MD 01/20/2024 03:36 AM EST RP Workstation: [id number]   DG C-Arm 1-60 Min-No Report Result Date: 01/20/2024 Fluoroscopy was utilized by the requesting physician.  No radiographic interpretation.   CT Cervical Spine Wo Contrast Result Date: 01/19/2024 EXAM: CT CERVICAL SPINE WITHOUT CONTRAST 01/19/2024 09:42:30 PM TECHNIQUE: CT of the cervical spine was performed without the administration of intravenous contrast. Multiplanar reformatted images are provided for review. Automated exposure control, iterative reconstruction, and/or weight based adjustment of the mA/kV was utilized to reduce the radiation dose to as low as reasonably achievable. COMPARISON: Comparison with CT 11/30/2023. CLINICAL HISTORY: level 2 on thinners FINDINGS: CERVICAL SPINE: BONES AND ALIGNMENT: Chronic anterolisthesis of C3. No acute fracture or traumatic malalignment. DEGENERATIVE CHANGES: Multilevel spondylosis, disc space height loss, and degenerative endplate changes which is greatest at C4-C5. Small multilevel facet arthropathy. No severe spinal canal narrowing. SOFT TISSUES: No prevertebral soft tissue  swelling. IMPRESSION: 1. No acute abnormality of the cervical spine. Electronically signed by: Norman Gatlin MD 01/19/2024 10:10 PM EST RP Workstation: [id number]   CT Head Wo Contrast Result Date: 01/19/2024 EXAM: CT HEAD WITHOUT CONTRAST 01/19/2024 09:42:30 PM TECHNIQUE: CT of the head was performed without the administration of intravenous contrast. Automated exposure control, iterative reconstruction, and/or weight based adjustment of the mA/kV was utilized to reduce the radiation dose to as low as reasonably achievable. COMPARISON: CT 11/30/2023 CLINICAL HISTORY: fall, delirium FINDINGS: BRAIN AND VENTRICLES: No acute hemorrhage. No evidence of acute infarct. No hydrocephalus. No extra-axial collection. No mass effect or midline shift. Chronic microvascular ischemia and generalized atrophy. ORBITS: No acute abnormality. SINUSES: No acute abnormality. SOFT TISSUES AND SKULL: No acute soft tissue abnormality. No skull fracture. IMPRESSION: 1. No acute intracranial abnormality. Electronically signed by: Norman Gatlin MD 01/19/2024 10:06 PM EST RP Workstation: [id number]   CT CHEST ABDOMEN PELVIS WO CONTRAST Result Date: 01/19/2024 CLINICAL DATA:  Fall EXAM: CT CHEST, ABDOMEN AND PELVIS WITHOUT CONTRAST TECHNIQUE: Multidetector CT imaging of the chest, abdomen and pelvis was performed following the standard protocol without IV contrast. RADIATION DOSE REDUCTION: This exam was performed according to the departmental dose-optimization program which includes automated exposure control, adjustment of the mA and/or kV according to patient size and/or use of iterative reconstruction technique. COMPARISON:  CT renal stone 12/13/2023. CT chest abdomen and pelvis 11/30/2023. FINDINGS: CT CHEST FINDINGS Cardiovascular: No significant vascular findings. Normal heart size. No pericardial effusion. There are atherosclerotic calcifications of the aorta and coronary arteries. Mediastinum/Nodes: No enlarged mediastinal,  hilar, or axillary lymph nodes. Thyroid gland, trachea, and esophagus demonstrate no significant findings. There is a small hiatal hernia. Lungs/Pleura: There are atelectatic changes in the bilateral lower lobes. There some calcified pleural plaques in the inferior right lower hemithorax. There is scarring in both lung apices. There is no pleural effusion or pneumothorax identified. 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 Bones are diffusely osteopenic. There are healing/healed posterior right fourth through tenth rib fractures. There also multiple healing/healed anterior right rib fractures. There is questionable acute nondisplaced anterior right second rib fracture there are healing posterior left a, ninth and eleventh rib fractures. There is age indeterminate posterior left tenth rib fracture. These all appear new from 11/30/2023. CT ABDOMEN PELVIS FINDINGS Hepatobiliary: Gallstones are present. No focal liver lesion. No biliary ductal dilatation. Pancreas: There is diffuse fatty infiltration of the pancreas. Spleen: Normal in size without focal abnormality. Adrenals/Urinary Tract: There is a 2 mm calculus in the mid left ureter with mild left-sided hydronephrosis and left perinephric stranding and fluid. The adrenal glands, right kidney and bladder are within normal limits. Stomach/Bowel: Stomach is within normal limits. Appendix appears normal. No evidence of bowel wall thickening, distention, or inflammatory changes. There is sigmoid colon diverticulosis. There is a small hiatal hernia. Vascular/Lymphatic: There severe atherosclerotic calcifications of the aorta. Abdominal aortic aneurysm measures 4.0 cm, unchanged. 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 No enlarged lymph nodes are identified. Reproductive: Prostate gland is enlarged. Other: No abdominal  wall hernia or abnormality. No abdominopelvic ascites. Musculoskeletal: The bones are diffusely osteopenic. No acute fractures are identified. 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 IMPRESSION: 1. 2 mm calculus in the mid left ureter with mild obstructive uropathy. 2. Multiple healing/healed bilateral rib  fractures. There is questionable acute nondisplaced anterior right second rib fracture. 3. Age-indeterminate posterior left tenth rib fracture. 4. Cholelithiasis. 5. Stable abdominal aortic aneurysm measuring 4.0 cm. 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 6. Aortic atherosclerosis. Aortic Atherosclerosis (ICD10-I70.0). Electronically Signed   By: Greig Pique M.D.   On: 01/19/2024 20:31   DG Chest Port 1 View Result Date: 01/19/2024 EXAM: 1 VIEW(S) XRAY OF THE CHEST 01/19/2024 07:47:00 PM COMPARISON: 12/03/2023 CLINICAL HISTORY: Questionable sepsis - evaluate for abnormality FINDINGS: LUNGS AND PLEURA: Surgical suture at right lung base. Chronic interstitial coarsening. Chronic scarring at right lung base. No pleural effusion. No pneumothorax. HEART AND MEDIASTINUM: Aortic atherosclerosis is present. The cardiac silhouette is unremarkable. BONES AND SOFT TISSUES: No acute osseous abnormality. IMPRESSION: 1. No acute cardiopulmonary process identified. 2. 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 Electronically signed by: Norman Gatlin MD 01/19/2024 07:58 PM EST RP Workstation: [id number]    Assessment: Bilateral ureteral calculi Septic shock secondary to urinary tract infection  Procedure(s): CYSTOSCOPY, WITH RETROGRADE PYELOGRAM AND URETERAL STENT INSERTION, 2 Days Post-Op  doing well.  Plan: Continue Foley catheter for now.  Continue antibiotics and critical care management.  Anticipate some degree of hematuria.  Nursing may irrigate the catheter as  needed.   Sherwood Edison, MD Urology 01/21/2024, 12:06 PM

## 2024-01-22 LAB — COOXEMETRY PANEL
Carboxyhemoglobin: 1.8 % — ABNORMAL HIGH (ref 0.5–1.5)
Carboxyhemoglobin: 1.4 % (ref 0.5–1.5)
Methemoglobin: <0.7 % (ref 0.0–1.5)
Methemoglobin: 0.9 % (ref 0.0–1.5)
O2 Saturation: 56.7 %
O2 Saturation: 57.8 %
Total hemoglobin: 11.5 g/dL — ABNORMAL LOW (ref 12.0–16.0)
Total hemoglobin: 11.3 g/dL — ABNORMAL LOW (ref 12.0–16.0)

## 2024-01-22 LAB — COMPREHENSIVE METABOLIC PANEL WITH GFR
ALT: 145 U/L — ABNORMAL HIGH (ref 0–44)
AST: 80 U/L — ABNORMAL HIGH (ref 15–41)
Albumin: 2.5 g/dL — ABNORMAL LOW (ref 3.5–5.0)
Alkaline Phosphatase: 168 U/L — ABNORMAL HIGH (ref 38–126)
Anion gap: 10 (ref 5–15)
BUN: 44 mg/dL — ABNORMAL HIGH (ref 8–23)
CO2: 21 mmol/L — ABNORMAL LOW (ref 22–32)
Calcium: 7.9 mg/dL — ABNORMAL LOW (ref 8.9–10.3)
Chloride: 105 mmol/L (ref 98–111)
Creatinine, Ser: 1.5 mg/dL — ABNORMAL HIGH (ref 0.61–1.24)
GFR, Estimated: 43 mL/min — ABNORMAL LOW (ref >60)
Glucose, Bld: 128 mg/dL — ABNORMAL HIGH (ref 70–99)
Potassium: 3.9 mmol/L (ref 3.5–5.1)
Sodium: 136 mmol/L (ref 135–145)
Total Bilirubin: 0.7 mg/dL (ref 0.0–1.2)
Total Protein: 5 g/dL — ABNORMAL LOW (ref 6.5–8.1)

## 2024-01-22 LAB — CULTURE, BLOOD (ROUTINE X 2)
Special Requests: ADEQUATE
Special Requests: ADEQUATE

## 2024-01-22 LAB — LACTIC ACID, PLASMA
Lactic Acid, Venous: 1.8 mmol/L (ref 0.5–1.9)
Lactic Acid, Venous: 1.4 mmol/L (ref 0.5–1.9)
Lactic Acid, Venous: 2.1 mmol/L (ref 0.5–1.9)
Lactic Acid, Venous: 1.8 mmol/L (ref 0.5–1.9)

## 2024-01-22 LAB — CBC
HCT: 33.3 % — ABNORMAL LOW (ref 39.0–52.0)
Hemoglobin: 11.1 g/dL — ABNORMAL LOW (ref 13.0–17.0)
MCH: 31.7 pg (ref 26.0–34.0)
MCHC: 33.3 g/dL (ref 30.0–36.0)
MCV: 95.1 fL (ref 80.0–100.0)
Platelets: 38 K/uL — ABNORMAL LOW (ref 150–400)
RBC: 3.5 MIL/uL — ABNORMAL LOW (ref 4.22–5.81)
RDW: 14.9 % (ref 11.5–15.5)
WBC: 46.4 K/uL — ABNORMAL HIGH (ref 4.0–10.5)
nRBC: 0 % (ref 0.0–0.2)

## 2024-01-22 LAB — PROTIME-INR
INR: 1.2 (ref 0.8–1.2)
Prothrombin Time: 16.2 s — ABNORMAL HIGH (ref 11.4–15.2)

## 2024-01-22 LAB — MAGNESIUM: Magnesium: 2.5 mg/dL — ABNORMAL HIGH (ref 1.7–2.4)

## 2024-01-22 LAB — BRAIN NATRIURETIC PEPTIDE: B Natriuretic Peptide: 821.9 pg/mL — ABNORMAL HIGH (ref 0.0–100.0)

## 2024-01-22 LAB — PHOSPHORUS: Phosphorus: 2.6 mg/dL (ref 2.5–4.6)

## 2024-01-22 MED ORDER — IPRATROPIUM-ALBUTEROL 0.5-2.5 (3) MG/3ML IN SOLN: 3.0000 mL | Freq: Three times a day (TID) | RESPIRATORY_TRACT | Status: DC

## 2024-01-22 MED ORDER — ACETAMINOPHEN 325 MG PO TABS
650.0000 mg | ORAL_TABLET | Freq: Three times a day (TID) | ORAL | Status: DC | PRN
  Administered 2024-01-22 – 2024-01-28 (×11): 650 mg via ORAL
  Filled 2024-01-22 (×11): qty 2

## 2024-01-22 MED ORDER — IPRATROPIUM-ALBUTEROL 0.5-2.5 (3) MG/3ML IN SOLN
3.0000 mL | Freq: Three times a day (TID) | RESPIRATORY_TRACT | Status: DC
  Administered 2024-01-22 – 2024-01-25 (×9): 3 mL via RESPIRATORY_TRACT
  Filled 2024-01-22 (×10): qty 3

## 2024-01-22 MED ORDER — FAMOTIDINE 20 MG PO TABS
20.0000 mg | ORAL_TABLET | Freq: Every day | ORAL | Status: DC
  Administered 2024-01-22 – 2024-01-29 (×8): 20 mg via ORAL
  Filled 2024-01-22 (×8): qty 1

## 2024-01-22 MED ORDER — PHENOL 1.4 % MT LIQD
1.0000 | OROMUCOSAL | Status: DC | PRN
  Administered 2024-01-22 – 2024-01-26 (×4): 1 via OROMUCOSAL
  Filled 2024-01-22: qty 177

## 2024-01-22 NOTE — Progress Notes (Signed)
 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 EMS was called after family heard him fall at home. With EMS, delirious, temp 103 axillary. In ED, initially hypotensive with SBP 80s , tacyhcardic. Labs notable for WBC 14.9, plt 178, CO2 16, sCr 1.75, AST 54, Alk phos 164, AG 16, INR 1.3, UA + UTI, culture and blood cultures sent. BNP 292, pH 7.51, pCO2 19.5. initial I-stat lactic 5.1. was given 2L IVF and repeat lactic increased to 7. CXR with no acute findings. CT head and C-spine negative. CT CAP demonstrating 2mm calculus in mid left ureter with mild obstructive uropathy. Was started on vancomycin, zosyn. Also had mild COPD exacerbation noted, was given solu-medrol , nebs.  Urology was consulted by EDP and he was taken to OR for cystoscopy and possible stent placement. Admitted to CCM post operatively.   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 2.  Cystoscopy with insertion of bilateral ureteral stents. Patient arrives back from the OR on levophed 15mcg, neo 120mcg on NRB. Hard of hearing but states his name.  01/20/24 - s/p CVL. On  o2 On levophed gtt, On Vasopressin gtt On fluids. On abx.  BCID with ENTEROBACTER. Pharmacy increased his ceftriaxone. cREat still at 1.99mg . AWake and confused per RN. Wife Rock and son at bedside. HE wants his hearing aid. He c/o back pain  Developed DIC with poor lactate clearnace New s-cHF with low coox - and low dose doubtamine stared  Interim History / Subjective:  No acute event overnight, much more improved.  Clinically better, no leukocytosis still in the 40K. Off levo this a.m.  Titrating dobutamine from 2-1.25 perfusing well, with negative lactic acid.  Will wean off dobutamine Foley-slight bloody urine without clots Still holding DAPT due to low platelets in the 40s Speech eval-for regular diet, and thin liquid Urine cutlure with PAN SENSITIVE E COLII      Objective   Blood pressure 111/63, pulse 97, temperature 97.8 F (36.6 C), temperature source Axillary, resp. rate 18, height 5' 5 (1.651 m), weight 74.6 kg, SpO2 100%. CVP:  [12 mmHg-14 mmHg] 12 mmHg      Intake/Output Summary (Last 24 hours) at 01/22/2024 0727 Last data filed at 01/22/2024 0600 Gross per 24 hour  Intake 653.74 ml  Output 1030 ml  Net -376.26 ml   Filed Weights   01/20/24 0158 01/21/24 0337 01/22/24 0500  Weight: 72.6 kg 78.3 kg 74.6 kg    Examination: General Appearance: Mentating well, conversational HEENT: NCAT Lungs: Symmetrical chest wall movement, clear to auscultation CVS: S1-S2 normal, no murmur or regurg Abdomen: Soft nontender nondistended Extremities: Warm, mild edema Neuro: AO X3, intact mentation, no focal deficits GU: Deferred     Resolved Hospital Problem list  Assessment & Plan:  Septic shock/2/urosepsis, obstructed B/L ureteral stones s/p stent at admission Acute metabolic encephalopathy-improved Acute pyelonephritis due to E. coli-pansensitive New onset HFrEF echo 01/20/2024 EF 35%, with mild flat troponin Lactic acidosis-improved-weaning off dobutamine AKI-improving DAPT since 01/30/2023 for 2X stent at SFA/popliteal  artery Thrombocytopenia DIC panel positive for 01/20/2024-no schistocytes Hard of hearing baseline Recent fall with small right frontal subdural  - Repeat CT head with no acute findings AAA-Stable on CT CAP on admit       Plan - Off vasopressors, wean off dobutamine as tolerated - Monitor perfusion markers - Continue holding DAPT due to platelets in the 40s - Continue antibiotics ceftriaxone for pansensitive E. coli urine culture - Trend labs - Per urology can remove Foley when not needed    CODE  - dNR/DNI but full medical care  FAMILY  - 01/20/24 - son, wife, patient updated at beside - 01/21/24 - wife, patient and duaghter at bedside 01/22/24-Family updated at bedside    LABS    PULMONARY Recent Labs  Lab 01/19/24 1948 01/20/24 1020 01/20/24 1653 01/21/24 1313 01/21/24 1509  HCO3 15.8*  --   --   --   --   TCO2 16*  16*  --   --   --   --   O2SAT 74 56.9 76 62.9 92.3    CBC Recent Labs  Lab 01/21/24 0314 01/21/24 1535 01/22/24 0341  HGB 10.8* 11.3* 11.1*  HCT 32.4* 34.4* 33.3*  WBC 39.0* 43.6* 46.4*  PLT 69* 47* 38*    COAGULATION Recent Labs  Lab 01/19/24 1944 01/20/24 1130 01/21/24 0314 01/21/24 1535 01/22/24 0341  INR 1.3* 2.0* 1.8* 1.4* 1.2    CARDIAC  No results for input(s): TROPONINI in the last 168 hours. No results for input(s): PROBNP in the last 168 hours.   CHEMISTRY Recent Labs  Lab 01/19/24 1944 01/19/24 1948 01/20/24 0219 01/20/24 1958 01/21/24 0314 01/21/24 1313 01/22/24 0341  NA 138   < > 136 134* 133* 133* 136  K 3.9   < > 3.7 3.9 4.2 3.9 3.9  CL 106   < > 106 104 103 103 105  CO2 16*  --  13* 16* 18* 16* 21*  GLUCOSE 120*   < > 111* 162* 130* 139* 128*  BUN 21   < > 24* 33* 38* 43* 44*  CREATININE 1.75*   < > 1.99* 2.07* 2.16* 1.91* 1.50*  CALCIUM  8.7*  --  8.3* 7.3* 7.3* 7.4* 7.9*  MG 1.5*  --   --   --  1.8  --  2.5*  PHOS 1.5*  --   --   --  4.4  --  2.6   < > = values in this interval not  displayed.   Estimated Creatinine Clearance: 29 mL/min (A) (by C-G formula based on SCr of 1.5 mg/dL (H)).   LIVER Recent Labs  Lab 01/19/24 1944 01/20/24 1130 01/20/24 1958 01/21/24 0314 01/21/24 1535 01/22/24 0341  AST 54*  --  155* 140*  --  80*  ALT 38  --  131* 145*  --  145*  ALKPHOS 164*  --  134* 125  --  168*  BILITOT 1.0  --  0.7 1.0  --  0.7  PROT 5.0*  --  4.7* 4.6*  --  5.0*  ALBUMIN 2.7*  --  2.5* 2.4*  --  2.5*  INR 1.3* 2.0*  --  1.8* 1.4* 1.2     INFECTIOUS Recent  Labs  Lab 01/21/24 0314 01/21/24 1313 01/22/24 0341  LATICACIDVEN 2.8* 2.5* 1.8     ENDOCRINE CBG (last 3)  Recent Labs    01/20/24 0159  GLUCAP 93         IMAGING x48h  - image(s) personally visualized  -   highlighted in bold ECHOCARDIOGRAM COMPLETE Result Date: 01/20/2024    ECHOCARDIOGRAM REPORT   Patient Name:   Jerry [name] Lords Date of Exam: 01/20/2024 Medical Rec #:  [id number]        Height:       65.0 in Accession #:    [id number]       Weight:       160.1 lb Date of Birth:  [birth year]        BSA:          1.799 m Patient Age:    89 years         BP:           115/59 mmHg Patient Gender: M                HR:           73 bpm. Exam Location:  Inpatient Procedure: 2D Echo, Cardiac Doppler, Color Doppler and Intracardiac            Opacification Agent (Both Spectral and Color Flow Doppler were            utilized during procedure). Indications:    Shock R57.9  History:        Patient has no prior history of Echocardiogram examinations.                 Signs/Symptoms:Shortness of Breath and Dyspnea; Risk                 Factors:Dyslipidemia, Hypertension and Pre-diabetes.  Sonographer:    Koleen Popper RDCS Referring Phys: [id number] OMAR M ALBUSTAMI IMPRESSIONS  1. No left ventricular thrombus is seen (Definity contrast was used). Left ventricular ejection fraction, by estimation, is 35 to 40%. The left ventricle has moderately decreased function. The left ventricle demonstrates global  hypokinesis. Left ventricular diastolic parameters are consistent with Grade I diastolic dysfunction (impaired relaxation).  2. Right ventricular systolic function is moderately reduced. The right ventricular size is not well visualized. Tricuspid regurgitation signal is inadequate for assessing PA pressure.  3. Left atrial size was mildly dilated.  4. The mitral valve is degenerative. No evidence of mitral valve regurgitation. No evidence of mitral stenosis. Moderate mitral annular calcification.  5. There appears to be low flow low gradient mild aortic stenosis. The aortic valve is tricuspid. There is moderate calcification of the aortic valve. There is moderate thickening of the aortic valve. Aortic valve regurgitation is trivial. Mild aortic valve stenosis.  6. The inferior vena cava is dilated in size with <50% respiratory variability, suggesting right atrial pressure of 15 mmHg. FINDINGS  Left Ventricle: No left ventricular thrombus is seen (Definity contrast was used). Left ventricular ejection fraction, by estimation, is 35 to 40%. The left ventricle has moderately decreased function. The left ventricle demonstrates global hypokinesis.  Definity contrast agent was given IV to delineate the left ventricular endocardial borders. The left ventricular internal cavity size was normal in size. There is borderline left ventricular hypertrophy of the basal-septal segment. Left ventricular diastolic parameters are consistent with Grade I diastolic dysfunction (impaired relaxation). Normal left ventricular filling pressure. Right Ventricle: The right ventricular size is not well visualized. Right vetricular  wall thickness was not well visualized. Right ventricular systolic function is moderately reduced. Tricuspid regurgitation signal is inadequate for assessing PA pressure. Left Atrium: Left atrial size was mildly dilated. Right Atrium: Right atrial size was normal in size. Pericardium: There is no evidence of  pericardial effusion. Mitral Valve: The mitral valve is degenerative in appearance. There is mild thickening of the mitral valve leaflet(s). Moderate mitral annular calcification. No evidence of mitral valve regurgitation. No evidence of mitral valve stenosis. Tricuspid Valve: The tricuspid valve is normal in structure. Tricuspid valve regurgitation is mild. Aortic Valve: There appears to be low flow low gradient mild aortic stenosis. The aortic valve is tricuspid. There is moderate calcification of the aortic valve. There is moderate thickening of the aortic valve. Aortic valve regurgitation is trivial. Mild aortic stenosis is present. Aortic valve mean gradient measures 4.5 mmHg. Aortic valve peak gradient measures 8.5 mmHg. Aortic valve area, by VTI measures 1.36 cm. Pulmonic Valve: The pulmonic valve was not well visualized. Aorta: The aortic root and ascending aorta are structurally normal, with no evidence of dilitation. Venous: The inferior vena cava is dilated in size with less than 50% respiratory variability, suggesting right atrial pressure of 15 mmHg. IAS/Shunts: No atrial level shunt detected by color flow Doppler.  LEFT VENTRICLE PLAX 2D LVIDd:         4.30 cm     Diastology LVIDs:         3.00 cm     LV e' medial:    5.66 cm/s LV PW:         1.00 cm     LV E/e' medial:  9.3 LV IVS:        1.30 cm     LV e' lateral:   4.68 cm/s LVOT diam:     1.90 cm     LV E/e' lateral: 11.3 LV SV:         37 LV SV Index:   21 LVOT Area:     2.84 cm  LV Volumes (MOD) LV vol d, MOD A4C: 74.5 ml LV vol s, MOD A4C: 45.0 ml LV SV MOD A4C:     29.5 ml RIGHT VENTRICLE            IVC RV S prime:     6.64 cm/s  IVC diam: 2.50 cm TAPSE (M-mode): 1.7 cm LEFT ATRIUM             Index        RIGHT ATRIUM           Index LA diam:        3.80 cm 2.11 cm/m   RA Area:     13.30 cm LA Vol (A2C):   36.9 ml 20.51 ml/m  RA Volume:   29.80 ml  16.56 ml/m LA Vol (A4C):   29.0 ml 16.12 ml/m LA Biplane Vol: 33.0 ml 18.34 ml/m  AORTIC  VALVE AV Area (Vmax):    1.48 cm AV Area (Vmean):   1.39 cm AV Area (VTI):     1.36 cm AV Vmax:           145.79 cm/s AV Vmean:          100.740 cm/s AV VTI:            0.273 m AV Peak Grad:      8.5 mmHg AV Mean Grad:      4.5 mmHg LVOT Vmax:         76.00  cm/s LVOT Vmean:        49.300 cm/s LVOT VTI:          0.131 m LVOT/AV VTI ratio: 0.48  AORTA Ao Root diam: 3.20 cm Ao Asc diam:  3.20 cm MITRAL VALVE MV Area (PHT): 3.12 cm    SHUNTS MV Decel Time: 243 msec    Systemic VTI:  0.13 m MV E velocity: 52.90 cm/s  Systemic Diam: 1.90 cm MV A velocity: 98.00 cm/s MV E/A ratio:  0.54 Mihai Croitoru MD Electronically signed by Jerel Balding MD Signature Date/Time: 01/20/2024/1:19:29 PM    Final    DG Chest Port 1 View Result Date: 01/20/2024 CLINICAL DATA:  Respiratory distress. EXAM: PORTABLE CHEST 1 VIEW COMPARISON:  Prior today, and chest CT on 01/19/2024 FINDINGS: The heart size is within normal limits. Right jugular central venous catheter remains in appropriate position. Surgical staples again seen in the right lung base. Bibasilar scarring shows no significant change. No evidence of superimposed pulmonary consolidation or pleural effusion. IMPRESSION: Bibasilar scarring. No acute findings. Electronically Signed   By: Norleen DELENA Kil M.D.   On: 01/20/2024 11:50       CRITICAL CARE Performed by: Lenny Drought, MD  [facility] Prefer epic messenger for cross cover needs   My critical care time: 30 minutes  Critical care time was exclusive of separately billable procedures and treating other patients.  Critical care was necessary to treat or prevent imminent or life-threatening deterioration.  Critical care was time spent personally by me on the following activities: development of treatment plan with patient and/or surrogate as well as nursing, discussions with consultants, evaluation of patient's response to treatment, examination of patient, obtaining history from  patient or surrogate, ordering and performing treatments and interventions, ordering and review of laboratory studies, ordering and review of radiographic studies, pulse oximetry, re-evaluation of patient's condition and participation in multidisciplinary rounds.

## 2024-01-22 NOTE — Plan of Care (Signed)
   Problem: Education: Goal: Knowledge of General Education information will improve Description: Including pain rating scale, medication(s)/side effects and non-pharmacologic comfort measures Outcome: Progressing   Problem: Clinical Measurements: Goal: Ability to maintain clinical measurements within normal limits will improve Outcome: Progressing Goal: Diagnostic test results will improve Outcome: Progressing

## 2024-01-22 NOTE — Progress Notes (Signed)
 Speech Language Pathology Treatment: Dysphagia  Patient Details Name: Jerry [name] MRN: [id number] DOB: [birth year] Today's Date: 01/22/2024 Time: [id number] SLP Time Calculation (min) (ACUTE ONLY): 19 min  Assessment / Plan / Recommendation Clinical Impression  PLAN: continue regular solids, thin liquids.  Swallowing has improved. No SLP f/u is needed. Our service will sign off.   Impression: Jerry [name] was seated in reclined. Reports sore throat.  No further coughing nor concerns for aspiration when consuming items from lunch tray. Demonstrated thorough mastication, the appearance of a brisk swallow. Voice strong/clear quality.  D/W wife and dtr at the bedside.    HPI HPI: Patient is a 89 yo. male who presented to the hospital from home via EMS on 01/19/2024 following a fall. CXR did not show evidence of superimposed pulmonary consolidation, CT chest/abdomen/pelvis showed multiple healing/healed bilateral rib fractures and left uretal calculus, CTH was negative for acute intracranial abnormality. He was admitted with septic shock secondary to urosepsis. He underwent cystoscopy with uretal stent insertion on 11/28. SLP swallow evaluation ordered after patient failed Yale swallow with RN. PMH: HTN, HLD, AAA, recent fall with SDH.      SLP Plan  All goals met          Recommendations  Diet recommendations: Regular;Thin liquid Liquids provided via: Cup;Straw Medication Administration: Whole meds with liquid Supervision: Patient able to self feed;Staff to assist with self feeding Compensations: Slow rate;Minimize environmental distractions Postural Changes and/or Swallow Maneuvers: Seated upright 90 degrees                  Oral care BID     Dysphagia, oropharyngeal phase (R13.12)     All goals met    Jerry [name] L. Vona, MA CCC/SLP Clinical Specialist - Acute Care SLP Acute Rehabilitation Services Office number [phone]  Jerry [name]  01/22/2024, 2:26  PM
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
## 2024-01-22 NOTE — Progress Notes (Signed)
 3 Days Post-Op  Subjective: Jerry [name] is improving.  He has no complaints of pain but does have a cough.  Foley is draining slightly bloody urine without clots.  Cr is down to 1.5. Lactate has normalized at 1.8  WBC remains elevated at 46.4K with progressive thrombocytopenia with a plt count of 38.  ROS:  Review of Systems  Constitutional:  Negative for fever.  Respiratory:  Positive for cough.   Genitourinary:  Negative for flank pain.    Anti-infectives: Anti-infectives (From admission, onward)    Start     Dose/Rate Route Frequency Ordered Stop   01/21/24 0600  cefTRIAXone (ROCEPHIN) 2 g in sodium chloride  0.9 % 100 mL IVPB        2 g 200 mL/hr over 30 Minutes Intravenous Every 24 hours 01/20/24 1303 01/27/24 0559   01/20/24 2200  cefTRIAXone (ROCEPHIN) 2 g in sodium chloride  0.9 % 100 mL IVPB  Status:  Discontinued        2 g 200 mL/hr over 30 Minutes Intravenous Every 24 hours 01/20/24 0626 01/20/24 1216   01/20/24 2200  cefTRIAXone (ROCEPHIN) 2 g in sodium chloride  0.9 % 100 mL IVPB  Status:  Discontinued        2 g 200 mL/hr over 30 Minutes Intravenous Every 24 hours 01/20/24 1216 01/20/24 1303   01/20/24 0630  cefTRIAXone (ROCEPHIN) 1 g in sodium chloride  0.9 % 100 mL IVPB        1 g 200 mL/hr over 30 Minutes Intravenous  Once 01/20/24 0626 01/21/24 1936   01/20/24 0230  cefTRIAXone (ROCEPHIN) 1 g in sodium chloride  0.9 % 100 mL IVPB  Status:  Discontinued        1 g 200 mL/hr over 30 Minutes Intravenous Daily at bedtime 01/19/24 2354 01/20/24 0626   01/19/24 1945  vancomycin (VANCOCIN) IVPB 1000 mg/200 mL premix        1,000 mg 200 mL/hr over 60 Minutes Intravenous  Once 01/19/24 1940 01/19/24 2209   01/19/24 1945  piperacillin-tazobactam (ZOSYN) IVPB 3.375 g        3.375 g 100 mL/hr over 30 Minutes Intravenous  Once 01/19/24 1940 01/19/24 2058       Current Facility-Administered Medications  Medication Dose Route Frequency Provider Last Rate Last Admin   atorvastatin   (LIPITOR) tablet 40 mg  40 mg Oral Daily Albustami, Omar M, MD       cefTRIAXone (ROCEPHIN) 2 g in sodium chloride  0.9 % 100 mL IVPB  2 g Intravenous Q24H Geronimo Amel, MD   Stopped at 01/22/24 0559   Chlorhexidine  Gluconate Cloth 2 % PADS 6 each  6 each Topical Daily Autry, Lauren E, PA-C   6 each at 01/21/24 0953   DOBUTamine (DOBUTREX) infusion 4000 mcg/mL  0-2.5 mcg/kg/min Intravenous Continuous Geronimo Amel, MD 2.18 mL/hr at 01/22/24 0600 2 mcg/kg/min at 01/22/24 0600   docusate sodium  (COLACE) capsule 100 mg  100 mg Oral BID PRN Autry, Lauren E, PA-C       famotidine  (PEPCID ) IVPB 20 mg premix  20 mg Intravenous Daily Albustami, Omar M, MD   Stopped at 01/21/24 1019   feeding supplement (ENSURE PLUS HIGH PROTEIN) liquid 237 mL  237 mL Oral TID BM Geronimo Amel, MD       fentaNYL  (SUBLIMAZE ) injection 12.5-25 mcg  12.5-25 mcg Intravenous Q2H PRN Ramaswamy, Murali, MD   25 mcg at 01/21/24 0342   ipratropium-albuterol (DUONEB) 0.5-2.5 (3) MG/3ML nebulizer solution 3 mL  3 mL Nebulization Q6H PRN  Dub Mancel HERO, MD   3 mL at 01/21/24 0556   multivitamin with minerals tablet 1 tablet  1 tablet Oral Daily Albustami, Omar M, MD       norepinephrine (LEVOPHED) 4mg  in 250mL (0.016 mg/mL) premix infusion  0-40 mcg/min Intravenous Titrated Autry, Lauren E, PA-C 3.75 mL/hr at 01/22/24 0600 1 mcg/min at 01/22/24 0600   Oral care mouth rinse  15 mL Mouth Rinse PRN Albustami, Mancel HERO, MD       polyethylene glycol (MIRALAX  / GLYCOLAX ) packet 17 g  17 g Oral Daily PRN Albustami, Mancel HERO, MD       senna-docusate (Senokot-S) tablet 1 tablet  1 tablet Oral QHS Dub Mancel HERO, MD       thiamine (VITAMIN B1) tablet 100 mg  100 mg Oral Daily Geronimo Amel, MD         Objective: Vital signs in last 24 hours: Temp:  [97.8 F (36.6 C)-98.2 F (36.8 C)] 97.8 F (36.6 C) (12/01 0341) Pulse Rate:  [84-119] 97 (12/01 0630) Resp:  [14-26] 18 (12/01 0630) BP: (82-152)/(50-107) 111/63 (12/01  0630) SpO2:  [77 %-100 %] 100 % (12/01 0630) Weight:  [74.6 kg] 74.6 kg (12/01 0500)  Intake/Output from previous day: 11/30 0701 - 12/01 0700 In: 653.7 [I.V.:291.8; IV Piggyback:362] Out: 1030 [Urine:1030] Intake/Output this shift: No intake/output data recorded.   Physical Exam Vitals reviewed.  Constitutional:      Appearance: Normal appearance.  Cardiovascular:     Rate and Rhythm: Normal rate.  Pulmonary:     Effort: Pulmonary effort is normal. No respiratory distress.  Neurological:     Mental Status: He is alert.     Lab Results:  Recent Labs    01/21/24 1535 01/22/24 0341  WBC 43.6* 46.4*  HGB 11.3* 11.1*  HCT 34.4* 33.3*  PLT 47* 38*   BMET Recent Labs    01/21/24 1313 01/22/24 0341  NA 133* 136  K 3.9 3.9  CL 103 105  CO2 16* 21*  GLUCOSE 139* 128*  BUN 43* 44*  CREATININE 1.91* 1.50*  CALCIUM  7.4* 7.9*   PT/INR Recent Labs    01/21/24 1535 01/22/24 0341  LABPROT 17.5* 16.2*  INR 1.4* 1.2   ABG Recent Labs    01/19/24 1948  HCO3 15.8*    Studies/Results: ECHOCARDIOGRAM COMPLETE Result Date: 01/20/2024    ECHOCARDIOGRAM REPORT   Patient Name:   Jerry [name] Date of Exam: 01/20/2024 Medical Rec #:  [id number]        Height:       65.0 in Accession #:    [id number]       Weight:       160.1 lb Date of Birth:  1928-05-14        BSA:          1.799 m Patient Age:    89 years         BP:           115/59 mmHg Patient Gender: M                HR:           73 bpm. Exam Location:  Inpatient Procedure: 2D Echo, Cardiac Doppler, Color Doppler and Intracardiac            Opacification Agent (Both Spectral and Color Flow Doppler were            utilized during procedure). Indications:    Shock R57.9  History:        Patient has no prior history of Echocardiogram examinations.                 Signs/Symptoms:Shortness of Breath and Dyspnea; Risk                 Factors:Dyslipidemia, Hypertension and Pre-diabetes.  Sonographer:    Koleen Popper RDCS  Referring Phys: [id number] OMAR M ALBUSTAMI IMPRESSIONS  1. No left ventricular thrombus is seen (Definity contrast was used). Left ventricular ejection fraction, by estimation, is 35 to 40%. The left ventricle has moderately decreased function. The left ventricle demonstrates global hypokinesis. Left ventricular diastolic parameters are consistent with Grade I diastolic dysfunction (impaired relaxation).  2. Right ventricular systolic function is moderately reduced. The right ventricular size is not well visualized. Tricuspid regurgitation signal is inadequate for assessing PA pressure.  3. Left atrial size was mildly dilated.  4. The mitral valve is degenerative. No evidence of mitral valve regurgitation. No evidence of mitral stenosis. Moderate mitral annular calcification.  5. There appears to be low flow low gradient mild aortic stenosis. The aortic valve is tricuspid. There is moderate calcification of the aortic valve. There is moderate thickening of the aortic valve. Aortic valve regurgitation is trivial. Mild aortic valve stenosis.  6. The inferior vena cava is dilated in size with <50% respiratory variability, suggesting right atrial pressure of 15 mmHg. FINDINGS  Left Ventricle: No left ventricular thrombus is seen (Definity contrast was used). Left ventricular ejection fraction, by estimation, is 35 to 40%. The left ventricle has moderately decreased function. The left ventricle demonstrates global hypokinesis.  Definity contrast agent was given IV to delineate the left ventricular endocardial borders. The left ventricular internal cavity size was normal in size. There is borderline left ventricular hypertrophy of the basal-septal segment. Left ventricular diastolic parameters are consistent with Grade I diastolic dysfunction (impaired relaxation). Normal left ventricular filling pressure. Right Ventricle: The right ventricular size is not well visualized. Right vetricular wall thickness was not well  visualized. Right ventricular systolic function is moderately reduced. Tricuspid regurgitation signal is inadequate for assessing PA pressure. Left Atrium: Left atrial size was mildly dilated. Right Atrium: Right atrial size was normal in size. Pericardium: There is no evidence of pericardial effusion. Mitral Valve: The mitral valve is degenerative in appearance. There is mild thickening of the mitral valve leaflet(s). Moderate mitral annular calcification. No evidence of mitral valve regurgitation. No evidence of mitral valve stenosis. Tricuspid Valve: The tricuspid valve is normal in structure. Tricuspid valve regurgitation is mild. Aortic Valve: There appears to be low flow low gradient mild aortic stenosis. The aortic valve is tricuspid. There is moderate calcification of the aortic valve. There is moderate thickening of the aortic valve. Aortic valve regurgitation is trivial. Mild aortic stenosis is present. Aortic valve mean gradient measures 4.5 mmHg. Aortic valve peak gradient measures 8.5 mmHg. Aortic valve area, by VTI measures 1.36 cm. Pulmonic Valve: The pulmonic valve was not well visualized. Aorta: The aortic root and ascending aorta are structurally normal, with no evidence of dilitation. Venous: The inferior vena cava is dilated in size with less than 50% respiratory variability, suggesting right atrial pressure of 15 mmHg. IAS/Shunts: No atrial level shunt detected by color flow Doppler.  LEFT VENTRICLE PLAX 2D LVIDd:         4.30 cm     Diastology LVIDs:         3.00 cm     LV e' medial:  5.66 cm/s LV PW:         1.00 cm     LV E/e' medial:  9.3 LV IVS:        1.30 cm     LV e' lateral:   4.68 cm/s LVOT diam:     1.90 cm     LV E/e' lateral: 11.3 LV SV:         37 LV SV Index:   21 LVOT Area:     2.84 cm  LV Volumes (MOD) LV vol d, MOD A4C: 74.5 ml LV vol s, MOD A4C: 45.0 ml LV SV MOD A4C:     29.5 ml RIGHT VENTRICLE            IVC RV S prime:     6.64 cm/s  IVC diam: 2.50 cm TAPSE (M-mode):  1.7 cm LEFT ATRIUM             Index        RIGHT ATRIUM           Index LA diam:        3.80 cm 2.11 cm/m   RA Area:     13.30 cm LA Vol (A2C):   36.9 ml 20.51 ml/m  RA Volume:   29.80 ml  16.56 ml/m LA Vol (A4C):   29.0 ml 16.12 ml/m LA Biplane Vol: 33.0 ml 18.34 ml/m  AORTIC VALVE AV Area (Vmax):    1.48 cm AV Area (Vmean):   1.39 cm AV Area (VTI):     1.36 cm AV Vmax:           145.79 cm/s AV Vmean:          100.740 cm/s AV VTI:            0.273 m AV Peak Grad:      8.5 mmHg AV Mean Grad:      4.5 mmHg LVOT Vmax:         76.00 cm/s LVOT Vmean:        49.300 cm/s LVOT VTI:          0.131 m LVOT/AV VTI ratio: 0.48  AORTA Ao Root diam: 3.20 cm Ao Asc diam:  3.20 cm MITRAL VALVE MV Area (PHT): 3.12 cm    SHUNTS MV Decel Time: 243 msec    Systemic VTI:  0.13 m MV E velocity: 52.90 cm/s  Systemic Diam: 1.90 cm MV A velocity: 98.00 cm/s MV E/A ratio:  0.54 Mihai Croitoru MD Electronically signed by Jerel Balding MD Signature Date/Time: 01/20/2024/1:19:29 PM    Final    DG Chest Port 1 View Result Date: 01/20/2024 CLINICAL DATA:  Respiratory distress. EXAM: PORTABLE CHEST 1 VIEW COMPARISON:  Prior today, and chest CT on 01/19/2024 FINDINGS: The heart size is within normal limits. Right jugular central venous catheter remains in appropriate position. Surgical staples again seen in the right lung base. Bibasilar scarring shows no significant change. No evidence of superimposed pulmonary consolidation or pleural effusion. IMPRESSION: Bibasilar scarring. No acute findings. Electronically Signed   By: Norleen DELENA Kil M.D.   On: 01/20/2024 11:50     Assessment and Plan: Bilateral ureteral stones with sepsis.  Continuing to improve.  Cr falling.  Can remove foley when not needed for medical management.       LOS: 3 days    Norleen Seltzer 01/22/2024

## 2024-01-22 NOTE — Progress Notes (Signed)
 Heart Failure Navigator Progress Note  Assessed for Heart & Vascular TOC clinic readiness.  Patient does not meet criteria due to he is an [place] [facility] clinic patient. NO HF TOC. .   Navigator will sign off at this time.   Stephane Haddock, BSN, Scientist, Clinical (histocompatibility And Immunogenetics) Only

## 2024-01-23 LAB — PHOSPHORUS
Phosphorus: 1.6 mg/dL — ABNORMAL LOW (ref 2.5–4.6)
Phosphorus: 2.5 mg/dL (ref 2.5–4.6)

## 2024-01-23 LAB — CBC
HCT: 31.8 % — ABNORMAL LOW (ref 39.0–52.0)
Hemoglobin: 10.6 g/dL — ABNORMAL LOW (ref 13.0–17.0)
MCH: 31.5 pg (ref 26.0–34.0)
MCHC: 33.3 g/dL (ref 30.0–36.0)
MCV: 94.4 fL (ref 80.0–100.0)
Platelets: 30 K/uL — ABNORMAL LOW (ref 150–400)
RBC: 3.37 MIL/uL — ABNORMAL LOW (ref 4.22–5.81)
RDW: 15 % (ref 11.5–15.5)
WBC: 38.6 K/uL — ABNORMAL HIGH (ref 4.0–10.5)
nRBC: 0.1 % (ref 0.0–0.2)

## 2024-01-23 LAB — BASIC METABOLIC PANEL WITH GFR
Anion gap: 8 (ref 5–15)
BUN: 42 mg/dL — ABNORMAL HIGH (ref 8–23)
CO2: 24 mmol/L (ref 22–32)
Calcium: 8 mg/dL — ABNORMAL LOW (ref 8.9–10.3)
Chloride: 107 mmol/L (ref 98–111)
Creatinine, Ser: 1.18 mg/dL (ref 0.61–1.24)
GFR, Estimated: 58 mL/min — ABNORMAL LOW (ref >60)
Glucose, Bld: 121 mg/dL — ABNORMAL HIGH (ref 70–99)
Potassium: 4.1 mmol/L (ref 3.5–5.1)
Sodium: 139 mmol/L (ref 135–145)

## 2024-01-23 LAB — COOXEMETRY PANEL
Carboxyhemoglobin: 1.5 % (ref 0.5–1.5)
Methemoglobin: 0.7 % (ref 0.0–1.5)
O2 Saturation: 55.9 %
Total hemoglobin: 11.5 g/dL — ABNORMAL LOW (ref 12.0–16.0)

## 2024-01-23 LAB — LACTIC ACID, PLASMA: Lactic Acid, Venous: 1.6 mmol/L (ref 0.5–1.9)

## 2024-01-23 LAB — MAGNESIUM: Magnesium: 2.4 mg/dL (ref 1.7–2.4)

## 2024-01-23 MED ORDER — SODIUM PHOSPHATES 45 MMOLE/15ML IV SOLN
30.0000 mmol | Freq: Once | INTRAVENOUS | Status: AC
  Administered 2024-01-23: 30 mmol via INTRAVENOUS
  Filled 2024-01-23: qty 10

## 2024-01-23 MED ORDER — MELATONIN 5 MG PO TABS
5.0000 mg | ORAL_TABLET | Freq: Every evening | ORAL | Status: DC | PRN
  Administered 2024-01-23: 5 mg via ORAL
  Filled 2024-01-23: qty 1

## 2024-01-23 NOTE — Consult Note (Signed)
 WOC Nurse Consult Note: Reason for Consult: Consult requested for buttocks.  Pt is critically ill with multiple systemic factors which can impair healing.  He has a low air loss mattress to reduce pressure.  Pt states he spends most of his time in a recliner prior to admission.  His skin across bilat buttocks and sacrum is darker red-purple colored; appearance is consistent with chronic tissue damage, related to prolonged sitting and shear. Affected area is approx 5X5cm Left buttock with black linear Deep tissue pressure injury; 1X.2cm with intact skin.  This was noted as present on admission by the bedside nurses' wound care flow sheet.  Deep tissue pressure injuries are high risk to evolve into full thickness tissue damage within 7-10 days. Discussed plan of care with family members at the bedside.     Dressing procedure/placement/frequency: Topical treatment orders provided for bedside nurses to perform as follows: Apply Xeroform gauze to sacrum Q day, then cover with foam dressing.  Change foam dressing Q 3 days or PRN .  Please re-consult if further assistance is needed.  Thank-you,  Stephane Fought MSN, RN, CWOCN, CWCN-AP, CNS Contact Mon-Fri 0700-1500: [phone]
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
## 2024-01-23 NOTE — Progress Notes (Signed)
  Inpatient Rehabilitation Admissions Coordinator   Met with patient, spouse and daughter at bedside for rehab assessment. Recently at [facility] 12/05/23 until 12/19/23. Went home at supervision level.  We discussed goals and expectations of a possible CIR re admit. They prefer CIR for rehab. Family can provide expected caregiver support that is recommended. I await further medical progress and will follow. Please call me with any questions.   Heron Leavell, RN, MSN Rehab Admissions Coordinator [phone]

## 2024-01-23 NOTE — Plan of Care (Signed)
   Problem: Clinical Measurements: Goal: Ability to maintain clinical measurements within normal limits will improve Outcome: Progressing Goal: Diagnostic test results will improve Outcome: Progressing   Problem: Activity: Goal: Risk for activity intolerance will decrease Outcome: Progressing
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
## 2024-01-23 NOTE — Progress Notes (Signed)
 Pharmacy Electrolyte Replacement  Recent Labs:  Recent Labs    01/23/24 0327  K 4.1  MG 2.4  PHOS 1.6*  CREATININE 1.18    Low Critical Values (K </= 2.5, Phos </= 1, Mg </= 1) Present: None  MD Contacted: n/a  Plan: 30 mmol Na Phos IV x1 Repeat @ 2000 phos level per protocol   Rankin Sams, PharmD, BCPS, BCCCP Clinical Pharmacist

## 2024-01-23 NOTE — Progress Notes (Signed)
 eLink Physician-Brief Progress Note Patient Name: Jerry [name] DOB: 11/06/1928 MRN: [id number]   Date of Service  01/23/2024  HPI/Events of Note  Insomnia  eICU Interventions  Melatonin PRN     Intervention Category Minor Interventions: Routine modifications to care plan (e.g. PRN medications for pain, fever)  Daryan Buell 01/23/2024, 8:26 PM

## 2024-01-23 NOTE — Progress Notes (Addendum)
 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 EMS was called after family heard him fall at home. With EMS, delirious, temp 103 axillary. In ED, initially hypotensive with SBP 80s , tacyhcardic. Labs notable for WBC 14.9, plt 178, CO2 16, sCr 1.75, AST 54, Alk phos 164, AG 16, INR 1.3, UA + UTI, culture and blood cultures sent. BNP 292, pH 7.51, pCO2 19.5. initial I-stat lactic 5.1. was given 2L IVF and repeat lactic increased to 7. CXR with no acute findings. CT head and C-spine negative. CT CAP demonstrating 2mm calculus in mid left ureter with mild obstructive uropathy. Was started on vancomycin, zosyn. Also had mild COPD exacerbation noted, was given solu-medrol , nebs.  Urology was consulted by EDP and he was taken to OR for cystoscopy and possible stent placement. Admitted to CCM post operatively.   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 2.  Cystoscopy with insertion of bilateral ureteral stents. Patient arrives back from the OR on levophed 15mcg, neo 120mcg on NRB. Hard of hearing but states his name.  01/20/24 - s/p CVL. On [place] o2 On levophed gtt, On Vasopressin gtt On fluids. On abx.  BCID with ENTEROBACTER. Pharmacy increased his ceftriaxone. cREat still at 1.99mg . AWake and confused per RN. Wife Rock and son at bedside. HE wants his hearing aid. He c/o back pain  Developed DIC with poor lactate clearnace New s-cHF with low coox - and low dose doubtamine stared  Interim History / Subjective:  No acute event overnight, improving  Was on dobutamine 1.2, perfusing well, DC'd this a.m. on 12/2, repeat perfusion markers in the p.m. normal  Otherwise mentating well, has a Foley adequate UOP, tolerating regular diet and thin liquid  Leukocytosis slightly improving 38 from 46, no fever, on ceftriaxone Urine culture pansensitive to E. coli Still holding DAPT due to low platelets in the 30s       Objective   Blood pressure (!) 140/77, pulse 100, temperature 97.9 F (36.6 C), temperature source Oral, resp. rate 20, height 5' 5 (1.651 m), weight 73 kg, SpO2 98%. CVP:  [10 mmHg-18 mmHg] 16 mmHg      Intake/Output Summary (Last 24 hours) at 01/23/2024 0936 Last data filed at 01/23/2024 0900 Gross per 24 hour  Intake 215.13 ml  Output 1325 ml  Net -1109.87 ml   Filed Weights   01/21/24 0337 01/22/24 0500 01/23/24 0500  Weight: 78.3 kg 74.6 kg 73 kg    Examination: General Appearance: Laying in bed, not in acute distress, conversational HEENT: NCAT Lungs: Symmetrical chest wall movement, clear to auscultation CVS: S1-S2 normal, no murmur or regurg Abdomen: Soft nontender nondistended Extremities: Warm with mild edema Neuro: AAO X3, conversational, intact mentation, motor and sensory.  No focal  deficit GU: Foley      Resolved Hospital Problem list    Assessment & Plan:  Septic shock/2/urosepsis, obstructed B/L ureteral stones s/p stent at admission Acute metabolic encephalopathy-improved Acute pyelonephritis due to E. coli-pansensitive New onset HFrEF echo 01/20/2024 EF 35%, with mild flat troponin Lactic acidosis-improved-weaning off  dobutamine AKI-improving DAPT since 01/30/2023 for 2X stent at SFA/popliteal artery Thrombocytopenia DIC panel positive for 01/20/2024-no schistocytes Hard of hearing baseline Recent fall with small right frontal subdural  - Repeat CT head with no acute findings AAA-Stable on CT CAP on admit       Plan - Perfusing well, DC'd dobutamine AM of 12/2, repeat perfusion markers normal.  - Continue holding DAPT due to platelets in the 30s - Continue to antibiotics ceftriaxone for pansensitive E. coli urine culture - Transfer to the medical floor - Urology team on board  CODE  - dNR/DNI but full medical care  FAMILY  - 01/20/24 - son, wife, patient updated at beside - 01/21/24 - wife, patient and duaghter at bedside 01/22/24-Family updated at bedside 01/23/2024-family (wife, and daughters)updated at bedside    LABS    PULMONARY Recent Labs  Lab 01/19/24 1948 01/20/24 1020 01/20/24 1653 01/21/24 1313 01/21/24 1509 01/22/24 1038 01/22/24 1946  HCO3 15.8*  --   --   --   --   --   --   TCO2 16*  16*  --   --   --   --   --   --   O2SAT 74   < > 76 62.9 92.3 56.7 57.8   < > = values in this interval not displayed.    CBC Recent Labs  Lab 01/21/24 1535 01/22/24 0341 01/23/24 0327  HGB 11.3* 11.1* 10.6*  HCT 34.4* 33.3* 31.8*  WBC 43.6* 46.4* 38.6*  PLT 47* 38* 30*    COAGULATION Recent Labs  Lab 01/19/24 1944 01/20/24 1130 01/21/24 0314 01/21/24 1535 01/22/24 0341  INR 1.3* 2.0* 1.8* 1.4* 1.2    CARDIAC  No results for input(s): TROPONINI in the last 168 hours. No results for input(s): PROBNP in the last 168 hours.   CHEMISTRY Recent Labs  Lab 01/19/24 1944 01/19/24 1948 01/20/24 1958 01/21/24 0314 01/21/24 1313 01/22/24 0341 01/23/24 0327  NA 138   < > 134* 133* 133* 136 139  K 3.9   < > 3.9 4.2 3.9 3.9 4.1  CL 106   < > 104 103 103 105 107  CO2 16*   < > 16* 18* 16* 21* 24  GLUCOSE 120*   < > 162* 130* 139* 128* 121*  BUN 21   < >  33* 38* 43* 44* 42*  CREATININE 1.75*   < > 2.07* 2.16* 1.91* 1.50* 1.18  CALCIUM  8.7*   < > 7.3* 7.3* 7.4* 7.9* 8.0*  MG 1.5*  --   --  1.8  --  2.5* 2.4  PHOS 1.5*  --   --  4.4  --  2.6 1.6*   < > = values in this interval not displayed.   Estimated Creatinine Clearance: 34 mL/min (by C-G formula based on SCr of 1.18 mg/dL).   LIVER Recent Labs  Lab 01/19/24 1944 01/20/24 1130 01/20/24 1958 01/21/24 0314 01/21/24 1535 01/22/24 0341  AST 54*  --  155* 140*  --  80*  ALT 38  --  131* 145*  --  145*  ALKPHOS 164*  --  134* 125  --  168*  BILITOT 1.0  --  0.7 1.0  --  0.7  PROT 5.0*  --  4.7* 4.6*  --  5.0*  ALBUMIN 2.7*  --  2.5* 2.4*  --  2.5*  INR 1.3* 2.0*  --  1.8* 1.4* 1.2     INFECTIOUS Recent Labs  Lab 01/22/24 1038 01/22/24 1500 01/22/24 1946  LATICACIDVEN 1.4 2.1* 1.8     ENDOCRINE CBG (last 3)  No results for input(s): GLUCAP in the last 72 hours.        IMAGING x48h  - image(s) personally visualized  -   highlighted in bold No results found.      CRITICAL CARE  Lenny Drought, MD  [facility] Prefer epic messenger for cross cover needs   My critical care time: 30 minutes  Critical care time was exclusive of separately billable procedures and treating other patients.  Critical care was necessary to treat or prevent imminent or life-threatening deterioration.  Critical care was time spent personally by me on the following activities: development of treatment plan with patient and/or surrogate as well as nursing, discussions with consultants, evaluation of patient's response to treatment, examination of patient, obtaining history from patient or surrogate, ordering and performing treatments and interventions, ordering and review of laboratory studies, ordering and review of radiographic studies, pulse oximetry, re-evaluation of patient's condition and participation in multidisciplinary rounds.

## 2024-01-24 ENCOUNTER — Inpatient Hospital Stay (HOSPITAL_COMMUNITY)

## 2024-01-24 DIAGNOSIS — N179 Acute kidney failure, unspecified: Secondary | ICD-10-CM

## 2024-01-24 DIAGNOSIS — E440 Moderate protein-calorie malnutrition: Secondary | ICD-10-CM | POA: Diagnosis not present

## 2024-01-24 DIAGNOSIS — M7989 Other specified soft tissue disorders: Secondary | ICD-10-CM | POA: Diagnosis not present

## 2024-01-24 DIAGNOSIS — N135 Crossing vessel and stricture of ureter without hydronephrosis: Secondary | ICD-10-CM | POA: Diagnosis not present

## 2024-01-24 DIAGNOSIS — R609 Edema, unspecified: Secondary | ICD-10-CM

## 2024-01-24 DIAGNOSIS — R6520 Severe sepsis without septic shock: Secondary | ICD-10-CM

## 2024-01-24 DIAGNOSIS — A4151 Sepsis due to Escherichia coli [E. coli]: Secondary | ICD-10-CM | POA: Diagnosis not present

## 2024-01-24 DIAGNOSIS — N10 Acute tubulo-interstitial nephritis: Secondary | ICD-10-CM | POA: Diagnosis not present

## 2024-01-24 LAB — CBC
HCT: 35 % — ABNORMAL LOW (ref 39.0–52.0)
Hemoglobin: 11.5 g/dL — ABNORMAL LOW (ref 13.0–17.0)
MCH: 31 pg (ref 26.0–34.0)
MCHC: 32.9 g/dL (ref 30.0–36.0)
MCV: 94.3 fL (ref 80.0–100.0)
Platelets: 53 K/uL — ABNORMAL LOW (ref 150–400)
RBC: 3.71 MIL/uL — ABNORMAL LOW (ref 4.22–5.81)
RDW: 15.4 % (ref 11.5–15.5)
WBC: 26.6 K/uL — ABNORMAL HIGH (ref 4.0–10.5)
nRBC: 0.1 % (ref 0.0–0.2)

## 2024-01-24 LAB — BASIC METABOLIC PANEL WITH GFR
Anion gap: 6 (ref 5–15)
BUN: 27 mg/dL — ABNORMAL HIGH (ref 8–23)
CO2: 24 mmol/L (ref 22–32)
Calcium: 8.2 mg/dL — ABNORMAL LOW (ref 8.9–10.3)
Chloride: 110 mmol/L (ref 98–111)
Creatinine, Ser: 0.89 mg/dL (ref 0.61–1.24)
GFR, Estimated: >60 mL/min (ref >60)
Glucose, Bld: 102 mg/dL — ABNORMAL HIGH (ref 70–99)
Potassium: 4.2 mmol/L (ref 3.5–5.1)
Sodium: 140 mmol/L (ref 135–145)

## 2024-01-24 MED ORDER — HYDROCOD POLI-CHLORPHE POLI ER 10-8 MG/5ML PO SUER
5.0000 mL | Freq: Every evening | ORAL | Status: DC | PRN
  Administered 2024-01-24: 5 mL via ORAL
  Filled 2024-01-24: qty 5

## 2024-01-24 MED ORDER — POLYETHYLENE GLYCOL 3350 17 G PO PACK
17.0000 g | PACK | Freq: Every day | ORAL | Status: DC
  Administered 2024-01-24 – 2024-01-26 (×3): 17 g via ORAL
  Filled 2024-01-24 (×3): qty 1

## 2024-01-24 NOTE — Care Management Important Message (Signed)
 Important Message  Patient Details  Name: Jerry [name] MRN: [id number] Date of Birth: 08-28-1928   Important Message Given:  Yes - Medicare IM     Jennie Laneta Dragon 01/24/2024, 11:31 AM

## 2024-01-24 NOTE — Plan of Care (Signed)

## 2024-01-24 NOTE — PMR Pre-admission (Signed)
 PMR Admission Coordinator Pre-Admission Assessment  Patient: Jerry [name] is an 89 y.o., male MRN: [id number] DOB: March 13, 1930Height: 5' 5 (165.1 cm) Weight: 73 kg  Insurance Information HMO:     PPO:      PCP:      IPA:      80/20:      OTHER:  PRIMARY: Medicare Part A and B      Policy#: [id number]      Subscriber: pt Benefits:  Phone #: passport one source online 12/3     Name:  Eff. Date: 02/22/1995     Deduct: $1676      Out of Pocket Max: none      Life Max: none CIR: 100%      SNF: 20 full days Outpatient: 80%     Co-Pay: 20% Home Health: 100%      Co-Pay: none DME: 80%     Co-Pay: 20% Providers: pt choice  SECONDARY: BCBS supplement      Policy#: [id number]     Phone#: [phone]  Financial Counselor:       Phone#:   The "Data Collection Information Summary" for patients in Inpatient Rehabilitation Facilities with attached "Privacy Act Statement-Health Care Records" was provided and verbally reviewed with: Patient and Family  Emergency Contact Information Contact Information     Name Relation Home Work Mobile   Askari,GLENDA Spouse [phone]  [phone]   Mcginnis,WIliam [name] Blades   [phone]      Other Contacts     Name Relation Home Work Mobile   Cannon,Cara Daughter   [phone]       Current Medical History  Patient Admitting Diagnosis: urosepsis, debility  History of Present Illness: Jerry [name] is a 89 year old right-handed male with history significant for AAA, anemia of chronic disease hearing loss, hypertension, hyperlipidemia, macular degeneration, popliteal aneurysm status post stenting by vascular surgery 2024 maintained on aspirin  and Plavix  per Dr. Selinda Gu, traumatic subdural hematoma after a fall as well as nondisplaced right clavicle fracture and received CIR 12/05/2023 - 12/19/2023 and was discharged to home with family requiring supervision for bed mobility and ambulates household distances with a rollator and arrangements  were made for home health therapies. Wife does assist with some basic ADLs.  Presented 01/19/2024 after being found down after hearing a loud noise in the bathroom patient noted to have altered mental status.    In the ED patient was tachycardic as well as hypotensive with fever of 103 cranial CT scan showed no acute intracranial abnormality.  No skull fracture.  No mass effect or midline shift.  CT cervical spine negative.  CT of the chest abdomen and pelvis showed 2 mm calculus in the mid left ureter with mild obstructive uropathy.  Multiple healing/healed bilateral rib fractures.  There was question acute nondisplaced anterior right second rib fracture.  Age-indeterminate posterior left 10th rib fracture.  Stable abdominal aortic aneurysm measuring 4 cm.  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  Admission chemistries unremarkable except CO2 16 glucose 120, creatinine 1.75, AST 54, WBC 14,900, hemoglobin 12.5, platelets 103,000, blood cultures E. coli, BNP 292.3, lactic acid 5.1-7.0, troponin elevated 147-125.  Patient was placed on broad-spectrum antibiotic with vancomycin  and Zosyn  for sepsis as well as receiving 2 L LR boluses, DuoNeb and Solu-Medrol .    Urology Dr. Norleen Seltzer consulted in regards to bilateral ureteral stones with left obstruction undergoing cystoscopy with bilateral retrograde pyelography and bilateral ureteral stents 01/20/2024.  Hospital  course septic shock secondary to acute pyelonephritis and obstructive uropathy with evidence of E. coli bacteremia.  Patient initially required ICU admission was started on dobutamine, vasopressin, phenylephrine and norepinephrine.  Vasopressors weaned off with treatment of infection.  His empiric vancomycin and Zosyn transition to ceftriaxone with leukocytosis initially peaked to 43,600 improved to 26,600-21,500.  His Foley tube remains in place mild hematuria and monitored with hemoglobin stable 11.5.  New onset  HFrEF question etiology felt troponin positive related to demand ischemia/sepsis.  Transthoracic echocardiogram significant for LVEF of 35 to 40% with global hypokinesis.  Patient did receive diuresis.  Lactic acidosis resolved with management of sepsis.  AKI complicated by mild hydronephrosis from ureteral obstruction improved with latest creatinine 0.89.  Incidental findings of right upper extremity SVT diagnosed 12/3 with supportive care.  In regards to patient's history of PAD managed on DAPT for stent x 2 SFA/popliteal artery antiplatelets held secondary to sepsis and thrombocytopenia with latest platelet count 53,000.  Pressure injury of right/mid/left sacrum with wound care as directed.  He is tolerating a regular consistency diet   Patient's medical record from [facility] has been reviewed by the rehabilitation admission coordinator and physician.  Past Medical History  Past Medical History:  Diagnosis Date   AAA (abdominal aortic aneurysm)    Aneurysm    History of multiple vascular aneurysms. [name] involve the aorta, te iliac arteries, and the popliteal arteries.  The pt is s/p stent graft of an abdominal aortic aneurysm. All of his aneurysm follow up as been done at [facility].   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 This is manifested and is elevated, sed rate, pleuritis, Raynaud's pehnomenon and she does have a positive rheumatoid factor.  He is being treated with Plquenil for his collagen vascular disease   Colonic polyp    History of nephrolithiasis    Hyperlipidemia    Hypertension    Macular degeneration    Osteoarthrosis, hip    Left hip   Paget's disease    Has the patient had major surgery during 100 days prior to admission? Yes  Family History   family history includes Alzheimer's disease in his father; Heart attack (age of onset: 22) in his mother.  Current Medications  Current Facility-Administered Medications:     acetaminophen  (TYLENOL ) tablet 650 mg, 650 mg, Oral, Q8H PRN, Sharie Bourbon, MD, 650 mg at 01/25/24 9191   atorvastatin  (LIPITOR) tablet 40 mg, 40 mg, Oral, Daily, Albustami, Omar M, MD, 40 mg at 01/25/24 9191   cefTRIAXone (ROCEPHIN) 2 g in sodium chloride  0.9 % 100 mL IVPB, 2 g, Intravenous, Q24H, Ramaswamy, Murali, MD, Last Rate: 200 mL/hr at 01/25/24 0540, 2 g at 01/25/24 0540   Chlorhexidine  Gluconate Cloth 2 % PADS 6 each, 6 each, Topical, Daily, Adolph Tinnie BRAVO, PA-C, 6 each at 01/24/24 1115   chlorpheniramine-HYDROcodone (TUSSIONEX) 10-8 MG/5ML suspension 5 mL, 5 mL, Oral, QHS PRN, Briana Elgin LABOR, MD, 5 mL at 01/24/24 2041   docusate sodium  (COLACE) capsule 100 mg, 100 mg, Oral, BID PRN, Adolph, Lauren E, PA-C, 100 mg at 01/24/24 1103   famotidine  (PEPCID ) tablet 20 mg, 20 mg, Oral, Daily, Hussein, Abdullahi, MD, 20 mg at 01/25/24 9191   feeding supplement (ENSURE PLUS HIGH PROTEIN) liquid 237 mL, 237 mL, Oral, TID BM, Ramaswamy, Murali, MD, 237 mL at 01/24/24 2041   fentaNYL  (SUBLIMAZE ) injection 12.5-25 mcg, 12.5-25 mcg, Intravenous, Q2H PRN, Geronimo Amel, MD, 25 mcg at  01/21/24 0342   ipratropium-albuterol (DUONEB) 0.5-2.5 (3) MG/3ML nebulizer solution 3 mL, 3 mL, Nebulization, TID, Hussein, Abdullahi, MD, 3 mL at 01/25/24 9167   melatonin tablet 5 mg, 5 mg, Oral, QHS PRN, Paliwal, Aditya, MD, 5 mg at 01/23/24 2108   multivitamin with minerals tablet 1 tablet, 1 tablet, Oral, Daily, Albustami, Omar M, MD, 1 tablet at 01/25/24 9191   Oral care mouth rinse, 15 mL, Mouth Rinse, PRN, Albustami, Omar M, MD   phenol (CHLORASEPTIC) mouth spray 1 spray, 1 spray, Mouth/Throat, PRN, Sharie Bourbon, MD, 1 spray at 01/24/24 0509   polyethylene glycol (MIRALAX  / GLYCOLAX ) packet 17 g, 17 g, Oral, Daily, Briana Elgin LABOR, MD, 17 g at 01/25/24 9191   senna-docusate (Senokot-S) tablet 1 tablet, 1 tablet, Oral, QHS, Albustami, Mancel HERO, MD, 1 tablet at 01/24/24 2041   thiamine (VITAMIN B1)  tablet 100 mg, 100 mg, Oral, Daily, Geronimo Amel, MD, 100 mg at 01/25/24 0808  Patients Current Diet:  Diet Order             Diet 2 gram sodium Room service appropriate? Yes; Fluid consistency: Thin; Fluid restriction: 1500 mL Fluid  Diet effective now                  Precautions / Restrictions Precautions Precautions: Fall Precaution/Restrictions Comments: watch BP Restrictions Weight Bearing Restrictions Per Provider Order: Yes RUE Weight Bearing Per Provider Order: Weight bearing as tolerated Other Position/Activity Restrictions: NWB RUE from fx R clavicle 7.5 weeks ago, Dr Sharie Attending agreeable to progress to [facility] and to note any pain   Has the patient had 2 or more falls or a fall with injury in the past year? No  Prior Activity Level Community (5-7x/wk): was Mod I RW and Supervision on stairs after CIR 10/25  Prior Functional Level Self Care: Did the patient need help bathing, dressing, using the toilet or eating? Needed some help  Indoor Mobility: Did the patient need assistance with walking from room to room (with or without device)? Independent  Stairs: Did the patient need assistance with internal or external stairs (with or without device)? Needed some help  Functional Cognition: Did the patient need help planning regular tasks such as shopping or remembering to take medications? Needed some help  Patient Information Are you of Hispanic, Latino/a,or Spanish origin?: A. No, not of Hispanic, Latino/a, or Spanish origin What is your race?: A. White Do you need or want an interpreter to communicate with a doctor or health care staff?: 0. No  Patient's Response To:  Health Literacy and Transportation Is the patient able to respond to health literacy and transportation needs?: Yes Health Literacy - How often do you need to have someone help you when you read instructions, pamphlets, or other written material from your doctor or pharmacy?: Rarely In the  past 12 months, has lack of transportation kept you from medical appointments or from getting medications?: No In the past 12 months, has lack of transportation kept you from meetings, work, or from getting things needed for daily living?: No  Home Assistive Devices / Equipment Home Equipment: Grab bars - tub/shower, Agricultural Consultant (2 wheels), Toilet riser, Other (comment), Wheelchair - manual, Rollator (4 wheels), Shower seat, Hand held shower head (hurrycane; urinal; bed rails)  Prior Device Use: Indicate devices/aids used by the patient prior to current illness, exacerbation or injury? Walker  Current Functional Level Cognition  Orientation Level: Oriented X4    Extremity Assessment (includes Sensation/Coordination)  Upper  Extremity Assessment: Generalized weakness, RUE deficits/detail RUE Deficits / Details: recent clavicle fx with noted decr shoulder ROM with reaching tasks in standing 0-60 degrees  Lower Extremity Assessment: Defer to PT evaluation    ADLs  Overall ADL's : Needs assistance/impaired Eating/Feeding: Set up, Supervision/ safety, Sitting Eating/Feeding Details (indicate cue type and reason): in recliner Grooming: Set up, Sitting, Supervision/safety Grooming Details (indicate cue type and reason): EOB without spinal support Upper Body Bathing: Set up, Supervision/ safety, Sitting Upper Body Bathing Details (indicate cue type and reason): in recliner Lower Body Bathing: Maximal assistance Lower Body Bathing Details (indicate cue type and reason): Mod A +2 sit<>stand Upper Body Dressing : Moderate assistance, Sitting Upper Body Dressing Details (indicate cue type and reason): in recliner Lower Body Dressing: Maximal assistance Lower Body Dressing Details (indicate cue type and reason): Mod A +2 sit<>stand Toilet Transfer: Minimal assistance, Ambulation, Rolling walker (2 wheels), Contact guard assist Toilet Transfer Details (indicate cue type and reason): one handed A  from bed to recliner with +2 for safety/A Toileting- Clothing Manipulation and Hygiene: Maximal assistance Toileting - Clothing Manipulation Details (indicate cue type and reason): Mod A +2 sit<>stand Functional mobility during ADLs: Minimal assistance, Rolling walker (2 wheels)    Mobility  Overal bed mobility: Needs Assistance Bed Mobility: Supine to Sit Supine to sit: Min assist Sit to supine: Mod assist General bed mobility comments: min A to scoot hips out to EOB and for trunk    Transfers  Overall transfer level: Needs assistance Equipment used: Rolling walker (2 wheels) Transfers: Sit to/from Stand, Bed to chair/wheelchair/BSC Sit to Stand: Min assist, Contact guard assist Bed to/from chair/wheelchair/BSC transfer type:: Step pivot Step pivot transfers: Min assist General transfer comment: pt with tendency to pull up on RW; min A to boost, and maintain balance with step pivot    Ambulation / Gait / Stairs / Wheelchair Mobility  Ambulation/Gait Ambulation/Gait assistance: Editor, Commissioning (Feet): 20 Feet (+ 40') Assistive device: Rolling walker (2 wheels) Gait Pattern/deviations: Decreased step length - right, Decreased step length - left, Decreased stride length General Gait Details: pt taking slow small steps with flexed trunk, pt able to correct but unable to maintain Gait velocity: reduced Gait velocity interpretation: <1.31 ft/sec, indicative of household ambulator    Posture / Balance Dynamic Sitting Balance Sitting balance - Comments: right lateral lean, VCs to correct and pt unaware Balance Overall balance assessment: Needs assistance Sitting-balance support: Single extremity supported, Feet supported Sitting balance-Leahy Scale: Good Sitting balance - Comments: right lateral lean, VCs to correct and pt unaware Standing balance support: Bilateral upper extremity supported Standing balance-Leahy Scale: Poor Standing balance comment: reliant on external  support    Special considerations/life events  Fall precautions WOC consulted 12/2 due to sacrum Dr Watt placed double lumen latex 16 fr cath on 11/28 Left and right ureter 6 fr placed on 11/28    Previous Home Environment  Living Arrangements: Spouse/significant other  Lives With: Spouse Available Help at Discharge: Family, Available 24 hours/day Type of Home: House Home Layout: Two level, Able to live on main level with bedroom/bathroom Alternate Level Stairs-Number of Steps: flight Home Access: Stairs to enter Entrance Stairs-Rails: Left Entrance Stairs-Number of Steps: 3 Bathroom Shower/Tub: Psychologist, counselling, Sport And Exercise Psychologist: Standard Bathroom Accessibility: Yes How Accessible: Accessible via walker Home Care Services: Yes Type of Home Care Services: Home PT, Home OT (aide) Home Care Agency (if known): [facility] 10/25 Additional Comments: CIR admit 10/25, home at overall supervision  level after 13 days  Discharge Living Setting Plans for Discharge Living Setting: Patient's home, House, Lives with (comment) (wife) Type of Home at Discharge: House Discharge Home Layout: Two level, Able to live on main level with bedroom/bathroom Alternate Level Stairs-Number of Steps: flight Discharge Home Access: Stairs to enter Entrance Stairs-Rails: Left Entrance Stairs-Number of Steps: 3 Discharge Bathroom Shower/Tub: Walk-in shower, Door Discharge Bathroom Toilet: Standard Discharge Bathroom Accessibility: Yes How Accessible: Accessible via walker Does the patient have any problems obtaining your medications?: No  Social/Family/Support Systems Patient Roles: Spouse, Parent Contact Information: wife, Glenda Anticipated Caregiver: wife, daughter Anticipated Industrial/product Designer Information: see contacts Ability/Limitations of Caregiver: no limitations notied Caregiver Availability: 24/7 Discharge Plan Discussed with Primary Caregiver: Yes Is Caregiver In Agreement with Plan?:  Yes Does Caregiver/Family have Issues with Lodging/Transportation while Pt is in Rehab?: No  Goals Patient/Family Goal for Rehab: supervision PT, supervision to min OT Expected length of stay: ELOS 10 to 14 days Additional Information: CIR admit 10/25 for 13 days and went home at overall supervision level Pt/Family Agrees to Admission and willing to participate: Yes Program Orientation Provided & Reviewed with Pt/Caregiver Including Roles  & Responsibilities: Yes  Decrease burden of Care through IP rehab admission: n/a  Possible need for SNF placement upon discharge: not anticipated  Patient Condition: I have reviewed medical records from [facility], spoken with  patient, spouse, and daughter. I met with patient at the bedside for inpatient rehabilitation assessment.  Patient will benefit from ongoing PT and OT, can actively participate in 3 hours of therapy a day 5 days of the week, and can make measurable gains during the admission.  Patient will also benefit from the coordinated team approach during an Inpatient Acute Rehabilitation admission.  The patient will receive intensive therapy as well as Rehabilitation physician, nursing, social worker, and care management interventions.  Due to bladder management, bowel management, safety, skin/wound care, disease management, medication administration, pain management, and patient education the patient requires 24 hour a day rehabilitation nursing.  The patient is currently min assist overall with mobility and basic ADLs.  Discharge setting and therapy post discharge at home with home health is anticipated.  Patient has agreed to participate in the Acute Inpatient Rehabilitation Program and will admit today.  Preadmission Screen Completed By:  Alison Heron Lot, RN MSN 01/25/2024 11:06 AM ______________________________________________________________________   Discussed status with Dr. Emeline on 01/25/24 at 1106 and received approval for  admission today.  Admission Coordinator:  Alison Heron Lot, RN MSN time 1106 Date 01/25/24   Assessment/Plan: Diagnosis: Debility secondary to sepsis with obstructive ureteral stone Does the need for close, 24 hr/day Medical supervision in concert with the patient's rehab needs make it unreasonable for this patient to be served in a less intensive setting? Yes Co-Morbidities requiring supervision/potential complications: New onset heart failure with reduced ejection fraction, urinary obstruction with bilateral ureteral stents, urinary incontinence with Foley catheter, pyelonephritis and E. coli bacteremia on IV antibiotics, AKI, SVT, thrombocytopenia, moderate malnutrition, constipation, and sacral pressure injury Due to bladder management, bowel management, safety, skin/wound care, disease management, medication administration, pain management, and patient education, does the patient require 24 hr/day rehab nursing? Yes Does the patient require coordinated care of a physician, rehab nurse, PT, OT  to address physical and functional deficits in the context of the above medical diagnosis(es)? Yes Addressing deficits in the following areas: balance, endurance, locomotion, strength, transferring, bathing, dressing, feeding, grooming, and toileting Can the patient actively participate in  an intensive therapy program of at least 3 hrs of therapy 5 days a week? Yes The potential for patient to make measurable gains while on inpatient rehab is good Anticipated functional outcomes upon discharge from inpatient rehab: supervision PT, supervision OT Estimated rehab length of stay to reach the above functional goals is: 10-14 days Anticipated discharge destination: Home 10. Overall Rehab/Functional Prognosis: good   MD Signature:  Joesph JAYSON Likes, DO 01/25/2024

## 2024-01-24 NOTE — Progress Notes (Signed)
 PROGRESS NOTE    Jerry [name]  FMW:[id number] DOB: [birth year] DOA: 01/19/2024 PCP: [name] ORN, MD   Brief Narrative: Jerry [name] is a 89 y.o. male with a history of AAA, popliteal aneurysm s/p stent and DAPT, hyperlipidemia, hypertension, subdural hematoma, hyperlipidemia.  Patient presented secondary to a fall and delirium, found to have evidence of septic shock secondary to obstructive uropathy, in addition to pyelonephritis and bacteremia. Patient required ICU admission and intubation. Urology consulted and performed urgent bilateral ureteral stent placements. Symptoms improving with treatment.   Assessment and Plan:  Septic shock Present on admission. Secondary to acute pyelonephritis and obstructive uropathy with evidence of E. Coli bacteremia. Patient started empirically on Vancomycin  and Zosyn  initially and transitioned to Ceftriaxone  IV. Patient required ICU admission and was started on dobutamine , vasopressin , phenylephrine  and norepinephrine . Vasopressors weaned off with treatment of infection and ureteral obstruction.  Acute pyelonephritis E. Coli bacteremia Secondary to left ureter obstruction. Urine culture significant for E. Coli. Blood culture also with E. Coli. Empiric Vancomycin  and Zosyn  transitioned to Ceftriaxone  for management. -Continue Ceftriaxone  IV  Bilateral ureteral stones Left ureter obstruction Urology consulted and performed cystoscopy with insertion of bilateral ureteral stents on 11/29.  Acute metabolic encephalopathy Secondary to sepsis and acute infection. Resolved with treatment.  New onset HFrEF Unclear etiology, but patient does have a history of PAD, so ischemia is a possible etiology. Could also be related to sepsis.  Troponin elevated at 147 with downtrend to 125. Transthoracic Echocardiogram significant for an LVEF of 35-40% with global hypokinesis. Patient managed, shortly, with Lasix .  Lactic acidosis Lactic acid of 4.1  on admission. Resolved with management of sepsis.  AKI Baseline creatinine of around 0.9. Creatinine of 1.75 with peak of 2.16. Complicated by mild hydronephrosis from ureteral obstruction. Creatinine improved. AKI resolved.   Right upper extremity SVT Diagnosed on 12/3. -Supportive care  PAD Patient managed on DAPT for stent x2 at SFA/popliteal artery. Antiplatelets held on admission secondary to sepsis and thrombocytopenia.  Thrombocytopenia Likely secondary to acute infection. Platelets down to a low of 30,000. Improved to 53,000.  Cough Likely related to recent intubation. Clear production. -Tussionex at bedtime  History of subdural hematoma Noted. No evidence of hematoma on CT head obtained this admission.  AAA Noted.  Cholelithiasis Noted incidentally on CT imaging. Asymptomatic.  Bilateral rib fractures Noted. No evidence for acute fracture.  Aortic atherosclerosis Noted.  Moderate malnutrition -Dietitian recommendations (12/2):  Constipation -Continue MiraLAX  and Colace  Pressure injury Right/mid/left sacrum. Unclear if present on admission.   DVT prophylaxis: SCDs Code Status:   Code Status: Limited: Do not attempt resuscitation (DNR) -DNR-LIMITED -Do Not Intubate/DNI  Family Communication: Wife and two daughters at bedside Disposition Plan: Discharge possibly to acute inpatient rehabilitation likely in 2-4 days   Consultants:  PCCM Urology  Procedures:  Transthoracic Echocardiogram Cystoscopy with insertion of bilateral ureteral stents.   Antimicrobials: Vancomycin  Zosyn  Ceftriaxone     Subjective: Patient reports coughing overnight. Also concerned that he has not had a bowel movement. He reports some abdominal pain. No nausea or vomiting.  Objective: BP 126/78 (BP Location: Right Arm)   Pulse 95   Temp 97.6 F (36.4 C)   Resp 16   Ht 5' 5 (1.651 m)   Wt 73 kg   SpO2 96%   BMI 26.78 kg/m   Examination:  General exam: Appears  calm and comfortable. Respiratory system: Clear to auscultation. Respiratory effort normal. Cardiovascular system: S1 & S2 heard, RRR. Gastrointestinal  system: Abdomen is nondistended, soft and nontender. Normal bowel sounds heard. Central nervous system: Alert and oriented. No focal neurological deficits. Musculoskeletal: No edema. No calf tenderness Psychiatry: Judgement and insight appear normal. Mood & affect appropriate.    Data Reviewed: I have personally reviewed following labs and imaging studies  CBC Lab Results  Component Value Date   WBC 38.6 (H) 01/23/2024   RBC 3.37 (L) 01/23/2024   HGB 10.6 (L) 01/23/2024   HCT 31.8 (L) 01/23/2024   MCV 94.4 01/23/2024   MCH 31.5 01/23/2024   PLT 30 (L) 01/23/2024   MCHC 33.3 01/23/2024   RDW 15.0 01/23/2024   LYMPHSABS 0.1 (L) 01/19/2024   MONOABS 0.1 01/19/2024   EOSABS 0.0 01/19/2024   BASOSABS 0.0 01/19/2024     Last metabolic panel Lab Results  Component Value Date   NA 139 01/23/2024   K 4.1 01/23/2024   CL 107 01/23/2024   CO2 24 01/23/2024   BUN 42 (H) 01/23/2024   CREATININE 1.18 01/23/2024   GLUCOSE 121 (H) 01/23/2024   GFRNONAA 58 (L) 01/23/2024   GFRAA >60 10/17/2018   CALCIUM  8.0 (L) 01/23/2024   PHOS 2.5 01/23/2024   PROT 5.0 (L) 01/22/2024   ALBUMIN  2.5 (L) 01/22/2024   BILITOT 0.7 01/22/2024   ALKPHOS 168 (H) 01/22/2024   AST 80 (H) 01/22/2024   ALT 145 (H) 01/22/2024   ANIONGAP 8 01/23/2024    GFR: Estimated Creatinine Clearance: 34 mL/min (by C-G formula based on SCr of 1.18 mg/dL).  Recent Results (from the past 240 hours)  Urine Culture     Status: Abnormal   Collection Time: 01/19/24  7:34 PM   Specimen: Urine, Random  Result Value Ref Range Status   Specimen Description URINE, RANDOM  Final   Special Requests   Final    NONE Reflexed from [id number] Performed at [facility], [address], [place], [place] [address]    Culture >=100,000 COLONIES/mL ESCHERICHIA COLI (A)  Final    Report Status 01/21/2024 FINAL  Final   Organism ID, Bacteria ESCHERICHIA COLI (A)  Final      Susceptibility   Escherichia coli - MIC*    AMPICILLIN 8 SENSITIVE Sensitive     CEFAZOLIN  (URINE) Value in next row Sensitive      2 SENSITIVEThis is a modified FDA-approved test that has been validated and its performance characteristics determined by the reporting laboratory.  This laboratory is certified under the Clinical Laboratory Improvement Amendments CLIA as qualified to perform high complexity clinical laboratory testing.    CEFEPIME Value in next row Sensitive      2 SENSITIVEThis is a modified FDA-approved test that has been validated and its performance characteristics determined by the reporting laboratory.  This laboratory is certified under the Clinical Laboratory Improvement Amendments CLIA as qualified to perform high complexity clinical laboratory testing.    ERTAPENEM Value in next row Sensitive      2 SENSITIVEThis is a modified FDA-approved test that has been validated and its performance characteristics determined by the reporting laboratory.  This laboratory is certified under the Clinical Laboratory Improvement Amendments CLIA as qualified to perform high complexity clinical laboratory testing.    CEFTRIAXONE  Value in next row Sensitive      2 SENSITIVEThis is a modified FDA-approved test that has been validated and its performance characteristics determined by the reporting laboratory.  This laboratory is certified under the Clinical Laboratory Improvement Amendments CLIA as qualified to perform high complexity clinical  laboratory testing.    CIPROFLOXACIN Value in next row Sensitive      2 SENSITIVEThis is a modified FDA-approved test that has been validated and its performance characteristics determined by the reporting laboratory.  This laboratory is certified under the Clinical Laboratory Improvement Amendments CLIA as qualified to perform high complexity clinical laboratory  testing.    GENTAMICIN Value in next row Sensitive      2 SENSITIVEThis is a modified FDA-approved test that has been validated and its performance characteristics determined by the reporting laboratory.  This laboratory is certified under the Clinical Laboratory Improvement Amendments CLIA as qualified to perform high complexity clinical laboratory testing.    NITROFURANTOIN Value in next row Sensitive      2 SENSITIVEThis is a modified FDA-approved test that has been validated and its performance characteristics determined by the reporting laboratory.  This laboratory is certified under the Clinical Laboratory Improvement Amendments CLIA as qualified to perform high complexity clinical laboratory testing.    TRIMETH/SULFA Value in next row Sensitive      2 SENSITIVEThis is a modified FDA-approved test that has been validated and its performance characteristics determined by the reporting laboratory.  This laboratory is certified under the Clinical Laboratory Improvement Amendments CLIA as qualified to perform high complexity clinical laboratory testing.    AMPICILLIN/SULBACTAM Value in next row Sensitive      2 SENSITIVEThis is a modified FDA-approved test that has been validated and its performance characteristics determined by the reporting laboratory.  This laboratory is certified under the Clinical Laboratory Improvement Amendments CLIA as qualified to perform high complexity clinical laboratory testing.    PIP/TAZO Value in next row Sensitive      <=4 SENSITIVEThis is a modified FDA-approved test that has been validated and its performance characteristics determined by the reporting laboratory.  This laboratory is certified under the Clinical Laboratory Improvement Amendments CLIA as qualified to perform high complexity clinical laboratory testing.    MEROPENEM Value in next row Sensitive      <=4 SENSITIVEThis is a modified FDA-approved test that has been validated and its performance  characteristics determined by the reporting laboratory.  This laboratory is certified under the Clinical Laboratory Improvement Amendments CLIA as qualified to perform high complexity clinical laboratory testing.    * >=100,000 COLONIES/mL ESCHERICHIA COLI  Blood Culture (routine x 2)     Status: Abnormal   Collection Time: 01/19/24  7:44 PM   Specimen: BLOOD  Result Value Ref Range Status   Specimen Description BLOOD SITE NOT SPECIFIED  Final   Special Requests   Final    BOTTLES DRAWN AEROBIC AND ANAEROBIC Blood Culture adequate volume   Culture  Setup Time   Final    GRAM NEGATIVE RODS IN BOTH AEROBIC AND ANAEROBIC BOTTLES CRITICAL RESULT CALLED TO, READ BACK BY AND VERIFIED WITH: PHARMD J LEDFORD 01/20/2024 @ 0622 BY AB Performed at [facility], [address], [place], [place] [address]    Culture ESCHERICHIA COLI (A)  Final   Report Status 01/22/2024 FINAL  Final   Organism ID, Bacteria ESCHERICHIA COLI  Final      Susceptibility   Escherichia coli - MIC*    AMPICILLIN 8 SENSITIVE Sensitive     CEFAZOLIN  (NON-URINE) 4 INTERMEDIATE Intermediate     CEFEPIME <=0.12 SENSITIVE Sensitive     ERTAPENEM <=0.12 SENSITIVE Sensitive     CEFTRIAXONE <=0.25 SENSITIVE Sensitive     CIPROFLOXACIN <=0.06 SENSITIVE Sensitive     GENTAMICIN <=1  SENSITIVE Sensitive     MEROPENEM <=0.25 SENSITIVE Sensitive     TRIMETH/SULFA <=20 SENSITIVE Sensitive     AMPICILLIN/SULBACTAM <=2 SENSITIVE Sensitive     PIP/TAZO Value in next row Sensitive      <=4 SENSITIVEThis is a modified FDA-approved test that has been validated and its performance characteristics determined by the reporting laboratory.  This laboratory is certified under the Clinical Laboratory Improvement Amendments CLIA as qualified to perform high complexity clinical laboratory testing.    * ESCHERICHIA COLI  Blood Culture ID Panel (Reflexed)     Status: Abnormal   Collection Time: 01/19/24  7:44 PM  Result Value Ref Range Status    Enterococcus faecalis NOT DETECTED NOT DETECTED Final   Enterococcus Faecium NOT DETECTED NOT DETECTED Final   Listeria monocytogenes NOT DETECTED NOT DETECTED Final   Staphylococcus species NOT DETECTED NOT DETECTED Final   Staphylococcus aureus (BCID) NOT DETECTED NOT DETECTED Final   Staphylococcus epidermidis NOT DETECTED NOT DETECTED Final   Staphylococcus lugdunensis NOT DETECTED NOT DETECTED Final   Streptococcus species NOT DETECTED NOT DETECTED Final   Streptococcus agalactiae NOT DETECTED NOT DETECTED Final   Streptococcus pneumoniae NOT DETECTED NOT DETECTED Final   Streptococcus pyogenes NOT DETECTED NOT DETECTED Final   A.calcoaceticus-baumannii NOT DETECTED NOT DETECTED Final   Bacteroides fragilis NOT DETECTED NOT DETECTED Final   Enterobacterales DETECTED (A) NOT DETECTED Final    Comment: Enterobacterales represent a large order of gram negative bacteria, not a single organism. CRITICAL RESULT CALLED TO, READ BACK BY AND VERIFIED WITH: PHARMD J LEDFORD 01/20/2024 @ 0622 BY AB    Enterobacter cloacae complex NOT DETECTED NOT DETECTED Final   Escherichia coli DETECTED (A) NOT DETECTED Final    Comment: CRITICAL RESULT CALLED TO, READ BACK BY AND VERIFIED WITH: PHARMD J LEDFORD 01/20/2024 @ 0622 BY AB    Klebsiella aerogenes NOT DETECTED NOT DETECTED Final   Klebsiella oxytoca NOT DETECTED NOT DETECTED Final   Klebsiella pneumoniae NOT DETECTED NOT DETECTED Final   Proteus species NOT DETECTED NOT DETECTED Final   Salmonella species NOT DETECTED NOT DETECTED Final   Serratia marcescens NOT DETECTED NOT DETECTED Final   Haemophilus influenzae NOT DETECTED NOT DETECTED Final   Neisseria meningitidis NOT DETECTED NOT DETECTED Final   Pseudomonas aeruginosa NOT DETECTED NOT DETECTED Final   Stenotrophomonas maltophilia NOT DETECTED NOT DETECTED Final   Candida albicans NOT DETECTED NOT DETECTED Final   Candida auris NOT DETECTED NOT DETECTED Final   Candida glabrata  NOT DETECTED NOT DETECTED Final   Candida krusei NOT DETECTED NOT DETECTED Final   Candida parapsilosis NOT DETECTED NOT DETECTED Final   Candida tropicalis NOT DETECTED NOT DETECTED Final   Cryptococcus neoformans/gattii NOT DETECTED NOT DETECTED Final   CTX-M ESBL NOT DETECTED NOT DETECTED Final   Carbapenem resistance IMP NOT DETECTED NOT DETECTED Final   Carbapenem resistance KPC NOT DETECTED NOT DETECTED Final   Carbapenem resistance NDM NOT DETECTED NOT DETECTED Final   Carbapenem resist OXA 48 LIKE NOT DETECTED NOT DETECTED Final   Carbapenem resistance VIM NOT DETECTED NOT DETECTED Final    Comment: Performed at [facility], [address] [place], [place] [address]  Blood Culture (routine x 2)     Status: Abnormal   Collection Time: 01/19/24  8:26 PM   Specimen: BLOOD  Result Value Ref Range Status   Specimen Description BLOOD SITE NOT SPECIFIED  Final   Special Requests   Final  BOTTLES DRAWN AEROBIC AND ANAEROBIC Blood Culture adequate volume   Culture  Setup Time   Final    GRAM NEGATIVE RODS IN BOTH AEROBIC AND ANAEROBIC BOTTLES CRITICAL VALUE NOTED.  VALUE IS CONSISTENT WITH PREVIOUSLY REPORTED AND CALLED VALUE.    Culture (A)  Final    ESCHERICHIA COLI SUSCEPTIBILITIES PERFORMED ON PREVIOUS CULTURE WITHIN THE LAST 5 DAYS. Performed at [facility], [address] [place], [place] [address]    Report Status 01/22/2024 FINAL  Final  Resp panel by RT-PCR (RSV, Flu A&B, Covid) Anterior Nasal Swab     Status: None   Collection Time: 01/19/24  8:29 PM   Specimen: Anterior Nasal Swab  Result Value Ref Range Status   SARS Coronavirus 2 by RT PCR NEGATIVE NEGATIVE Final   Influenza A by PCR NEGATIVE NEGATIVE Final   Influenza B by PCR NEGATIVE NEGATIVE Final    Comment: (NOTE) The Xpert Xpress SARS-CoV-2/FLU/RSV plus assay is intended as an aid in the diagnosis of influenza from Nasopharyngeal swab specimens and should not be used as a sole basis for  treatment. Nasal washings and aspirates are unacceptable for Xpert Xpress SARS-CoV-2/FLU/RSV testing.  Fact Sheet for Patients: bloggercourse.com  Fact Sheet for Healthcare Providers: seriousbroker.it  This test is not yet approved or cleared by the [place]  FDA and has been authorized for detection and/or diagnosis of SARS-CoV-2 by FDA under an Emergency Use Authorization (EUA). This EUA will remain in effect (meaning this test can be used) for the duration of the COVID-19 declaration under Section 564(b)(1) of the Act, 21 U.S.C. section 360bbb-3(b)(1), unless the authorization is terminated or revoked.     Resp Syncytial Virus by PCR NEGATIVE NEGATIVE Final    Comment: (NOTE) Fact Sheet for Patients: bloggercourse.com  Fact Sheet for Healthcare Providers: seriousbroker.it  This test is not yet approved or cleared by the [place]  FDA and has been authorized for detection and/or diagnosis of SARS-CoV-2 by FDA under an Emergency Use Authorization (EUA). This EUA will remain in effect (meaning this test can be used) for the duration of the COVID-19 declaration under Section 564(b)(1) of the Act, 21 U.S.C. section 360bbb-3(b)(1), unless the authorization is terminated or revoked.  Performed at [facility], [address] [place], [place] [address]   MRSA Next Gen by PCR, Nasal     Status: None   Collection Time: 01/20/24 12:32 AM   Specimen: Nasal Mucosa; Nasal Swab  Result Value Ref Range Status   MRSA by PCR Next Gen NOT DETECTED NOT DETECTED Final    Comment: (NOTE) The GeneXpert MRSA Assay (FDA approved for NASAL specimens only), is one component of a comprehensive MRSA colonization surveillance program. It is not intended to diagnose MRSA infection nor to guide or monitor treatment for MRSA infections. Test performance is not FDA approved in patients  less than 61 years old. Performed at [facility], [address] [place], [place] [address]       Radiology Studies: No results found.    LOS: 5 days    Elgin Lam, MD [facility] 01/24/2024, 7:58 AM   If 7PM-7AM, please contact night-coverage [email or web address]
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
## 2024-01-24 NOTE — Hospital Course (Addendum)
 Jerry [name] is a 89 y.o. male with a history of AAA, popliteal aneurysm s/p stent and DAPT, hyperlipidemia, hypertension, subdural hematoma, hyperlipidemia.  Patient presented secondary to a fall and delirium, found to have evidence of septic shock secondary to obstructive uropathy, in addition to pyelonephritis and bacteremia. Patient required ICU admission and intubation. Urology consulted and performed urgent bilateral ureteral stent placements. Symptoms improving with treatment. Hospitalization further complicated by development of atrial fibrillation with RVR. Cardiology consulted and medications adjusted for better control. Patient converted to atrial flutter and finally to sinus rhythm prior to discharge.

## 2024-01-24 NOTE — Progress Notes (Signed)
 5 Days Post-Op Subjective: Pt up in chair, accompanied by wife Jerry [name].  Urine clearing with old blood products in tubing.   Objective: Vital signs in last 24 hours: Temp:  [97.6 F (36.4 C)-98 F (36.7 C)] 97.8 F (36.6 C) (12/03 1458) Pulse Rate:  [72-106] 106 (12/03 1458) Resp:  [16-20] 16 (12/03 1458) BP: (95-140)/(58-79) 129/74 (12/03 1458) SpO2:  [87 %-98 %] 97 % (12/03 1458)  Assessment/Plan: #bilateral ureteral stones #Sepsis- resolved #gross hematuria  Pan sensitive E. Coli  UTI and bacteremia. Continue Ceftriaxone S/p bilateral ureteral stent placement with Dr. Watt 11/29. Ureteroscopy/definitive stone mgmt on an outpt basis Plavix  washout. Urine clearing with only old blood products  Intake/Output from previous day: 12/02 0701 - 12/03 0700 In: 751.2 [P.O.:480; I.V.:10.9; IV Piggyback:260.4] Out: 925 [Urine:925]  Intake/Output this shift: No intake/output data recorded.  Physical Exam:  General: Alert and oriented CV: No cyanosis Lungs: equal chest rise Abdomen: Soft, NTND, no rebound or guarding Gu: foley in place with clear tan urine, some old blod products intubing  Lab Results: Recent Labs    01/22/24 0341 01/23/24 0327 01/24/24 0822  HGB 11.1* 10.6* 11.5*  HCT 33.3* 31.8* 35.0*   BMET Recent Labs    01/23/24 0327 01/24/24 0822  NA 139 140  K 4.1 4.2  CL 107 110  CO2 24 24  GLUCOSE 121* 102*  BUN 42* 27*  CREATININE 1.18 0.89  CALCIUM  8.0* 8.2*  HGB 10.6* 11.5*  WBC 38.6* 26.6*     Studies/Results: VAS US  LOWER EXTREMITY VENOUS (DVT) Result Date: 01/24/2024  Lower Venous DVT Study Patient Name:  Jerry [name]  Date of Exam:   01/24/2024 Medical Rec #: [id number]         Accession #:    [id number] Date of Birth: November 21, 1928         Patient Gender: M Patient Age:   89 years Exam Location:  [facility] Procedure:      VAS US  LOWER EXTREMITY VENOUS (DVT) Referring Phys: ABDULLAHI HUSSEIN  --------------------------------------------------------------------------------  Indications: Swelling, and Edema.  Comparison Study: No prior exam. Performing Technologist: Edilia Elden Appl  Examination Guidelines: A complete evaluation includes B-mode imaging, spectral Doppler, color Doppler, and power Doppler as needed of all accessible portions of each vessel. Bilateral testing is considered an integral part of a complete examination. Limited examinations for reoccurring indications may be performed as noted. The reflux portion of the exam is performed with the patient in reverse Trendelenburg.  +---------+---------------+---------+-----------+----------+--------------+ RIGHT    CompressibilityPhasicitySpontaneityPropertiesThrombus Aging +---------+---------------+---------+-----------+----------+--------------+ CFV      Full           Yes      Yes                                 +---------+---------------+---------+-----------+----------+--------------+ SFJ      Full           Yes      Yes                                 +---------+---------------+---------+-----------+----------+--------------+ FV Prox  Full                                                        +---------+---------------+---------+-----------+----------+--------------+  FV Mid   Full                                                        +---------+---------------+---------+-----------+----------+--------------+ FV DistalFull                                                        +---------+---------------+---------+-----------+----------+--------------+ PFV      Full                                                        +---------+---------------+---------+-----------+----------+--------------+ POP      Full           Yes      Yes                                 +---------+---------------+---------+-----------+----------+--------------+ PTV      Full                                                         +---------+---------------+---------+-----------+----------+--------------+ PERO     Full                                                        +---------+---------------+---------+-----------+----------+--------------+   +----+---------------+---------+-----------+----------+--------------+ LEFTCompressibilityPhasicitySpontaneityPropertiesThrombus Aging +----+---------------+---------+-----------+----------+--------------+ CFV Full           Yes      Yes                                 +----+---------------+---------+-----------+----------+--------------+ SFJ Full           Yes      Yes                                 +----+---------------+---------+-----------+----------+--------------+     Summary: RIGHT: - There is no evidence of deep vein thrombosis in the lower extremity.  - No cystic structure found in the popliteal fossa.  LEFT: - No evidence of common femoral vein obstruction.   *See table(s) above for measurements and observations.    Preliminary    VAS US  UPPER EXTREMITY VENOUS DUPLEX Result Date: 01/24/2024 UPPER VENOUS STUDY  Patient Name:  Jerry [name]  Date of Exam:   01/24/2024 Medical Rec #: [id number]         Accession #:    [id number] Date of Birth: 07/09/1928         Patient Gender: M Patient Age:   72 years Exam [facility] Procedure:      VAS US  UPPER EXTREMITY VENOUS DUPLEX Referring Phys: ABDULLAHI HUSSEIN --------------------------------------------------------------------------------  Indications: swollen, rule out DVT. Comparison Study: No prior exam. Performing Technologist: Edilia Elden Appl  Examination Guidelines: A complete evaluation includes B-mode imaging, spectral Doppler, color Doppler, and power Doppler as needed of all accessible portions of each vessel. Bilateral testing is considered an integral part of a complete examination. Limited examinations for reoccurring indications may be performed as  noted.  Right Findings: +----------+------------+---------+-----------+----------+-------+ RIGHT     CompressiblePhasicitySpontaneousPropertiesSummary +----------+------------+---------+-----------+----------+-------+ IJV           Full       Yes       Yes                      +----------+------------+---------+-----------+----------+-------+ Subclavian    Full       Yes       Yes                      +----------+------------+---------+-----------+----------+-------+ Axillary      Full       Yes       Yes                      +----------+------------+---------+-----------+----------+-------+ Brachial      Full       Yes       Yes                      +----------+------------+---------+-----------+----------+-------+ Radial        Full                                          +----------+------------+---------+-----------+----------+-------+ Ulnar         Full                                          +----------+------------+---------+-----------+----------+-------+ Cephalic      None       No        No                       +----------+------------+---------+-----------+----------+-------+ Basilic       Full       Yes       Yes                      +----------+------------+---------+-----------+----------+-------+ Superficial vein thrombosis noted in the right cephalic vein from the shoulder to the antecubital fossa. All segments in the forearm are compressible.  Left Findings: +----------+------------+---------+-----------+----------+-------+ LEFT      CompressiblePhasicitySpontaneousPropertiesSummary +----------+------------+---------+-----------+----------+-------+ IJV           Full       Yes       Yes                      +----------+------------+---------+-----------+----------+-------+ Subclavian    Full       Yes       Yes                      +----------+------------+---------+-----------+----------+-------+  Summary:   Right: No evidence of deep vein thrombosis in the upper extremity. Findings consistent  with acute superficial vein thrombosis involving the right cephalic vein.  Left: No evidence of thrombosis in the subclavian.  *See table(s) above for measurements and observations.  Diagnosing physician: Debby Robertson Electronically signed by Debby Robertson on 01/24/2024 at 4:08:52 PM.    Final       LOS: 5 days   Ole Bourdon, NP [facility] Pager: [phone]  01/24/2024, 7:16 PM

## 2024-01-24 NOTE — Progress Notes (Signed)
 Mobility Specialist Progress Note:    01/24/24 1318  Mobility  Activity Ambulated with assistance (In room)  Level of Assistance Minimal assist, patient does 75% or more  Assistive Device Front wheel walker  Distance Ambulated (ft) 25 ft  RUE Weight Bearing Per Provider Order WBAT  Activity Response Tolerated well  Mobility Referral Yes  Mobility visit 1 Mobility  Mobility Specialist Start Time (ACUTE ONLY) 1305  Mobility Specialist Stop Time (ACUTE ONLY) 1317  Mobility Specialist Time Calculation (min) (ACUTE ONLY) 12 min   Received pt in chair and agreeable to mobility. Pt required light MinA STS, otherwise MinG for safety. Pt c/o abdominal discomfort, otherwise tolerated well. Left pt in bed with alarm on. Personal belongings and call light within reach. All needs met. RN present.  Lavanda Pollack Mobility Specialist  Please contact via [employer] or  Rehab Office [phone]

## 2024-01-25 DIAGNOSIS — I5041 Acute combined systolic (congestive) and diastolic (congestive) heart failure: Secondary | ICD-10-CM | POA: Insufficient documentation

## 2024-01-25 DIAGNOSIS — I4891 Unspecified atrial fibrillation: Secondary | ICD-10-CM

## 2024-01-25 DIAGNOSIS — R6521 Severe sepsis with septic shock: Secondary | ICD-10-CM

## 2024-01-25 DIAGNOSIS — R7989 Other specified abnormal findings of blood chemistry: Secondary | ICD-10-CM

## 2024-01-25 DIAGNOSIS — I4510 Unspecified right bundle-branch block: Secondary | ICD-10-CM | POA: Insufficient documentation

## 2024-01-25 DIAGNOSIS — R7881 Bacteremia: Secondary | ICD-10-CM

## 2024-01-25 DIAGNOSIS — I10 Essential (primary) hypertension: Secondary | ICD-10-CM | POA: Diagnosis not present

## 2024-01-25 DIAGNOSIS — I5043 Acute on chronic combined systolic (congestive) and diastolic (congestive) heart failure: Secondary | ICD-10-CM | POA: Insufficient documentation

## 2024-01-25 DIAGNOSIS — B9620 Unspecified Escherichia coli [E. coli] as the cause of diseases classified elsewhere: Secondary | ICD-10-CM

## 2024-01-25 DIAGNOSIS — I420 Dilated cardiomyopathy: Secondary | ICD-10-CM | POA: Insufficient documentation

## 2024-01-25 LAB — CBC
HCT: 34 % — ABNORMAL LOW (ref 39.0–52.0)
Hemoglobin: 11 g/dL — ABNORMAL LOW (ref 13.0–17.0)
MCH: 30.6 pg (ref 26.0–34.0)
MCHC: 32.4 g/dL (ref 30.0–36.0)
MCV: 94.4 fL (ref 80.0–100.0)
Platelets: 79 K/uL — ABNORMAL LOW (ref 150–400)
RBC: 3.6 MIL/uL — ABNORMAL LOW (ref 4.22–5.81)
RDW: 15.4 % (ref 11.5–15.5)
WBC: 21.5 K/uL — ABNORMAL HIGH (ref 4.0–10.5)
nRBC: 0.2 % (ref 0.0–0.2)

## 2024-01-25 MED ORDER — FUROSEMIDE 10 MG/ML IJ SOLN
40.0000 mg | Freq: Every day | INTRAMUSCULAR | Status: DC
  Administered 2024-01-25 – 2024-01-28 (×4): 40 mg via INTRAVENOUS
  Filled 2024-01-25 (×4): qty 4

## 2024-01-25 MED ORDER — TAMSULOSIN HCL 0.4 MG PO CAPS
0.4000 mg | ORAL_CAPSULE | Freq: Every day | ORAL | Status: DC
  Administered 2024-01-25 – 2024-01-29 (×5): 0.4 mg via ORAL
  Filled 2024-01-25 (×5): qty 1

## 2024-01-25 MED ORDER — IPRATROPIUM-ALBUTEROL 0.5-2.5 (3) MG/3ML IN SOLN: 3.0000 mL | RESPIRATORY_TRACT | Status: DC | PRN

## 2024-01-25 MED ORDER — ENSURE MAX PROTEIN PO LIQD
11.0000 [oz_av] | Freq: Every day | ORAL | Status: DC
  Administered 2024-01-27 – 2024-01-29 (×3): 11 [oz_av] via ORAL

## 2024-01-25 MED ORDER — METOPROLOL SUCCINATE ER 25 MG PO TB24
25.0000 mg | ORAL_TABLET | Freq: Every day | ORAL | Status: DC
  Administered 2024-01-25: 25 mg via ORAL
  Filled 2024-01-25: qty 1

## 2024-01-25 NOTE — Progress Notes (Signed)
   Inpatient Rehabilitation Admissions Coordinator   Await cardiology follow up per Dr Briana to clear discharge to CIR today.  Heron Leavell, RN, MSN Rehab Admissions Coordinator [phone] 01/25/2024 1:39 PM

## 2024-01-25 NOTE — H&P (Signed)
 Physical Medicine and Rehabilitation Admission H&P    Chief Complaint  Patient presents with   Fall   Altered Mental Status  : HPI: Jerry [name] is a 89 year old right-handed male with history significant for AAA, anemia of chronic disease hearing loss, hypertension, hyperlipidemia, macular degeneration, popliteal aneurysm status post stenting by vascular surgery 2024 maintained on aspirin  and Plavix  per Dr. Selinda Gu, traumatic subdural hematoma after a fall as well as nondisplaced right clavicle fracture and received CIR 12/05/2023 - 12/19/2023 and was discharged to home with family requiring supervision for bed mobility and ambulates household distances with a rollator and arrangements were made for home health therapies.  Per chart review patient lives with spouse.  Two-level home bed and bath main level with 3 steps to from entry of home.  Wife does assist with some basic ADLs.  Presented 01/19/2024 after being found down after hearing a loud noise in the bathroom patient noted to have altered mental status.  In the ED patient was tachycardic as well as hypotensive with fever of 103 cranial CT scan showed no acute intracranial abnormality.  No skull fracture.  No mass effect or midline shift.  CT cervical spine negative.  CT of the chest abdomen and pelvis showed 2 mm calculus in the mid left ureter with mild obstructive uropathy.  Multiple healing/healed bilateral rib fractures.  There was question acute nondisplaced anterior right second rib fracture.  Age-indeterminate posterior left 10th rib fracture.  Stable abdominal aortic aneurysm measuring 4 cm.  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  Admission chemistries unremarkable except CO2 16 glucose 120, creatinine 1.75, AST 54, WBC 14,900, hemoglobin 12.5, platelets 103,000, blood cultures E. coli, BNP 292.3, lactic acid 5.1-7.0, troponin elevated 147-125.  Patient was placed on broad-spectrum  antibiotic with vancomycin  and Zosyn  for sepsis as well as receiving 2 L LR boluses, DuoNeb and Solu-Medrol .  Urology Dr. Norleen Seltzer consulted in regards to bilateral ureteral stones with left obstruction undergoing cystoscopy with bilateral retrograde pyelography and bilateral ureteral stents 01/20/2024.  Hospital course septic shock secondary to acute pyelonephritis and obstructive uropathy with evidence of E. coli bacteremia.  Patient initially required ICU admission was started on dobutamine , vasopressin , phenylephrine  and norepinephrine .  Vasopressors weaned off with treatment of infection.  His empiric vancomycin  and Zosyn  transition to ceftriaxone  with leukocytosis initially peaked to 43,600 improved to 26,600-21,500.  His Foley tube remains in place mild hematuria and monitored with hemoglobin stable 11.5.  New onset HFrEF question etiology felt troponin positive related to demand ischemia/sepsis.  Transthoracic echocardiogram significant for LVEF of 35 to 40% with global hypokinesis.  Patient did receive diuresis.  Lactic acidosis resolved with management of sepsis.  AKI complicated by mild hydronephrosis from ureteral obstruction improved with latest creatinine 0.89.  Incidental findings of right upper extremity SVT diagnosed 12/3 with supportive care.  Patient developed new onset atrial fibrillation 01/25/2024 with cardiology services follow-up he was placed on Toprol -XL 25 mg daily.  No anticoagulation at this time contraindicated due to recent subdural bleed.  In regards to patient's history of PAD managed on DAPT for stent x 2 SFA/popliteal artery antiplatelets held initially secondary to sepsis and thrombocytopenia with latest platelet count 175,000 and Plavix  and aspirin  have been resumed as of 01/27/2024.  Pressure injury of right/mid/left sacrum with wound care as directed.  He is tolerating a regular consistency diet.  Therapy evaluations completed due to patient's decreased functional  ability/septic shock was admitted  for a comprehensive rehab program.  Review of Systems  Constitutional:  Positive for fever and malaise/fatigue.  HENT:  Positive for hearing loss.   Eyes:  Positive for blurred vision.  Respiratory:  Negative for cough, shortness of breath and wheezing.   Cardiovascular:  Negative for chest pain, palpitations and leg swelling.  Gastrointestinal:  Positive for constipation. Negative for heartburn, nausea and vomiting.  Genitourinary:  Positive for hematuria and urgency. Negative for dysuria and flank pain.  Musculoskeletal:  Positive for falls, joint pain and myalgias.  Neurological:  Positive for weakness.  All other systems reviewed and are negative.  Past Medical History:  Diagnosis Date   AAA (abdominal aortic aneurysm)    Aneurysm    History of multiple vascular aneurysms. [name] involve the aorta, te iliac arteries, and the popliteal arteries.  The pt is s/p stent graft of an abdominal aortic aneurysm. All of his aneurysm follow up as been done at [facility].   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 This is manifested and is elevated, sed rate, pleuritis, Raynaud's pehnomenon and she does have a positive rheumatoid factor.  He is being treated with Plquenil for his collagen vascular disease   Colonic polyp    History of nephrolithiasis    Hyperlipidemia    Hypertension    Macular degeneration    Osteoarthrosis, hip    Left hip   Paget's disease    Past Surgical History:  Procedure Laterality Date   ABDOMINAL AORTIC ANEURYSM REPAIR     CYSTOSCOPY W/ URETERAL STENT PLACEMENT Bilateral 01/19/2024   Procedure: CYSTOSCOPY, WITH RETROGRADE PYELOGRAM AND URETERAL STENT INSERTION;  Surgeon: Watt Rush, MD;  Location: [facility] OR;  Service: Urology;  Laterality: Bilateral;  BILATERAL PYELOGRAM, POSSIBLE RIGHT URETERAL STENT PLACEMENT   DOPPLER ECHOCARDIOGRAPHY  2006   Ef greater than 60%. There are no regional wall motion  abnormalities. There was mild mitral regurgitation, there is no aortic stenosis, aortic valve was mildly calcified   HERNIA REPAIR  1998   LOWER EXTREMITY ANGIOGRAPHY Left 01/30/2023   Procedure: Lower Extremity Angiography;  Surgeon: Marea Selinda RAMAN, MD;  Location: [facility] INVASIVE CV LAB;  Service: Cardiovascular;  Laterality: Left;   Lung Hamartoma     Family History  Problem Relation Age of Onset   Heart attack Mother 75       MI   Alzheimer's disease Father    Social History:  reports that he has quit smoking. He has never used smokeless tobacco. He reports current alcohol use. No history on file for drug use. Allergies:  Allergies  Allergen Reactions   Ivp Dye [Iodinated Contrast Media]    Simvastatin     REACTION: joint pains   Soap Itching    fragrant soaps   Medications Prior to Admission  Medication Sig Dispense Refill   acetaminophen  (TYLENOL ) 500 MG tablet Take 2 tablets (1,000 mg total) by mouth every 6 (six) hours as needed. 30 tablet 0   aspirin  EC 81 MG tablet Take 1 tablet (81 mg total) by mouth daily. Swallow whole.     atorvastatin  (LIPITOR) 40 MG tablet Take 1 tablet (40 mg total) by mouth daily. 30 tablet 0   clopidogrel  (PLAVIX ) 75 MG tablet Take 1 tablet (75 mg total) by mouth daily. 30 tablet 0   diltiazem  (CARDIZEM  CD) 240 MG 24 hr capsule Take 1 capsule (240 mg total) by mouth daily. 30 capsule 0   Multiple Vitamin (MULTIVITAMIN PO) Take 1  tablet by mouth daily.     Multiple Vitamins-Minerals (PRESERVISION AREDS PO) Take 1 capsule by mouth 2 (two) times daily.     tamsulosin  (FLOMAX ) 0.4 MG CAPS capsule Take 1 capsule (0.4 mg total) by mouth daily after supper. 30 capsule 0   traZODone  (DESYREL ) 50 MG tablet Take 1 tablet (50 mg total) by mouth at bedtime. (Patient taking differently: Take 25 mg by mouth at bedtime.) 30 tablet 0   diclofenac  Sodium (VOLTAREN ) 1 % GEL Apply 2 g topically 4 (four) times daily. (Patient not taking: Reported on 01/21/2024) 100 g 0    lidocaine  (LIDODERM ) 5 % Place 2 patches onto the skin daily. Remove & Discard patch within 12 hours or as directed by MD (Patient not taking: Reported on 01/21/2024) 30 patch 0   oxyCODONE  (OXY IR/ROXICODONE ) 5 MG immediate release tablet Take 1 tablet (5 mg total) by mouth every 4 (four) hours as needed for moderate pain (pain score 4-6) or breakthrough pain. (Patient not taking: Reported on 01/21/2024) 30 tablet 0   polyethylene glycol (MIRALAX  / GLYCOLAX ) 17 g packet Take 17 g by mouth daily as needed for moderate constipation. (Patient not taking: Reported on 01/21/2024)     senna-docusate (SENOKOT-S) 8.6-50 MG tablet Take 1 tablet by mouth at bedtime. (Patient not taking: Reported on 01/21/2024)     topiramate  (TOPAMAX ) 25 MG tablet Take 1 tablet (25 mg total) by mouth 2 (two) times daily as needed (headache). (Patient not taking: Reported on 01/21/2024) 30 tablet 0      Home: Home Living Family/patient expects to be discharged to:: Private residence Living Arrangements: Spouse/significant other Available Help at Discharge: Family, Available 24 hours/day Type of Home: House Home Access: Stairs to enter [employer] of Steps: 3 Entrance Stairs-Rails: Left Home Layout: Two level, Able to live on main level with bedroom/bathroom Alternate Level Stairs-Number of Steps: flight Bathroom Shower/Tub: Psychologist, counselling, Door [employer] Toilet: Standard Bathroom Accessibility: Yes Home Equipment: Grab bars - tub/shower, Agricultural Consultant (2 wheels), Toilet riser, Other (comment), Wheelchair - manual, Rollator (4 wheels), Shower seat, Hand held shower head (hurrycane; urinal; bed rails) Additional Comments: CIR admit 10/25, home at overall supervision level after 13 days  Lives With: Spouse   Functional History: Prior Function Prior Level of Function : Independent/Modified Independent Mobility Comments: Once returned from [facility] recently he was mod I using rollator; supervision on  stairs ADLs Comments: Since discharge from [facility], wife has been helping with dressing upper and lower half, but pt has been mod I bathing; has not been driving recently  Functional Status:  Mobility: Bed Mobility Overal bed mobility: Needs Assistance Bed Mobility: Supine to Sit Supine to sit: Min assist Sit to supine: Min assist General bed mobility comments: min A to elevate trunk and scoot fully out to EOB. min A to manage LEs back to bed at end of session Transfers Overall transfer level: Needs assistance Equipment used: Rolling walker (2 wheels) Transfers: Sit to/from Stand, Bed to chair/wheelchair/BSC Sit to Stand: Min assist, Contact guard assist Bed to/from chair/wheelchair/BSC transfer type:: Step pivot Step pivot transfers: Min assist General transfer comment: min A to power up from EOB at lowest heihgt, CGA on second attempt with cues for hand placement on each rise Ambulation/Gait Ambulation/Gait assistance: Min assist, Contact guard assist Gait Distance (Feet): 150 Feet Assistive device: Rolling walker (2 wheels) Gait Pattern/deviations: Decreased step length - right, Decreased step length - left, Decreased stride length General Gait Details: pt taking slow small steps with  flexed trunk, pt able to make corrective changes but unable to maintain, cues for cloaser Rw proximity. pt with R drift with fatigue Gait velocity: reduced Gait velocity interpretation: <1.31 ft/sec, indicative of household ambulator    ADL: ADL Overall ADL's : Needs assistance/impaired Eating/Feeding: Set up, Supervision/ safety, Sitting Eating/Feeding Details (indicate cue type and reason): in recliner Grooming: Set up, Sitting, Supervision/safety Grooming Details (indicate cue type and reason): EOB without spinal support Upper Body Bathing: Set up, Supervision/ safety, Sitting Upper Body Bathing Details (indicate cue type and reason): in recliner Lower Body Bathing: Maximal assistance Lower Body  Bathing Details (indicate cue type and reason): Mod A +2 sit<>stand Upper Body Dressing : Moderate assistance, Sitting Upper Body Dressing Details (indicate cue type and reason): in recliner Lower Body Dressing: Maximal assistance Lower Body Dressing Details (indicate cue type and reason): Mod A +2 sit<>stand Toilet Transfer: Minimal assistance, Ambulation, Rolling walker (2 wheels), Contact guard assist Toilet Transfer Details (indicate cue type and reason): one handed A from bed to recliner with +2 for safety/A Toileting- Clothing Manipulation and Hygiene: Maximal assistance Toileting - Clothing Manipulation Details (indicate cue type and reason): Mod A +2 sit<>stand Functional mobility during ADLs: Minimal assistance, Rolling walker (2 wheels)  Cognition: Cognition Orientation Level: Disoriented to time, Oriented to person, Oriented to place, Oriented to situation Cognition Arousal: Alert Behavior During Therapy: [facility] for tasks assessed/performed  Physical Exam: Blood pressure (!) 110/52, pulse 83, temperature (!) 97.4 F (36.3 C), temperature source Oral, resp. rate 16, height 5' 5 (1.651 m), weight 70.9 kg, SpO2 99%. Physical Exam Constitutional:      General: He is not in acute distress.    Appearance: He is not ill-appearing.     Comments: Constant hiccups  HENT:     Head: Normocephalic and atraumatic.     Right Ear: External ear normal.     Left Ear: External ear normal.     Nose: Nose normal.     Mouth/Throat:     Mouth: Mucous membranes are moist.     Pharynx: Oropharynx is clear.  Eyes:     Conjunctiva/sclera: Conjunctivae normal.     Pupils: Pupils are equal, round, and reactive to light.  Cardiovascular:     Rate and Rhythm: Normal rate and regular rhythm.     Heart sounds: No murmur heard.    No gallop.  Pulmonary:     Effort: Pulmonary effort is normal. No respiratory distress.     Breath sounds: No wheezing.     Comments: Intermittent productive  cough Abdominal:     General: Bowel sounds are normal. There is no distension.     Tenderness: There is no abdominal tenderness.  Genitourinary:    Comments: +Foley. Urine still with gross blood and sediment in bag Musculoskeletal:     Cervical back: Normal range of motion.  Skin:    Findings: Bruising present.  Neurological:     Mental Status: He is alert.     Comments: Patient is alert sitting up in bed.  Makes eye contact with examiner.  He is hard of hearing.  He does recall his latest stay on inpatient rehab services.  Provides his name and age. Oriented to place, month/year.  He was limited as far as medical historian. CN exam non-focal. MMT: 4+/5 bilateral UE prox to distal. BLE 4-/5 HF, KE and 4/5 ADF/PF. Sensory exam normal for light touch and pain in all 4 limbs. No limb ataxia or cerebellar signs. No abnormal tone  appreciated.    Psychiatric:        Mood and Affect: Mood normal.        Behavior: Behavior normal.     Results for orders placed or performed during the hospital encounter of 01/19/24 (from the past 48 hours)  Basic metabolic panel     Status: Abnormal   Collection Time: 01/28/24 10:35 AM  Result Value Ref Range   Sodium 139 135 - 145 mmol/L   Potassium 4.1 3.5 - 5.1 mmol/L   Chloride 104 98 - 111 mmol/L   CO2 28 22 - 32 mmol/L   Glucose, Bld 124 (H) 70 - 99 mg/dL    Comment: Glucose reference range applies only to samples taken after fasting for at least 8 hours.   BUN 24 (H) 8 - 23 mg/dL   Creatinine, Ser 9.09 0.61 - 1.24 mg/dL   Calcium  8.3 (L) 8.9 - 10.3 mg/dL   GFR, Estimated >39 >39 mL/min    Comment: (NOTE) Calculated using the CKD-EPI Creatinine Equation (2021)    Anion gap 7 5 - 15    Comment: Performed at [facility], [address], [place], [place] [address]   No results found.     Blood pressure (!) 110/52, pulse 83, temperature (!) 97.4 F (36.3 C), temperature source Oral, resp. rate 16, height 5' 5 (1.651 m), weight 70.9 kg,  SpO2 99%.  Medical Problem List and Plan: 1. Functional deficits secondary to septic shock secondary to acute pyelonephritis and obstructive uropathy with E. coli bacteremia.  Completing course of IV ceftriaxone   -patient may shower  -ELOS/Goals: 10-14 days, supervision goals with PT, and sup/min with OT 2.  Antithrombotics: -DVT/anticoagulation/right upper extremity SVT 12/3: Supportive care.  Mechanical: Antiembolism stockings, thigh (TED hose) Bilateral lower extremities  -antiplatelet therapy: Aspirin  81 mg daily and Plavix  75 mg daily 3. Pain Management:  Tylenol  as needed  -denied pain today 4. Mood/Behavior/Sleep: Melatonin 5 mg nightly as needed  -antipsychotic agents: N/A 5. Neuropsych/cognition: This patient is capable of making decisions on his own behalf. 6. Skin/Wound Care/WOC follow-up: Pressure injury to the sacrum 7. Fluids/Electrolytes/Nutrition: Routine IN and outs with follow-up chemistries  -decreased nutritional storage--encourage PO, appetite is not great but it's picking up per son 8.  Bilateral ureteral stones/left ureteral obstruction.  Status post bilateral ureteral stents 11/29 per Dr.Wrenn.  Foley tube in place  -has persistent hematuria and sediment 9.  Acute metabolic encephalopathy secondary to sepsis and acute infection.  Resolved with treatment. He is HOH 10.  New onset HFrEF.  Echocardiogram LVEF 35 to 40% with global hypokinesis.  Patient received IV diuresis and completed.  Elevated troponin felt to be related to demand ischemia trending down to 125 11.  Lactic acidosis.  Resolved with management of sepsis 12.  AKI.  Baseline creatinine 0.9.  Creatinine 1.75 with peak of 2.16.  Complicated by mild hydronephrosis from ureteral obstruction.  Creatinine improved AKI resolved.  Follow-up chemistries 13.  History of PAD.  Patient manage on DAPT for stent x 2 at SFA/popliteal artery.  Antiplatelets held on admission secondary to sepsis and thrombocytopenia and  resumed 01/27/2024 14.  New onset atrial fibrillation.  Follow-up cardiology services.  Continue Toprol -XL 50 mg daily 15.  History of subdural hematoma and received CIR.  Cranial CT scan 11/28 showing no acute abnormality 16.  Hypertension.  Monitor with increased mobility 17.  Recent right clavicle fracture.  Advanced to weightbearing as tolerated. 18.  Hyperlipidemia.  Lipitor 19.  Constipation.  MiraLAX  daily, Senokot S1 tablet nightly  -had BM just before I came to see him today 20 Intractable hiccups  -dc gabapentin   -trial of low-dose baclofen  5mg  bid. Observe for effect/tolerance   Toribio JINNY Pitch, PA-C 01/29/2024

## 2024-01-25 NOTE — Consult Note (Signed)
 Cardiology Consultation:   Patient ID: HAGOP MCCOLLAM MRN: [id number]; DOB: [birth year]  Admit date: 01/19/2024 Date of Consult: 01/25/2024  Primary Care Provider: [name] ORN, MD [facility] Cardiologist: None  [facility] Electrophysiologist:  None    Patient Profile:   Jerry [name] is a 89 y.o. male with a hx of AAA, hyperlipidemia, hypertension, MCTD, popliteal aneurysm status post stent 2024 and recent fall with subdural hemorrhage discharged from rehab 2 weeks ago  Who is being seen today for the evaluation of new onset atrial fibrillation and dilated cardiomyopathy at the request of Elgin Lam, MD.     History of Present Illness:   Jerry [name] is a 89 year old male with a history of AAA, hyperlipidemia, hypertension, MCTD, popliteal aneurysm status post stent 2024 and recent fall with subdural hemorrhage discharged from rehab 2 weeks ago .  Patient was just discharged from rehab 2 weeks ago after suffering of subdural hemorrhage and developed delirium and fell.  The family heard the fall and called EMS.  Upon EMS arrival patient was delirious and febrile to 103.  Initially hypotensive in the ER with systolic BP 80s and tachycardic.  Labs included WBC 14.9, SCR 1.75, mildly elevated LFTs, INR 1.3 and UA significant for UTI.  BNP mildly elevated at 292.  Lactic acid was 5.1.  He was given 2 L of IV fluids and despite that lactic acid increased to 7.  Chest x-ray showed no acute findings.  CT of the chest abdomen and pelvis demonstrated 2 mm calculus in the mid left ureter with mild obstructive uropathy.  He was started on broad-spectrum IV antibiotics.  He was also felt to have a COPD exacerbation and was started on IV Solu-Medrol  and nebulizers.    Urology was consulted and was taken to the OR for cystoscopy and bilateral ureteral stent placement.  Postop patient was placed on Levophed and neo for BP support.  Blood cultures grew out Enterobacter.  CO ox was low  at 55% on 01/23/2024.  2D echo done 01/20/2024 showed moderate LV dysfunction EF 35 to 40% with global HK and G1 DD, moderate RV dysfunction and mild low-flow low gradient aortic stenosis with DI 0.48, mean aortic valve gradient 4.5, AVA by VTI 1.36 cm and SVI low at 21.  Radial pressure at that time was calculated 15 mmHg..  This morning patient was found to have an irregular heartbeat and EKG showed atrial fibrillation with RVR and right bundle branch block.  Cardiology is now asked to consult for management of new onset atrial fibrillation and cardiomyopathy.  The patient is very hard of hearing.  His wife was at the bedside and very agitated and demanding that he be taken off the current floor he is because he is not getting  This.  The patient tells me that he has had shortness of breath off and on for about 6 months.  He has not really noticed any lower extremity edema.  He denies any chest pain or pressure, PND, orthopnea or increased abdominal fullness.  He says he has never had atrial fibrillation before that he is aware of and never felt palpitations.   Past Medical History:  Diagnosis Date   AAA (abdominal aortic aneurysm)    Aneurysm    History of multiple vascular aneurysms. [name] involve the aorta, te iliac arteries, and the popliteal arteries.  The pt is s/p stent graft of an abdominal aortic aneurysm. All of his aneurysm follow up as been done at  [facility].   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 This is manifested and is elevated, sed rate, pleuritis, Raynaud's pehnomenon and she does have a positive rheumatoid factor.  He is being treated with Plquenil for his collagen vascular disease   Colonic polyp    History of nephrolithiasis    Hyperlipidemia    Hypertension    Macular degeneration    Osteoarthrosis, hip    Left hip   Paget's disease     Past Surgical History:  Procedure Laterality Date   ABDOMINAL AORTIC ANEURYSM REPAIR      CYSTOSCOPY W/ URETERAL STENT PLACEMENT Bilateral 01/19/2024   Procedure: CYSTOSCOPY, WITH RETROGRADE PYELOGRAM AND URETERAL STENT INSERTION;  Surgeon: Watt Rush, MD;  Location: [facility] OR;  Service: Urology;  Laterality: Bilateral;  BILATERAL PYELOGRAM, POSSIBLE RIGHT URETERAL STENT PLACEMENT   DOPPLER ECHOCARDIOGRAPHY  2006   Ef greater than 60%. There are no regional wall motion abnormalities. There was mild mitral regurgitation, there is no aortic stenosis, aortic valve was mildly calcified   HERNIA REPAIR  1998   LOWER EXTREMITY ANGIOGRAPHY Left 01/30/2023   Procedure: Lower Extremity Angiography;  Surgeon: Marea Selinda RAMAN, MD;  Location: [facility] INVASIVE CV LAB;  Service: Cardiovascular;  Laterality: Left;   Lung Hamartoma       Home Medications:  Prior to Admission medications   Medication Sig Start Date End Date Taking? Authorizing Provider  acetaminophen  (TYLENOL ) 500 MG tablet Take 2 tablets (1,000 mg total) by mouth every 6 (six) hours as needed. 12/12/23  Yes Angiulli, Toribio PARAS, PA-C  aspirin  EC 81 MG tablet Take 1 tablet (81 mg total) by mouth daily. Swallow whole. 12/13/23  Yes Angiulli, Toribio PARAS, PA-C  atorvastatin  (LIPITOR) 40 MG tablet Take 1 tablet (40 mg total) by mouth daily. 12/19/23  Yes Angiulli, Toribio PARAS, PA-C  clopidogrel  (PLAVIX ) 75 MG tablet Take 1 tablet (75 mg total) by mouth daily. 12/19/23  Yes Angiulli, Toribio PARAS, PA-C  diltiazem  (CARDIZEM  CD) 240 MG 24 hr capsule Take 1 capsule (240 mg total) by mouth daily. 12/19/23 02/17/25 Yes Angiulli, Daniel J, PA-C  Multiple Vitamin (MULTIVITAMIN PO) Take 1 tablet by mouth daily.   Yes [provider]  Multiple Vitamins-Minerals (PRESERVISION AREDS PO) Take 1 capsule by mouth 2 (two) times daily.   Yes [provider]  tamsulosin  (FLOMAX ) 0.4 MG CAPS capsule Take 1 capsule (0.4 mg total) by mouth daily after supper. 12/19/23  Yes Angiulli, Toribio PARAS, PA-C  traZODone  (DESYREL ) 50 MG tablet Take 1 tablet (50 mg total) by  mouth at bedtime. Patient taking differently: Take 25 mg by mouth at bedtime. 12/19/23  Yes Angiulli, Toribio PARAS, PA-C  diclofenac  Sodium (VOLTAREN ) 1 % GEL Apply 2 g topically 4 (four) times daily. Patient not taking: Reported on 01/21/2024 12/19/23   Angiulli, Toribio PARAS, PA-C  lidocaine  (LIDODERM ) 5 % Place 2 patches onto the skin daily. Remove & Discard patch within 12 hours or as directed by MD Patient not taking: Reported on 01/21/2024 12/19/23   Angiulli, Toribio PARAS, PA-C  oxyCODONE  (OXY IR/ROXICODONE ) 5 MG immediate release tablet Take 1 tablet (5 mg total) by mouth every 4 (four) hours as needed for moderate pain (pain score 4-6) or breakthrough pain. Patient not taking: Reported on 01/21/2024 12/19/23   Angiulli, Daniel J, PA-C  polyethylene glycol (MIRALAX  / GLYCOLAX ) 17 g packet Take 17 g by mouth daily as needed for moderate constipation. Patient not taking: Reported on 01/21/2024 12/12/23  Angiulli, Toribio PARAS, PA-C  senna-docusate (SENOKOT-S) 8.6-50 MG tablet Take 1 tablet by mouth at bedtime. Patient not taking: Reported on 01/21/2024 12/18/23   Angiulli, Toribio PARAS, PA-C  topiramate  (TOPAMAX ) 25 MG tablet Take 1 tablet (25 mg total) by mouth 2 (two) times daily as needed (headache). Patient not taking: Reported on 01/21/2024 12/19/23   Angiulli, Daniel J, PA-C    Inpatient Medications: Scheduled Meds:  atorvastatin   40 mg Oral Daily   Chlorhexidine  Gluconate Cloth  6 each Topical Daily   famotidine   20 mg Oral Daily   feeding supplement  237 mL Oral TID BM   furosemide  40 mg Intravenous Daily   ipratropium-albuterol  3 mL Nebulization TID   metoprolol  succinate  25 mg Oral Daily   multivitamin with minerals  1 tablet Oral Daily   polyethylene glycol  17 g Oral Daily   senna-docusate  1 tablet Oral QHS   tamsulosin   0.4 mg Oral Daily   thiamine  100 mg Oral Daily   Continuous Infusions:  cefTRIAXone (ROCEPHIN)  IV 2 g (01/25/24 0540)   PRN Meds: acetaminophen ,  chlorpheniramine-HYDROcodone, docusate sodium , fentaNYL  (SUBLIMAZE ) injection, melatonin, mouth rinse, phenol  Allergies:    Allergies  Allergen Reactions   Ivp Dye [Iodinated Contrast Media]    Simvastatin     REACTION: joint pains   Soap Itching    fragrant soaps    Social History:   Social History   Socioeconomic History   Marital status: Married    Spouse name: Not on file   Number of children: Not on file   Years of education: Not on file   Highest education level: Not on file  Occupational History   Occupation: Retired  Tobacco Use   Smoking status: Former   Smokeless tobacco: Never   Tobacco comments:    Quit smoking more than 40 years ago  Advertising Account Planner   Vaping status: Not on file  Substance and Sexual Activity   Alcohol use: Yes    Comment: Rarely   Drug use: Not on file   Sexual activity: Not on file  Other Topics Concern   Not on file  Social History Narrative   Married and lives in [place]   3 children and grandchildren   Social Drivers of Health   Financial Resource Strain: Low Risk  (04/07/2023)   Received from [facility]   Overall Financial Resource Strain (CARDIA)    Difficulty of Paying Living Expenses: Not hard at all  Food Insecurity: No Food Insecurity (01/20/2024)   Hunger Vital Sign    Worried About Running Out of Food in the Last Year: Never true    Ran Out of Food in the Last Year: Never true  Transportation Needs: No Transportation Needs (01/20/2024)   PRAPARE - Administrator, Civil Service (Medical): No    Lack of Transportation (Non-Medical): No  Physical Activity: Not on file  Stress: Not on file  Social Connections: Socially Integrated (01/20/2024)   Social Connection and Isolation Panel    Frequency of Communication with Friends and Family: More than three times a week    Frequency of Social Gatherings with Friends and Family: Twice a week    Attends Religious Services: More than 4 times per year     Active Member of [employer] or Organizations: Yes    Attends Engineer, Structural: More than 4 times per year    Marital Status: Married  Catering Manager Violence: Not  At Risk (01/20/2024)   Humiliation, Afraid, Rape, and Kick questionnaire    Fear of Current or Ex-Partner: No    Emotionally Abused: No    Physically Abused: No    Sexually Abused: No    Family History:    Family History  Problem Relation Age of Onset   Heart attack Mother 66       MI   Alzheimer's disease Father      ROS:  Please see the history of present illness.   All other ROS reviewed and negative.     Physical Exam/Data:   Vitals:   01/25/24 0341 01/25/24 0832 01/25/24 0841 01/25/24 1502  BP: (!) 157/84  (!) 150/77 125/74  Pulse:   (!) 106 96  Resp: 18  17 16   Temp: 98.4 F (36.9 C)  98.4 F (36.9 C) 98.2 F (36.8 C)  TempSrc:      SpO2: 95% 91% 96% 97%  Weight:      Height:        Intake/Output Summary (Last 24 hours) at 01/25/2024 1504 Last data filed at 01/25/2024 1503 Gross per 24 hour  Intake 480 ml  Output 1800 ml  Net -1320 ml      01/23/2024    5:00 AM 01/22/2024    5:00 AM 01/21/2024    3:37 AM  Last 3 Weights  Weight (lbs) 160 lb 15 oz 164 lb 7.4 oz 172 lb 9.9 oz  Weight (kg) 73 kg 74.6 kg 78.3 kg     Body mass index is 26.78 kg/m.  General:  Well nourished, well developed, in no acute distress HEENT: normal Lymph: no adenopathy Neck: no JVD Endocrine:  No thryomegaly Vascular: No carotid bruits; FA pulses 2+ bilaterally without bruits  Cardiac:  normal S1, S2; irregularly irregular; no murmur  Lungs: Crackles at both bases Abd: soft, nontender, no hepatomegaly  Ext: no edema Musculoskeletal:  No deformities, BUE and BLE strength normal and equal Skin: warm and dry  Neuro:  CNs 2-12 intact, no focal abnormalities noted Psych:  Normal affect   EKG:  The EKG was personally reviewed and demonstrates: Atrial fibrillation with RVR to 107 bpm with right bundle  branch block Telemetry:  Telemetry was personally reviewed and demonstrates: Currently not on telemetry  Laboratory Data:  High Sensitivity Troponin:   Recent Labs  Lab 01/20/24 1402 01/21/24 0314  TROPONINIHS 147* 125*     Chemistry Recent Labs  Lab 01/22/24 0341 01/23/24 0327 01/24/24 0822  NA 136 139 140  K 3.9 4.1 4.2  CL 105 107 110  CO2 21* 24 24  GLUCOSE 128* 121* 102*  BUN 44* 42* 27*  CREATININE 1.50* 1.18 0.89  CALCIUM  7.9* 8.0* 8.2*  GFRNONAA 43* 58* >60  ANIONGAP 10 8 6     Recent Labs  Lab 01/20/24 1958 01/21/24 0314 01/22/24 0341  PROT 4.7* 4.6* 5.0*  ALBUMIN 2.5* 2.4* 2.5*  AST 155* 140* 80*  ALT 131* 145* 145*  ALKPHOS 134* 125 168*  BILITOT 0.7 1.0 0.7   Hematology Recent Labs  Lab 01/23/24 0327 01/24/24 0822 01/25/24 0246  WBC 38.6* 26.6* 21.5*  RBC 3.37* 3.71* 3.60*  HGB 10.6* 11.5* 11.0*  HCT 31.8* 35.0* 34.0*  MCV 94.4 94.3 94.4  MCH 31.5 31.0 30.6  MCHC 33.3 32.9 32.4  RDW 15.0 15.4 15.4  PLT 30* 53* 79*   BNP Recent Labs  Lab 01/19/24 1944 01/20/24 1130 01/22/24 0341  BNP 292.3* 1,086.3* 821.9*    DDimer  Recent Labs  Lab 01/20/24 1130  DDIMER >20.00*     Radiology/Studies:  VAS US  LOWER EXTREMITY VENOUS (DVT) Result Date: 01/24/2024  Lower Venous DVT Study Patient Name:  RADFORD PEASE Faraci  Date of Exam:   01/24/2024 Medical Rec #: [id number]         Accession #:    [id number] Date of Birth: [birth year]         Patient Gender: M Patient Age:   36 years Exam Location:  [facility] Procedure:      VAS US  LOWER EXTREMITY VENOUS (DVT) Referring Phys: ABDULLAHI HUSSEIN --------------------------------------------------------------------------------  Indications: Swelling, and Edema.  Comparison Study: No prior exam. Performing Technologist: Edilia Elden Appl  Examination Guidelines: A complete evaluation includes B-mode imaging, spectral Doppler, color Doppler, and power Doppler as needed of all accessible portions of  each vessel. Bilateral testing is considered an integral part of a complete examination. Limited examinations for reoccurring indications may be performed as noted. The reflux portion of the exam is performed with the patient in reverse Trendelenburg.  +---------+---------------+---------+-----------+----------+--------------+ RIGHT    CompressibilityPhasicitySpontaneityPropertiesThrombus Aging +---------+---------------+---------+-----------+----------+--------------+ CFV      Full           Yes      Yes                                 +---------+---------------+---------+-----------+----------+--------------+ SFJ      Full           Yes      Yes                                 +---------+---------------+---------+-----------+----------+--------------+ FV Prox  Full                                                        +---------+---------------+---------+-----------+----------+--------------+ FV Mid   Full                                                        +---------+---------------+---------+-----------+----------+--------------+ FV DistalFull                                                        +---------+---------------+---------+-----------+----------+--------------+ PFV      Full                                                        +---------+---------------+---------+-----------+----------+--------------+ POP      Full           Yes      Yes                                 +---------+---------------+---------+-----------+----------+--------------+  PTV      Full                                                        +---------+---------------+---------+-----------+----------+--------------+ PERO     Full                                                        +---------+---------------+---------+-----------+----------+--------------+   +----+---------------+---------+-----------+----------+--------------+  LEFTCompressibilityPhasicitySpontaneityPropertiesThrombus Aging +----+---------------+---------+-----------+----------+--------------+ CFV Full           Yes      Yes                                 +----+---------------+---------+-----------+----------+--------------+ SFJ Full           Yes      Yes                                 +----+---------------+---------+-----------+----------+--------------+     Summary: RIGHT: - There is no evidence of deep vein thrombosis in the lower extremity.  - No cystic structure found in the popliteal fossa.  LEFT: - No evidence of common femoral vein obstruction.   *See table(s) above for measurements and observations. Electronically signed by Debby Robertson on 01/24/2024 at 7:42:47 PM.    Final    VAS US  UPPER EXTREMITY VENOUS DUPLEX Result Date: 01/24/2024 UPPER VENOUS STUDY  Patient Name:  EDWORD CU  Date of Exam:   01/24/2024 Medical Rec #: [id number]         Accession #:    [id number] Date of Birth: Oct 06, 1928         Patient Gender: M Patient Age:   7 years Exam Location:  [facility] Procedure:      VAS US  UPPER EXTREMITY VENOUS DUPLEX Referring Phys: ABDULLAHI HUSSEIN --------------------------------------------------------------------------------  Indications: swollen, rule out DVT. Comparison Study: No prior exam. Performing Technologist: Edilia Elden Appl  Examination Guidelines: A complete evaluation includes B-mode imaging, spectral Doppler, color Doppler, and power Doppler as needed of all accessible portions of each vessel. Bilateral testing is considered an integral part of a complete examination. Limited examinations for reoccurring indications may be performed as noted.  Right Findings: +----------+------------+---------+-----------+----------+-------+ RIGHT     CompressiblePhasicitySpontaneousPropertiesSummary +----------+------------+---------+-----------+----------+-------+ IJV           Full       Yes       Yes                       +----------+------------+---------+-----------+----------+-------+ Subclavian    Full       Yes       Yes                      +----------+------------+---------+-----------+----------+-------+ Axillary      Full       Yes       Yes                      +----------+------------+---------+-----------+----------+-------+ Brachial  Full       Yes       Yes                      +----------+------------+---------+-----------+----------+-------+ Radial        Full                                          +----------+------------+---------+-----------+----------+-------+ Ulnar         Full                                          +----------+------------+---------+-----------+----------+-------+ Cephalic      None       No        No                       +----------+------------+---------+-----------+----------+-------+ Basilic       Full       Yes       Yes                      +----------+------------+---------+-----------+----------+-------+ Superficial vein thrombosis noted in the right cephalic vein from the shoulder to the antecubital fossa. All segments in the forearm are compressible.  Left Findings: +----------+------------+---------+-----------+----------+-------+ LEFT      CompressiblePhasicitySpontaneousPropertiesSummary +----------+------------+---------+-----------+----------+-------+ IJV           Full       Yes       Yes                      +----------+------------+---------+-----------+----------+-------+ Subclavian    Full       Yes       Yes                      +----------+------------+---------+-----------+----------+-------+  Summary:  Right: No evidence of deep vein thrombosis in the upper extremity. Findings consistent with acute superficial vein thrombosis involving the right cephalic vein.  Left: No evidence of thrombosis in the subclavian.  *See table(s) above for measurements and observations.   Diagnosing physician: Debby Robertson Electronically signed by Debby Robertson on 01/24/2024 at 4:08:52 PM.    Final      Assessment and Plan:   New onset atrial fibrillation with RVR Right bundle branch block - Noted to have irregular heartbeat on exam today and EKG confirmed atrial fibrillation with RVR - Heart rate in the low 100s on EKG - No history of A-fib in the past - Found to have dilated cardiomyopathy with EF 35 to 40% by echo several days ago and appears to be in CHF today which may have triggered A-fib - CHA2DS2-VASc score is 5 but anticoagulation at this time is contraindicated due to recent subdural bleed requiring hospitalization and rehab as well as frequent falls - Start Toprol  XL 25 mg daily - Check TSH  Acute combined systolic/diastolic CHF Dilated cardiomyopathy Elevated troponin - Became hypotensive after his ureteral stent placement requiring pressor support - Co. ox at that time was low at 55% - 2D echo shows new dilated cardiomyopathy with EF 35 to 40% and global HK with grade 1 diastolic dysfunction and RV dysfunction as well no significant valvular disease except for some very mild low-flow low gradient  aortic stenosis -Hypotension has resolved and now off pressor support -unclear etiology of his new cardiomyopathy>> given history of PAD he is at high risk for having underlying CAD as well -High-sensitivity troponin elevated at 147 and trended down to 125.  2D echo with global HK and no focal wall motion abnormality -Given recent subdural hematoma, and other comorbidities he is a poor candidate for further ischemic workup at this time with cardiac cath and recommend medical management -Starting Toprol  XL 25 mg daily for heart rate control -Add Lasix 40 mg IV daily for volume overload -Follow strict I's and O's, daily weights and renal function while diuresing -Once heart rate is adequately controlled we will add on GDMT with low-dose losartan and low-dose Spiro as  BP and renal function allow - given advanced age and risk for UTIs would avoid SGLT2i  Septic shock Pyelonephritis with obstructive uropathy E. coli bacteremia - BP initially hypotensive and requiring pressor support but now off pressors -Status post bilateral ureteral stents 1129 with cystoscopy - IV antibiotics per [facility] and urology         New [place]  Heart Association (NYHA) Functional Class NYHA Class II  CHA2DS2-VASc Score = 5   This indicates a 7.2% annual risk of stroke. The patient's score is based upon: CHF History: 1 HTN History: 1 Diabetes History: 0 Stroke History: 0 Vascular Disease History: 1 Age Score: 2 Gender Score: 0        For questions or updates, please contact [facility] Please consult [email or web address] for contact info under    Signed, Wilbert Bihari, MD  01/25/2024 3:04 PM

## 2024-01-25 NOTE — Progress Notes (Signed)
 6 Days Post-Op Subjective: Patient resting in bed.  He is accompanied by his wife and 2 daughters.  Case and plan reviewed regarding his Foley management, definitive stone management, and pending rehabilitation.  Objective: Vital signs in last 24 hours: Temp:  [97.8 F (36.6 C)-98.4 F (36.9 C)] 98.4 F (36.9 C) (12/04 0841) Pulse Rate:  [97-106] 106 (12/04 0841) Resp:  [16-19] 17 (12/04 0841) BP: (129-157)/(74-87) 150/77 (12/04 0841) SpO2:  [91 %-97 %] 96 % (12/04 0841) FiO2 (%):  [21 %] 21 % (12/03 2002)  Assessment/Plan: #bilateral ureteral stones #Sepsis- resolved #gross hematuria  Pan sensitive E. Coli  UTI and bacteremia. Continue Ceftriaxone.  Continues to have incremental improvement in his leukocytosis, though remains quite elevated.  S/p bilateral ureteral stent placement with Dr. Watt 11/29. Ureteroscopy/definitive stone mgmt on an outpt basis.  Plavix  washout. Urine clearing.  A small amount of frank bleeding noted in bag but clear yellow urine in tubing.  Still improving overall.    Unless medically contraindicated, please restart his Flomax .  Will consider voiding trial afterward  Intake/Output from previous day: 12/03 0701 - 12/04 0700 In: -  Out: 1800 [Urine:1800]  Intake/Output this shift: Total I/O In: 240 [P.O.:240] Out: -   Physical Exam:  General: Decompensated CV: No cyanosis Lungs: equal chest rise Abdomen: Soft, NTND, no rebound or guarding Gu: foley in place with clear yellow urine in tubing, lightly blood-tinged clear urine in bag.  Lab Results: Recent Labs    01/23/24 0327 01/24/24 0822 01/25/24 0246  HGB 10.6* 11.5* 11.0*  HCT 31.8* 35.0* 34.0*   BMET Recent Labs    01/23/24 0327 01/24/24 0822 01/25/24 0246  NA 139 140  --   K 4.1 4.2  --   CL 107 110  --   CO2 24 24  --   GLUCOSE 121* 102*  --   BUN 42* 27*  --   CREATININE 1.18 0.89  --   CALCIUM  8.0* 8.2*  --   HGB 10.6* 11.5* 11.0*  WBC 38.6* 26.6* 21.5*      Studies/Results: VAS US  LOWER EXTREMITY VENOUS (DVT) Result Date: 01/24/2024  Lower Venous DVT Study Patient Name:  Jerry [name] Perl  Date of Exam:   01/24/2024 Medical Rec #: [id number]         Accession #:    [id number] Date of Birth: 04-20-1928         Patient Gender: M Patient Age:   89 years Exam Location:  [facility] Procedure:      VAS US  LOWER EXTREMITY VENOUS (DVT) Referring Phys: ABDULLAHI HUSSEIN --------------------------------------------------------------------------------  Indications: Swelling, and Edema.  Comparison Study: No prior exam. Performing Technologist: Edilia Elden Appl  Examination Guidelines: A complete evaluation includes B-mode imaging, spectral Doppler, color Doppler, and power Doppler as needed of all accessible portions of each vessel. Bilateral testing is considered an integral part of a complete examination. Limited examinations for reoccurring indications may be performed as noted. The reflux portion of the exam is performed with the patient in reverse Trendelenburg.  +---------+---------------+---------+-----------+----------+--------------+ RIGHT    CompressibilityPhasicitySpontaneityPropertiesThrombus Aging +---------+---------------+---------+-----------+----------+--------------+ CFV      Full           Yes      Yes                                 +---------+---------------+---------+-----------+----------+--------------+ SFJ      Full  Yes      Yes                                 +---------+---------------+---------+-----------+----------+--------------+ FV Prox  Full                                                        +---------+---------------+---------+-----------+----------+--------------+ FV Mid   Full                                                        +---------+---------------+---------+-----------+----------+--------------+ FV DistalFull                                                         +---------+---------------+---------+-----------+----------+--------------+ PFV      Full                                                        +---------+---------------+---------+-----------+----------+--------------+ POP      Full           Yes      Yes                                 +---------+---------------+---------+-----------+----------+--------------+ PTV      Full                                                        +---------+---------------+---------+-----------+----------+--------------+ PERO     Full                                                        +---------+---------------+---------+-----------+----------+--------------+   +----+---------------+---------+-----------+----------+--------------+ LEFTCompressibilityPhasicitySpontaneityPropertiesThrombus Aging +----+---------------+---------+-----------+----------+--------------+ CFV Full           Yes      Yes                                 +----+---------------+---------+-----------+----------+--------------+ SFJ Full           Yes      Yes                                 +----+---------------+---------+-----------+----------+--------------+     Summary: RIGHT: - There is no evidence of deep vein thrombosis in the lower extremity.  - No cystic structure found  in the popliteal fossa.  LEFT: - No evidence of common femoral vein obstruction.   *See table(s) above for measurements and observations. Electronically signed by Debby Robertson on 01/24/2024 at 7:42:47 PM.    Final    VAS US  UPPER EXTREMITY VENOUS DUPLEX Result Date: 01/24/2024 UPPER VENOUS STUDY  Patient Name:  RHYAN RADLER  Date of Exam:   01/24/2024 Medical Rec #: [id number]         Accession #:    [id number] Date of Birth: [birth year]         Patient Gender: M Patient Age:   51 years Exam Location:  [facility] Procedure:      VAS US  UPPER EXTREMITY VENOUS DUPLEX Referring Phys: ABDULLAHI HUSSEIN  --------------------------------------------------------------------------------  Indications: swollen, rule out DVT. Comparison Study: No prior exam. Performing Technologist: Edilia Elden Appl  Examination Guidelines: A complete evaluation includes B-mode imaging, spectral Doppler, color Doppler, and power Doppler as needed of all accessible portions of each vessel. Bilateral testing is considered an integral part of a complete examination. Limited examinations for reoccurring indications may be performed as noted.  Right Findings: +----------+------------+---------+-----------+----------+-------+ RIGHT     CompressiblePhasicitySpontaneousPropertiesSummary +----------+------------+---------+-----------+----------+-------+ IJV           Full       Yes       Yes                      +----------+------------+---------+-----------+----------+-------+ Subclavian    Full       Yes       Yes                      +----------+------------+---------+-----------+----------+-------+ Axillary      Full       Yes       Yes                      +----------+------------+---------+-----------+----------+-------+ Brachial      Full       Yes       Yes                      +----------+------------+---------+-----------+----------+-------+ Radial        Full                                          +----------+------------+---------+-----------+----------+-------+ Ulnar         Full                                          +----------+------------+---------+-----------+----------+-------+ Cephalic      None       No        No                       +----------+------------+---------+-----------+----------+-------+ Basilic       Full       Yes       Yes                      +----------+------------+---------+-----------+----------+-------+ Superficial vein thrombosis noted in the right cephalic vein from the shoulder to the antecubital fossa. All segments in the forearm  are compressible.  Left Findings: +----------+------------+---------+-----------+----------+-------+ LEFT  CompressiblePhasicitySpontaneousPropertiesSummary +----------+------------+---------+-----------+----------+-------+ IJV           Full       Yes       Yes                      +----------+------------+---------+-----------+----------+-------+ Subclavian    Full       Yes       Yes                      +----------+------------+---------+-----------+----------+-------+  Summary:  Right: No evidence of deep vein thrombosis in the upper extremity. Findings consistent with acute superficial vein thrombosis involving the right cephalic vein.  Left: No evidence of thrombosis in the subclavian.  *See table(s) above for measurements and observations.  Diagnosing physician: Debby Robertson Electronically signed by Debby Robertson on 01/24/2024 at 4:08:52 PM.    Final       LOS: 6 days   Ole Bourdon, NP [facility] Pager: [phone]  01/25/2024, 9:29 AM

## 2024-01-25 NOTE — Progress Notes (Addendum)
   Inpatient Rehabilitation Admissions Coordinator   I have messaged Dr Briana that I can admit to CIR today pending his clearance that he can be discharged. I await clarification.  Heron Leavell, RN, MSN Rehab Admissions Coordinator [phone] 01/25/2024 10:19 AM  We will admit to CIR today. Acute team and TOC made aware. I will make the arrangements. I met with patient and family at bedside and they are in agreement.  Heron Leavell, RN, MSN Rehab Admissions Coordinator [phone] 01/25/2024 11:01 AM

## 2024-01-25 NOTE — Progress Notes (Signed)
   Inpatient Rehabilitation Admissions Coordinator   Cardiology has placed discharge to CIR on hold for today. I will follow up tomorrow.  Heron Leavell, RN, MSN Rehab Admissions Coordinator [phone] 01/25/2024 3:41 PM

## 2024-01-25 NOTE — Progress Notes (Signed)
 Mobility Specialist Progress Note:   01/25/24 1135  Mobility  Activity Ambulated with assistance (In hallway)  Level of Assistance Minimal assist, patient does 75% or more  Assistive Device Front wheel walker  Distance Ambulated (ft) 80 ft  RUE Weight Bearing Per Provider Order WBAT  Activity Response Tolerated well  Mobility Referral Yes  Mobility visit 1 Mobility  Mobility Specialist Start Time (ACUTE ONLY) 1121  Mobility Specialist Stop Time (ACUTE ONLY) 1135  Mobility Specialist Time Calculation (min) (ACUTE ONLY) 14 min   Received pt in chair and agreeable to mobility. Pt required MinA STS and chair follow for safety. C/o abdominal pain and fatigue, otherwise tolerated well. Pt had x2 seated rest breaks d/t fatigue. Returned pt to room via chair. Left in chair with personal belongings and call light within reach. All needs met. Family present.  Lavanda Pollack Mobility Specialist  Please contact via [employer] or  Rehab Office [phone]

## 2024-01-25 NOTE — Progress Notes (Addendum)
 PROGRESS NOTE    Jerry [name]  FMW:[id number] DOB: Dec 31, 1928 DOA: 01/19/2024 PCP: [name] ORN, MD   Brief Narrative: Jerry [name] is a 89 y.o. male with a history of AAA, popliteal aneurysm s/p stent and DAPT, hyperlipidemia, hypertension, subdural hematoma, hyperlipidemia.  Patient presented secondary to a fall and delirium, found to have evidence of septic shock secondary to obstructive uropathy, in addition to pyelonephritis and bacteremia. Patient required ICU admission and intubation. Urology consulted and performed urgent bilateral ureteral stent placements. Symptoms improving with treatment.   Assessment and Plan:  Septic shock Present on admission. Secondary to acute pyelonephritis and obstructive uropathy with evidence of E. Coli bacteremia. Patient started empirically on Vancomycin  and Zosyn  initially and transitioned to Ceftriaxone  IV. Patient required ICU admission and was started on dobutamine , vasopressin , phenylephrine  and norepinephrine . Vasopressors weaned off with treatment of infection and ureteral obstruction.  Acute pyelonephritis E. Coli bacteremia Secondary to left ureter obstruction. Urine culture significant for E. Coli. Blood culture also with E. Coli. Empiric Vancomycin  and Zosyn  transitioned to Ceftriaxone  for management. -Continue Ceftriaxone  IV, will treat for 7-10 days  Bilateral ureteral stones Left ureter obstruction Urology consulted and performed cystoscopy with insertion of bilateral ureteral stents on 11/29.  Acute metabolic encephalopathy Secondary to sepsis and acute infection. Resolved with treatment.  New onset HFrEF Unclear etiology, but patient does have a history of PAD, so ischemia is a possible etiology. Could also be related to sepsis.  Troponin elevated at 147 with downtrend to 125. Transthoracic Echocardiogram significant for an LVEF of 35-40% with global hypokinesis. Patient managed, shortly, with Lasix . Cardiology  consulted and have restarted Lasix  IV diuresis -Cardiology recommendations: Lasix  IV, Toprol  XL -Daily weights/strict in/out -Cardiac monitoring  Atrial fibrillation with RVR Noted to have an irregular rhythm on exam. EKG obtained and confirms atrial fibrillation. Complicated by low LVEF. Cardiology consulted. -Cardiology recommendations: metoprolol  -Cardiac monitoring  Acute respiratory failure with hypoxia Patient with respiratory distress and hypoxia secondary to COPD vs heart failure prior to admission to ICU. Resolved.  Lactic acidosis Lactic acid of 4.1 on admission. Resolved with management of sepsis.  AKI Baseline creatinine of around 0.9. Creatinine of 1.75 with peak of 2.16. Complicated by mild hydronephrosis from ureteral obstruction. Creatinine improved. AKI resolved.   Right upper extremity SVT Diagnosed on 12/3. -Supportive care  PAD Patient managed on DAPT for stent x2 at SFA/popliteal artery. Antiplatelets held on admission secondary to sepsis and thrombocytopenia. Patient with atrial fibrillation, however per cardiology, unlikely he will be started on anticoagulation. May consider restarting antiplatelets.  Thrombocytopenia Likely secondary to acute infection. Platelets down to a low of 30,000. Improved to 79,000.  Cough Likely related to recent intubation. Clear production. -Tussionex at bedtime as needed  History of subdural hematoma Noted. No evidence of hematoma on CT head obtained this admission.  AAA Noted.  Cholelithiasis Noted incidentally on CT imaging. Asymptomatic.  Bilateral rib fractures Noted. No evidence for acute fracture.  Aortic atherosclerosis Noted.  Moderate malnutrition -Dietitian recommendations (12/2):  Constipation -Continue MiraLAX  and Colace  Pressure injury Right/mid/left sacrum. Unclear if present on admission.   DVT prophylaxis: SCDs Code Status:   Code Status: Limited: Do not attempt resuscitation (DNR)  -DNR-LIMITED -Do Not Intubate/DNI  Family Communication: Wife and daughter at bedside Disposition Plan: Discharge possibly to acute inpatient rehabilitation likely in 2-3 days pending improvement of atrial fibrillation and heart failure   Consultants:  PCCM Urology  Procedures:  Transthoracic Echocardiogram Cystoscopy with insertion of bilateral  ureteral stents.   Antimicrobials: Vancomycin Zosyn Ceftriaxone    Subjective: Patient reports coughing overnight. Also concerned that he has not had a bowel movement. He reports some abdominal pain. No nausea or vomiting.  Objective: BP 125/74 (BP Location: Left Arm)   Pulse 96   Temp 98.2 F (36.8 C)   Resp 16   Ht 5' 5 (1.651 m)   Wt 73 kg   SpO2 97%   BMI 26.78 kg/m   Examination:  General exam: Appears calm and comfortable. Respiratory system: Clear to auscultation. Respiratory effort normal. Cardiovascular system: S1 & S2 heard,irregular rhythm, normal rate. Gastrointestinal system: Abdomen is nondistended, soft and nontender. Normal bowel sounds heard. Central nervous system: Somnolent but arouses and answers questions appropriately Musculoskeletal: No calf tenderness   Data Reviewed: I have personally reviewed following labs and imaging studies  CBC Lab Results  Component Value Date   WBC 21.5 (H) 01/25/2024   RBC 3.60 (L) 01/25/2024   HGB 11.0 (L) 01/25/2024   HCT 34.0 (L) 01/25/2024   MCV 94.4 01/25/2024   MCH 30.6 01/25/2024   PLT 79 (L) 01/25/2024   MCHC 32.4 01/25/2024   RDW 15.4 01/25/2024   LYMPHSABS 0.1 (L) 01/19/2024   MONOABS 0.1 01/19/2024   EOSABS 0.0 01/19/2024   BASOSABS 0.0 01/19/2024     Last metabolic panel Lab Results  Component Value Date   NA 140 01/24/2024   K 4.2 01/24/2024   CL 110 01/24/2024   CO2 24 01/24/2024   BUN 27 (H) 01/24/2024   CREATININE 0.89 01/24/2024   GLUCOSE 102 (H) 01/24/2024   GFRNONAA >60 01/24/2024   GFRAA >60 10/17/2018   CALCIUM  8.2 (L)  01/24/2024   PHOS 2.5 01/23/2024   PROT 5.0 (L) 01/22/2024   ALBUMIN 2.5 (L) 01/22/2024   BILITOT 0.7 01/22/2024   ALKPHOS 168 (H) 01/22/2024   AST 80 (H) 01/22/2024   ALT 145 (H) 01/22/2024   ANIONGAP 6 01/24/2024    GFR: Estimated Creatinine Clearance: 45.1 mL/min (by C-G formula based on SCr of 0.89 mg/dL).  Recent Results (from the past 240 hours)  Urine Culture     Status: Abnormal   Collection Time: 01/19/24  7:34 PM   Specimen: Urine, Random  Result Value Ref Range Status   Specimen Description URINE, RANDOM  Final   Special Requests   Final    NONE Reflexed from [id number] Performed at [facility], [address] [place], [place] [address]    Culture >=100,000 COLONIES/mL ESCHERICHIA COLI (A)  Final   Report Status 01/21/2024 FINAL  Final   Organism ID, Bacteria ESCHERICHIA COLI (A)  Final      Susceptibility   Escherichia coli - MIC*    AMPICILLIN 8 SENSITIVE Sensitive     CEFAZOLIN  (URINE) Value in next row Sensitive      2 SENSITIVEThis is a modified FDA-approved test that has been validated and its performance characteristics determined by the reporting laboratory.  This laboratory is certified under the Clinical Laboratory Improvement Amendments CLIA as qualified to perform high complexity clinical laboratory testing.    CEFEPIME Value in next row Sensitive      2 SENSITIVEThis is a modified FDA-approved test that has been validated and its performance characteristics determined by the reporting laboratory.  This laboratory is certified under the Clinical Laboratory Improvement Amendments CLIA as qualified to perform high complexity clinical laboratory testing.    ERTAPENEM Value in next row Sensitive      2  SENSITIVEThis is a modified FDA-approved test that has been validated and its performance characteristics determined by the reporting laboratory.  This laboratory is certified under the Clinical Laboratory Improvement Amendments CLIA as qualified to perform high  complexity clinical laboratory testing.    CEFTRIAXONE Value in next row Sensitive      2 SENSITIVEThis is a modified FDA-approved test that has been validated and its performance characteristics determined by the reporting laboratory.  This laboratory is certified under the Clinical Laboratory Improvement Amendments CLIA as qualified to perform high complexity clinical laboratory testing.    CIPROFLOXACIN Value in next row Sensitive      2 SENSITIVEThis is a modified FDA-approved test that has been validated and its performance characteristics determined by the reporting laboratory.  This laboratory is certified under the Clinical Laboratory Improvement Amendments CLIA as qualified to perform high complexity clinical laboratory testing.    GENTAMICIN Value in next row Sensitive      2 SENSITIVEThis is a modified FDA-approved test that has been validated and its performance characteristics determined by the reporting laboratory.  This laboratory is certified under the Clinical Laboratory Improvement Amendments CLIA as qualified to perform high complexity clinical laboratory testing.    NITROFURANTOIN Value in next row Sensitive      2 SENSITIVEThis is a modified FDA-approved test that has been validated and its performance characteristics determined by the reporting laboratory.  This laboratory is certified under the Clinical Laboratory Improvement Amendments CLIA as qualified to perform high complexity clinical laboratory testing.    TRIMETH/SULFA Value in next row Sensitive      2 SENSITIVEThis is a modified FDA-approved test that has been validated and its performance characteristics determined by the reporting laboratory.  This laboratory is certified under the Clinical Laboratory Improvement Amendments CLIA as qualified to perform high complexity clinical laboratory testing.    AMPICILLIN/SULBACTAM Value in next row Sensitive      2 SENSITIVEThis is a modified FDA-approved test that has been  validated and its performance characteristics determined by the reporting laboratory.  This laboratory is certified under the Clinical Laboratory Improvement Amendments CLIA as qualified to perform high complexity clinical laboratory testing.    PIP/TAZO Value in next row Sensitive      <=4 SENSITIVEThis is a modified FDA-approved test that has been validated and its performance characteristics determined by the reporting laboratory.  This laboratory is certified under the Clinical Laboratory Improvement Amendments CLIA as qualified to perform high complexity clinical laboratory testing.    MEROPENEM Value in next row Sensitive      <=4 SENSITIVEThis is a modified FDA-approved test that has been validated and its performance characteristics determined by the reporting laboratory.  This laboratory is certified under the Clinical Laboratory Improvement Amendments CLIA as qualified to perform high complexity clinical laboratory testing.    * >=100,000 COLONIES/mL ESCHERICHIA COLI  Blood Culture (routine x 2)     Status: Abnormal   Collection Time: 01/19/24  7:44 PM   Specimen: BLOOD  Result Value Ref Range Status   Specimen Description BLOOD SITE NOT SPECIFIED  Final   Special Requests   Final    BOTTLES DRAWN AEROBIC AND ANAEROBIC Blood Culture adequate volume   Culture  Setup Time   Final    GRAM NEGATIVE RODS IN BOTH AEROBIC AND ANAEROBIC BOTTLES CRITICAL RESULT CALLED TO, READ BACK BY AND VERIFIED WITH: PHARMD J LEDFORD 01/20/2024 @ 0622 BY AB Performed at [facility], [address], [place], [place]  [address]    Culture ESCHERICHIA COLI (A)  Final   Report Status 01/22/2024 FINAL  Final   Organism ID, Bacteria ESCHERICHIA COLI  Final      Susceptibility   Escherichia coli - MIC*    AMPICILLIN 8 SENSITIVE Sensitive     CEFAZOLIN  (NON-URINE) 4 INTERMEDIATE Intermediate     CEFEPIME <=0.12 SENSITIVE Sensitive     ERTAPENEM <=0.12 SENSITIVE Sensitive     CEFTRIAXONE <=0.25  SENSITIVE Sensitive     CIPROFLOXACIN <=0.06 SENSITIVE Sensitive     GENTAMICIN <=1 SENSITIVE Sensitive     MEROPENEM <=0.25 SENSITIVE Sensitive     TRIMETH/SULFA <=20 SENSITIVE Sensitive     AMPICILLIN/SULBACTAM <=2 SENSITIVE Sensitive     PIP/TAZO Value in next row Sensitive      <=4 SENSITIVEThis is a modified FDA-approved test that has been validated and its performance characteristics determined by the reporting laboratory.  This laboratory is certified under the Clinical Laboratory Improvement Amendments CLIA as qualified to perform high complexity clinical laboratory testing.    * ESCHERICHIA COLI  Blood Culture ID Panel (Reflexed)     Status: Abnormal   Collection Time: 01/19/24  7:44 PM  Result Value Ref Range Status   Enterococcus faecalis NOT DETECTED NOT DETECTED Final   Enterococcus Faecium NOT DETECTED NOT DETECTED Final   Listeria monocytogenes NOT DETECTED NOT DETECTED Final   Staphylococcus species NOT DETECTED NOT DETECTED Final   Staphylococcus aureus (BCID) NOT DETECTED NOT DETECTED Final   Staphylococcus epidermidis NOT DETECTED NOT DETECTED Final   Staphylococcus lugdunensis NOT DETECTED NOT DETECTED Final   Streptococcus species NOT DETECTED NOT DETECTED Final   Streptococcus agalactiae NOT DETECTED NOT DETECTED Final   Streptococcus pneumoniae NOT DETECTED NOT DETECTED Final   Streptococcus pyogenes NOT DETECTED NOT DETECTED Final   A.calcoaceticus-baumannii NOT DETECTED NOT DETECTED Final   Bacteroides fragilis NOT DETECTED NOT DETECTED Final   Enterobacterales DETECTED (A) NOT DETECTED Final    Comment: Enterobacterales represent a large order of gram negative bacteria, not a single organism. CRITICAL RESULT CALLED TO, READ BACK BY AND VERIFIED WITH: PHARMD J LEDFORD 01/20/2024 @ 0622 BY AB    Enterobacter cloacae complex NOT DETECTED NOT DETECTED Final   Escherichia coli DETECTED (A) NOT DETECTED Final    Comment: CRITICAL RESULT CALLED TO, READ BACK BY AND  VERIFIED WITH: PHARMD J LEDFORD 01/20/2024 @ 0622 BY AB    Klebsiella aerogenes NOT DETECTED NOT DETECTED Final   Klebsiella oxytoca NOT DETECTED NOT DETECTED Final   Klebsiella pneumoniae NOT DETECTED NOT DETECTED Final   Proteus species NOT DETECTED NOT DETECTED Final   Salmonella species NOT DETECTED NOT DETECTED Final   Serratia marcescens NOT DETECTED NOT DETECTED Final   Haemophilus influenzae NOT DETECTED NOT DETECTED Final   Neisseria meningitidis NOT DETECTED NOT DETECTED Final   Pseudomonas aeruginosa NOT DETECTED NOT DETECTED Final   Stenotrophomonas maltophilia NOT DETECTED NOT DETECTED Final   Candida albicans NOT DETECTED NOT DETECTED Final   Candida auris NOT DETECTED NOT DETECTED Final   Candida glabrata NOT DETECTED NOT DETECTED Final   Candida krusei NOT DETECTED NOT DETECTED Final   Candida parapsilosis NOT DETECTED NOT DETECTED Final   Candida tropicalis NOT DETECTED NOT DETECTED Final   Cryptococcus neoformans/gattii NOT DETECTED NOT DETECTED Final   CTX-M ESBL NOT DETECTED NOT DETECTED Final   Carbapenem resistance IMP NOT DETECTED NOT DETECTED Final   Carbapenem resistance KPC NOT DETECTED NOT DETECTED Final   Carbapenem resistance NDM NOT  DETECTED NOT DETECTED Final   Carbapenem resist OXA 48 LIKE NOT DETECTED NOT DETECTED Final   Carbapenem resistance VIM NOT DETECTED NOT DETECTED Final    Comment: Performed at [facility], [address], [place], [place] [address]  Blood Culture (routine x 2)     Status: Abnormal   Collection Time: 01/19/24  8:26 PM   Specimen: BLOOD  Result Value Ref Range Status   Specimen Description BLOOD SITE NOT SPECIFIED  Final   Special Requests   Final    BOTTLES DRAWN AEROBIC AND ANAEROBIC Blood Culture adequate volume   Culture  Setup Time   Final    GRAM NEGATIVE RODS IN BOTH AEROBIC AND ANAEROBIC BOTTLES CRITICAL VALUE NOTED.  VALUE IS CONSISTENT WITH PREVIOUSLY REPORTED AND CALLED VALUE.    Culture (A)  Final     ESCHERICHIA COLI SUSCEPTIBILITIES PERFORMED ON PREVIOUS CULTURE WITHIN THE LAST 5 DAYS. Performed at [facility], [address] [place], [place] [address]    Report Status 01/22/2024 FINAL  Final  Resp panel by RT-PCR (RSV, Flu A&B, Covid) Anterior Nasal Swab     Status: None   Collection Time: 01/19/24  8:29 PM   Specimen: Anterior Nasal Swab  Result Value Ref Range Status   SARS Coronavirus 2 by RT PCR NEGATIVE NEGATIVE Final   Influenza A by PCR NEGATIVE NEGATIVE Final   Influenza B by PCR NEGATIVE NEGATIVE Final    Comment: (NOTE) The Xpert Xpress SARS-CoV-2/FLU/RSV plus assay is intended as an aid in the diagnosis of influenza from Nasopharyngeal swab specimens and should not be used as a sole basis for treatment. Nasal washings and aspirates are unacceptable for Xpert Xpress SARS-CoV-2/FLU/RSV testing.  Fact Sheet for Patients: bloggercourse.com  Fact Sheet for Healthcare Providers: seriousbroker.it  This test is not yet approved or cleared by the [place]  FDA and has been authorized for detection and/or diagnosis of SARS-CoV-2 by FDA under an Emergency Use Authorization (EUA). This EUA will remain in effect (meaning this test can be used) for the duration of the COVID-19 declaration under Section 564(b)(1) of the Act, 21 U.S.C. section 360bbb-3(b)(1), unless the authorization is terminated or revoked.     Resp Syncytial Virus by PCR NEGATIVE NEGATIVE Final    Comment: (NOTE) Fact Sheet for Patients: bloggercourse.com  Fact Sheet for Healthcare Providers: seriousbroker.it  This test is not yet approved or cleared by the [place]  FDA and has been authorized for detection and/or diagnosis of SARS-CoV-2 by FDA under an Emergency Use Authorization (EUA). This EUA will remain in effect (meaning this test can be used) for the duration of the COVID-19  declaration under Section 564(b)(1) of the Act, 21 U.S.C. section 360bbb-3(b)(1), unless the authorization is terminated or revoked.  Performed at [facility], [address] [place], [place] [address]   MRSA Next Gen by PCR, Nasal     Status: None   Collection Time: 01/20/24 12:32 AM   Specimen: Nasal Mucosa; Nasal Swab  Result Value Ref Range Status   MRSA by PCR Next Gen NOT DETECTED NOT DETECTED Final    Comment: (NOTE) The GeneXpert MRSA Assay (FDA approved for NASAL specimens only), is one component of a comprehensive MRSA colonization surveillance program. It is not intended to diagnose MRSA infection nor to guide or monitor treatment for MRSA infections. Test performance is not FDA approved in patients less than 75 years old. Performed at [facility], [address], [place],  [place] [address]       Radiology Studies: VAS US  LOWER EXTREMITY VENOUS (DVT) Result Date: 01/24/2024  Lower Venous DVT Study Patient Name:  DMARIO RUSSOM Carrara  Date of Exam:   01/24/2024 Medical Rec #: [id number]         Accession #:    [id number] Date of Birth: Mar 25, 1928         Patient Gender: M Patient Age:   85 years Exam Location:  [facility] Procedure:      VAS US  LOWER EXTREMITY VENOUS (DVT) Referring Phys: ABDULLAHI HUSSEIN --------------------------------------------------------------------------------  Indications: Swelling, and Edema.  Comparison Study: No prior exam. Performing Technologist: Edilia Elden Appl  Examination Guidelines: A complete evaluation includes B-mode imaging, spectral Doppler, color Doppler, and power Doppler as needed of all accessible portions of each vessel. Bilateral testing is considered an integral part of a complete examination. Limited examinations for reoccurring indications may be performed as noted. The reflux portion of the exam is performed with the patient in reverse Trendelenburg.   +---------+---------------+---------+-----------+----------+--------------+ RIGHT    CompressibilityPhasicitySpontaneityPropertiesThrombus Aging +---------+---------------+---------+-----------+----------+--------------+ CFV      Full           Yes      Yes                                 +---------+---------------+---------+-----------+----------+--------------+ SFJ      Full           Yes      Yes                                 +---------+---------------+---------+-----------+----------+--------------+ FV Prox  Full                                                        +---------+---------------+---------+-----------+----------+--------------+ FV Mid   Full                                                        +---------+---------------+---------+-----------+----------+--------------+ FV DistalFull                                                        +---------+---------------+---------+-----------+----------+--------------+ PFV      Full                                                        +---------+---------------+---------+-----------+----------+--------------+ POP      Full           Yes      Yes                                 +---------+---------------+---------+-----------+----------+--------------+ PTV  Full                                                        +---------+---------------+---------+-----------+----------+--------------+ PERO     Full                                                        +---------+---------------+---------+-----------+----------+--------------+   +----+---------------+---------+-----------+----------+--------------+ LEFTCompressibilityPhasicitySpontaneityPropertiesThrombus Aging +----+---------------+---------+-----------+----------+--------------+ CFV Full           Yes      Yes                                  +----+---------------+---------+-----------+----------+--------------+ SFJ Full           Yes      Yes                                 +----+---------------+---------+-----------+----------+--------------+     Summary: RIGHT: - There is no evidence of deep vein thrombosis in the lower extremity.  - No cystic structure found in the popliteal fossa.  LEFT: - No evidence of common femoral vein obstruction.   *See table(s) above for measurements and observations. Electronically signed by Debby Robertson on 01/24/2024 at 7:42:47 PM.    Final    VAS US  UPPER EXTREMITY VENOUS DUPLEX Result Date: 01/24/2024 UPPER VENOUS STUDY  Patient Name:  LAQUENTIN LOUDERMILK  Date of Exam:   01/24/2024 Medical Rec #: [id number]         Accession #:    [id number] Date of Birth: 1928-09-26         Patient Gender: M Patient Age:   87 years [facility]am [facility] Procedure:      VAS US  UPPER EXTREMITY VENOUS DUPLEX Referring Phys: ABDULLAHI HUSSEIN --------------------------------------------------------------------------------  Indications: swollen, rule out DVT. Comparison Study: No prior exam. Performing Technologist: Edilia Elden Appl  Examination Guidelines: A complete evaluation includes B-mode imaging, spectral Doppler, color Doppler, and power Doppler as needed of all accessible portions of each vessel. Bilateral testing is considered an integral part of a complete examination. Limited examinations for reoccurring indications may be performed as noted.  Right Findings: +----------+------------+---------+-----------+----------+-------+ RIGHT     CompressiblePhasicitySpontaneousPropertiesSummary +----------+------------+---------+-----------+----------+-------+ IJV           Full       Yes       Yes                      +----------+------------+---------+-----------+----------+-------+ Subclavian    Full       Yes       Yes                       +----------+------------+---------+-----------+----------+-------+ Axillary      Full       Yes       Yes                      +----------+------------+---------+-----------+----------+-------+ Brachial      Full  Yes       Yes                      +----------+------------+---------+-----------+----------+-------+ Radial        Full                                          +----------+------------+---------+-----------+----------+-------+ Ulnar         Full                                          +----------+------------+---------+-----------+----------+-------+ Cephalic      None       No        No                       +----------+------------+---------+-----------+----------+-------+ Basilic       Full       Yes       Yes                      +----------+------------+---------+-----------+----------+-------+ Superficial vein thrombosis noted in the right cephalic vein from the shoulder to the antecubital fossa. All segments in the forearm are compressible.  Left Findings: +----------+------------+---------+-----------+----------+-------+ LEFT      CompressiblePhasicitySpontaneousPropertiesSummary +----------+------------+---------+-----------+----------+-------+ IJV           Full       Yes       Yes                      +----------+------------+---------+-----------+----------+-------+ Subclavian    Full       Yes       Yes                      +----------+------------+---------+-----------+----------+-------+  Summary:  Right: No evidence of deep vein thrombosis in the upper extremity. Findings consistent with acute superficial vein thrombosis involving the right cephalic vein.  Left: No evidence of thrombosis in the subclavian.  *See table(s) above for measurements and observations.  Diagnosing physician: Debby Robertson Electronically signed by Debby Robertson on 01/24/2024 at 4:08:52 PM.    Final       LOS: 6 days    Elgin Lam,  MD [facility] 01/25/2024, 3:29 PM   If 7PM-7AM, please contact night-coverage [email or web address]

## 2024-01-26 DIAGNOSIS — A419 Sepsis, unspecified organism: Secondary | ICD-10-CM | POA: Diagnosis not present

## 2024-01-26 DIAGNOSIS — R6521 Severe sepsis with septic shock: Secondary | ICD-10-CM | POA: Diagnosis not present

## 2024-01-26 LAB — BASIC METABOLIC PANEL WITH GFR
Anion gap: 9 (ref 5–15)
BUN: 20 mg/dL (ref 8–23)
CO2: 26 mmol/L (ref 22–32)
Calcium: 8.9 mg/dL (ref 8.9–10.3)
Chloride: 105 mmol/L (ref 98–111)
Creatinine, Ser: 0.87 mg/dL (ref 0.61–1.24)
GFR, Estimated: >60 mL/min (ref >60)
Glucose, Bld: 128 mg/dL — ABNORMAL HIGH (ref 70–99)
Potassium: 4.1 mmol/L (ref 3.5–5.1)
Sodium: 140 mmol/L (ref 135–145)

## 2024-01-26 LAB — CBC
HCT: 34.8 % — ABNORMAL LOW (ref 39.0–52.0)
Hemoglobin: 11.4 g/dL — ABNORMAL LOW (ref 13.0–17.0)
MCH: 30.6 pg (ref 26.0–34.0)
MCHC: 32.8 g/dL (ref 30.0–36.0)
MCV: 93.5 fL (ref 80.0–100.0)
Platelets: 136 K/uL — ABNORMAL LOW (ref 150–400)
RBC: 3.72 MIL/uL — ABNORMAL LOW (ref 4.22–5.81)
RDW: 15.4 % (ref 11.5–15.5)
WBC: 23 K/uL — ABNORMAL HIGH (ref 4.0–10.5)
nRBC: 0 % (ref 0.0–0.2)

## 2024-01-26 LAB — TSH: TSH: 4.501 u[IU]/mL — ABNORMAL HIGH (ref 0.350–4.500)

## 2024-01-26 LAB — T4, FREE: Free T4: 1.13 ng/dL — ABNORMAL HIGH (ref 0.61–1.12)

## 2024-01-26 MED ORDER — POLYETHYLENE GLYCOL 3350 17 G PO PACK
17.0000 g | PACK | Freq: Two times a day (BID) | ORAL | Status: DC
  Administered 2024-01-26 – 2024-01-27 (×2): 17 g via ORAL
  Filled 2024-01-26 (×2): qty 1

## 2024-01-26 MED ORDER — METOPROLOL SUCCINATE ER 25 MG PO TB24
37.5000 mg | ORAL_TABLET | Freq: Every day | ORAL | Status: DC
  Administered 2024-01-26: 37.5 mg via ORAL
  Filled 2024-01-26 (×3): qty 2

## 2024-01-26 MED ORDER — BISACODYL 10 MG RE SUPP
10.0000 mg | Freq: Once | RECTAL | Status: AC
  Administered 2024-01-27: 10 mg via RECTAL
  Filled 2024-01-26 (×2): qty 1

## 2024-01-26 NOTE — Progress Notes (Signed)
 Mobility Specialist: Progress Note   01/26/24 1434  Mobility  Activity Ambulated with assistance  Level of Assistance Contact guard assist, steadying assist  Assistive Device Front wheel walker  Distance Ambulated (ft) 120 ft  Activity Response Tolerated well  Mobility Referral Yes  Mobility visit 1 Mobility  Mobility Specialist Start Time (ACUTE ONLY) 0900  Mobility Specialist Stop Time (ACUTE ONLY) [id number]  Mobility Specialist Time Calculation (min) (ACUTE ONLY) 24 min    Pt received in bed, pleasant and agreeable to mobility session. ModA for bed mobility to assist scooting EOB, no physical assist needed to slide legs off. ModA for STS from EOB. MinG for ambulation. No complaints when asked. Ambulated down the hallway and returned to room without fault. Agreed to sit up chair for awhile. Left in chair with all needs met, call bell in reach. Wife, daughter, and son present and helpful.   Ileana Lute Mobility Specialist Please contact via SecureChat or Rehab office at [phone]

## 2024-01-26 NOTE — Progress Notes (Signed)
 Progress Note  Patient Name: Jerry [name] Date of Encounter: 01/26/2024  [facility] Cardiologist: None    Subjective   Has cough but otherwise denies any SOB or CP.  Remains in atrial fibrillation on tele but HR in low 100's.   Inpatient Medications    Scheduled Meds:  atorvastatin   40 mg Oral Daily   Chlorhexidine  Gluconate Cloth  6 each Topical Daily   famotidine   20 mg Oral Daily   feeding supplement  237 mL Oral TID BM   furosemide   40 mg Intravenous Daily   metoprolol  succinate  25 mg Oral Daily   multivitamin with minerals  1 tablet Oral Daily   polyethylene glycol  17 g Oral Daily   Ensure Max Protein  11 oz Oral Daily   senna-docusate  1 tablet Oral QHS   tamsulosin   0.4 mg Oral Daily   thiamine   100 mg Oral Daily   Continuous Infusions:  PRN Meds: acetaminophen , chlorpheniramine-HYDROcodone, docusate sodium , fentaNYL  (SUBLIMAZE ) injection, ipratropium-albuterol , melatonin, mouth rinse, phenol   Vital Signs    Vitals:   01/25/24 1519 01/25/24 2044 01/26/24 0500 01/26/24 0720  BP:  134/68 126/87 135/84  Pulse:  95 (!) 106 (!) 108  Resp:      Temp:  98.5 F (36.9 C) 98.5 F (36.9 C) 97.8 F (36.6 C)  TempSrc:  Oral Oral Oral  SpO2: 97% 95% 96% 93%  Weight:   72.5 kg   Height:        Intake/Output Summary (Last 24 hours) at 01/26/2024 0941 Last data filed at 01/25/2024 2044 Gross per 24 hour  Intake 360 ml  Output 3700 ml  Net -3340 ml      01/26/2024    5:00 AM 01/23/2024    5:00 AM 01/22/2024    5:00 AM  Last 3 Weights  Weight (lbs) 159 lb 13.3 oz 160 lb 15 oz 164 lb 7.4 oz  Weight (kg) 72.5 kg 73 kg 74.6 kg      Telemetry    Atrial fibrillation with HR in low 100's - Personally Reviewed  ECG    No new EKG to review - Personally Reviewed  Physical Exam   GEN: No acute distress.   Neck: No JVD Cardiac: irregularly irregular and tachy, no murmurs, rubs, or gallops.  Respiratory: crackles at bases bilaterally GI: Soft,  nontender, non-distended  MS: No edema; No deformity. Neuro:  Nonfocal  Psych: Normal affect   Labs    High Sensitivity Troponin:   Recent Labs  Lab 01/20/24 1402 01/21/24 0314  TROPONINIHS 147* 125*      Chemistry Recent Labs  Lab 01/20/24 1958 01/21/24 0314 01/21/24 1313 01/22/24 0341 01/23/24 0327 01/24/24 0822 01/26/24 0459  NA 134* 133*   < > 136 139 140 140  K 3.9 4.2   < > 3.9 4.1 4.2 4.1  CL 104 103   < > 105 107 110 105  CO2 16* 18*   < > 21* 24 24 26   GLUCOSE 162* 130*   < > 128* 121* 102* 128*  BUN 33* 38*   < > 44* 42* 27* 20  CREATININE 2.07* 2.16*   < > 1.50* 1.18 0.89 0.87  CALCIUM  7.3* 7.3*   < > 7.9* 8.0* 8.2* 8.9  PROT 4.7* 4.6*  --  5.0*  --   --   --   ALBUMIN  2.5* 2.4*  --  2.5*  --   --   --   AST  155* 140*  --  80*  --   --   --   ALT 131* 145*  --  145*  --   --   --   ALKPHOS 134* 125  --  168*  --   --   --   BILITOT 0.7 1.0  --  0.7  --   --   --   GFRNONAA 29* 28*   < > 43* 58* >60 >60  ANIONGAP 14 12   < > 10 8 6 9    < > = values in this interval not displayed.     Hematology Recent Labs  Lab 01/24/24 0822 01/25/24 0246 01/26/24 0459  WBC 26.6* 21.5* 23.0*  RBC 3.71* 3.60* 3.72*  HGB 11.5* 11.0* 11.4*  HCT 35.0* 34.0* 34.8*  MCV 94.3 94.4 93.5  MCH 31.0 30.6 30.6  MCHC 32.9 32.4 32.8  RDW 15.4 15.4 15.4  PLT 53* 79* 136*    BNP Recent Labs  Lab 01/19/24 1944 01/20/24 1130 01/22/24 0341  BNP 292.3* 1,086.3* 821.9*     DDimer  Recent Labs  Lab 01/20/24 1130  DDIMER >20.00*     CHA2DS2-VASc Score = 5   This indicates a 7.2% annual risk of stroke. The patient's score is based upon: CHF History: 1 HTN History: 1 Diabetes History: 0 Stroke History: 0 Vascular Disease History: 1 Age Score: 2 Gender Score: 0      Radiology    VAS US  LOWER EXTREMITY VENOUS (DVT) Result Date: 01/24/2024  Lower Venous DVT Study Patient Name:  Jerry [name]  Date of Exam:   01/24/2024 Medical Rec #: [id number]          Accession #:    [id number] Date of Birth: 05/01/1928         Patient Gender: M Patient Age:   21 years Exam Location:  [facility] Procedure:      VAS US  LOWER EXTREMITY VENOUS (DVT) Referring Phys: ABDULLAHI HUSSEIN --------------------------------------------------------------------------------  Indications: Swelling, and Edema.  Comparison Study: No prior exam. Performing Technologist: Edilia Elden Appl  Examination Guidelines: A complete evaluation includes B-mode imaging, spectral Doppler, color Doppler, and power Doppler as needed of all accessible portions of each vessel. Bilateral testing is considered an integral part of a complete examination. Limited examinations for reoccurring indications may be performed as noted. The reflux portion of the exam is performed with the patient in reverse Trendelenburg.  +---------+---------------+---------+-----------+----------+--------------+ RIGHT    CompressibilityPhasicitySpontaneityPropertiesThrombus Aging +---------+---------------+---------+-----------+----------+--------------+ CFV      Full           Yes      Yes                                 +---------+---------------+---------+-----------+----------+--------------+ SFJ      Full           Yes      Yes                                 +---------+---------------+---------+-----------+----------+--------------+ FV Prox  Full                                                        +---------+---------------+---------+-----------+----------+--------------+ FV  Mid   Full                                                        +---------+---------------+---------+-----------+----------+--------------+ FV DistalFull                                                        +---------+---------------+---------+-----------+----------+--------------+ PFV      Full                                                         +---------+---------------+---------+-----------+----------+--------------+ POP      Full           Yes      Yes                                 +---------+---------------+---------+-----------+----------+--------------+ PTV      Full                                                        +---------+---------------+---------+-----------+----------+--------------+ PERO     Full                                                        +---------+---------------+---------+-----------+----------+--------------+   +----+---------------+---------+-----------+----------+--------------+ LEFTCompressibilityPhasicitySpontaneityPropertiesThrombus Aging +----+---------------+---------+-----------+----------+--------------+ CFV Full           Yes      Yes                                 +----+---------------+---------+-----------+----------+--------------+ SFJ Full           Yes      Yes                                 +----+---------------+---------+-----------+----------+--------------+     Summary: RIGHT: - There is no evidence of deep vein thrombosis in the lower extremity.  - No cystic structure found in the popliteal fossa.  LEFT: - No evidence of common femoral vein obstruction.   *See table(s) above for measurements and observations. Electronically signed by Debby Robertson on 01/24/2024 at 7:42:47 PM.    Final    VAS US  UPPER EXTREMITY VENOUS DUPLEX Result Date: 01/24/2024 UPPER VENOUS STUDY  Patient Name:  DAVONNE JARNIGAN  Date of Exam:   01/24/2024 Medical Rec #: [id number]         Accession #:    [id number] Date of Birth: 01-30-1929         Patient Gender: M Patient Age:  89 years Exam Location:  [facility] Procedure:      VAS US  UPPER EXTREMITY VENOUS DUPLEX Referring Phys: ABDULLAHI HUSSEIN --------------------------------------------------------------------------------  Indications: swollen, rule out DVT. Comparison Study: No prior exam. Performing Technologist:  Edilia Elden Appl  Examination Guidelines: A complete evaluation includes B-mode imaging, spectral Doppler, color Doppler, and power Doppler as needed of all accessible portions of each vessel. Bilateral testing is considered an integral part of a complete examination. Limited examinations for reoccurring indications may be performed as noted.  Right Findings: +----------+------------+---------+-----------+----------+-------+ RIGHT     CompressiblePhasicitySpontaneousPropertiesSummary +----------+------------+---------+-----------+----------+-------+ IJV           Full       Yes       Yes                      +----------+------------+---------+-----------+----------+-------+ Subclavian    Full       Yes       Yes                      +----------+------------+---------+-----------+----------+-------+ Axillary      Full       Yes       Yes                      +----------+------------+---------+-----------+----------+-------+ Brachial      Full       Yes       Yes                      +----------+------------+---------+-----------+----------+-------+ Radial        Full                                          +----------+------------+---------+-----------+----------+-------+ Ulnar         Full                                          +----------+------------+---------+-----------+----------+-------+ Cephalic      None       No        No                       +----------+------------+---------+-----------+----------+-------+ Basilic       Full       Yes       Yes                      +----------+------------+---------+-----------+----------+-------+ Superficial vein thrombosis noted in the right cephalic vein from the shoulder to the antecubital fossa. All segments in the forearm are compressible.  Left Findings: +----------+------------+---------+-----------+----------+-------+ LEFT      CompressiblePhasicitySpontaneousPropertiesSummary  +----------+------------+---------+-----------+----------+-------+ IJV           Full       Yes       Yes                      +----------+------------+---------+-----------+----------+-------+ Subclavian    Full       Yes       Yes                      +----------+------------+---------+-----------+----------+-------+  Summary:  Right: No evidence of deep vein  thrombosis in the upper extremity. Findings consistent with acute superficial vein thrombosis involving the right cephalic vein.  Left: No evidence of thrombosis in the subclavian.  *See table(s) above for measurements and observations.  Diagnosing physician: Debby Robertson Electronically signed by Debby Robertson on 01/24/2024 at 4:08:52 PM.    Final     Patient Profile     89 y.o. male with a hx of AAA, hyperlipidemia, hypertension, MCTD, popliteal aneurysm status post stent 2024 and recent fall with subdural hemorrhage discharged from rehab 2 weeks ago  Who is being seen today for the evaluation of new onset atrial fibrillation and dilated cardiomyopathy at the request of Elgin Lam, MD.    Assessment & Plan    New onset atrial fibrillation with RVR Right bundle branch block - Noted to have irregular heartbeat on exam today and EKG confirmed atrial fibrillation with RVR - No history of A-fib in the past - Found to have dilated cardiomyopathy with EF 35 to 40% by echo several days ago and appears to be in CHF today which may have triggered A-fib or vice versa - CHA2DS2-VASc score is 5 but anticoagulation at this time is contraindicated due to recent subdural bleed requiring hospitalization and rehab as well as frequent falls - Start Toprol  XL 25 mg daily - Check TSH   Acute combined systolic/diastolic CHF Dilated cardiomyopathy Elevated troponin - Became hypotensive after his ureteral stent placement requiring pressor support - Co. ox at that time was low at 55% - 2D echo shows new dilated cardiomyopathy with EF 35 to 40%  and global HK with grade 1 diastolic dysfunction and RV dysfunction as well no significant valvular disease except for some very mild low-flow low gradient aortic stenosis -Hypotension has resolved and now off pressor support -unclear etiology of his new cardiomyopathy>> given history of PAD he is at high risk for having underlying CAD as well -High-sensitivity troponin elevated at 147 and trended down to 125.  2D echo with global HK and no focal wall motion abnormality -Given recent subdural hematoma, and other comorbidities he is a poor candidate for further ischemic workup at this time with cardiac cath and recommend medical management -started on Lasix  40mg  IV daily with good UOP: put out 3.7L yesterday and is net neg 2.6L since admit. -Weight down 1 lb from yesterday; SCr 0.87; K+ 4.1 -Increase Toprol  XL to 37.5mg  daily for better HR control -remains volume overloaded so continue on lasix  40mg  IV daily -Follow strict I's and O's, daily weights and renal function while diuresing -Once heart rate is adequately controlled we will add on GDMT with low-dose losartan and low-dose Spiro as BP and renal function allow - given advanced age and risk for UTIs would avoid SGLT2i   Septic shock Pyelonephritis with obstructive uropathy E. coli bacteremia - BP initially hypotensive and requiring pressor support but now off pressors - Status post bilateral ureteral stents 1129 with cystoscopy - IV antibiotics per [facility] and urology    I spent 30 minutes caring for this patient today face to face, ordering and reviewing labs, reviewing records from 2D echo  12/2023, seeing the patient, documenting in the record  For questions or updates, please contact [facility] Please consult [email or web address] for contact info under        Signed, Wilbert Bihari, MD  01/26/2024, 9:41 AM

## 2024-01-26 NOTE — PMR Pre-admission (Signed)
 PMR Admission Coordinator Pre-Admission Assessment  Patient: Jerry [name] is an 89 y.o., male MRN: [id number] DOB: Oct 08, 1928 Height: 5' 5 (165.1 cm) Weight: 70.8 kg  Insurance Information HMO:     PPO:      PCP:      IPA:      80/20:      OTHER:  PRIMARY: Medicare Part A and B      Policy#: [id number]      Subscriber: pt Benefits:  Phone #: passport one source online 12/3     Name:  Eff. Date: 02/22/1995     Deduct: $1676      Out of Pocket Max: none      Life Max: none CIR: 100%      SNF: 20 full days Outpatient: 80%     Co-Pay: 20% Home Health: 100%      Co-Pay: none DME: 80%     Co-Pay: 20% Providers: pt choice  SECONDARY: BCBS supplement      Policy#: [id number]     Phone#: [phone]  Financial Counselor:       Phone#:   The "Data Collection Information Summary" for patients in Inpatient Rehabilitation Facilities with attached "Privacy Act Statement-Health Care Records" was provided and verbally reviewed with: Patient and Family  Emergency Contact Information Contact Information     Name Relation Home Work Mobile   Jerry [name] Spouse [phone]  [phone]   Jerry [name] Blades   [phone]      Other Contacts     Name Relation Home Work Mobile   Jerry [name] Daughter   [phone]       Current Medical History  Patient Admitting Diagnosis: urosepsis, debility  History of Present Illness: Jerry [name] is a 89 year old right-handed male with history significant for AAA, anemia of chronic disease hearing loss, hypertension, hyperlipidemia, macular degeneration, popliteal aneurysm status post stenting by vascular surgery 2024 maintained on aspirin  and Plavix  per Dr. Selinda Gu, traumatic subdural hematoma after a fall as well as nondisplaced right clavicle fracture and received CIR 12/05/2023 - 12/19/2023 and was discharged to home with family requiring supervision for bed mobility and ambulates household distances with a rollator and arrangements  were made for home health therapies.   Presented 01/19/2024 after being found down after hearing a loud noise in the bathroom patient noted to have altered mental status.    In the ED patient was tachycardic as well as hypotensive with fever of 103 cranial CT scan showed no acute intracranial abnormality.  No skull fracture.  No mass effect or midline shift.  CT cervical spine negative.  CT of the chest abdomen and pelvis showed 2 mm calculus in the mid left ureter with mild obstructive uropathy.  Multiple healing/healed bilateral rib fractures.  There was question acute nondisplaced anterior right second rib fracture.  Age-indeterminate posterior left 10 th rib fracture.  Stable abdominal aortic aneurysm measuring 4 cm.  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  Admission chemistries unremarkable except CO2 16 glucose 120, creatinine 1.75, AST 54, WBC 14,900, hemoglobin 12.5, platelets 103,000, blood cultures E. coli, BNP 292.3, lactic acid 5.1-7.0, troponin elevated 147-125.  Patient was placed on broad-spectrum antibiotic with vancomycin  and Zosyn  for sepsis as well as receiving 2 L LR boluses, Duo Neb and Solu-Medrol .    Urology Dr. Norleen Seltzer consulted in regards to bilateral ureteral stones with left obstruction undergoing cystoscopy with bilateral retrograde pyelography and bilateral ureteral stents 01/20/2024.  Hospital course septic shock  secondary to acute pyelonephritis and obstructive uropathy with evidence of E. coli bacteremia.  Patient initially required ICU admission was started on dobutamine , vasopressin , phenylephrine  and norepinephrine .  Vasopressors weaned off with treatment of infection.  His empiric vancomycin  and Zosyn  transition to ceftriaxone  with leukocytosis initially peaked to 43,600 improved to 26,600-21,500.  His Foley tube remains in place mild hematuria and monitored with hemoglobin stable 11.5.   New onset HFrEF question etiology felt  troponin positive related to demand ischemia/sepsis.  Transthoracic echocardiogram significant for LVEF of 35 to 40% with global hypokinesis.  Patient did receive diuresis.  Lactic acidosis resolved with management of sepsis.  AKI complicated by mild hydronephrosis from ureteral obstruction improved with latest creatinine 0.89.  Incidental findings of right upper extremity SVT diagnosed 12/3 with supportive care.  Patient developed new onset atrial fibrillation 01/25/2024 with cardiology services follow-up he was placed on Toprol -XL 25 mg daily.  No anticoagulation at this time contraindicated due to recent subdural bleed.  In regards to patient's history of PAD managed on DAPT for stent x 2 SFA/popliteal artery antiplatelets held secondary to sepsis and thrombocytopenia with latest platelet count 53,000.  Pressure injury of right/mid/left sacrum with wound care as directed.  He is tolerating a regular consistency diet.    Patient's medical record from [facility] has been reviewed by the rehabilitation admission coordinator and physician.  Past Medical History  Past Medical History:  Diagnosis Date   AAA (abdominal aortic aneurysm)    Aneurysm    History of multiple vascular aneurysms. [name] involve the aorta, te iliac arteries, and the popliteal arteries.  The pt is s/p stent graft of an abdominal aortic aneurysm. All of his aneurysm follow up as been done at [facility].   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 This is manifested and is elevated, sed rate, pleuritis, Raynaud's pehnomenon and she does have a positive rheumatoid factor.  He is being treated with Plquenil for his collagen vascular disease   Colonic polyp    History of nephrolithiasis    Hyperlipidemia    Hypertension    Macular degeneration    Osteoarthrosis, hip    Left hip   Paget's disease    Has the patient had major surgery during 100 days prior to admission? Yes  Family History    family history includes Alzheimer's disease in his father; Heart attack (age of onset: 33) in his mother.  Current Medications  Current Facility-Administered Medications:    acetaminophen  (TYLENOL ) tablet 650 mg, 650 mg, Oral, Q6H PRN, Briana Elgin LABOR, MD, 650 mg at 01/29/24 1142   amiodarone  (PACERONE ) tablet 200 mg, 200 mg, Oral, BID **FOLLOWED BY** [START ON 02/12/2024] amiodarone  (PACERONE ) tablet 200 mg, 200 mg, Oral, Daily, Kate Lonni CROME, MD   aspirin  EC tablet 81 mg, 81 mg, Oral, Daily, Briana Elgin LABOR, MD, 81 mg at 01/29/24 1017   atorvastatin  (LIPITOR) tablet 40 mg, 40 mg, Oral, Daily, Albustami, Omar M, MD, 40 mg at 01/29/24 1016   Chlorhexidine  Gluconate Cloth 2 % PADS 6 each, 6 each, Topical, Daily, Adolph Tinnie BRAVO, PA-C, 6 each at 01/29/24 1024   chlorpheniramine-HYDROcodone (TUSSIONEX) 10-8 MG/5ML suspension 5 mL, 5 mL, Oral, QHS PRN, Briana Elgin LABOR, MD, 5 mL at 01/24/24 2041   clopidogrel  (PLAVIX ) tablet 75 mg, 75 mg, Oral, Daily, Briana Elgin LABOR, MD, 75 mg at 01/29/24 1016   docusate sodium  (COLACE) capsule 100 mg, 100 mg, Oral, BID PRN, Adolph Tinnie  E, PA-C, 100 mg at 01/26/24 1054   famotidine  (PEPCID ) tablet 20 mg, 20 mg, Oral, Daily, Hussein, Lenny, MD, 20 mg at 01/29/24 1016   feeding supplement (ENSURE PLUS HIGH PROTEIN) liquid 237 mL, 237 mL, Oral, TID BM, Ramaswamy, Murali, MD, 237 mL at 01/29/24 1024   fentaNYL  (SUBLIMAZE ) injection 12.5-25 mcg, 12.5-25 mcg, Intravenous, Q2H PRN, Ramaswamy, Murali, MD, 25 mcg at 01/21/24 0342   gabapentin  (NEURONTIN ) capsule 100 mg, 100 mg, Oral, TID, Briana Elgin LABOR, MD, 100 mg at 01/29/24 1141   ipratropium-albuterol  (DUONEB) 0.5-2.5 (3) MG/3ML nebulizer solution 3 mL, 3 mL, Nebulization, Q4H PRN, Briana Elgin LABOR, MD   melatonin tablet 5 mg, 5 mg, Oral, QHS PRN, Paliwal, Aditya, MD, 5 mg at 01/23/24 2108   metoprolol  succinate (TOPROL -XL) 24 hr tablet 50 mg, 50 mg, Oral, Daily, Turner, Traci R, MD, 50 mg at 01/29/24  1016   multivitamin with minerals tablet 1 tablet, 1 tablet, Oral, Daily, Albustami, Omar M, MD, 1 tablet at 01/29/24 1017   Oral care mouth rinse, 15 mL, Mouth Rinse, PRN, Albustami, Omar M, MD   phenol (CHLORASEPTIC) mouth spray 1 spray, 1 spray, Mouth/Throat, PRN, Sharie Lenny, MD, 1 spray at 01/26/24 1044   polyethylene glycol (MIRALAX  / GLYCOLAX ) packet 17 g, 17 g, Oral, Daily PRN, Briana Elgin LABOR, MD   protein supplement (ENSURE MAX) liquid, 11 oz, Oral, Daily, Angiulli, Toribio PARAS, PA-C, 11 oz at 01/29/24 1024   senna-docusate (Senokot-S) tablet 1 tablet, 1 tablet, Oral, QHS, Albustami, Mancel HERO, MD, 1 tablet at 01/26/24 2034   tamsulosin  (FLOMAX ) capsule 0.4 mg, 0.4 mg, Oral, Daily, Briana Elgin LABOR, MD, 0.4 mg at 01/29/24 1016  Patients Current Diet:  Diet Order             Diet 2 gram sodium Room service appropriate? Yes; Fluid consistency: Thin; Fluid restriction: 1500 mL Fluid  Diet effective now                   Precautions / Restrictions Precautions Precautions: Fall Precaution/Restrictions Comments: watch HR Restrictions Weight Bearing Restrictions Per Provider Order: Yes RUE Weight Bearing Per Provider Order: Weight bearing as tolerated Other Position/Activity Restrictions: NWB RUE from fx R clavicle 7.5 weeks ago, Dr Sharie Attending agreeable to progress to [facility] and to note any pain   Has the patient had 2 or more falls or a fall with injury in the past year? Yes  Prior Activity Level Community (5-7x/wk): was Mod I RW and Supervision on stairs after CIR 10/25  Prior Functional Level Self Care: Did the patient need help bathing, dressing, using the toilet or eating? Needed some help  Indoor Mobility: Did the patient need assistance with walking from room to room (with or without device)? Independent  Stairs: Did the patient need assistance with internal or external stairs (with or without device)? Needed some help  Functional Cognition: Did the patient  need help planning regular tasks such as shopping or remembering to take medications? Needed some help  Patient Information Are you of Hispanic, Latino/a,or Spanish origin?: A. No, not of Hispanic, Latino/a, or Spanish origin What is your race?: A. White Do you need or want an interpreter to communicate with a doctor or health care staff?: 0. No  Patient's Response To:  Health Literacy and Transportation Is the patient able to respond to health literacy and transportation needs?: Yes Health Literacy - How often do you need to have someone help you when you read instructions, pamphlets,  or other written material from your doctor or pharmacy?: Rarely In the past 12 months, has lack of transportation kept you from medical appointments or from getting medications?: No In the past 12 months, has lack of transportation kept you from meetings, work, or from getting things needed for daily living?: No  Home Assistive Devices / Equipment Home Equipment: Grab bars - tub/shower, Agricultural Consultant (2 wheels), Toilet riser, Other (comment), Wheelchair - manual, Rollator (4 wheels), Shower seat, Hand held shower head (hurrycane; urinal; bed rails)  Prior Device Use: Indicate devices/aids used by the patient prior to current illness, exacerbation or injury? Walker  Current Functional Level Cognition  Orientation Level: Oriented to person, Oriented to place, Oriented to situation, Disoriented to time    Extremity Assessment (includes Sensation/Coordination)  Upper Extremity Assessment: Generalized weakness, RUE deficits/detail RUE Deficits / Details: recent clavicle fx with noted decr shoulder ROM with reaching tasks in standing 0-60 degrees  Lower Extremity Assessment: Defer to PT evaluation    ADLs  Overall ADL's : Needs assistance/impaired Eating/Feeding: Set up, Supervision/ safety, Sitting Eating/Feeding Details (indicate cue type and reason): in recliner Grooming: Set up, Sitting,  Supervision/safety Grooming Details (indicate cue type and reason): EOB without spinal support Upper Body Bathing: Set up, Supervision/ safety, Sitting Upper Body Bathing Details (indicate cue type and reason): in recliner Lower Body Bathing: Maximal assistance Lower Body Bathing Details (indicate cue type and reason): Mod A +2 sit<>stand Upper Body Dressing : Moderate assistance, Sitting Upper Body Dressing Details (indicate cue type and reason): in recliner Lower Body Dressing: Maximal assistance Lower Body Dressing Details (indicate cue type and reason): Mod A +2 sit<>stand Toilet Transfer: Minimal assistance, Ambulation, Rolling walker (2 wheels), Contact guard assist Toilet Transfer Details (indicate cue type and reason): one handed A from bed to recliner with +2 for safety/A Toileting- Clothing Manipulation and Hygiene: Maximal assistance Toileting - Clothing Manipulation Details (indicate cue type and reason): Mod A +2 sit<>stand Functional mobility during ADLs: Minimal assistance, Rolling walker (2 wheels)    Mobility  Overal bed mobility: Needs Assistance Bed Mobility: Supine to Sit Supine to sit: Min assist Sit to supine: Min assist General bed mobility comments: Verbal cues for execution, i.e. reaching over for rail. MinA with bed pad to shift R hip out to edge of bed    Transfers  Overall transfer level: Needs assistance Equipment used: Rolling walker (2 wheels) Transfers: Sit to/from Stand Sit to Stand: Min assist, Contact guard assist Bed to/from chair/wheelchair/BSC transfer type:: Step pivot Step pivot transfers: Min assist General transfer comment: CGA from edge of bed, minA from lower toilet height surface. Verbal cues for hand placement. noted decreased eccentric control    Ambulation / Gait / Stairs / Wheelchair Mobility  Ambulation/Gait Ambulation/Gait assistance: Min assist, Contact guard assist Gait Distance (Feet): 150 Feet Assistive device: Rolling walker  (2 wheels) Gait Pattern/deviations: Decreased stride length, Trunk flexed, Shuffle General Gait Details: Shuffling gait pattern with crouched posture, intermittent minA for obstacle negotiation and verbal cues for environmental navigation Gait velocity: reduced Gait velocity interpretation: <1.8 ft/sec, indicate of risk for recurrent falls    Posture / Balance Dynamic Sitting Balance Sitting balance - Comments: right lateral lean, VCs to correct and pt unaware Balance Overall balance assessment: Needs assistance Sitting-balance support: Single extremity supported, Feet supported Sitting balance-Leahy Scale: Good Sitting balance - Comments: right lateral lean, VCs to correct and pt unaware Standing balance support: Bilateral upper extremity supported Standing balance-Leahy Scale: Poor Standing balance comment: reliant  on external support    Special considerations/life events  Fall precautions WOC consulted 12/2 due to sacrum Dr Watt placed double lumen latex 16 fr cath on 11/28 Left and right ureter 6 fr stents placed on 11/28       [facility] Previous Home Environment  Living Arrangements: Spouse/significant other  Lives With: Spouse Available Help at Discharge: Family, Available 24 hours/day Type of Home: House Home Layout: Two level, Able to live on main level with bedroom/bathroom Alternate Level Stairs-Number of Steps: flight Home Access: Stairs to enter Entrance Stairs-Rails: Left Entrance Stairs-Number of Steps: 3 Bathroom Shower/Tub: Psychologist, counselling, Sport And Exercise Psychologist: Standard Bathroom Accessibility: Yes How Accessible: Accessible via walker Home Care Services: Yes Type of Home Care Services: Home OT, Home PT Home Care Agency (if known): [facility] 10/25 Additional Comments: CIR admit 10/25, home at overall supervision level after 13 days  Discharge Living Setting Plans for Discharge Living Setting: Patient's home, House, Lives with (comment) (wife) Type of Home at  Discharge: House Discharge Home Layout: Two level, Able to live on main level with bedroom/bathroom Alternate Level Stairs-Number of Steps: flight Discharge Home Access: Stairs to enter Entrance Stairs-Rails: Left Entrance Stairs-Number of Steps: 3 Discharge Bathroom Shower/Tub: Walk-in shower, Door Discharge Bathroom Toilet: Standard Discharge Bathroom Accessibility: Yes How Accessible: Accessible via walker Does the patient have any problems obtaining your medications?: No  Social/Family/Support Systems Patient Roles: Spouse, Parent Contact Information: wife, Glenda Anticipated Caregiver: wife, daughter Anticipated Industrial/product Designer Information: see contacts Ability/Limitations of Caregiver: no limitations notied Caregiver Availability: 24/7 Discharge Plan Discussed with Primary Caregiver: Yes Is Caregiver In Agreement with Plan?: Yes Does Caregiver/Family have Issues with Lodging/Transportation while Pt is in Rehab?: No  Goals Patient/Family Goal for Rehab: supervision PT, supervision to min OT Expected length of stay: ELOS 10 to 14 days Additional Information: CIR admit 10/25 for 13 days and went home at overall supervision level Pt/Family Agrees to Admission and willing to participate: Yes Program Orientation Provided & Reviewed with Pt/Caregiver Including Roles  & Responsibilities: Yes  Decrease burden of Care through IP rehab admission: n/a  Possible need for SNF placement upon discharge: not anticipated  Patient Condition: I have reviewed medical records from [facility], spoken with  patient, spouse, son, and daughter. I met with patient at the bedside for inpatient rehabilitation assessment.  Patient will benefit from ongoing PT and OT, can actively participate in 3 hours of therapy a day 5 days of the week, and can make measurable gains during the admission.  Patient will also benefit from the coordinated team approach during an Inpatient Acute Rehabilitation  admission.  The patient will receive intensive therapy as well as Rehabilitation physician, nursing, social worker, and care management interventions.  Due to bladder management, bowel management, safety, skin/wound care, disease management, medication administration, pain management, and patient education the patient requires 24 hour a day rehabilitation nursing.  The patient is currently CGA-Min A with mobility and basic ADLs.  Discharge setting and therapy post discharge at home with home health is anticipated.  Patient has agreed to participate in the Acute Inpatient Rehabilitation Program and will admit today.  Preadmission Screen Completed By:  Alison Heron Chrys OBIE MSN 01/29/2024 1:43 PM ______________________________________________________________________   Discussed status with Dr. Babs on 01/29/24  at 1:43 PM and received approval for admission today.  Admission Coordinator:  Alison Heron Chrys, RN MSN time 1:43 PM/Date 01/29/24    Assessment/Plan: Diagnosis: debility due to urosepsis Does the need for close, 24 hr/day  Medical supervision in concert with the patient's rehab needs make it unreasonable for this patient to be served in a less intensive setting? Yes Co-Morbidities requiring supervision/potential complications: AAA, ACD, HTN, mac degeneration, obstructive uropathy Due to bladder management, bowel management, safety, skin/wound care, disease management, medication administration, pain management, and patient education, does the patient require 24 hr/day rehab nursing? Yes Does the patient require coordinated care of a physician, rehab nurse, PT, OT, and SLP to address physical and functional deficits in the context of the above medical diagnosis(es)? Yes Addressing deficits in the following areas: balance, endurance, locomotion, strength, transferring, bowel/bladder control, bathing, dressing, feeding, grooming, toileting, and psychosocial support Can the patient  actively participate in an intensive therapy program of at least 3 hrs of therapy 5 days a week? Yes The potential for patient to make measurable gains while on inpatient rehab is excellent Anticipated functional outcomes upon discharge from inpatient rehab: supervision PT, supervision and min assist OT, n/a SLP Estimated rehab length of stay to reach the above functional goals is: 10-14 days Anticipated discharge destination: Home 10. Overall Rehab/Functional Prognosis: excellent   MD Signature: Arthea IVAR Gunther, MD, [facility] Physical Medicine & Rehabilitation Medical Director Rehabilitation Services 01/29/2024

## 2024-01-26 NOTE — Progress Notes (Signed)
 PROGRESS NOTE    Jerry [name]  FMW:[id number] DOB: 14-Dec-1928 DOA: 01/19/2024 PCP: [name] ORN, MD   Brief Narrative: Jerry [name] is a 89 y.o. male with a history of AAA, popliteal aneurysm s/p stent and DAPT, hyperlipidemia, hypertension, subdural hematoma, hyperlipidemia.  Patient presented secondary to a fall and delirium, found to have evidence of septic shock secondary to obstructive uropathy, in addition to pyelonephritis and bacteremia. Patient required ICU admission and intubation. Urology consulted and performed urgent bilateral ureteral stent placements. Symptoms improving with treatment.   Assessment and Plan:  Septic shock Present on admission. Secondary to acute pyelonephritis and obstructive uropathy with evidence of E. Coli bacteremia. Patient started empirically on Vancomycin  and Zosyn  initially and transitioned to Ceftriaxone  IV. Patient required ICU admission and was started on dobutamine , vasopressin , phenylephrine  and norepinephrine . Vasopressors weaned off with treatment of infection and ureteral obstruction.  Acute pyelonephritis E. Coli bacteremia Secondary to left ureter obstruction. Urine culture significant for E. Coli. Blood culture also with E. Coli. Empiric Vancomycin  and Zosyn  transitioned to Ceftriaxone  for management. -Continue Ceftriaxone  IV, will treat for 10 days  Bilateral ureteral stones Left ureter obstruction Urology consulted and performed cystoscopy with insertion of bilateral ureteral stents on 11/29. Per urology, patient can have foley catheter removed. -Will discontinue foley catheter once Lasix  IV diuresis stopped  Acute metabolic encephalopathy Secondary to sepsis and acute infection. Resolved with treatment.  New onset HFrEF Unclear etiology, but patient does have a history of PAD, so ischemia is a possible etiology. Could also be related to sepsis.  Troponin elevated at 147 with downtrend to 125. Transthoracic  Echocardiogram significant for an LVEF of 35-40% with global hypokinesis. Patient managed, shortly, with Lasix . Cardiology consulted and have restarted Lasix  IV diuresis -Cardiology recommendations: Lasix  IV, Toprol  XL -Daily weights/strict in/out -Cardiac monitoring  Atrial fibrillation with RVR Noted to have an irregular rhythm on exam. EKG obtained and confirms atrial fibrillation. Complicated by low LVEF. Cardiology consulted. Not a candidate for anticoagulation per cardiology secondary to fall/SDH history. -Cardiology recommendations: metoprolol  -Cardiac monitoring  Acute respiratory failure with hypoxia Patient with respiratory distress and hypoxia secondary to COPD vs heart failure prior to admission to ICU. Resolved.  Lactic acidosis Lactic acid of 4.1 on admission. Resolved with management of sepsis.  AKI Baseline creatinine of around 0.9. Creatinine of 1.75 with peak of 2.16. Complicated by mild hydronephrosis from ureteral obstruction. Creatinine improved. AKI resolved.   Elevated TSH -Check free T4  Right upper extremity SVT Diagnosed on 12/3. -Supportive care  PAD Patient managed on DAPT for stent x2 at SFA/popliteal artery. Antiplatelets held on admission secondary to sepsis and thrombocytopenia. Patient with atrial fibrillation, however per cardiology, unlikely he will be started on anticoagulation. -Likely restart aspirin  and Plavix ; will touch base with cardiology  Thrombocytopenia Likely secondary to acute infection. Platelets down to a low of 30,000. Improved to 79,000.  Cough Likely related to recent intubation. Clear production. -Tussionex at bedtime as needed  History of subdural hematoma Noted. No evidence of hematoma on CT head obtained this admission.  AAA Noted.  Cholelithiasis Noted incidentally on CT imaging. Asymptomatic.  Bilateral rib fractures Noted. No evidence for acute fracture.  Aortic atherosclerosis Noted.  Moderate  malnutrition -Dietitian recommendations (12/2): Liberalize diet to regular in the setting of advanced age Ensure Plus High Protein po BID, each supplement provides 350 kcal and 20 grams of protein. Magic cup TID with meals, each supplement provides 290 kcal and 9 grams of protein  Continue MVI with minerals daily  Constipation -Continue MiraLAX  and Colace -Dulcolax suppository  Pressure injury Right/mid/left sacrum. Unclear if present on admission.   DVT prophylaxis: SCDs Code Status:   Code Status: Limited: Do not attempt resuscitation (DNR) -DNR-LIMITED -Do Not Intubate/DNI  Family Communication: Wife and son at bedside Disposition Plan: Discharge possibly to acute inpatient rehabilitation likely in 2-3 days pending improvement of atrial fibrillation and heart failure   Consultants:  PCCM Urology  Procedures:  Transthoracic Echocardiogram Cystoscopy with insertion of bilateral ureteral stents.   Antimicrobials: Vancomycin  Zosyn  Ceftriaxone     Subjective: Continued cough. Also with hiccups. Still no bowel movement. No other concerns.  Objective: BP 116/62 (BP Location: Left Arm)   Pulse 100   Temp 97.9 F (36.6 C) (Oral)   Resp 16   Ht 5' 5 (1.651 m)   Wt 72.5 kg   SpO2 97%   BMI 26.60 kg/m   Examination:  General exam: Appears calm and comfortable. Respiratory system: Clear to auscultation. Respiratory effort normal. Cardiovascular system: S1 & S2 heard, irregular rhythm, normal rate. Gastrointestinal system: Abdomen is nondistended, soft and nontender. Normal bowel sounds heard. Central nervous system: Alert Musculoskeletal: No calf tenderness, 1+ pitting edema   Data Reviewed: I have personally reviewed following labs and imaging studies  CBC Lab Results  Component Value Date   WBC 23.0 (H) 01/26/2024   RBC 3.72 (L) 01/26/2024   HGB 11.4 (L) 01/26/2024   HCT 34.8 (L) 01/26/2024   MCV 93.5 01/26/2024   MCH 30.6 01/26/2024   PLT 136 (L)  01/26/2024   MCHC 32.8 01/26/2024   RDW 15.4 01/26/2024   LYMPHSABS 0.1 (L) 01/19/2024   MONOABS 0.1 01/19/2024   EOSABS 0.0 01/19/2024   BASOSABS 0.0 01/19/2024     Last metabolic panel Lab Results  Component Value Date   NA 140 01/26/2024   K 4.1 01/26/2024   CL 105 01/26/2024   CO2 26 01/26/2024   BUN 20 01/26/2024   CREATININE 0.87 01/26/2024   GLUCOSE 128 (H) 01/26/2024   GFRNONAA >60 01/26/2024   GFRAA >60 10/17/2018   CALCIUM  8.9 01/26/2024   PHOS 2.5 01/23/2024   PROT 5.0 (L) 01/22/2024   ALBUMIN  2.5 (L) 01/22/2024   BILITOT 0.7 01/22/2024   ALKPHOS 168 (H) 01/22/2024   AST 80 (H) 01/22/2024   ALT 145 (H) 01/22/2024   ANIONGAP 9 01/26/2024    GFR: Estimated Creatinine Clearance: 46.1 mL/min (by C-G formula based on SCr of 0.87 mg/dL).  Recent Results (from the past 240 hours)  Urine Culture     Status: Abnormal   Collection Time: 01/19/24  7:34 PM   Specimen: Urine, Random  Result Value Ref Range Status   Specimen Description URINE, RANDOM  Final   Special Requests   Final    NONE Reflexed from [id number] Performed at [facility], [address], [place], [place] [address]    Culture >=100,000 COLONIES/mL ESCHERICHIA COLI (A)  Final   Report Status 01/21/2024 FINAL  Final   Organism ID, Bacteria ESCHERICHIA COLI (A)  Final      Susceptibility   Escherichia coli - MIC*    AMPICILLIN 8 SENSITIVE Sensitive     CEFAZOLIN  (URINE) Value in next row Sensitive      2 SENSITIVEThis is a modified FDA-approved test that has been validated and its performance characteristics determined by the reporting laboratory.  This laboratory is certified under the Clinical Laboratory Improvement Amendments CLIA as qualified to perform high  complexity clinical laboratory testing.    CEFEPIME Value in next row Sensitive      2 SENSITIVEThis is a modified FDA-approved test that has been validated and its performance characteristics determined by the reporting laboratory.  This  laboratory is certified under the Clinical Laboratory Improvement Amendments CLIA as qualified to perform high complexity clinical laboratory testing.    ERTAPENEM Value in next row Sensitive      2 SENSITIVEThis is a modified FDA-approved test that has been validated and its performance characteristics determined by the reporting laboratory.  This laboratory is certified under the Clinical Laboratory Improvement Amendments CLIA as qualified to perform high complexity clinical laboratory testing.    CEFTRIAXONE  Value in next row Sensitive      2 SENSITIVEThis is a modified FDA-approved test that has been validated and its performance characteristics determined by the reporting laboratory.  This laboratory is certified under the Clinical Laboratory Improvement Amendments CLIA as qualified to perform high complexity clinical laboratory testing.    CIPROFLOXACIN Value in next row Sensitive      2 SENSITIVEThis is a modified FDA-approved test that has been validated and its performance characteristics determined by the reporting laboratory.  This laboratory is certified under the Clinical Laboratory Improvement Amendments CLIA as qualified to perform high complexity clinical laboratory testing.    GENTAMICIN Value in next row Sensitive      2 SENSITIVEThis is a modified FDA-approved test that has been validated and its performance characteristics determined by the reporting laboratory.  This laboratory is certified under the Clinical Laboratory Improvement Amendments CLIA as qualified to perform high complexity clinical laboratory testing.    NITROFURANTOIN Value in next row Sensitive      2 SENSITIVEThis is a modified FDA-approved test that has been validated and its performance characteristics determined by the reporting laboratory.  This laboratory is certified under the Clinical Laboratory Improvement Amendments CLIA as qualified to perform high complexity clinical laboratory testing.    TRIMETH/SULFA  Value in next row Sensitive      2 SENSITIVEThis is a modified FDA-approved test that has been validated and its performance characteristics determined by the reporting laboratory.  This laboratory is certified under the Clinical Laboratory Improvement Amendments CLIA as qualified to perform high complexity clinical laboratory testing.    AMPICILLIN/SULBACTAM Value in next row Sensitive      2 SENSITIVEThis is a modified FDA-approved test that has been validated and its performance characteristics determined by the reporting laboratory.  This laboratory is certified under the Clinical Laboratory Improvement Amendments CLIA as qualified to perform high complexity clinical laboratory testing.    PIP/TAZO Value in next row Sensitive      <=4 SENSITIVEThis is a modified FDA-approved test that has been validated and its performance characteristics determined by the reporting laboratory.  This laboratory is certified under the Clinical Laboratory Improvement Amendments CLIA as qualified to perform high complexity clinical laboratory testing.    MEROPENEM Value in next row Sensitive      <=4 SENSITIVEThis is a modified FDA-approved test that has been validated and its performance characteristics determined by the reporting laboratory.  This laboratory is certified under the Clinical Laboratory Improvement Amendments CLIA as qualified to perform high complexity clinical laboratory testing.    * >=100,000 COLONIES/mL ESCHERICHIA COLI  Blood Culture (routine x 2)     Status: Abnormal   Collection Time: 01/19/24  7:44 PM   Specimen: BLOOD  Result Value Ref Range Status   Specimen Description BLOOD SITE  NOT SPECIFIED  Final   Special Requests   Final    BOTTLES DRAWN AEROBIC AND ANAEROBIC Blood Culture adequate volume   Culture  Setup Time   Final    GRAM NEGATIVE RODS IN BOTH AEROBIC AND ANAEROBIC BOTTLES CRITICAL RESULT CALLED TO, READ BACK BY AND VERIFIED WITH: PHARMD J LEDFORD 01/20/2024 @ 0622 BY  AB Performed at [facility], [address] [place], [place] [address]    Culture ESCHERICHIA COLI (A)  Final   Report Status 01/22/2024 FINAL  Final   Organism ID, Bacteria ESCHERICHIA COLI  Final      Susceptibility   Escherichia coli - MIC*    AMPICILLIN 8 SENSITIVE Sensitive     CEFAZOLIN  (NON-URINE) 4 INTERMEDIATE Intermediate     CEFEPIME <=0.12 SENSITIVE Sensitive     ERTAPENEM <=0.12 SENSITIVE Sensitive     CEFTRIAXONE  <=0.25 SENSITIVE Sensitive     CIPROFLOXACIN <=0.06 SENSITIVE Sensitive     GENTAMICIN <=1 SENSITIVE Sensitive     MEROPENEM <=0.25 SENSITIVE Sensitive     TRIMETH/SULFA <=20 SENSITIVE Sensitive     AMPICILLIN/SULBACTAM <=2 SENSITIVE Sensitive     PIP/TAZO Value in next row Sensitive      <=4 SENSITIVEThis is a modified FDA-approved test that has been validated and its performance characteristics determined by the reporting laboratory.  This laboratory is certified under the Clinical Laboratory Improvement Amendments CLIA as qualified to perform high complexity clinical laboratory testing.    * ESCHERICHIA COLI  Blood Culture ID Panel (Reflexed)     Status: Abnormal   Collection Time: 01/19/24  7:44 PM  Result Value Ref Range Status   Enterococcus faecalis NOT DETECTED NOT DETECTED Final   Enterococcus Faecium NOT DETECTED NOT DETECTED Final   Listeria monocytogenes NOT DETECTED NOT DETECTED Final   Staphylococcus species NOT DETECTED NOT DETECTED Final   Staphylococcus aureus (BCID) NOT DETECTED NOT DETECTED Final   Staphylococcus epidermidis NOT DETECTED NOT DETECTED Final   Staphylococcus lugdunensis NOT DETECTED NOT DETECTED Final   Streptococcus species NOT DETECTED NOT DETECTED Final   Streptococcus agalactiae NOT DETECTED NOT DETECTED Final   Streptococcus pneumoniae NOT DETECTED NOT DETECTED Final   Streptococcus pyogenes NOT DETECTED NOT DETECTED Final   A.calcoaceticus-baumannii NOT DETECTED NOT DETECTED Final   Bacteroides fragilis NOT  DETECTED NOT DETECTED Final   Enterobacterales DETECTED (A) NOT DETECTED Final    Comment: Enterobacterales represent a large order of gram negative bacteria, not a single organism. CRITICAL RESULT CALLED TO, READ BACK BY AND VERIFIED WITH: PHARMD J LEDFORD 01/20/2024 @ 0622 BY AB    Enterobacter cloacae complex NOT DETECTED NOT DETECTED Final   Escherichia coli DETECTED (A) NOT DETECTED Final    Comment: CRITICAL RESULT CALLED TO, READ BACK BY AND VERIFIED WITH: PHARMD J LEDFORD 01/20/2024 @ 0622 BY AB    Klebsiella aerogenes NOT DETECTED NOT DETECTED Final   Klebsiella oxytoca NOT DETECTED NOT DETECTED Final   Klebsiella pneumoniae NOT DETECTED NOT DETECTED Final   Proteus species NOT DETECTED NOT DETECTED Final   Salmonella species NOT DETECTED NOT DETECTED Final   Serratia marcescens NOT DETECTED NOT DETECTED Final   Haemophilus influenzae NOT DETECTED NOT DETECTED Final   Neisseria meningitidis NOT DETECTED NOT DETECTED Final   Pseudomonas aeruginosa NOT DETECTED NOT DETECTED Final   Stenotrophomonas maltophilia NOT DETECTED NOT DETECTED Final   Candida albicans NOT DETECTED NOT DETECTED Final   Candida auris NOT DETECTED NOT DETECTED Final   Candida glabrata NOT DETECTED  NOT DETECTED Final   Candida krusei NOT DETECTED NOT DETECTED Final   Candida parapsilosis NOT DETECTED NOT DETECTED Final   Candida tropicalis NOT DETECTED NOT DETECTED Final   Cryptococcus neoformans/gattii NOT DETECTED NOT DETECTED Final   CTX-M ESBL NOT DETECTED NOT DETECTED Final   Carbapenem resistance IMP NOT DETECTED NOT DETECTED Final   Carbapenem resistance KPC NOT DETECTED NOT DETECTED Final   Carbapenem resistance NDM NOT DETECTED NOT DETECTED Final   Carbapenem resist OXA 48 LIKE NOT DETECTED NOT DETECTED Final   Carbapenem resistance VIM NOT DETECTED NOT DETECTED Final    Comment: Performed at [facility], [address] [place], [place] [address]  Blood Culture (routine x 2)     Status:  Abnormal   Collection Time: 01/19/24  8:26 PM   Specimen: BLOOD  Result Value Ref Range Status   Specimen Description BLOOD SITE NOT SPECIFIED  Final   Special Requests   Final    BOTTLES DRAWN AEROBIC AND ANAEROBIC Blood Culture adequate volume   Culture  Setup Time   Final    GRAM NEGATIVE RODS IN BOTH AEROBIC AND ANAEROBIC BOTTLES CRITICAL VALUE NOTED.  VALUE IS CONSISTENT WITH PREVIOUSLY REPORTED AND CALLED VALUE.    Culture (A)  Final    ESCHERICHIA COLI SUSCEPTIBILITIES PERFORMED ON PREVIOUS CULTURE WITHIN THE LAST 5 DAYS. Performed at [facility], [address] [place], [place] [address]    Report Status 01/22/2024 FINAL  Final  Resp panel by RT-PCR (RSV, Flu A&B, Covid) Anterior Nasal Swab     Status: None   Collection Time: 01/19/24  8:29 PM   Specimen: Anterior Nasal Swab  Result Value Ref Range Status   SARS Coronavirus 2 by RT PCR NEGATIVE NEGATIVE Final   Influenza A by PCR NEGATIVE NEGATIVE Final   Influenza B by PCR NEGATIVE NEGATIVE Final    Comment: (NOTE) The Xpert Xpress SARS-CoV-2/FLU/RSV plus assay is intended as an aid in the diagnosis of influenza from Nasopharyngeal swab specimens and should not be used as a sole basis for treatment. Nasal washings and aspirates are unacceptable for Xpert Xpress SARS-CoV-2/FLU/RSV testing.  Fact Sheet for Patients: bloggercourse.com  Fact Sheet for Healthcare Providers: seriousbroker.it  This test is not yet approved or cleared by the [place]  FDA and has been authorized for detection and/or diagnosis of SARS-CoV-2 by FDA under an Emergency Use Authorization (EUA). This EUA will remain in effect (meaning this test can be used) for the duration of the COVID-19 declaration under Section 564(b)(1) of the Act, 21 U.S.C. section 360bbb-3(b)(1), unless the authorization is terminated or revoked.     Resp Syncytial Virus by PCR NEGATIVE NEGATIVE Final     Comment: (NOTE) Fact Sheet for Patients: bloggercourse.com  Fact Sheet for Healthcare Providers: seriousbroker.it  This test is not yet approved or cleared by the [place]  FDA and has been authorized for detection and/or diagnosis of SARS-CoV-2 by FDA under an Emergency Use Authorization (EUA). This EUA will remain in effect (meaning this test can be used) for the duration of the COVID-19 declaration under Section 564(b)(1) of the Act, 21 U.S.C. section 360bbb-3(b)(1), unless the authorization is terminated or revoked.  Performed at [facility], [address] [place], [place] [address]   MRSA Next Gen by PCR, Nasal     Status: None   Collection Time: 01/20/24 12:32 AM   Specimen: Nasal Mucosa; Nasal Swab  Result Value Ref Range Status   MRSA by  PCR Next Gen NOT DETECTED NOT DETECTED Final    Comment: (NOTE) The GeneXpert MRSA Assay (FDA approved for NASAL specimens only), is one component of a comprehensive MRSA colonization surveillance program. It is not intended to diagnose MRSA infection nor to guide or monitor treatment for MRSA infections. Test performance is not FDA approved in patients less than 28 years old. Performed at [facility], [address], [place], [place] [address]       Radiology Studies: No results found.     LOS: 7 days    Elgin Lam, MD [facility] 01/26/2024, 2:43 PM   If 7PM-7AM, please contact night-coverage [email or web address]

## 2024-01-26 NOTE — Progress Notes (Signed)
   Inpatient Rehabilitation Admissions Coordinator   I met at bedside with patient  wife and son. I await medical readiness to admit to CIR.  Heron Leavell, RN, MSN Rehab Admissions Coordinator [phone] 01/26/2024 11:44 AM
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
## 2024-01-26 NOTE — Progress Notes (Signed)
 Progress Note  Patient Name: Jerry [name] Date of Encounter: 01/26/2024  [facility] Cardiologist: None   Patient Profile   Subjective   Remains in atrial fibrillation but with fairly good rate control in the low 100s.  Denies any chest pain.  Started on IV Lasix  yesterday 40 mg daily with excellent urine output.  He put out 3.7 L and net -2.  5 L since admission.  Weight down 1 pound from yesterday  Inpatient Medications    Scheduled Meds:  atorvastatin   40 mg Oral Daily   Chlorhexidine  Gluconate Cloth  6 each Topical Daily   famotidine   20 mg Oral Daily   feeding supplement  237 mL Oral TID BM   furosemide   40 mg Intravenous Daily   metoprolol  succinate  25 mg Oral Daily   multivitamin with minerals  1 tablet Oral Daily   polyethylene glycol  17 g Oral Daily   Ensure Max Protein  11 oz Oral Daily   senna-docusate  1 tablet Oral QHS   tamsulosin   0.4 mg Oral Daily   thiamine   100 mg Oral Daily   Continuous Infusions:  PRN Meds: acetaminophen , chlorpheniramine-HYDROcodone, docusate sodium , fentaNYL  (SUBLIMAZE ) injection, ipratropium-albuterol , melatonin, mouth rinse, phenol   Vital Signs    Vitals:   01/25/24 1519 01/25/24 2044 01/26/24 0500 01/26/24 0720  BP:  134/68 126/87 135/84  Pulse:  95 (!) 106 (!) 108  Resp:      Temp:  98.5 F (36.9 C) 98.5 F (36.9 C) 97.8 F (36.6 C)  TempSrc:  Oral Oral Oral  SpO2: 97% 95% 96% 93%  Weight:   72.5 kg   Height:        Intake/Output Summary (Last 24 hours) at 01/26/2024 0825 Last data filed at 01/25/2024 2044 Gross per 24 hour  Intake 360 ml  Output 3700 ml  Net -3340 ml      01/26/2024    5:00 AM 01/23/2024    5:00 AM 01/22/2024    5:00 AM  Last 3 Weights  Weight (lbs) 159 lb 13.3 oz 160 lb 15 oz 164 lb 7.4 oz  Weight (kg) 72.5 kg 73 kg 74.6 kg      Telemetry    Atrial fibrillation with RVR in the low 100s- Personally Reviewed  ECG    No new EKG to review- Personally Reviewed  Physical Exam    GEN: No acute distress.   Neck: No JVD Cardiac: Irregularly irregular and tacky, no murmurs, rubs, or gallops.  Respiratory: Clear to auscultation bilaterally. GI: Soft, nontender, non-distended  MS: No edema; No deformity. Neuro:  Nonfocal  Psych: Normal affect   Labs    High Sensitivity Troponin:   Recent Labs  Lab 01/20/24 1402 01/21/24 0314  TROPONINIHS 147* 125*      Chemistry Recent Labs  Lab 01/20/24 1958 01/21/24 0314 01/21/24 1313 01/22/24 0341 01/23/24 0327 01/24/24 0822 01/26/24 0459  NA 134* 133*   < > 136 139 140 140  K 3.9 4.2   < > 3.9 4.1 4.2 4.1  CL 104 103   < > 105 107 110 105  CO2 16* 18*   < > 21* 24 24 26   GLUCOSE 162* 130*   < > 128* 121* 102* 128*  BUN 33* 38*   < > 44* 42* 27* 20  CREATININE 2.07* 2.16*   < > 1.50* 1.18 0.89 0.87  CALCIUM  7.3* 7.3*   < > 7.9* 8.0* 8.2* 8.9  PROT 4.7*  4.6*  --  5.0*  --   --   --   ALBUMIN  2.5* 2.4*  --  2.5*  --   --   --   AST 155* 140*  --  80*  --   --   --   ALT 131* 145*  --  145*  --   --   --   ALKPHOS 134* 125  --  168*  --   --   --   BILITOT 0.7 1.0  --  0.7  --   --   --   GFRNONAA 29* 28*   < > 43* 58* >60 >60  ANIONGAP 14 12   < > 10 8 6 9    < > = values in this interval not displayed.     Hematology Recent Labs  Lab 01/24/24 0822 01/25/24 0246 01/26/24 0459  WBC 26.6* 21.5* 23.0*  RBC 3.71* 3.60* 3.72*  HGB 11.5* 11.0* 11.4*  HCT 35.0* 34.0* 34.8*  MCV 94.3 94.4 93.5  MCH 31.0 30.6 30.6  MCHC 32.9 32.4 32.8  RDW 15.4 15.4 15.4  PLT 53* 79* 136*    BNP Recent Labs  Lab 01/19/24 1944 01/20/24 1130 01/22/24 0341  BNP 292.3* 1,086.3* 821.9*     DDimer  Recent Labs  Lab 01/20/24 1130  DDIMER >20.00*     CHA2DS2-VASc Score = 5   This indicates a 7.2% annual risk of stroke. The patient's score is based upon: CHF History: 1 HTN History: 1 Diabetes History: 0 Stroke History: 0 Vascular Disease History: 1 Age Score: 2 Gender Score: 0      Radiology     VAS US  LOWER EXTREMITY VENOUS (DVT) Result Date: 01/24/2024  Lower Venous DVT Study Patient Name:  Jerry [name]  Date of Exam:   01/24/2024 Medical Rec #: [id number]         Accession #:    [id number] Date of Birth: 1928-11-01         Patient Gender: M Patient Age:   89 years Exam Location:  [facility] Procedure:      VAS US  LOWER EXTREMITY VENOUS (DVT) Referring Phys: ABDULLAHI HUSSEIN --------------------------------------------------------------------------------  Indications: Swelling, and Edema.  Comparison Study: No prior exam. Performing Technologist: Edilia Elden Appl  Examination Guidelines: A complete evaluation includes B-mode imaging, spectral Doppler, color Doppler, and power Doppler as needed of all accessible portions of each vessel. Bilateral testing is considered an integral part of a complete examination. Limited examinations for reoccurring indications may be performed as noted. The reflux portion of the exam is performed with the patient in reverse Trendelenburg.  +---------+---------------+---------+-----------+----------+--------------+ RIGHT    CompressibilityPhasicitySpontaneityPropertiesThrombus Aging +---------+---------------+---------+-----------+----------+--------------+ CFV      Full           Yes      Yes                                 +---------+---------------+---------+-----------+----------+--------------+ SFJ      Full           Yes      Yes                                 +---------+---------------+---------+-----------+----------+--------------+ FV Prox  Full                                                        +---------+---------------+---------+-----------+----------+--------------+  FV Mid   Full                                                        +---------+---------------+---------+-----------+----------+--------------+ FV DistalFull                                                         +---------+---------------+---------+-----------+----------+--------------+ PFV      Full                                                        +---------+---------------+---------+-----------+----------+--------------+ POP      Full           Yes      Yes                                 +---------+---------------+---------+-----------+----------+--------------+ PTV      Full                                                        +---------+---------------+---------+-----------+----------+--------------+ PERO     Full                                                        +---------+---------------+---------+-----------+----------+--------------+   +----+---------------+---------+-----------+----------+--------------+ LEFTCompressibilityPhasicitySpontaneityPropertiesThrombus Aging +----+---------------+---------+-----------+----------+--------------+ CFV Full           Yes      Yes                                 +----+---------------+---------+-----------+----------+--------------+ SFJ Full           Yes      Yes                                 +----+---------------+---------+-----------+----------+--------------+     Summary: RIGHT: - There is no evidence of deep vein thrombosis in the lower extremity.  - No cystic structure found in the popliteal fossa.  LEFT: - No evidence of common femoral vein obstruction.   *See table(s) above for measurements and observations. Electronically signed by Debby Robertson on 01/24/2024 at 7:42:47 PM.    Final    VAS US  UPPER EXTREMITY VENOUS DUPLEX Result Date: 01/24/2024 UPPER VENOUS STUDY  Patient Name:  DAYLYN AZBILL  Date of Exam:   01/24/2024 Medical Rec #: [id number]         Accession #:    [id number] Date of Birth: 03-31-1928         Patient Gender: M Patient  Age:   27 years Exam Location:  [facility] Procedure:      VAS US  UPPER EXTREMITY VENOUS DUPLEX Referring Phys: ABDULLAHI HUSSEIN  --------------------------------------------------------------------------------  Indications: swollen, rule out DVT. Comparison Study: No prior exam. Performing Technologist: Edilia Elden Appl  Examination Guidelines: A complete evaluation includes B-mode imaging, spectral Doppler, color Doppler, and power Doppler as needed of all accessible portions of each vessel. Bilateral testing is considered an integral part of a complete examination. Limited examinations for reoccurring indications may be performed as noted.  Right Findings: +----------+------------+---------+-----------+----------+-------+ RIGHT     CompressiblePhasicitySpontaneousPropertiesSummary +----------+------------+---------+-----------+----------+-------+ IJV           Full       Yes       Yes                      +----------+------------+---------+-----------+----------+-------+ Subclavian    Full       Yes       Yes                      +----------+------------+---------+-----------+----------+-------+ Axillary      Full       Yes       Yes                      +----------+------------+---------+-----------+----------+-------+ Brachial      Full       Yes       Yes                      +----------+------------+---------+-----------+----------+-------+ Radial        Full                                          +----------+------------+---------+-----------+----------+-------+ Ulnar         Full                                          +----------+------------+---------+-----------+----------+-------+ Cephalic      None       No        No                       +----------+------------+---------+-----------+----------+-------+ Basilic       Full       Yes       Yes                      +----------+------------+---------+-----------+----------+-------+ Superficial vein thrombosis noted in the right cephalic vein from the shoulder to the antecubital fossa. All segments in the forearm  are compressible.  Left Findings: +----------+------------+---------+-----------+----------+-------+ LEFT      CompressiblePhasicitySpontaneousPropertiesSummary +----------+------------+---------+-----------+----------+-------+ IJV           Full       Yes       Yes                      +----------+------------+---------+-----------+----------+-------+ Subclavian    Full       Yes       Yes                      +----------+------------+---------+-----------+----------+-------+  Summary:  Right: No evidence  of deep vein thrombosis in the upper extremity. Findings consistent with acute superficial vein thrombosis involving the right cephalic vein.  Left: No evidence of thrombosis in the subclavian.  *See table(s) above for measurements and observations.  Diagnosing physician: Debby Robertson Electronically signed by Debby Robertson on 01/24/2024 at 4:08:52 PM.    Final     Patient Profile     89 y.o. male  with a hx of AAA, hyperlipidemia, hypertension, MCTD, popliteal aneurysm status post stent 2024 and recent fall with subdural hemorrhage discharged from rehab 2 weeks ago  Who is being seen today for the evaluation of new onset atrial fibrillation and dilated cardiomyopathy at the request of Elgin Lam, MD.   Assessment & Plan    New onset atrial fibrillation with RVR Right bundle branch block - Noted to have irregular heartbeat on exam today and EKG confirmed atrial fibrillation with RVR - Heart rate in the low 100s on EKG - No history of A-fib in the past - Found to have dilated cardiomyopathy with EF 35 to 40% by echo several days ago and appears to be in CHF today which may have triggered A-fib - CHA2DS2-VASc score is 5 but anticoagulation at this time is contraindicated due to recent subdural bleed requiring hospitalization and rehab as well as frequent falls - Started on Toprol -XL 25 mg yesterday with improved heart rate control>> will increase to 37.5 mg today to get better  heart rate control - Check TSH   Acute combined systolic/diastolic CHF Dilated cardiomyopathy Elevated troponin - Became hypotensive after his ureteral stent placement requiring pressor support - Co. ox at that time was low at 55% - 2D echo shows new dilated cardiomyopathy with EF 35 to 40% and global HK with grade 1 diastolic dysfunction and RV dysfunction as well no significant valvular disease except for some very mild low-flow low gradient aortic stenosis -Hypotension has resolved and now off pressor support -unclear etiology of his new cardiomyopathy>> given history of PAD he is at high risk for having underlying CAD as well -High-sensitivity troponin elevated at 147 and trended down to 125.  2D echo with global HK and no focal wall motion abnormality -Given recent subdural hematoma, and other comorbidities he is a poor candidate for further ischemic workup at this time with cardiac cath and recommend medical management -Started on Lasix  40 mg IV daily for volume overload -Good urine output: Put out 3.7 L yesterday net -2.5 L since admission; SCr 0.87; K+ 4.1 -Remains volume overloaded so we will continue Lasix  40 mg IV daily -Follow strict I's and O's, daily weights and renal function while diuresing -Once heart rate is adequately controlled we will add on GDMT with low-dose losartan and low-dose Spiro as BP and renal function allow - given advanced age and risk for UTIs would avoid SGLT2i   Septic shock Pyelonephritis with obstructive uropathy E. coli bacteremia - BP initially hypotensive and requiring pressor support but now off pressors -Status post bilateral ureteral stents 1129 with cystoscopy -IV antibiotics per TR[facility] and urology   I spent 30 minutes caring for this patient today face to face, ordering and reviewing labs, reviewing records from 2D echo 01/20/2024, seeing the patient, documenting in the record  For questions or updates, please contact [facility] Please consult [email or web address] for contact info under        Signed, Wilbert Bihari, MD  01/26/2024, 8:25 AM

## 2024-01-27 DIAGNOSIS — R6521 Severe sepsis with septic shock: Secondary | ICD-10-CM | POA: Diagnosis not present

## 2024-01-27 DIAGNOSIS — I4891 Unspecified atrial fibrillation: Secondary | ICD-10-CM | POA: Diagnosis not present

## 2024-01-27 DIAGNOSIS — I5043 Acute on chronic combined systolic (congestive) and diastolic (congestive) heart failure: Secondary | ICD-10-CM | POA: Diagnosis not present

## 2024-01-27 DIAGNOSIS — A419 Sepsis, unspecified organism: Secondary | ICD-10-CM | POA: Diagnosis not present

## 2024-01-27 LAB — CBC
HCT: 35.3 % — ABNORMAL LOW (ref 39.0–52.0)
Hemoglobin: 11.3 g/dL — ABNORMAL LOW (ref 13.0–17.0)
MCH: 30.5 pg (ref 26.0–34.0)
MCHC: 32 g/dL (ref 30.0–36.0)
MCV: 95.4 fL (ref 80.0–100.0)
Platelets: 175 K/uL (ref 150–400)
RBC: 3.7 MIL/uL — ABNORMAL LOW (ref 4.22–5.81)
RDW: 15.2 % (ref 11.5–15.5)
WBC: 19.1 K/uL — ABNORMAL HIGH (ref 4.0–10.5)
nRBC: 0 % (ref 0.0–0.2)

## 2024-01-27 LAB — T3: T3, Total: 66 ng/dL — ABNORMAL LOW (ref 71–180)

## 2024-01-27 MED ORDER — METOPROLOL SUCCINATE ER 50 MG PO TB24
50.0000 mg | ORAL_TABLET | Freq: Every day | ORAL | Status: DC
  Administered 2024-01-27 – 2024-01-29 (×3): 50 mg via ORAL
  Filled 2024-01-27 (×2): qty 1

## 2024-01-27 MED ORDER — CLOPIDOGREL BISULFATE 75 MG PO TABS
75.0000 mg | ORAL_TABLET | Freq: Every day | ORAL | Status: DC
  Administered 2024-01-27 – 2024-01-29 (×3): 75 mg via ORAL
  Filled 2024-01-27 (×3): qty 1

## 2024-01-27 MED ORDER — ASPIRIN 81 MG PO TBEC
81.0000 mg | DELAYED_RELEASE_TABLET | Freq: Every day | ORAL | Status: DC
  Administered 2024-01-27 – 2024-01-29 (×3): 81 mg via ORAL
  Filled 2024-01-27 (×3): qty 1

## 2024-01-27 NOTE — Progress Notes (Signed)
 Mobility Specialist Progress Note:    01/27/24 0911  Mobility  Activity Ambulated with assistance (In hallway)  Level of Assistance Minimal assist, patient does 75% or more  Assistive Device Front wheel walker  Distance Ambulated (ft) 100 ft  RUE Weight Bearing Per Provider Order WBAT  Activity Response Tolerated well  Mobility Referral Yes  Mobility visit 1 Mobility  Mobility Specialist Start Time (ACUTE ONLY) [id number]  Mobility Specialist Stop Time (ACUTE ONLY) 0911  Mobility Specialist Time Calculation (min) (ACUTE ONLY) 18 min   Received pt in bed and agreeable to mobility. Pt required MinA to EOB and STS, otherwise MinG. Pt c/o not feeling the best, otherwise tolerated well. HR max during ambulation was 132 bpm. Returned to room without fault. Left pt in chair with personal belongings and call light within reach. All needs met.  Lavanda Pollack Mobility Specialist  Please contact via [employer] or  Rehab Office [phone]

## 2024-01-27 NOTE — Plan of Care (Signed)
  Problem: Education: Goal: Knowledge of General Education information will improve Description: Including pain rating scale, medication(s)/side effects and non-pharmacologic comfort measures Outcome: Progressing   Problem: Health Behavior/Discharge Planning: Goal: Ability to manage health-related needs will improve Outcome: Progressing   Problem: Clinical Measurements: Goal: Ability to maintain clinical measurements within normal limits will improve Outcome: Progressing Goal: Will remain free from infection Outcome: Progressing Goal: Diagnostic test results will improve Outcome: Progressing Goal: Respiratory complications will improve Outcome: Progressing Goal: Cardiovascular complication will be avoided Outcome: Progressing   Problem: Activity: Goal: Risk for activity intolerance will decrease Outcome: Progressing   Problem: Nutrition: Goal: Adequate nutrition will be maintained Outcome: Progressing   Problem: Elimination: Goal: Will not experience complications related to bowel motility Outcome: Progressing Goal: Will not experience complications related to urinary retention Outcome: Progressing   Problem: Pain Managment: Goal: General experience of comfort will improve and/or be controlled Outcome: Progressing   Problem: Skin Integrity: Goal: Risk for impaired skin integrity will decrease Outcome: Progressing   Problem: Safety: Goal: Ability to remain free from injury will improve Outcome: Progressing

## 2024-01-27 NOTE — Progress Notes (Signed)
 Progress Note  Patient Name: Jerry [name] Date of Encounter: 01/27/2024  [facility] Cardiologist: None    Subjective   Remains in atrial fibrillation with heart rates in the low 100-1 20s.  Started on IV Lasix  yesterday put out 2.3 L and is net -4.3 L since admission.   Inpatient Medications    Scheduled Meds:  aspirin  EC  81 mg Oral Daily   atorvastatin   40 mg Oral Daily   Chlorhexidine  Gluconate Cloth  6 each Topical Daily   clopidogrel   75 mg Oral Daily   famotidine   20 mg Oral Daily   feeding supplement  237 mL Oral TID BM   furosemide   40 mg Intravenous Daily   metoprolol  succinate  37.5 mg Oral Daily   multivitamin with minerals  1 tablet Oral Daily   polyethylene glycol  17 g Oral BID   Ensure Max Protein  11 oz Oral Daily   senna-docusate  1 tablet Oral QHS   tamsulosin   0.4 mg Oral Daily   thiamine   100 mg Oral Daily   Continuous Infusions:  PRN Meds: acetaminophen , chlorpheniramine-HYDROcodone, docusate sodium , fentaNYL  (SUBLIMAZE ) injection, ipratropium-albuterol , melatonin, mouth rinse, phenol   Vital Signs    Vitals:   01/26/24 2046 01/27/24 0434 01/27/24 0500 01/27/24 0757  BP: 116/66 129/78  (!) 146/78  Pulse: 95 (!) 103  (!) 106  Resp: 18   16  Temp: 98.9 F (37.2 C) 98 F (36.7 C)  98.1 F (36.7 C)  TempSrc: Oral Oral    SpO2: 95% 95%  97%  Weight:   72.5 kg   Height:        Intake/Output Summary (Last 24 hours) at 01/27/2024 0904 Last data filed at 01/27/2024 0445 Gross per 24 hour  Intake 240 ml  Output 2300 ml  Net -2060 ml      01/27/2024    5:00 AM 01/26/2024    5:00 AM 01/23/2024    5:00 AM  Last 3 Weights  Weight (lbs) 159 lb 13.3 oz 159 lb 13.3 oz 160 lb 15 oz  Weight (kg) 72.5 kg 72.5 kg 73 kg      Telemetry    Atrial fibrillation with RVR in the 100-1 20s- Personally Reviewed  ECG    No new EKG to review - Personally Reviewed  Physical Exam   GEN: Well nourished, well developed in no acute  distress HEENT: Normal NECK: No JVD; No carotid bruits LYMPHATICS: No lymphadenopathy CARDIAC: Irregularly irregular and tachycardic, no murmurs, rubs, gallops RESPIRATORY:  Clear to auscultation without rales, wheezing or rhonchi  ABDOMEN: Soft, non-tender, non-distended MUSCULOSKELETAL:  No edema; No deformity  SKIN: Warm and dry NEUROLOGIC:  Alert and oriented x 3 PSYCHIATRIC:  Normal affect  Labs    High Sensitivity Troponin:   Recent Labs  Lab 01/20/24 1402 01/21/24 0314  TROPONINIHS 147* 125*      Chemistry Recent Labs  Lab 01/20/24 1958 01/21/24 0314 01/21/24 1313 01/22/24 0341 01/23/24 0327 01/24/24 0822 01/26/24 0459  NA 134* 133*   < > 136 139 140 140  K 3.9 4.2   < > 3.9 4.1 4.2 4.1  CL 104 103   < > 105 107 110 105  CO2 16* 18*   < > 21* 24 24 26   GLUCOSE 162* 130*   < > 128* 121* 102* 128*  BUN 33* 38*   < > 44* 42* 27* 20  CREATININE 2.07* 2.16*   < > 1.50* 1.18 0.89  0.87  CALCIUM  7.3* 7.3*   < > 7.9* 8.0* 8.2* 8.9  PROT 4.7* 4.6*  --  5.0*  --   --   --   ALBUMIN  2.5* 2.4*  --  2.5*  --   --   --   AST 155* 140*  --  80*  --   --   --   ALT 131* 145*  --  145*  --   --   --   ALKPHOS 134* 125  --  168*  --   --   --   BILITOT 0.7 1.0  --  0.7  --   --   --   GFRNONAA 29* 28*   < > 43* 58* >60 >60  ANIONGAP 14 12   < > 10 8 6 9    < > = values in this interval not displayed.     Hematology Recent Labs  Lab 01/25/24 0246 01/26/24 0459 01/27/24 0242  WBC 21.5* 23.0* 19.1*  RBC 3.60* 3.72* 3.70*  HGB 11.0* 11.4* 11.3*  HCT 34.0* 34.8* 35.3*  MCV 94.4 93.5 95.4  MCH 30.6 30.6 30.5  MCHC 32.4 32.8 32.0  RDW 15.4 15.4 15.2  PLT 79* 136* 175    BNP Recent Labs  Lab 01/20/24 1130 01/22/24 0341  BNP 1,086.3* 821.9*     DDimer  Recent Labs  Lab 01/20/24 1130  DDIMER >20.00*     CHA2DS2-VASc Score = 5   This indicates a 7.2% annual risk of stroke. The patient's score is based upon: CHF History: 1 HTN History: 1 Diabetes  History: 0 Stroke History: 0 Vascular Disease History: 1 Age Score: 2 Gender Score: 0      Radiology    No results found.   Patient Profile     89 y.o. male with a hx of AAA, hyperlipidemia, hypertension, MCTD, popliteal aneurysm status post stent 2024 and recent fall with subdural hemorrhage discharged from rehab 2 weeks ago  Who is being seen today for the evaluation of new onset atrial fibrillation and dilated cardiomyopathy at the request of Elgin Lam, MD.    Assessment & Plan    New onset atrial fibrillation with RVR Right bundle branch block - Noted to have irregular heartbeat on exam today and EKG confirmed atrial fibrillation with RVR - No history of A-fib in the past - Found to have dilated cardiomyopathy with EF 35 to 40% by echo several days ago and appears to be in CHF which may have triggered A-fib or vice versa - CHA2DS2-VASc score is 5 but anticoagulation at this time is contraindicated due to recent subdural bleed requiring hospitalization and rehab as well as frequent falls - TSH normal at 4.5 - BP stable so  will increase Toprol -XL to 50 mg daily for better heart rate control.  If we cannot get heart rate controlled may need to add on IV Amio   Acute combined systolic/diastolic CHF Dilated cardiomyopathy Elevated troponin - Became hypotensive after his ureteral stent placement requiring pressor support - Co. ox at that time was low at 55% - 2D echo shows new dilated cardiomyopathy with EF 35 to 40% and global HK with grade 1 diastolic dysfunction and RV dysfunction as well no significant valvular disease except for some very mild low-flow low gradient aortic stenosis -Hypotension has resolved and now off pressor support -unclear etiology of his new cardiomyopathy>> given history of PAD he is at high risk for having underlying CAD as well -High-sensitivity troponin elevated at  147 and trended down to 125.   -2D echo with global HK and no focal wall motion  abnormality -Given recent subdural hematoma, and other comorbidities he is a poor candidate for further ischemic workup at this time with cardiac cath and recommend medical management -started on Lasix  40mg  IV daily with good UOP: Put out 2.3 L yesterday and net -4.3 L since admission -No significant change in weight from yesterday but overall overall down 13lbs from 01/21/2024 -Suspect that poorly controlled A-fib is playing a role in her overload -Increasing Toprol  XL to 50 mg daily for better heart rate control -Continue IV Lasix  40 mg daily -Follow strict I's and O's, daily weights and renal function while diuresing -Once heart rate is adequately controlled we will add on GDMT with low-dose losartan and low-dose Spiro as BP and renal function allow - given advanced age and risk for UTIs would avoid SGLT2i   Septic shock Pyelonephritis with obstructive uropathy E. coli bacteremia - BP initially hypotensive and requiring pressor support but now off pressors - Status post bilateral ureteral stents 1129 with cystoscopy - IV antibiotics per TRH and urology  PAD Hyperlipidemia -Status post stenting of the popliteal aneurysm May 2024 -Continue ASA 81 mg daily and Plavix  75 mg daily -Continue atorvastatin  40 mg daily  I spent 30 minutes caring for this patient today face to face, ordering and reviewing labs, reviewing records from 2D echo 12/2023, seeing the patient, documenting in the record  For questions or updates, please contact [facility] Please consult [email or web address] for contact info under        Signed, Wilbert Bihari, MD  01/27/2024, 9:04 AM

## 2024-01-27 NOTE — Progress Notes (Signed)
 PROGRESS NOTE    Jerry [name]  FMW:[id number] DOB: [birth year] DOA: 01/19/2024 PCP: [name] ORN, MD   Brief Narrative: Jerry [name] is a 89 y.o. male with a history of AAA, popliteal aneurysm s/p stent and DAPT, hyperlipidemia, hypertension, subdural hematoma, hyperlipidemia.  Patient presented secondary to a fall and delirium, found to have evidence of septic shock secondary to obstructive uropathy, in addition to pyelonephritis and bacteremia. Patient required ICU admission and intubation. Urology consulted and performed urgent bilateral ureteral stent placements. Symptoms improving with treatment.   Assessment and Plan:  Septic shock Present on admission. Secondary to acute pyelonephritis and obstructive uropathy with evidence of E. Coli bacteremia. Patient started empirically on Vancomycin  and Zosyn  initially and transitioned to Ceftriaxone  IV. Patient required ICU admission and was started on dobutamine , vasopressin , phenylephrine  and norepinephrine . Vasopressors weaned off with treatment of infection and ureteral obstruction.  Acute pyelonephritis E. Coli bacteremia Secondary to left ureter obstruction. Urine culture significant for E. Coli. Blood culture also with E. Coli. Empiric Vancomycin  and Zosyn  transitioned to Ceftriaxone  for management. -Continue Ceftriaxone  IV, will treat for 10 days  Bilateral ureteral stones Left ureter obstruction Urology consulted and performed cystoscopy with insertion of bilateral ureteral stents on 11/29. Per urology, patient can have foley catheter removed. -Will discontinue foley catheter once Lasix  IV diuresis stopped  Acute metabolic encephalopathy Secondary to sepsis and acute infection. Resolved with treatment.  New onset HFrEF Unclear etiology, but patient does have a history of PAD, so ischemia is a possible etiology. Could also be related to sepsis.  Troponin elevated at 147 with downtrend to 125. Transthoracic  Echocardiogram significant for an LVEF of 35-40% with global hypokinesis. Patient managed, shortly, with Lasix . Cardiology consulted and have restarted Lasix  IV diuresis -Cardiology recommendations: Lasix  IV, increase to Toprol  XL 50 mg daily -Daily weights/strict in/out -Cardiac monitoring  Atrial fibrillation with RVR Noted to have an irregular rhythm on exam. EKG obtained and confirms atrial fibrillation. Complicated by low LVEF. Cardiology consulted. Not a candidate for anticoagulation per cardiology secondary to fall/SDH history. -Cardiology recommendations: Toprol  XL 50 mg daily -Cardiac monitoring  Acute respiratory failure with hypoxia Patient with respiratory distress and hypoxia secondary to COPD vs heart failure prior to admission to ICU. Resolved.  Lactic acidosis Lactic acid of 4.1 on admission. Resolved with management of sepsis.  AKI Baseline creatinine of around 0.9. Creatinine of 1.75 with peak of 2.16. Complicated by mild hydronephrosis from ureteral obstruction. Creatinine improved. AKI resolved.   Elevated TSH Elevated free T4 with low T3. Discordant. Possibly related to recovery from patient's critical illness. Will recommend repeat thyroid studies in a few weeks.  Right upper extremity SVT Diagnosed on 12/3. -Supportive care  PAD Patient managed on DAPT for stent x2 at SFA/popliteal artery. Antiplatelets held on admission secondary to sepsis and thrombocytopenia. Patient with atrial fibrillation, however per cardiology, unlikely he will be started on anticoagulation. -Restart aspirin  and Plavix   Thrombocytopenia Likely secondary to acute infection. Platelets down to a low of 30,000. Improved to 79,000.  Cough Likely related to recent intubation. Clear production. -Tussionex at bedtime as needed  History of subdural hematoma Noted. No evidence of hematoma on CT head obtained this admission.  AAA Noted.  Cholelithiasis Noted incidentally on CT imaging.  Asymptomatic.  Bilateral rib fractures Noted. No evidence for acute fracture.  Aortic atherosclerosis Noted.  Moderate malnutrition -Dietitian recommendations (12/2): Liberalize diet to regular in the setting of advanced age Ensure Plus High Protein po BID, each  supplement provides 350 kcal and 20 grams of protein. Magic cup TID with meals, each supplement provides 290 kcal and 9 grams of protein Continue MVI with minerals daily  Constipation -Continue MiraLAX  and Colace -Dulcolax suppository  Pressure injury Right/mid/left sacrum. Unclear if present on admission.   DVT prophylaxis: SCDs Code Status:   Code Status: Limited: Do not attempt resuscitation (DNR) -DNR-LIMITED -Do Not Intubate/DNI  Family Communication: Wife, daughter and son at bedside Disposition Plan: Discharge possibly to acute inpatient rehabilitation likely in 1-2 days pending  ongoing cardiology recommendations   Consultants:  PCCM Urology Cardiology  Procedures:  Transthoracic Echocardiogram Cystoscopy with insertion of bilateral ureteral stents.   Antimicrobials: Vancomycin  Zosyn  Ceftriaxone     Subjective: Patient wakes up with a sore throat but symptoms improve. Cough is improved. No dyspnea.  Objective: BP 113/66   Pulse 93   Temp 98.1 F (36.7 C)   Resp 16   Ht 5' 5 (1.651 m)   Wt 72.5 kg   SpO2 96%   BMI 26.60 kg/m   Examination:  General exam: Appears calm and comfortable. Respiratory system: Clear to auscultation. Respiratory effort normal. Cardiovascular system: S1 & S2 heard, irregular rhythm, normal rate. Gastrointestinal system: Abdomen is nondistended, soft and nontender. Normal bowel sounds heard. Central nervous system: Alert Musculoskeletal: No calf tenderness, 1+ pitting edema   Data Reviewed: I have personally reviewed following labs and imaging studies  CBC Lab Results  Component Value Date   WBC 19.1 (H) 01/27/2024   RBC 3.70 (L) 01/27/2024   HGB 11.3  (L) 01/27/2024   HCT 35.3 (L) 01/27/2024   MCV 95.4 01/27/2024   MCH 30.5 01/27/2024   PLT 175 01/27/2024   MCHC 32.0 01/27/2024   RDW 15.2 01/27/2024   LYMPHSABS 0.1 (L) 01/19/2024   MONOABS 0.1 01/19/2024   EOSABS 0.0 01/19/2024   BASOSABS 0.0 01/19/2024     Last metabolic panel Lab Results  Component Value Date   NA 140 01/26/2024   K 4.1 01/26/2024   CL 105 01/26/2024   CO2 26 01/26/2024   BUN 20 01/26/2024   CREATININE 0.87 01/26/2024   GLUCOSE 128 (H) 01/26/2024   GFRNONAA >60 01/26/2024   GFRAA >60 10/17/2018   CALCIUM  8.9 01/26/2024   PHOS 2.5 01/23/2024   PROT 5.0 (L) 01/22/2024   ALBUMIN  2.5 (L) 01/22/2024   BILITOT 0.7 01/22/2024   ALKPHOS 168 (H) 01/22/2024   AST 80 (H) 01/22/2024   ALT 145 (H) 01/22/2024   ANIONGAP 9 01/26/2024    GFR: Estimated Creatinine Clearance: 46.1 mL/min (by C-G formula based on SCr of 0.87 mg/dL).  Recent Results (from the past 240 hours)  Urine Culture     Status: Abnormal   Collection Time: 01/19/24  7:34 PM   Specimen: Urine, Random  Result Value Ref Range Status   Specimen Description URINE, RANDOM  Final   Special Requests   Final    NONE Reflexed from [id number] Performed at [facility], [address], [place], [place] [address]    Culture >=100,000 COLONIES/mL ESCHERICHIA COLI (A)  Final   Report Status 01/21/2024 FINAL  Final   Organism ID, Bacteria ESCHERICHIA COLI (A)  Final      Susceptibility   Escherichia coli - MIC*    AMPICILLIN 8 SENSITIVE Sensitive     CEFAZOLIN  (URINE) Value in next row Sensitive      2 SENSITIVEThis is a modified FDA-approved test that has been validated and its performance characteristics determined by  the reporting laboratory.  This laboratory is certified under the Clinical Laboratory Improvement Amendments CLIA as qualified to perform high complexity clinical laboratory testing.    CEFEPIME Value in next row Sensitive      2 SENSITIVEThis is a modified FDA-approved test that  has been validated and its performance characteristics determined by the reporting laboratory.  This laboratory is certified under the Clinical Laboratory Improvement Amendments CLIA as qualified to perform high complexity clinical laboratory testing.    ERTAPENEM Value in next row Sensitive      2 SENSITIVEThis is a modified FDA-approved test that has been validated and its performance characteristics determined by the reporting laboratory.  This laboratory is certified under the Clinical Laboratory Improvement Amendments CLIA as qualified to perform high complexity clinical laboratory testing.    CEFTRIAXONE  Value in next row Sensitive      2 SENSITIVEThis is a modified FDA-approved test that has been validated and its performance characteristics determined by the reporting laboratory.  This laboratory is certified under the Clinical Laboratory Improvement Amendments CLIA as qualified to perform high complexity clinical laboratory testing.    CIPROFLOXACIN Value in next row Sensitive      2 SENSITIVEThis is a modified FDA-approved test that has been validated and its performance characteristics determined by the reporting laboratory.  This laboratory is certified under the Clinical Laboratory Improvement Amendments CLIA as qualified to perform high complexity clinical laboratory testing.    GENTAMICIN Value in next row Sensitive      2 SENSITIVEThis is a modified FDA-approved test that has been validated and its performance characteristics determined by the reporting laboratory.  This laboratory is certified under the Clinical Laboratory Improvement Amendments CLIA as qualified to perform high complexity clinical laboratory testing.    NITROFURANTOIN Value in next row Sensitive      2 SENSITIVEThis is a modified FDA-approved test that has been validated and its performance characteristics determined by the reporting laboratory.  This laboratory is certified under the Clinical Laboratory Improvement  Amendments CLIA as qualified to perform high complexity clinical laboratory testing.    TRIMETH/SULFA Value in next row Sensitive      2 SENSITIVEThis is a modified FDA-approved test that has been validated and its performance characteristics determined by the reporting laboratory.  This laboratory is certified under the Clinical Laboratory Improvement Amendments CLIA as qualified to perform high complexity clinical laboratory testing.    AMPICILLIN/SULBACTAM Value in next row Sensitive      2 SENSITIVEThis is a modified FDA-approved test that has been validated and its performance characteristics determined by the reporting laboratory.  This laboratory is certified under the Clinical Laboratory Improvement Amendments CLIA as qualified to perform high complexity clinical laboratory testing.    PIP/TAZO Value in next row Sensitive      <=4 SENSITIVEThis is a modified FDA-approved test that has been validated and its performance characteristics determined by the reporting laboratory.  This laboratory is certified under the Clinical Laboratory Improvement Amendments CLIA as qualified to perform high complexity clinical laboratory testing.    MEROPENEM Value in next row Sensitive      <=4 SENSITIVEThis is a modified FDA-approved test that has been validated and its performance characteristics determined by the reporting laboratory.  This laboratory is certified under the Clinical Laboratory Improvement Amendments CLIA as qualified to perform high complexity clinical laboratory testing.    * >=100,000 COLONIES/mL ESCHERICHIA COLI  Blood Culture (routine x 2)     Status: Abnormal   Collection Time:  01/19/24  7:44 PM   Specimen: BLOOD  Result Value Ref Range Status   Specimen Description BLOOD SITE NOT SPECIFIED  Final   Special Requests   Final    BOTTLES DRAWN AEROBIC AND ANAEROBIC Blood Culture adequate volume   Culture  Setup Time   Final    GRAM NEGATIVE RODS IN BOTH AEROBIC AND ANAEROBIC  BOTTLES CRITICAL RESULT CALLED TO, READ BACK BY AND VERIFIED WITH: PHARMD J LEDFORD 01/20/2024 @ 0622 BY AB Performed at [facility], [address] [place], [place] [address]    Culture ESCHERICHIA COLI (A)  Final   Report Status 01/22/2024 FINAL  Final   Organism ID, Bacteria ESCHERICHIA COLI  Final      Susceptibility   Escherichia coli - MIC*    AMPICILLIN 8 SENSITIVE Sensitive     CEFAZOLIN  (NON-URINE) 4 INTERMEDIATE Intermediate     CEFEPIME <=0.12 SENSITIVE Sensitive     ERTAPENEM <=0.12 SENSITIVE Sensitive     CEFTRIAXONE  <=0.25 SENSITIVE Sensitive     CIPROFLOXACIN <=0.06 SENSITIVE Sensitive     GENTAMICIN <=1 SENSITIVE Sensitive     MEROPENEM <=0.25 SENSITIVE Sensitive     TRIMETH/SULFA <=20 SENSITIVE Sensitive     AMPICILLIN/SULBACTAM <=2 SENSITIVE Sensitive     PIP/TAZO Value in next row Sensitive      <=4 SENSITIVEThis is a modified FDA-approved test that has been validated and its performance characteristics determined by the reporting laboratory.  This laboratory is certified under the Clinical Laboratory Improvement Amendments CLIA as qualified to perform high complexity clinical laboratory testing.    * ESCHERICHIA COLI  Blood Culture ID Panel (Reflexed)     Status: Abnormal   Collection Time: 01/19/24  7:44 PM  Result Value Ref Range Status   Enterococcus faecalis NOT DETECTED NOT DETECTED Final   Enterococcus Faecium NOT DETECTED NOT DETECTED Final   Listeria monocytogenes NOT DETECTED NOT DETECTED Final   Staphylococcus species NOT DETECTED NOT DETECTED Final   Staphylococcus aureus (BCID) NOT DETECTED NOT DETECTED Final   Staphylococcus epidermidis NOT DETECTED NOT DETECTED Final   Staphylococcus lugdunensis NOT DETECTED NOT DETECTED Final   Streptococcus species NOT DETECTED NOT DETECTED Final   Streptococcus agalactiae NOT DETECTED NOT DETECTED Final   Streptococcus pneumoniae NOT DETECTED NOT DETECTED Final   Streptococcus pyogenes NOT DETECTED NOT  DETECTED Final   A.calcoaceticus-baumannii NOT DETECTED NOT DETECTED Final   Bacteroides fragilis NOT DETECTED NOT DETECTED Final   Enterobacterales DETECTED (A) NOT DETECTED Final    Comment: Enterobacterales represent a large order of gram negative bacteria, not a single organism. CRITICAL RESULT CALLED TO, READ BACK BY AND VERIFIED WITH: PHARMD J LEDFORD 01/20/2024 @ 0622 BY AB    Enterobacter cloacae complex NOT DETECTED NOT DETECTED Final   Escherichia coli DETECTED (A) NOT DETECTED Final    Comment: CRITICAL RESULT CALLED TO, READ BACK BY AND VERIFIED WITH: PHARMD J LEDFORD 01/20/2024 @ 0622 BY AB    Klebsiella aerogenes NOT DETECTED NOT DETECTED Final   Klebsiella oxytoca NOT DETECTED NOT DETECTED Final   Klebsiella pneumoniae NOT DETECTED NOT DETECTED Final   Proteus species NOT DETECTED NOT DETECTED Final   Salmonella species NOT DETECTED NOT DETECTED Final   Serratia marcescens NOT DETECTED NOT DETECTED Final   Haemophilus influenzae NOT DETECTED NOT DETECTED Final   Neisseria meningitidis NOT DETECTED NOT DETECTED Final   Pseudomonas aeruginosa NOT DETECTED NOT DETECTED Final   Stenotrophomonas maltophilia NOT DETECTED NOT DETECTED Final   Candida albicans  NOT DETECTED NOT DETECTED Final   Candida auris NOT DETECTED NOT DETECTED Final   Candida glabrata NOT DETECTED NOT DETECTED Final   Candida krusei NOT DETECTED NOT DETECTED Final   Candida parapsilosis NOT DETECTED NOT DETECTED Final   Candida tropicalis NOT DETECTED NOT DETECTED Final   Cryptococcus neoformans/gattii NOT DETECTED NOT DETECTED Final   CTX-M ESBL NOT DETECTED NOT DETECTED Final   Carbapenem resistance IMP NOT DETECTED NOT DETECTED Final   Carbapenem resistance KPC NOT DETECTED NOT DETECTED Final   Carbapenem resistance NDM NOT DETECTED NOT DETECTED Final   Carbapenem resist OXA 48 LIKE NOT DETECTED NOT DETECTED Final   Carbapenem resistance VIM NOT DETECTED NOT DETECTED Final    Comment: Performed at  [facility], [address], [place], [place] [address]  Blood Culture (routine x 2)     Status: Abnormal   Collection Time: 01/19/24  8:26 PM   Specimen: BLOOD  Result Value Ref Range Status   Specimen Description BLOOD SITE NOT SPECIFIED  Final   Special Requests   Final    BOTTLES DRAWN AEROBIC AND ANAEROBIC Blood Culture adequate volume   Culture  Setup Time   Final    GRAM NEGATIVE RODS IN BOTH AEROBIC AND ANAEROBIC BOTTLES CRITICAL VALUE NOTED.  VALUE IS CONSISTENT WITH PREVIOUSLY REPORTED AND CALLED VALUE.    Culture (A)  Final    ESCHERICHIA COLI SUSCEPTIBILITIES PERFORMED ON PREVIOUS CULTURE WITHIN THE LAST 5 DAYS. Performed at [facility], [address] [place], [place] [address]    Report Status 01/22/2024 FINAL  Final  Resp panel by RT-PCR (RSV, Flu A&B, Covid) Anterior Nasal Swab     Status: None   Collection Time: 01/19/24  8:29 PM   Specimen: Anterior Nasal Swab  Result Value Ref Range Status   SARS Coronavirus 2 by RT PCR NEGATIVE NEGATIVE Final   Influenza A by PCR NEGATIVE NEGATIVE Final   Influenza B by PCR NEGATIVE NEGATIVE Final    Comment: (NOTE) The Xpert Xpress SARS-CoV-2/FLU/RSV plus assay is intended as an aid in the diagnosis of influenza from Nasopharyngeal swab specimens and should not be used as a sole basis for treatment. Nasal washings and aspirates are unacceptable for Xpert Xpress SARS-CoV-2/FLU/RSV testing.  Fact Sheet for Patients: bloggercourse.com  Fact Sheet for Healthcare Providers: seriousbroker.it  This test is not yet approved or cleared by the [place]  FDA and has been authorized for detection and/or diagnosis of SARS-CoV-2 by FDA under an Emergency Use Authorization (EUA). This EUA will remain in effect (meaning this test can be used) for the duration of the COVID-19 declaration under Section 564(b)(1) of the Act, 21 U.S.C. section 360bbb-3(b)(1), unless the  authorization is terminated or revoked.     Resp Syncytial Virus by PCR NEGATIVE NEGATIVE Final    Comment: (NOTE) Fact Sheet for Patients: bloggercourse.com  Fact Sheet for Healthcare Providers: seriousbroker.it  This test is not yet approved or cleared by the [place]  FDA and has been authorized for detection and/or diagnosis of SARS-CoV-2 by FDA under an Emergency Use Authorization (EUA). This EUA will remain in effect (meaning this test can be used) for the duration of the COVID-19 declaration under Section 564(b)(1) of the Act, 21 U.S.C. section 360bbb-3(b)(1), unless the authorization is terminated or revoked.  Performed at [facility], [address], [place], [place] [address]   MRSA Next Gen by PCR, Nasal     Status: None   Collection Time:  01/20/24 12:32 AM   Specimen: Nasal Mucosa; Nasal Swab  Result Value Ref Range Status   MRSA by PCR Next Gen NOT DETECTED NOT DETECTED Final    Comment: (NOTE) The GeneXpert MRSA Assay (FDA approved for NASAL specimens only), is one component of a comprehensive MRSA colonization surveillance program. It is not intended to diagnose MRSA infection nor to guide or monitor treatment for MRSA infections. Test performance is not FDA approved in patients less than 40 years old. Performed at [facility], [address], [place], [place] [address]       Radiology Studies: No results found.     LOS: 8 days    Elgin Lam, MD [facility] 01/27/2024, 11:26 AM   If 7PM-7AM, please contact night-coverage [email or web address]

## 2024-01-28 DIAGNOSIS — R6521 Severe sepsis with septic shock: Secondary | ICD-10-CM | POA: Diagnosis not present

## 2024-01-28 DIAGNOSIS — I4891 Unspecified atrial fibrillation: Secondary | ICD-10-CM | POA: Diagnosis not present

## 2024-01-28 DIAGNOSIS — I5043 Acute on chronic combined systolic (congestive) and diastolic (congestive) heart failure: Secondary | ICD-10-CM | POA: Diagnosis not present

## 2024-01-28 DIAGNOSIS — A419 Sepsis, unspecified organism: Secondary | ICD-10-CM | POA: Diagnosis not present

## 2024-01-28 LAB — BASIC METABOLIC PANEL WITH GFR
Anion gap: 7 (ref 5–15)
BUN: 24 mg/dL — ABNORMAL HIGH (ref 8–23)
CO2: 28 mmol/L (ref 22–32)
Calcium: 8.3 mg/dL — ABNORMAL LOW (ref 8.9–10.3)
Chloride: 104 mmol/L (ref 98–111)
Creatinine, Ser: 0.9 mg/dL (ref 0.61–1.24)
GFR, Estimated: >60 mL/min (ref >60)
Glucose, Bld: 124 mg/dL — ABNORMAL HIGH (ref 70–99)
Potassium: 4.1 mmol/L (ref 3.5–5.1)
Sodium: 139 mmol/L (ref 135–145)

## 2024-01-28 MED ORDER — ACETAMINOPHEN 325 MG PO TABS
650.0000 mg | ORAL_TABLET | Freq: Four times a day (QID) | ORAL | Status: DC | PRN
  Administered 2024-01-28 – 2024-01-29 (×4): 650 mg via ORAL
  Filled 2024-01-28 (×4): qty 2

## 2024-01-28 MED ORDER — POLYETHYLENE GLYCOL 3350 17 G PO PACK: 17.0000 g | PACK | Freq: Every day | ORAL | Status: DC | PRN

## 2024-01-28 NOTE — Plan of Care (Signed)
   Problem: Activity: Goal: Risk for activity intolerance will decrease Outcome: Progressing   Problem: Nutrition: Goal: Adequate nutrition will be maintained Outcome: Progressing   Problem: Pain Managment: Goal: General experience of comfort will improve and/or be controlled Outcome: Progressing   Problem: Safety: Goal: Ability to remain free from injury will improve Outcome: Progressing

## 2024-01-28 NOTE — Progress Notes (Signed)
 PROGRESS NOTE    Jerry [name]  FMW:[id number] DOB: Aug 16, 1928 DOA: 01/19/2024 PCP: [name] ORN, MD   Brief Narrative: Jerry [name] is a 89 y.o. male with a history of AAA, popliteal aneurysm s/p stent and DAPT, hyperlipidemia, hypertension, subdural hematoma, hyperlipidemia.  Patient presented secondary to a fall and delirium, found to have evidence of septic shock secondary to obstructive uropathy, in addition to pyelonephritis and bacteremia. Patient required ICU admission and intubation. Urology consulted and performed urgent bilateral ureteral stent placements. Symptoms improving with treatment.   Assessment and Plan:  Septic shock Present on admission. Secondary to acute pyelonephritis and obstructive uropathy with evidence of E. Coli bacteremia. Patient started empirically on Vancomycin  and Zosyn  initially and transitioned to Ceftriaxone  IV. Patient required ICU admission and was started on dobutamine , vasopressin , phenylephrine  and norepinephrine . Vasopressors weaned off with treatment of infection and ureteral obstruction.  Acute pyelonephritis E. Coli bacteremia Secondary to left ureter obstruction. Urine culture significant for E. Coli. Blood culture also with E. Coli. Empiric Vancomycin  and Zosyn  transitioned to Ceftriaxone  for management. -Continue Ceftriaxone  IV, will treat for 10 days  Bilateral ureteral stones Left ureter obstruction Urology consulted and performed cystoscopy with insertion of bilateral ureteral stents on 11/29. Per urology, patient can have foley catheter removed. -Will discontinue foley catheter once Lasix  IV diuresis stopped  Acute metabolic encephalopathy Secondary to sepsis and acute infection. Resolved with treatment.  New onset HFrEF Unclear etiology, but patient does have a history of PAD, so ischemia is a possible etiology. Could also be related to sepsis.  Troponin elevated at 147 with downtrend to 125. Transthoracic  Echocardiogram significant for an LVEF of 35-40% with global hypokinesis. Patient managed, shortly, with Lasix . Cardiology consulted and have restarted Lasix  IV diuresis -Cardiology recommendations: Lasix  IV, increase to Toprol  XL 50 mg daily -Daily weights/strict in/out -Cardiac monitoring  Atrial fibrillation/flutter with RVR Noted to have an irregular rhythm on exam. EKG obtained and confirms atrial fibrillation. Complicated by low LVEF. Cardiology consulted. Not a candidate for anticoagulation per cardiology secondary to fall/SDH history. Patient transitioned to atrial flutter, noted on telemetry 12/7. -Cardiology recommendations: Toprol  XL 50 mg daily -Cardiac monitoring  Acute respiratory failure with hypoxia Patient with respiratory distress and hypoxia secondary to COPD vs heart failure prior to admission to ICU. Resolved.  Lactic acidosis Lactic acid of 4.1 on admission. Resolved with management of sepsis.  AKI Baseline creatinine of around 0.9. Creatinine of 1.75 with peak of 2.16. Complicated by mild hydronephrosis from ureteral obstruction. Creatinine improved. AKI resolved.   Elevated TSH Elevated free T4 with low T3. Discordant. Possibly related to recovery from patient's critical illness. Will recommend repeat thyroid studies in a few weeks.  Right upper extremity SVT Diagnosed on 12/3. -Supportive care  PAD Patient managed on DAPT for stent x2 at SFA/popliteal artery. Antiplatelets held on admission secondary to sepsis and thrombocytopenia. Patient with atrial fibrillation, however per cardiology, unlikely he will be started on anticoagulation. -Continue aspirin  and Plavix   Thrombocytopenia Likely secondary to acute infection. Platelets down to a low of 30,000. Improved to 79,000.  Cough Likely related to recent intubation. Clear production. -Tussionex at bedtime as needed  History of subdural hematoma Noted. No evidence of hematoma on CT head obtained this  admission.  AAA Noted.  Cholelithiasis Noted incidentally on CT imaging. Asymptomatic.  Bilateral rib fractures Noted. No evidence for acute fracture.  Aortic atherosclerosis Noted.  Moderate malnutrition -Dietitian recommendations (12/2): Liberalize diet to regular in the setting of  advanced age Ensure Plus High Protein po BID, each supplement provides 350 kcal and 20 grams of protein. Magic cup TID with meals, each supplement provides 290 kcal and 9 grams of protein Continue MVI with minerals daily  Constipation -Continue MiraLAX  and Colace -Dulcolax suppository  Pressure injury Right/mid/left sacrum. Unclear if present on admission.   DVT prophylaxis: SCDs Code Status:   Code Status: Limited: Do not attempt resuscitation (DNR) -DNR-LIMITED -Do Not Intubate/DNI  Family Communication: Wife and son at bedside Disposition Plan: Discharge possibly to acute inpatient rehabilitation likely in 1-2 days pending  ongoing cardiology recommendations   Consultants:  PCCM Urology Cardiology  Procedures:  Transthoracic Echocardiogram Cystoscopy with insertion of bilateral ureteral stents.   Antimicrobials: Vancomycin  Zosyn  Ceftriaxone     Subjective: Patient not feeling great. Wants to go home. Family believes he has been doing better recently. They report he ate a big dinner last night. Sore throat issue is improved. Still with a cough.  Objective: BP (!) 116/57 (BP Location: Left Arm)   Pulse 98   Temp 97.9 F (36.6 C) (Oral)   Resp 18   Ht 5' 5 (1.651 m)   Wt 70.9 kg   SpO2 96%   BMI 26.01 kg/m   Examination:  General exam: Appears calm and comfortable. Respiratory system: Clear to auscultation. Respiratory effort normal. Cardiovascular system: S1 & S2 heard, irregular rhythm, normal rate. Gastrointestinal system: Abdomen is nondistended, soft and nontender. Normal bowel sounds heard. Central nervous system: Alert Musculoskeletal: No calf tenderness,  trace pitting edema   Data Reviewed: I have personally reviewed following labs and imaging studies  CBC Lab Results  Component Value Date   WBC 19.1 (H) 01/27/2024   RBC 3.70 (L) 01/27/2024   HGB 11.3 (L) 01/27/2024   HCT 35.3 (L) 01/27/2024   MCV 95.4 01/27/2024   MCH 30.5 01/27/2024   PLT 175 01/27/2024   MCHC 32.0 01/27/2024   RDW 15.2 01/27/2024   LYMPHSABS 0.1 (L) 01/19/2024   MONOABS 0.1 01/19/2024   EOSABS 0.0 01/19/2024   BASOSABS 0.0 01/19/2024     Last metabolic panel Lab Results  Component Value Date   NA 139 01/28/2024   K 4.1 01/28/2024   CL 104 01/28/2024   CO2 28 01/28/2024   BUN 24 (H) 01/28/2024   CREATININE 0.90 01/28/2024   GLUCOSE 124 (H) 01/28/2024   GFRNONAA >60 01/28/2024   GFRAA >60 10/17/2018   CALCIUM  8.3 (L) 01/28/2024   PHOS 2.5 01/23/2024   PROT 5.0 (L) 01/22/2024   ALBUMIN  2.5 (L) 01/22/2024   BILITOT 0.7 01/22/2024   ALKPHOS 168 (H) 01/22/2024   AST 80 (H) 01/22/2024   ALT 145 (H) 01/22/2024   ANIONGAP 7 01/28/2024    GFR: Estimated Creatinine Clearance: 44.6 mL/min (by C-G formula based on SCr of 0.9 mg/dL).  Recent Results (from the past 240 hours)  Urine Culture     Status: Abnormal   Collection Time: 01/19/24  7:34 PM   Specimen: Urine, Random  Result Value Ref Range Status   Specimen Description URINE, RANDOM  Final   Special Requests   Final    NONE Reflexed from [id number] Performed at [facility], [address] [place], [place] [address]    Culture >=100,000 COLONIES/mL ESCHERICHIA COLI (A)  Final   Report Status 01/21/2024 FINAL  Final   Organism ID, Bacteria ESCHERICHIA COLI (A)  Final      Susceptibility   Escherichia coli - MIC*    AMPICILLIN 8  SENSITIVE Sensitive     CEFAZOLIN  (URINE) Value in next row Sensitive      2 SENSITIVEThis is a modified FDA-approved test that has been validated and its performance characteristics determined by the reporting laboratory.  This laboratory is certified under the  Clinical Laboratory Improvement Amendments CLIA as qualified to perform high complexity clinical laboratory testing.    CEFEPIME Value in next row Sensitive      2 SENSITIVEThis is a modified FDA-approved test that has been validated and its performance characteristics determined by the reporting laboratory.  This laboratory is certified under the Clinical Laboratory Improvement Amendments CLIA as qualified to perform high complexity clinical laboratory testing.    ERTAPENEM Value in next row Sensitive      2 SENSITIVEThis is a modified FDA-approved test that has been validated and its performance characteristics determined by the reporting laboratory.  This laboratory is certified under the Clinical Laboratory Improvement Amendments CLIA as qualified to perform high complexity clinical laboratory testing.    CEFTRIAXONE  Value in next row Sensitive      2 SENSITIVEThis is a modified FDA-approved test that has been validated and its performance characteristics determined by the reporting laboratory.  This laboratory is certified under the Clinical Laboratory Improvement Amendments CLIA as qualified to perform high complexity clinical laboratory testing.    CIPROFLOXACIN Value in next row Sensitive      2 SENSITIVEThis is a modified FDA-approved test that has been validated and its performance characteristics determined by the reporting laboratory.  This laboratory is certified under the Clinical Laboratory Improvement Amendments CLIA as qualified to perform high complexity clinical laboratory testing.    GENTAMICIN Value in next row Sensitive      2 SENSITIVEThis is a modified FDA-approved test that has been validated and its performance characteristics determined by the reporting laboratory.  This laboratory is certified under the Clinical Laboratory Improvement Amendments CLIA as qualified to perform high complexity clinical laboratory testing.    NITROFURANTOIN Value in next row Sensitive      2  SENSITIVEThis is a modified FDA-approved test that has been validated and its performance characteristics determined by the reporting laboratory.  This laboratory is certified under the Clinical Laboratory Improvement Amendments CLIA as qualified to perform high complexity clinical laboratory testing.    TRIMETH/SULFA Value in next row Sensitive      2 SENSITIVEThis is a modified FDA-approved test that has been validated and its performance characteristics determined by the reporting laboratory.  This laboratory is certified under the Clinical Laboratory Improvement Amendments CLIA as qualified to perform high complexity clinical laboratory testing.    AMPICILLIN/SULBACTAM Value in next row Sensitive      2 SENSITIVEThis is a modified FDA-approved test that has been validated and its performance characteristics determined by the reporting laboratory.  This laboratory is certified under the Clinical Laboratory Improvement Amendments CLIA as qualified to perform high complexity clinical laboratory testing.    PIP/TAZO Value in next row Sensitive      <=4 SENSITIVEThis is a modified FDA-approved test that has been validated and its performance characteristics determined by the reporting laboratory.  This laboratory is certified under the Clinical Laboratory Improvement Amendments CLIA as qualified to perform high complexity clinical laboratory testing.    MEROPENEM Value in next row Sensitive      <=4 SENSITIVEThis is a modified FDA-approved test that has been validated and its performance characteristics determined by the reporting laboratory.  This laboratory is certified under the Clinical Laboratory Improvement  Amendments CLIA as qualified to perform high complexity clinical laboratory testing.    * >=100,000 COLONIES/mL ESCHERICHIA COLI  Blood Culture (routine x 2)     Status: Abnormal   Collection Time: 01/19/24  7:44 PM   Specimen: BLOOD  Result Value Ref Range Status   Specimen Description BLOOD  SITE NOT SPECIFIED  Final   Special Requests   Final    BOTTLES DRAWN AEROBIC AND ANAEROBIC Blood Culture adequate volume   Culture  Setup Time   Final    GRAM NEGATIVE RODS IN BOTH AEROBIC AND ANAEROBIC BOTTLES CRITICAL RESULT CALLED TO, READ BACK BY AND VERIFIED WITH: PHARMD J LEDFORD 01/20/2024 @ 0622 BY AB Performed at [facility], [address], [place], [place] [address]    Culture ESCHERICHIA COLI (A)  Final   Report Status 01/22/2024 FINAL  Final   Organism ID, Bacteria ESCHERICHIA COLI  Final      Susceptibility   Escherichia coli - MIC*    AMPICILLIN 8 SENSITIVE Sensitive     CEFAZOLIN  (NON-URINE) 4 INTERMEDIATE Intermediate     CEFEPIME <=0.12 SENSITIVE Sensitive     ERTAPENEM <=0.12 SENSITIVE Sensitive     CEFTRIAXONE  <=0.25 SENSITIVE Sensitive     CIPROFLOXACIN <=0.06 SENSITIVE Sensitive     GENTAMICIN <=1 SENSITIVE Sensitive     MEROPENEM <=0.25 SENSITIVE Sensitive     TRIMETH/SULFA <=20 SENSITIVE Sensitive     AMPICILLIN/SULBACTAM <=2 SENSITIVE Sensitive     PIP/TAZO Value in next row Sensitive      <=4 SENSITIVEThis is a modified FDA-approved test that has been validated and its performance characteristics determined by the reporting laboratory.  This laboratory is certified under the Clinical Laboratory Improvement Amendments CLIA as qualified to perform high complexity clinical laboratory testing.    * ESCHERICHIA COLI  Blood Culture ID Panel (Reflexed)     Status: Abnormal   Collection Time: 01/19/24  7:44 PM  Result Value Ref Range Status   Enterococcus faecalis NOT DETECTED NOT DETECTED Final   Enterococcus Faecium NOT DETECTED NOT DETECTED Final   Listeria monocytogenes NOT DETECTED NOT DETECTED Final   Staphylococcus species NOT DETECTED NOT DETECTED Final   Staphylococcus aureus (BCID) NOT DETECTED NOT DETECTED Final   Staphylococcus epidermidis NOT DETECTED NOT DETECTED Final   Staphylococcus lugdunensis NOT DETECTED NOT DETECTED Final    Streptococcus species NOT DETECTED NOT DETECTED Final   Streptococcus agalactiae NOT DETECTED NOT DETECTED Final   Streptococcus pneumoniae NOT DETECTED NOT DETECTED Final   Streptococcus pyogenes NOT DETECTED NOT DETECTED Final   A.calcoaceticus-baumannii NOT DETECTED NOT DETECTED Final   Bacteroides fragilis NOT DETECTED NOT DETECTED Final   Enterobacterales DETECTED (A) NOT DETECTED Final    Comment: Enterobacterales represent a large order of gram negative bacteria, not a single organism. CRITICAL RESULT CALLED TO, READ BACK BY AND VERIFIED WITH: PHARMD J LEDFORD 01/20/2024 @ 0622 BY AB    Enterobacter cloacae complex NOT DETECTED NOT DETECTED Final   Escherichia coli DETECTED (A) NOT DETECTED Final    Comment: CRITICAL RESULT CALLED TO, READ BACK BY AND VERIFIED WITH: PHARMD J LEDFORD 01/20/2024 @ 0622 BY AB    Klebsiella aerogenes NOT DETECTED NOT DETECTED Final   Klebsiella oxytoca NOT DETECTED NOT DETECTED Final   Klebsiella pneumoniae NOT DETECTED NOT DETECTED Final   Proteus species NOT DETECTED NOT DETECTED Final   Salmonella species NOT DETECTED NOT DETECTED Final   Serratia marcescens NOT DETECTED NOT DETECTED Final   Haemophilus influenzae NOT  DETECTED NOT DETECTED Final   Neisseria meningitidis NOT DETECTED NOT DETECTED Final   Pseudomonas aeruginosa NOT DETECTED NOT DETECTED Final   Stenotrophomonas maltophilia NOT DETECTED NOT DETECTED Final   Candida albicans NOT DETECTED NOT DETECTED Final   Candida auris NOT DETECTED NOT DETECTED Final   Candida glabrata NOT DETECTED NOT DETECTED Final   Candida krusei NOT DETECTED NOT DETECTED Final   Candida parapsilosis NOT DETECTED NOT DETECTED Final   Candida tropicalis NOT DETECTED NOT DETECTED Final   Cryptococcus neoformans/gattii NOT DETECTED NOT DETECTED Final   CTX-M ESBL NOT DETECTED NOT DETECTED Final   Carbapenem resistance IMP NOT DETECTED NOT DETECTED Final   Carbapenem resistance KPC NOT DETECTED NOT DETECTED  Final   Carbapenem resistance NDM NOT DETECTED NOT DETECTED Final   Carbapenem resist OXA 48 LIKE NOT DETECTED NOT DETECTED Final   Carbapenem resistance VIM NOT DETECTED NOT DETECTED Final    Comment: Performed at [facility], [address] [place], [place] [address]  Blood Culture (routine x 2)     Status: Abnormal   Collection Time: 01/19/24  8:26 PM   Specimen: BLOOD  Result Value Ref Range Status   Specimen Description BLOOD SITE NOT SPECIFIED  Final   Special Requests   Final    BOTTLES DRAWN AEROBIC AND ANAEROBIC Blood Culture adequate volume   Culture  Setup Time   Final    GRAM NEGATIVE RODS IN BOTH AEROBIC AND ANAEROBIC BOTTLES CRITICAL VALUE NOTED.  VALUE IS CONSISTENT WITH PREVIOUSLY REPORTED AND CALLED VALUE.    Culture (A)  Final    ESCHERICHIA COLI SUSCEPTIBILITIES PERFORMED ON PREVIOUS CULTURE WITHIN THE LAST 5 DAYS. Performed at [facility], [address] [place], [place] [address]    Report Status 01/22/2024 FINAL  Final  Resp panel by RT-PCR (RSV, Flu A&B, Covid) Anterior Nasal Swab     Status: None   Collection Time: 01/19/24  8:29 PM   Specimen: Anterior Nasal Swab  Result Value Ref Range Status   SARS Coronavirus 2 by RT PCR NEGATIVE NEGATIVE Final   Influenza A by PCR NEGATIVE NEGATIVE Final   Influenza B by PCR NEGATIVE NEGATIVE Final    Comment: (NOTE) The Xpert Xpress SARS-CoV-2/FLU/RSV plus assay is intended as an aid in the diagnosis of influenza from Nasopharyngeal swab specimens and should not be used as a sole basis for treatment. Nasal washings and aspirates are unacceptable for Xpert Xpress SARS-CoV-2/FLU/RSV testing.  Fact Sheet for Patients: bloggercourse.com  Fact Sheet for Healthcare Providers: seriousbroker.it  This test is not yet approved or cleared by the [place]  FDA and has been authorized for detection and/or diagnosis of SARS-CoV-2 by FDA under an Emergency  Use Authorization (EUA). This EUA will remain in effect (meaning this test can be used) for the duration of the COVID-19 declaration under Section 564(b)(1) of the Act, 21 U.S.C. section 360bbb-3(b)(1), unless the authorization is terminated or revoked.     Resp Syncytial Virus by PCR NEGATIVE NEGATIVE Final    Comment: (NOTE) Fact Sheet for Patients: bloggercourse.com  Fact Sheet for Healthcare Providers: seriousbroker.it  This test is not yet approved or cleared by the [place]  FDA and has been authorized for detection and/or diagnosis of SARS-CoV-2 by FDA under an Emergency Use Authorization (EUA). This EUA will remain in effect (meaning this test can be used) for the duration of the COVID-19 declaration under Section 564(b)(1) of the Act, 21 U.S.C. section 360bbb-3(b)(1), unless the authorization is  terminated or revoked.  Performed at [facility], [address], [place], [place] [address]   MRSA Next Gen by PCR, Nasal     Status: None   Collection Time: 01/20/24 12:32 AM   Specimen: Nasal Mucosa; Nasal Swab  Result Value Ref Range Status   MRSA by PCR Next Gen NOT DETECTED NOT DETECTED Final    Comment: (NOTE) The GeneXpert MRSA Assay (FDA approved for NASAL specimens only), is one component of a comprehensive MRSA colonization surveillance program. It is not intended to diagnose MRSA infection nor to guide or monitor treatment for MRSA infections. Test performance is not FDA approved in patients less than 18 years old. Performed at [facility], [address], [place], [place] [address]       Radiology Studies: No results found.     LOS: 9 days    Elgin Lam, MD [facility] 01/28/2024, 2:51 PM   If 7PM-7AM, please contact night-coverage [email or web address]

## 2024-01-28 NOTE — Progress Notes (Signed)
 Mobility Specialist Progress Note:    01/28/24 1048  Mobility  Activity Ambulated with assistance (In hallway)  Level of Assistance Minimal assist, patient does 75% or more  Assistive Device Front wheel walker  Distance Ambulated (ft) 100 ft  RUE Weight Bearing Per Provider Order WBAT  Activity Response Tolerated well  Mobility Referral Yes  Mobility visit 1 Mobility  Mobility Specialist Start Time (ACUTE ONLY) 1023  Mobility Specialist Stop Time (ACUTE ONLY) 1048  Mobility Specialist Time Calculation (min) (ACUTE ONLY) 25 min   Received pt in bed and agreeable to mobility. Pt required MinA to EOB and STS, otherwise MinG. No c/o. HR max during ambulation was 124 bpm. Returned to room without fault. Left pt in chair with personal belongings and call light within reach. All needs met.   Lavanda Pollack Mobility Specialist  Please contact via [employer] or  Rehab Office [phone]

## 2024-01-28 NOTE — Progress Notes (Signed)
 Progress Note  Patient Name: Jerry [name] Date of Encounter: 01/28/2024  [facility] Cardiologist: None    Subjective   Now in atrial flutter with controlled ventricular response  Continues to have good diuresis.  He put out 1.3 L yesterday and is net -5.3 L since admission   Inpatient Medications    Scheduled Meds:  aspirin  EC  81 mg Oral Daily   atorvastatin   40 mg Oral Daily   Chlorhexidine  Gluconate Cloth  6 each Topical Daily   clopidogrel   75 mg Oral Daily   famotidine   20 mg Oral Daily   feeding supplement  237 mL Oral TID BM   furosemide   40 mg Intravenous Daily   metoprolol  succinate  50 mg Oral Daily   multivitamin with minerals  1 tablet Oral Daily   Ensure Max Protein  11 oz Oral Daily   senna-docusate  1 tablet Oral QHS   tamsulosin   0.4 mg Oral Daily   thiamine   100 mg Oral Daily   Continuous Infusions:  PRN Meds: acetaminophen , chlorpheniramine-HYDROcodone, docusate sodium , fentaNYL  (SUBLIMAZE ) injection, ipratropium-albuterol , melatonin, mouth rinse, phenol, polyethylene glycol   Vital Signs    Vitals:   01/27/24 2022 01/28/24 0500 01/28/24 0533 01/28/24 0935  BP: (!) 109/59  (!) 120/50 (!) 116/57  Pulse:   93 98  Resp: 16  16 18   Temp: 98.2 F (36.8 C)  98.5 F (36.9 C) 97.9 F (36.6 C)  TempSrc: Oral  Oral Oral  SpO2: 98%  95% 96%  Weight:  70.9 kg    Height:        Intake/Output Summary (Last 24 hours) at 01/28/2024 0949 Last data filed at 01/27/2024 1500 Gross per 24 hour  Intake --  Output 1300 ml  Net -1300 ml      01/28/2024    5:00 AM 01/27/2024    5:00 AM 01/26/2024    5:00 AM  Last 3 Weights  Weight (lbs) 156 lb 4.9 oz 159 lb 13.3 oz 159 lb 13.3 oz  Weight (kg) 70.9 kg 72.5 kg 72.5 kg      Telemetry    Atrial flutter with controlled ventricular response personally Reviewed  ECG    No new EKG to review - Personally Reviewed  Physical Exam   GEN: Well nourished, well developed in no acute distress HEENT:  Normal NECK: No JVD; No carotid bruits LYMPHATICS: No lymphadenopathy CARDIAC:RRR, no murmurs, rubs, gallops RESPIRATORY:  Clear to auscultation without rales, wheezing or rhonchi  ABDOMEN: Soft, non-tender, non-distended MUSCULOSKELETAL:  No edema; No deformity  SKIN: Warm and dry NEUROLOGIC:  Alert and oriented x 3 PSYCHIATRIC:  Normal affect  Labs    High Sensitivity Troponin:   Recent Labs  Lab 01/20/24 1402 01/21/24 0314  TROPONINIHS 147* 125*      Chemistry Recent Labs  Lab 01/22/24 0341 01/23/24 0327 01/24/24 0822 01/26/24 0459  NA 136 139 140 140  K 3.9 4.1 4.2 4.1  CL 105 107 110 105  CO2 21* 24 24 26   GLUCOSE 128* 121* 102* 128*  BUN 44* 42* 27* 20  CREATININE 1.50* 1.18 0.89 0.87  CALCIUM  7.9* 8.0* 8.2* 8.9  PROT 5.0*  --   --   --   ALBUMIN  2.5*  --   --   --   AST 80*  --   --   --   ALT 145*  --   --   --   ALKPHOS 168*  --   --   --  BILITOT 0.7  --   --   --   GFRNONAA 43* 58* >60 >60  ANIONGAP 10 8 6 9      Hematology Recent Labs  Lab 01/25/24 0246 01/26/24 0459 01/27/24 0242  WBC 21.5* 23.0* 19.1*  RBC 3.60* 3.72* 3.70*  HGB 11.0* 11.4* 11.3*  HCT 34.0* 34.8* 35.3*  MCV 94.4 93.5 95.4  MCH 30.6 30.6 30.5  MCHC 32.4 32.8 32.0  RDW 15.4 15.4 15.2  PLT 79* 136* 175    BNP Recent Labs  Lab 01/22/24 0341  BNP 821.9*     DDimer  No results for input(s): DDIMER in the last 168 hours.    CHA2DS2-VASc Score = 5   This indicates a 7.2% annual risk of stroke. The patient's score is based upon: CHF History: 1 HTN History: 1 Diabetes History: 0 Stroke History: 0 Vascular Disease History: 1 Age Score: 2 Gender Score: 0      Radiology    No results found.   Patient Profile     89 y.o. male with a hx of AAA, hyperlipidemia, hypertension, MCTD, popliteal aneurysm status post stent 2024 and recent fall with subdural hemorrhage discharged from rehab 2 weeks ago  Who is being seen today for the evaluation of new onset  atrial fibrillation and dilated cardiomyopathy at the request of Elgin Lam, MD.    Assessment & Plan    New onset atrial fibrillation with RVR Right bundle branch block - Noted to have irregular heartbeat on exam today and EKG confirmed atrial fibrillation with RVR - No history of A-fib in the past - Found to have dilated cardiomyopathy with EF 35 to 40% by echo several days ago and appears to be in CHF which may have triggered A-fib or vice versa - CHA2DS2-VASc score is 5 but anticoagulation at this time is contraindicated due to recent subdural bleed requiring hospitalization and rehab as well as frequent falls - TSH normal at 4.5 -Heart rate fairly well-controlled on Toprol  XL 50 mg daily  Acute combined systolic/diastolic CHF Dilated cardiomyopathy Elevated troponin - Became hypotensive after his ureteral stent placement requiring pressor support - Co. ox at that time was low at 55% - 2D echo shows new dilated cardiomyopathy with EF 35 to 40% and global HK with grade 1 diastolic dysfunction and RV dysfunction as well no significant valvular disease except for some very mild low-flow low gradient aortic stenosis -Hypotension has resolved and now off pressor support -unclear etiology of his new cardiomyopathy>> given history of PAD he is at high risk for having underlying CAD as well -High-sensitivity troponin elevated at 147 and trended down to 125.   -2D echo with global HK and no focal wall motion abnormality -Given recent subdural hematoma, and other comorbidities he is a poor candidate for further ischemic workup at this time with cardiac cath and recommend medical management -started on Lasix  40mg  IV daily with good UOP: Put out 1.3 L yesterday and net - 5.3 L since admission -Weight down 3 pounds from yesterday and 16 pounds from admission -Labs pending -Still has crackles at the bases bilaterally -Suspect that poorly controlled A-fib is playing a role in her  overload -Continue Toprol  XL 50 mg daily -Continue IV Lasix  40 mg daily  -Follow strict I's and O's, daily weights and renal function while diuresing -Once heart rate is adequately controlled we will add on GDMT with low-dose losartan and low-dose Spiro as BP and renal function allow -given advanced age and risk for  UTIs would avoid SGLT2i   Septic shock Pyelonephritis with obstructive uropathy E. coli bacteremia - BP initially hypotensive and requiring pressor support but now off pressors - Status post bilateral ureteral stents 1129 with cystoscopy - IV antibiotics per TRH and urology  PAD Hyperlipidemia -Status post stenting of the popliteal aneurysm May 2024 -Continue ASA 81 mg daily and Plavix  75 mg daily -Continue atorvastatin  40 mg daily  I spent 30 minutes caring for this patient today face to face, ordering and reviewing labs, reviewing records from 2D echo 12/2023, seeing the patient, documenting in the record  For questions or updates, please contact [facility] Please consult [email or web address] for contact info under        Signed, Wilbert Bihari, MD  01/28/2024, 9:49 AM

## 2024-01-29 ENCOUNTER — Other Ambulatory Visit: Payer: Self-pay

## 2024-01-29 ENCOUNTER — Encounter (HOSPITAL_COMMUNITY): Payer: Self-pay | Admitting: Physical Medicine and Rehabilitation

## 2024-01-29 ENCOUNTER — Inpatient Hospital Stay (HOSPITAL_COMMUNITY)
Admission: AD | Admit: 2024-01-29 | Discharge: 2024-02-08 | DRG: 945 | Disposition: A | Source: Intra-hospital | Attending: Physical Medicine and Rehabilitation | Admitting: Physical Medicine and Rehabilitation

## 2024-01-29 ENCOUNTER — Telehealth: Payer: Self-pay | Admitting: Cardiovascular Disease

## 2024-01-29 ENCOUNTER — Other Ambulatory Visit: Payer: Self-pay | Admitting: Urology

## 2024-01-29 DIAGNOSIS — Z91041 Radiographic dye allergy status: Secondary | ICD-10-CM

## 2024-01-29 DIAGNOSIS — L89151 Pressure ulcer of sacral region, stage 1: Secondary | ICD-10-CM | POA: Diagnosis present

## 2024-01-29 DIAGNOSIS — Z91048 Other nonmedicinal substance allergy status: Secondary | ICD-10-CM

## 2024-01-29 DIAGNOSIS — X58XXXS Exposure to other specified factors, sequela: Secondary | ICD-10-CM | POA: Diagnosis present

## 2024-01-29 DIAGNOSIS — Z8249 Family history of ischemic heart disease and other diseases of the circulatory system: Secondary | ICD-10-CM | POA: Diagnosis not present

## 2024-01-29 DIAGNOSIS — H3530 Unspecified macular degeneration: Secondary | ICD-10-CM | POA: Diagnosis present

## 2024-01-29 DIAGNOSIS — E872 Acidosis, unspecified: Secondary | ICD-10-CM | POA: Diagnosis present

## 2024-01-29 DIAGNOSIS — A419 Sepsis, unspecified organism: Secondary | ICD-10-CM | POA: Diagnosis not present

## 2024-01-29 DIAGNOSIS — H9190 Unspecified hearing loss, unspecified ear: Secondary | ICD-10-CM | POA: Diagnosis present

## 2024-01-29 DIAGNOSIS — R351 Nocturia: Secondary | ICD-10-CM | POA: Diagnosis not present

## 2024-01-29 DIAGNOSIS — R4189 Other symptoms and signs involving cognitive functions and awareness: Secondary | ICD-10-CM | POA: Diagnosis not present

## 2024-01-29 DIAGNOSIS — R5381 Other malaise: Secondary | ICD-10-CM | POA: Diagnosis present

## 2024-01-29 DIAGNOSIS — I5020 Unspecified systolic (congestive) heart failure: Secondary | ICD-10-CM | POA: Diagnosis not present

## 2024-01-29 DIAGNOSIS — E785 Hyperlipidemia, unspecified: Secondary | ICD-10-CM | POA: Diagnosis present

## 2024-01-29 DIAGNOSIS — S42001S Fracture of unspecified part of right clavicle, sequela: Secondary | ICD-10-CM

## 2024-01-29 DIAGNOSIS — A4150 Gram-negative sepsis, unspecified: Secondary | ICD-10-CM | POA: Diagnosis not present

## 2024-01-29 DIAGNOSIS — L89620 Pressure ulcer of left heel, unstageable: Secondary | ICD-10-CM | POA: Diagnosis present

## 2024-01-29 DIAGNOSIS — I5031 Acute diastolic (congestive) heart failure: Secondary | ICD-10-CM | POA: Diagnosis present

## 2024-01-29 DIAGNOSIS — Z79899 Other long term (current) drug therapy: Secondary | ICD-10-CM

## 2024-01-29 DIAGNOSIS — L89890 Pressure ulcer of other site, unstageable: Secondary | ICD-10-CM | POA: Diagnosis present

## 2024-01-29 DIAGNOSIS — I10 Essential (primary) hypertension: Secondary | ICD-10-CM | POA: Diagnosis not present

## 2024-01-29 DIAGNOSIS — R066 Hiccough: Secondary | ICD-10-CM | POA: Diagnosis present

## 2024-01-29 DIAGNOSIS — I4891 Unspecified atrial fibrillation: Secondary | ICD-10-CM | POA: Diagnosis present

## 2024-01-29 DIAGNOSIS — N136 Pyonephrosis: Secondary | ICD-10-CM | POA: Diagnosis present

## 2024-01-29 DIAGNOSIS — B957 Other staphylococcus as the cause of diseases classified elsewhere: Secondary | ICD-10-CM | POA: Diagnosis present

## 2024-01-29 DIAGNOSIS — Z7982 Long term (current) use of aspirin: Secondary | ICD-10-CM

## 2024-01-29 DIAGNOSIS — R7989 Other specified abnormal findings of blood chemistry: Secondary | ICD-10-CM | POA: Diagnosis not present

## 2024-01-29 DIAGNOSIS — G9341 Metabolic encephalopathy: Secondary | ICD-10-CM | POA: Diagnosis present

## 2024-01-29 DIAGNOSIS — L89323 Pressure ulcer of left buttock, stage 3: Secondary | ICD-10-CM | POA: Diagnosis present

## 2024-01-29 DIAGNOSIS — D75839 Thrombocytosis, unspecified: Secondary | ICD-10-CM | POA: Diagnosis not present

## 2024-01-29 DIAGNOSIS — K5900 Constipation, unspecified: Secondary | ICD-10-CM | POA: Diagnosis present

## 2024-01-29 DIAGNOSIS — Z7902 Long term (current) use of antithrombotics/antiplatelets: Secondary | ICD-10-CM

## 2024-01-29 DIAGNOSIS — N3000 Acute cystitis without hematuria: Secondary | ICD-10-CM | POA: Diagnosis not present

## 2024-01-29 DIAGNOSIS — N390 Urinary tract infection, site not specified: Secondary | ICD-10-CM | POA: Diagnosis not present

## 2024-01-29 DIAGNOSIS — N179 Acute kidney failure, unspecified: Secondary | ICD-10-CM | POA: Diagnosis present

## 2024-01-29 DIAGNOSIS — I110 Hypertensive heart disease with heart failure: Secondary | ICD-10-CM | POA: Diagnosis present

## 2024-01-29 DIAGNOSIS — Z888 Allergy status to other drugs, medicaments and biological substances status: Secondary | ICD-10-CM

## 2024-01-29 DIAGNOSIS — Z87891 Personal history of nicotine dependence: Secondary | ICD-10-CM

## 2024-01-29 LAB — BASIC METABOLIC PANEL WITH GFR
Anion gap: 9 (ref 5–15)
BUN: 27 mg/dL — ABNORMAL HIGH (ref 8–23)
CO2: 26 mmol/L (ref 22–32)
Calcium: 8.4 mg/dL — ABNORMAL LOW (ref 8.9–10.3)
Chloride: 106 mmol/L (ref 98–111)
Creatinine, Ser: 0.85 mg/dL (ref 0.61–1.24)
GFR, Estimated: >60 mL/min (ref >60)
Glucose, Bld: 115 mg/dL — ABNORMAL HIGH (ref 70–99)
Potassium: 3.7 mmol/L (ref 3.5–5.1)
Sodium: 141 mmol/L (ref 135–145)

## 2024-01-29 LAB — CBC
HCT: 33.8 % — ABNORMAL LOW (ref 39.0–52.0)
Hemoglobin: 11.2 g/dL — ABNORMAL LOW (ref 13.0–17.0)
MCH: 31.5 pg (ref 26.0–34.0)
MCHC: 33.1 g/dL (ref 30.0–36.0)
MCV: 95.2 fL (ref 80.0–100.0)
Platelets: 329 K/uL (ref 150–400)
RBC: 3.55 MIL/uL — ABNORMAL LOW (ref 4.22–5.81)
RDW: 15.1 % (ref 11.5–15.5)
WBC: 13 K/uL — ABNORMAL HIGH (ref 4.0–10.5)
nRBC: 0 % (ref 0.0–0.2)

## 2024-01-29 MED ORDER — ENSURE MAX PROTEIN PO LIQD: 11.0000 [oz_av] | Freq: Every day | ORAL | Status: DC

## 2024-01-29 MED ORDER — AMIODARONE HCL 200 MG PO TABS: 200.0000 mg | ORAL_TABLET | Freq: Every day | ORAL | Status: DC

## 2024-01-29 MED ORDER — ATORVASTATIN CALCIUM 40 MG PO TABS
40.0000 mg | ORAL_TABLET | Freq: Every day | ORAL | Status: DC
  Administered 2024-01-30 – 2024-02-08 (×10): 40 mg via ORAL
  Filled 2024-01-29: qty 4
  Filled 2024-01-29 (×9): qty 1

## 2024-01-29 MED ORDER — METOPROLOL SUCCINATE ER 50 MG PO TB24: 50.0000 mg | ORAL_TABLET | Freq: Every day | ORAL | Status: AC

## 2024-01-29 MED ORDER — DOCUSATE SODIUM 100 MG PO CAPS: 100.0000 mg | ORAL_CAPSULE | Freq: Two times a day (BID) | ORAL | Status: DC | PRN

## 2024-01-29 MED ORDER — AMIODARONE HCL 200 MG PO TABS
200.0000 mg | ORAL_TABLET | Freq: Two times a day (BID) | ORAL | Status: DC
  Administered 2024-01-29 – 2024-02-08 (×20): 200 mg via ORAL
  Filled 2024-01-29 (×20): qty 1

## 2024-01-29 MED ORDER — GABAPENTIN 100 MG PO CAPS: 100.0000 mg | ORAL_CAPSULE | Freq: Three times a day (TID) | ORAL | Status: DC

## 2024-01-29 MED ORDER — IPRATROPIUM-ALBUTEROL 0.5-2.5 (3) MG/3ML IN SOLN: 3.0000 mL | RESPIRATORY_TRACT | Status: DC | PRN

## 2024-01-29 MED ORDER — POLYETHYLENE GLYCOL 3350 17 G PO PACK: 17.0000 g | PACK | Freq: Every day | ORAL | Status: AC | PRN

## 2024-01-29 MED ORDER — FAMOTIDINE 20 MG PO TABS
20.0000 mg | ORAL_TABLET | Freq: Every day | ORAL | Status: DC
  Administered 2024-01-30 – 2024-02-08 (×10): 20 mg via ORAL
  Filled 2024-01-29 (×10): qty 1

## 2024-01-29 MED ORDER — GABAPENTIN 100 MG PO CAPS
100.0000 mg | ORAL_CAPSULE | Freq: Three times a day (TID) | ORAL | Status: DC
  Administered 2024-01-29 (×2): 100 mg via ORAL
  Filled 2024-01-29 (×2): qty 1

## 2024-01-29 MED ORDER — CLOPIDOGREL BISULFATE 75 MG PO TABS
75.0000 mg | ORAL_TABLET | Freq: Every day | ORAL | Status: DC
  Administered 2024-01-30 – 2024-02-08 (×10): 75 mg via ORAL
  Filled 2024-01-29 (×10): qty 1

## 2024-01-29 MED ORDER — FUROSEMIDE 10 MG/ML IJ SOLN
40.0000 mg | Freq: Every day | INTRAMUSCULAR | Status: AC
  Administered 2024-01-29: 40 mg via INTRAVENOUS
  Filled 2024-01-29: qty 4

## 2024-01-29 MED ORDER — METOPROLOL SUCCINATE ER 50 MG PO TB24
50.0000 mg | ORAL_TABLET | Freq: Every day | ORAL | Status: DC
  Administered 2024-01-30 – 2024-02-08 (×10): 50 mg via ORAL
  Filled 2024-01-29 (×10): qty 1

## 2024-01-29 MED ORDER — BACLOFEN 5 MG HALF TABLET
5.0000 mg | ORAL_TABLET | Freq: Two times a day (BID) | ORAL | Status: DC
  Administered 2024-01-29 – 2024-01-31 (×4): 5 mg via ORAL
  Filled 2024-01-29 (×4): qty 1

## 2024-01-29 MED ORDER — SENNOSIDES-DOCUSATE SODIUM 8.6-50 MG PO TABS
1.0000 | ORAL_TABLET | Freq: Every day | ORAL | Status: DC
  Administered 2024-01-29 – 2024-02-06 (×6): 1 via ORAL
  Filled 2024-01-29 (×10): qty 1

## 2024-01-29 MED ORDER — MELATONIN 5 MG PO TABS
5.0000 mg | ORAL_TABLET | Freq: Every evening | ORAL | Status: DC | PRN
  Administered 2024-02-01 – 2024-02-07 (×2): 5 mg via ORAL
  Filled 2024-01-29 (×2): qty 1

## 2024-01-29 MED ORDER — AMIODARONE HCL 200 MG PO TABS: ORAL_TABLET | ORAL | Status: DC

## 2024-01-29 MED ORDER — SODIUM CHLORIDE 0.9 % IV SOLN: 2.0000 g | INTRAVENOUS | Status: DC

## 2024-01-29 MED ORDER — TAMSULOSIN HCL 0.4 MG PO CAPS
0.4000 mg | ORAL_CAPSULE | Freq: Every day | ORAL | Status: DC
  Administered 2024-01-30 – 2024-02-02 (×4): 0.4 mg via ORAL
  Filled 2024-01-29 (×4): qty 1

## 2024-01-29 MED ORDER — AMIODARONE HCL 200 MG PO TABS
200.0000 mg | ORAL_TABLET | Freq: Two times a day (BID) | ORAL | Status: DC
  Administered 2024-01-29: 200 mg via ORAL
  Filled 2024-01-29: qty 1

## 2024-01-29 MED ORDER — ASPIRIN 81 MG PO TBEC
81.0000 mg | DELAYED_RELEASE_TABLET | Freq: Every day | ORAL | Status: DC
  Administered 2024-01-30 – 2024-02-08 (×10): 81 mg via ORAL
  Filled 2024-01-29 (×10): qty 1

## 2024-01-29 MED ORDER — ENSURE PLUS HIGH PROTEIN PO LIQD
237.0000 mL | Freq: Three times a day (TID) | ORAL | Status: DC
  Administered 2024-01-30 – 2024-02-07 (×8): 237 mL via ORAL
  Filled 2024-01-29: qty 237

## 2024-01-29 MED ORDER — ADULT MULTIVITAMIN W/MINERALS CH
1.0000 | ORAL_TABLET | Freq: Every day | ORAL | Status: DC
  Administered 2024-01-30 – 2024-02-08 (×10): 1 via ORAL
  Filled 2024-01-29 (×10): qty 1

## 2024-01-29 MED ORDER — ACETAMINOPHEN 325 MG PO TABS
650.0000 mg | ORAL_TABLET | Freq: Four times a day (QID) | ORAL | Status: DC | PRN
  Administered 2024-01-30 – 2024-02-07 (×13): 650 mg via ORAL
  Filled 2024-01-29 (×14): qty 2

## 2024-01-29 MED ORDER — ENSURE MAX PROTEIN PO LIQD
11.0000 [oz_av] | Freq: Every day | ORAL | Status: DC
  Administered 2024-01-30 – 2024-02-07 (×7): 11 [oz_av] via ORAL

## 2024-01-29 NOTE — Discharge Instructions (Signed)
 Jerry [name],  You were in the hospital from a very bad urinary tract infection that got into your blood stream. You improved with antibiotics, stents and other medications to stabilize your body. Your hospitalization was further complicated by an irregular heart rhythm called atrial fibrillation / atrial flutter. The cardiologist has optimized your medications for discharge.

## 2024-01-29 NOTE — Progress Notes (Addendum)
 Progress Note  Patient Name: Jerry [name] Date of Encounter: 01/29/2024 [facility] Cardiologist: None   Interval Summary   Wife and son are accompanied with him by the bedside.  He is feeling much improved today, only complaining of hiccups.  No chest pain or shortness of breath.  Converted to sinus rhythm around 730 this morning.  Vital Signs Vitals:   01/28/24 0935 01/28/24 2037 01/29/24 0528 01/29/24 0635  BP: (!) 116/57 (!) 110/52 (!) 125/57   Pulse: 98 83 81   Resp: 18 16 16    Temp: 97.9 F (36.6 C) (!) 97.4 F (36.3 C) 97.6 F (36.4 C)   TempSrc: Oral Oral Oral   SpO2: 96% 99% 97%   Weight:    70.8 kg  Height:        Intake/Output Summary (Last 24 hours) at 01/29/2024 0902 Last data filed at 01/29/2024 0100 Gross per 24 hour  Intake 120 ml  Output 600 ml  Net -480 ml      01/29/2024    6:35 AM 01/28/2024    5:00 AM 01/27/2024    5:00 AM  Last 3 Weights  Weight (lbs) 156 lb 1.4 oz 156 lb 4.9 oz 159 lb 13.3 oz  Weight (kg) 70.8 kg 70.9 kg 72.5 kg      Telemetry/ECG  Atrial fib/flutter NSR 0736- Personally Reviewed  Physical Exam  GEN: No acute distress.   Neck: No JVD Cardiac: RRR, 2 out of 6 murmur LSB  respiratory: Poor respiratory effort but sounds clear  GI: Soft, nontender, non-distended. MS: No edema  Patient Profile Patient with past medical history significant for AAA, hyperlipidemia, hypertension, MCTD, popliteal aneurysm status post stent 2024 and recent fall with subdural hemorrhage discharged from rehab 2 weeks ago.  Cardiology seen for new onset atrial flutter and acute HFrEF in the setting of septic shock secondary to obstructive uropathy, acute pyelonephritis requiring ICU level care and temporarily on vasopressors and required intubation. Status post urgent bilateral ureteral stent placements.  Assessment & Plan   New onset atrial fibrillation/flutter RBBB No prior history of atrial fibrillation/flutter.  At around 0736  today he converted back to sinus rhythm. Not on anticoagulation given recent subdural hematoma.  CHA2DS2-VASc 5, see below conversation. Continue Toprol -XL 50 mg daily. Elevated free T4 with low T3. Discordant.  Plan to repeat study as outpatient. Recommend starting amiodarone  to maintain sinus rhythm as he recovers from acute illness.  Recommend amiodarone  200 mg twice daily x 2 weeks, then reduce to 200 mg daily x 2 weeks then discontinue  Anticoagulation Patient currently has indications for anticoagulation given atrial fibrillation/atrial flutter however this might be one-time episode exacerbated in the acute setting of critical illnesses.  He has only been in sinus rhythm for couple hours now.  They do have history of subdermal hematoma 11/2023 with 2 recent falls but family reports fall hx is not generally a concern and very functional at home. Additionally, they have been stable on aspirin  and Plavix  for history of stent placement for PAD.  Seems reasonable that they may be able to tolerate Eliquis.  I think obtaining 30-day heart monitor at discharge to further define atrial fibrillation burden would help guide decision on anticoagulation.  For now agree with holding since in NSR. Will defer to MD.  Acute HFrEF May be secondary to the above and/or stress cardiomyopathy secondary to septic shock.  Echocardiogram this admission demonstrates an EF of 35 to 40% with global hypokinesis.  Moderately reduced RV.  Mildly dilated LA.  He has been diuresing on IV Lasix  40 mg daily.  Today he was euvolemic and no longer short of breath. I think 1 more dose of IV Lasix  40 mg is fine, likely able to stop IV diuretics tomorrow and start spironolactone.  Continue Toprol -XL 25 mg daily. Repeat echocardiogram in 2 to 3 months.  Low-flow low gradient mild aortic stenosis Monitor outpatient.  PAD Status post stenting of the popliteal aneurysm 06/2022.  Will need to discuss with vascular about whether or not he  still needs to be on DAPT therapy with aspirin  and Plavix  at this point. Continue atorvastatin  40 mg daily.  AAA Follow outpatient  Septic shock Pyelonephritis with obstructive uropathy E. coli bacteremia Initially required ICU level care and has been off vasopressor support.  Status post bilateral ureteral stents 11/29. Clinically looks improved/resolved.   For questions or updates, please contact [facility] Please consult [email or web address] for contact info under       Signed, Thom LITTIE Sluder, PA-C     Patient seen and examined.  Agree with above documentation.  On exam, patient is alert, regular rate and rhythm, 2 out of 6 systolic murmurs, lungs CTAB, no LE edema or JVD.  Converted to sinus rhythm today.  Start short course of amiodarone  to maintain sinus rhythm as he recovers from acute illness.  Appears euvolemic, would stop IV Lasix   Lonni LITTIE Nanas, MD

## 2024-01-29 NOTE — Progress Notes (Signed)
 Babs Arthea DASEN, MD  Physician Physical Medicine and Rehabilitation PMR Pre-admission    Signed Date of Service:  01/26/2024 12:38 PM  Related encounter: ED to Hosp-Admission (Current) from 01/19/2024 in [facility]   Signed      Expand All Collapse All PMR Admission Coordinator Pre-Admission Assessment   Patient: Jerry [name] is an 89 y.o., male MRN: [id number] DOB: [birth year] Height: 5' 5 (165.1 cm) Weight: 70.8 kg   Insurance Information HMO:     PPO:      PCP:      IPA:      80/20:      OTHER:  PRIMARY: Medicare Part A and B      Policy#: [id number]      Subscriber: pt Benefits:  Phone #: passport one source online 12/3     Name:  Eff. Date: 02/22/1995     Deduct: $1676      Out of Pocket Max: none      Life Max: none CIR: 100%      SNF: 20 full days Outpatient: 80%     Co-Pay: 20% Home Health: 100%      Co-Pay: none DME: 80%     Co-Pay: 20% Providers: pt choice  SECONDARY: BCBS supplement      Policy#: [id number]     Phone#: [phone]   Financial Counselor:       Phone#:    The "Data Collection Information Summary" for patients in Inpatient Rehabilitation Facilities with attached "Privacy Act Statement-Health Care Records" was provided and verbally reviewed with: Patient and Family   Emergency Contact Information Contact Information       Name Relation Home Work Mobile    Vonada,GLENDA Spouse [phone]   [phone]    Jost,WIliam [name] Blades     [phone]         Other Contacts       Name Relation Home Work Mobile    Cannon,Cara Daughter     [phone]           Current Medical History  Patient Admitting Diagnosis: urosepsis, debility   History of Present Illness: Jerry [name] is a 89 year old right-handed male with history significant for AAA, anemia of chronic disease hearing loss, hypertension, hyperlipidemia, macular degeneration, popliteal aneurysm status post stenting by vascular surgery  2024 maintained on aspirin  and Plavix  per Dr. Selinda Gu, traumatic subdural hematoma after a fall as well as nondisplaced right clavicle fracture and received CIR 12/05/2023 - 12/19/2023 and was discharged to home with family requiring supervision for bed mobility and ambulates household distances with a rollator and arrangements were made for home health therapies.   Presented 01/19/2024 after being found down after hearing a loud noise in the bathroom patient noted to have altered mental status.     In the ED patient was tachycardic as well as hypotensive with fever of 103 cranial CT scan showed no acute intracranial abnormality.  No skull fracture.  No mass effect or midline shift.  CT cervical spine negative.  CT of the chest abdomen and pelvis showed 2 mm calculus in the mid left ureter with mild obstructive uropathy.  Multiple healing/healed bilateral rib fractures.  There was question acute nondisplaced anterior right second rib fracture.  Age-indeterminate posterior left 10 th rib fracture.  Stable abdominal aortic aneurysm measuring 4 cm.  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  Admission chemistries unremarkable except CO2 16 glucose 120, creatinine 1.75,  AST 54, WBC 14,900, hemoglobin 12.5, platelets 103,000, blood cultures E. coli, BNP 292.3, lactic acid 5.1-7.0, troponin elevated 147-125.  Patient was placed on broad-spectrum antibiotic with vancomycin  and Zosyn  for sepsis as well as receiving 2 L LR boluses, Duo Neb and Solu-Medrol .     Urology Dr. Norleen Seltzer consulted in regards to bilateral ureteral stones with left obstruction undergoing cystoscopy with bilateral retrograde pyelography and bilateral ureteral stents 01/20/2024.  Hospital course septic shock secondary to acute pyelonephritis and obstructive uropathy with evidence of E. coli bacteremia.  Patient initially required ICU admission was started on dobutamine , vasopressin , phenylephrine  and  norepinephrine .  Vasopressors weaned off with treatment of infection.  His empiric vancomycin  and Zosyn  transition to ceftriaxone  with leukocytosis initially peaked to 43,600 improved to 26,600-21,500.  His Foley tube remains in place mild hematuria and monitored with hemoglobin stable 11.5.    New onset HFrEF question etiology felt troponin positive related to demand ischemia/sepsis.  Transthoracic echocardiogram significant for LVEF of 35 to 40% with global hypokinesis.  Patient did receive diuresis.  Lactic acidosis resolved with management of sepsis.  AKI complicated by mild hydronephrosis from ureteral obstruction improved with latest creatinine 0.89.  Incidental findings of right upper extremity SVT diagnosed 12/3 with supportive care.  Patient developed new onset atrial fibrillation 01/25/2024 with cardiology services follow-up he was placed on Toprol -XL 25 mg daily.  No anticoagulation at this time contraindicated due to recent subdural bleed.  In regards to patient's history of PAD managed on DAPT for stent x 2 SFA/popliteal artery antiplatelets held secondary to sepsis and thrombocytopenia with latest platelet count 53,000.  Pressure injury of right/mid/left sacrum with wound care as directed.  He is tolerating a regular consistency diet.     Patient's medical record from [facility] has been reviewed by the rehabilitation admission coordinator and physician.   Past Medical History      Past Medical History:  Diagnosis Date   AAA (abdominal aortic aneurysm)     Aneurysm      History of multiple vascular aneurysms. [name] involve the aorta, te iliac arteries, and the popliteal arteries.  The pt is s/p stent graft of an abdominal aortic aneurysm. All of his aneurysm follow up as been done at [facility].   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 This is manifested and is elevated, sed rate, pleuritis, Raynaud's pehnomenon and she does have a positive  rheumatoid factor.  He is being treated with Plquenil for his collagen vascular disease   Colonic polyp     History of nephrolithiasis     Hyperlipidemia     Hypertension     Macular degeneration     Osteoarthrosis, hip      Left hip   Paget's disease          Has the patient had major surgery during 100 days prior to admission? Yes   Family History   family history includes Alzheimer's disease in his father; Heart attack (age of onset: 57) in his mother.   Current Medications  Current Medications    Current Facility-Administered Medications:    acetaminophen  (TYLENOL ) tablet 650 mg, 650 mg, Oral, Q6H PRN, Briana Elgin LABOR, MD, 650 mg at 01/29/24 1142   amiodarone  (PACERONE ) tablet 200 mg, 200 mg, Oral, BID **FOLLOWED BY** [START ON 02/12/2024] amiodarone  (PACERONE ) tablet 200 mg, 200 mg, Oral, Daily, Kate Lonni CROME, MD   aspirin  EC tablet 81 mg, 81 mg, Oral,  Daily, Briana Elgin LABOR, MD, 81 mg at 01/29/24 1017   atorvastatin  (LIPITOR) tablet 40 mg, 40 mg, Oral, Daily, Albustami, Omar M, MD, 40 mg at 01/29/24 1016   Chlorhexidine  Gluconate Cloth 2 % PADS 6 each, 6 each, Topical, Daily, Adolph Tinnie BRAVO, PA-C, 6 each at 01/29/24 1024   chlorpheniramine-HYDROcodone (TUSSIONEX) 10-8 MG/5ML suspension 5 mL, 5 mL, Oral, QHS PRN, Briana Elgin LABOR, MD, 5 mL at 01/24/24 2041   clopidogrel  (PLAVIX ) tablet 75 mg, 75 mg, Oral, Daily, Briana Elgin LABOR, MD, 75 mg at 01/29/24 1016   docusate sodium  (COLACE) capsule 100 mg, 100 mg, Oral, BID PRN, Autry, Aakash Hollomon E, PA-C, 100 mg at 01/26/24 1054   famotidine  (PEPCID ) tablet 20 mg, 20 mg, Oral, Daily, Hussein, Lenny, MD, 20 mg at 01/29/24 1016   feeding supplement (ENSURE PLUS HIGH PROTEIN) liquid 237 mL, 237 mL, Oral, TID BM, Ramaswamy, Murali, MD, 237 mL at 01/29/24 1024   fentaNYL  (SUBLIMAZE ) injection 12.5-25 mcg, 12.5-25 mcg, Intravenous, Q2H PRN, Geronimo Amel, MD, 25 mcg at 01/21/24 0342   gabapentin  (NEURONTIN ) capsule 100 mg, 100  mg, Oral, TID, Briana Elgin LABOR, MD, 100 mg at 01/29/24 1141   ipratropium-albuterol  (DUONEB) 0.5-2.5 (3) MG/3ML nebulizer solution 3 mL, 3 mL, Nebulization, Q4H PRN, Briana Elgin LABOR, MD   melatonin tablet 5 mg, 5 mg, Oral, QHS PRN, Paliwal, Aditya, MD, 5 mg at 01/23/24 2108   metoprolol  succinate (TOPROL -XL) 24 hr tablet 50 mg, 50 mg, Oral, Daily, Turner, Traci R, MD, 50 mg at 01/29/24 1016   multivitamin with minerals tablet 1 tablet, 1 tablet, Oral, Daily, Albustami, Omar M, MD, 1 tablet at 01/29/24 1017   Oral care mouth rinse, 15 mL, Mouth Rinse, PRN, Albustami, Omar M, MD   phenol (CHLORASEPTIC) mouth spray 1 spray, 1 spray, Mouth/Throat, PRN, Sharie Lenny, MD, 1 spray at 01/26/24 1044   polyethylene glycol (MIRALAX  / GLYCOLAX ) packet 17 g, 17 g, Oral, Daily PRN, Briana Elgin LABOR, MD   protein supplement (ENSURE MAX) liquid, 11 oz, Oral, Daily, Angiulli, Toribio PARAS, PA-C, 11 oz at 01/29/24 1024   senna-docusate (Senokot-S) tablet 1 tablet, 1 tablet, Oral, QHS, Albustami, Omar M, MD, 1 tablet at 01/26/24 2034   tamsulosin  (FLOMAX ) capsule 0.4 mg, 0.4 mg, Oral, Daily, Briana Elgin LABOR, MD, 0.4 mg at 01/29/24 1016     Patients Current Diet:  Diet Order                  Diet 2 gram sodium Room service appropriate? Yes; Fluid consistency: Thin; Fluid restriction: 1500 mL Fluid  Diet effective now                         Precautions / Restrictions Precautions Precautions: Fall Precaution/Restrictions Comments: watch HR Restrictions Weight Bearing Restrictions Per Provider Order: Yes RUE Weight Bearing Per Provider Order: Weight bearing as tolerated Other Position/Activity Restrictions: NWB RUE from fx R clavicle 7.5 weeks ago, Dr Sharie Attending agreeable to progress to [facility] and to note any pain    Has the patient had 2 or more falls or a fall with injury in the past year? Yes   Prior Activity Level Community (5-7x/wk): was Mod I RW and Supervision on stairs after CIR 10/25    Prior Functional Level Self Care: Did the patient need help bathing, dressing, using the toilet or eating? Needed some help   Indoor Mobility: Did the patient need assistance with walking from room to room (  with or without device)? Independent   Stairs: Did the patient need assistance with internal or external stairs (with or without device)? Needed some help   Functional Cognition: Did the patient need help planning regular tasks such as shopping or remembering to take medications? Needed some help   Patient Information Are you of Hispanic, Latino/a,or Spanish origin?: A. No, not of Hispanic, Latino/a, or Spanish origin What is your race?: A. White Do you need or want an interpreter to communicate with a doctor or health care staff?: 0. No   Patient's Response To:  Health Literacy and Transportation Is the patient able to respond to health literacy and transportation needs?: Yes Health Literacy - How often do you need to have someone help you when you read instructions, pamphlets, or other written material from your doctor or pharmacy?: Rarely In the past 12 months, has lack of transportation kept you from medical appointments or from getting medications?: No In the past 12 months, has lack of transportation kept you from meetings, work, or from getting things needed for daily living?: No   Home Assistive Devices / Equipment Home Equipment: Grab bars - tub/shower, Agricultural Consultant (2 wheels), Toilet riser, Other (comment), Wheelchair - manual, Rollator (4 wheels), Shower seat, Hand held shower head (hurrycane; urinal; bed rails)   Prior Device Use: Indicate devices/aids used by the patient prior to current illness, exacerbation or injury? Walker   Current Functional Level Cognition   Orientation Level: Oriented to person, Oriented to place, Oriented to situation, Disoriented to time    Extremity Assessment (includes Sensation/Coordination)   Upper Extremity Assessment: Generalized  weakness, RUE deficits/detail RUE Deficits / Details: recent clavicle fx with noted decr shoulder ROM with reaching tasks in standing 0-60 degrees  Lower Extremity Assessment: Defer to PT evaluation     ADLs   Overall ADL's : Needs assistance/impaired Eating/Feeding: Set up, Supervision/ safety, Sitting Eating/Feeding Details (indicate cue type and reason): in recliner Grooming: Set up, Sitting, Supervision/safety Grooming Details (indicate cue type and reason): EOB without spinal support Upper Body Bathing: Set up, Supervision/ safety, Sitting Upper Body Bathing Details (indicate cue type and reason): in recliner Lower Body Bathing: Maximal assistance Lower Body Bathing Details (indicate cue type and reason): Mod A +2 sit<>stand Upper Body Dressing : Moderate assistance, Sitting Upper Body Dressing Details (indicate cue type and reason): in recliner Lower Body Dressing: Maximal assistance Lower Body Dressing Details (indicate cue type and reason): Mod A +2 sit<>stand Toilet Transfer: Minimal assistance, Ambulation, Rolling walker (2 wheels), Contact guard assist Toilet Transfer Details (indicate cue type and reason): one handed A from bed to recliner with +2 for safety/A Toileting- Clothing Manipulation and Hygiene: Maximal assistance Toileting - Clothing Manipulation Details (indicate cue type and reason): Mod A +2 sit<>stand Functional mobility during ADLs: Minimal assistance, Rolling walker (2 wheels)     Mobility   Overal bed mobility: Needs Assistance Bed Mobility: Supine to Sit Supine to sit: Min assist Sit to supine: Min assist General bed mobility comments: Verbal cues for execution, i.e. reaching over for rail. MinA with bed pad to shift R hip out to edge of bed     Transfers   Overall transfer level: Needs assistance Equipment used: Rolling walker (2 wheels) Transfers: Sit to/from Stand Sit to Stand: Min assist, Contact guard assist Bed to/from chair/wheelchair/BSC  transfer type:: Step pivot Step pivot transfers: Min assist General transfer comment: CGA from edge of bed, minA from lower toilet height surface. Verbal cues for hand placement.  noted decreased eccentric control     Ambulation / Gait / Stairs / Wheelchair Mobility   Ambulation/Gait Ambulation/Gait assistance: Min assist, Contact guard assist Gait Distance (Feet): 150 Feet Assistive device: Rolling walker (2 wheels) Gait Pattern/deviations: Decreased stride length, Trunk flexed, Shuffle General Gait Details: Shuffling gait pattern with crouched posture, intermittent minA for obstacle negotiation and verbal cues for environmental navigation Gait velocity: reduced Gait velocity interpretation: <1.8 ft/sec, indicate of risk for recurrent falls     Posture / Balance Dynamic Sitting Balance Sitting balance - Comments: right lateral lean, VCs to correct and pt unaware Balance Overall balance assessment: Needs assistance Sitting-balance support: Single extremity supported, Feet supported Sitting balance-Leahy Scale: Good Sitting balance - Comments: right lateral lean, VCs to correct and pt unaware Standing balance support: Bilateral upper extremity supported Standing balance-Leahy Scale: Poor Standing balance comment: reliant on external support     Special considerations/life events  Fall precautions WOC consulted 12/2 due to sacrum Dr Watt placed double lumen latex 16 fr cath on 11/28 Left and right ureter 6 fr stents placed on 11/28                                                              [facility] Previous Home Environment  Living Arrangements: Spouse/significant other  Lives With: Spouse Available Help at Discharge: Family, Available 24 hours/day Type of Home: House Home Layout: Two level, Able to live on main level with bedroom/bathroom Alternate Level Stairs-Number of Steps: flight Home Access: Stairs to enter Entrance Stairs-Rails: Left Entrance Stairs-Number of Steps:  3 Bathroom Shower/Tub: Psychologist, counselling, Sport And Exercise Psychologist: Standard Bathroom Accessibility: Yes How Accessible: Accessible via walker Home Care Services: Yes Type of Home Care Services: Home OT, Home PT Home Care Agency (if known): [facility] 10/25 Additional Comments: CIR admit 10/25, home at overall supervision level after 13 days   Discharge Living Setting Plans for Discharge Living Setting: Patient's home, House, Lives with (comment) (wife) Type of Home at Discharge: House Discharge Home Layout: Two level, Able to live on main level with bedroom/bathroom Alternate Level Stairs-Number of Steps: flight Discharge Home Access: Stairs to enter Entrance Stairs-Rails: Left Entrance Stairs-Number of Steps: 3 Discharge Bathroom Shower/Tub: Walk-in shower, Door Discharge Bathroom Toilet: Standard Discharge Bathroom Accessibility: Yes How Accessible: Accessible via walker Does the patient have any problems obtaining your medications?: No   Social/Family/Support Systems Patient Roles: Spouse, Parent Contact Information: wife, Glenda Anticipated Caregiver: wife, daughter Anticipated Industrial/product Designer Information: see contacts Ability/Limitations of Caregiver: no limitations notied Caregiver Availability: 24/7 Discharge Plan Discussed with Primary Caregiver: Yes Is Caregiver In Agreement with Plan?: Yes Does Caregiver/Family have Issues with Lodging/Transportation while Pt is in Rehab?: No   Goals Patient/Family Goal for Rehab: supervision PT, supervision to min OT Expected length of stay: ELOS 10 to 14 days Additional Information: CIR admit 10/25 for 13 days and went home at overall supervision level Pt/Family Agrees to Admission and willing to participate: Yes Program Orientation Provided & Reviewed with Pt/Caregiver Including Roles  & Responsibilities: Yes   Decrease burden of Care through IP rehab admission: n/a   Possible need for SNF placement upon discharge: not anticipated    Patient Condition: I have reviewed medical records from [facility], spoken with  patient, spouse, son, and daughter. I  met with patient at the bedside for inpatient rehabilitation assessment.  Patient will benefit from ongoing PT and OT, can actively participate in 3 hours of therapy a day 5 days of the week, and can make measurable gains during the admission.  Patient will also benefit from the coordinated team approach during an Inpatient Acute Rehabilitation admission.  The patient will receive intensive therapy as well as Rehabilitation physician, nursing, social worker, and care management interventions.  Due to bladder management, bowel management, safety, skin/wound care, disease management, medication administration, pain management, and patient education the patient requires 24 hour a day rehabilitation nursing.  The patient is currently CGA-Min A with mobility and basic ADLs.  Discharge setting and therapy post discharge at home with home health is anticipated.  Patient has agreed to participate in the Acute Inpatient Rehabilitation Program and will admit today.   Preadmission Screen Completed By:  Alison Heron Chrys OBIE MSN 01/29/2024 1:43 PM ______________________________________________________________________   Discussed status with Dr. Babs on 01/29/24  at 1:43 PM and received approval for admission today.   Admission Coordinator:  Alison Heron Chrys, RN MSN time 1:43 PM/Date 01/29/24     Assessment/Plan: Diagnosis: debility due to urosepsis Does the need for close, 24 hr/day Medical supervision in concert with the patient's rehab needs make it unreasonable for this patient to be served in a less intensive setting? Yes Co-Morbidities requiring supervision/potential complications: AAA, ACD, HTN, mac degeneration, obstructive uropathy Due to bladder management, bowel management, safety, skin/wound care, disease management, medication administration, pain management,  and patient education, does the patient require 24 hr/day rehab nursing? Yes Does the patient require coordinated care of a physician, rehab nurse, PT, OT, and SLP to address physical and functional deficits in the context of the above medical diagnosis(es)? Yes Addressing deficits in the following areas: balance, endurance, locomotion, strength, transferring, bowel/bladder control, bathing, dressing, feeding, grooming, toileting, and psychosocial support Can the patient actively participate in an intensive therapy program of at least 3 hrs of therapy 5 days a week? Yes The potential for patient to make measurable gains while on inpatient rehab is excellent Anticipated functional outcomes upon discharge from inpatient rehab: supervision PT, supervision and min assist OT, n/a SLP Estimated rehab length of stay to reach the above functional goals is: 10-14 days Anticipated discharge destination: Home 10. Overall Rehab/Functional Prognosis: excellent     MD Signature: Arthea IVAR Babs, MD, [facility] Physical Medicine & Rehabilitation Medical Director Rehabilitation Services 01/29/2024          Revision History  Date/Time User Provider Type Action  01/29/2024  1:50 PM Babs Arthea DASEN, MD Physician Sign  01/29/2024  1:43 PM Yvone Delayne Tinnie SHAUNNA, CCC-SLP Rehab Admission Coordinator Share  01/26/2024 12:46 PM Alison Heron MATSU, RN Rehab Admission Coordinator Share

## 2024-01-29 NOTE — Plan of Care (Signed)
 Wound Plan   Wounds present:  1:bilat buttocks and sacrum is darker red-purple colored; appearance is consistent with chronic tissue damage, related to prolonged sitting and shear. Affected area is approx 5X5cm 2:Left buttock with black linear Deep tissue pressure injury; 1X.2cm with intact skin.  This was noted as present on admission by the bedside nurses' wound care flow sheet.   Deep tissue pressure injuries are high risk to evolve into full thickness tissue damage within 7-10 days.    Interventions:  Air loss mattress Air redistribution cushion for wheelchair WOC assessment: Apply Xeroform gauze to sacrum Q day, then cover with foam dressing.  Change foam dressing Q 3 days or PRN .   Dietician consult:  Liberalize diet to regular in the setting of advanced age Eating > 75% of meals Ensure Plus High Protein po BID,  Magic cup TID with meals, each  Continue MVI with minerals daily Thiamine  daily  Braden Score: 15 Sensory: 3 Moisture: 3 Activity: 3 Mobility: 3 Nutrition: 2 Friction: 1  Contributors: Dawn Manufacturing Engineer, RN, CWOCN,CWCN-AP, CNS  Royce Maris, RDN, LDN  Barnie Ronde BSN, RN - BC, CRRN

## 2024-01-29 NOTE — H&P (Signed)
 Physical Medicine and Rehabilitation Admission H&P        Chief Complaint  Patient presents with   Fall   Altered Mental Status  : HPI: Jerry [name] is a 89 year old right-handed male with history significant for AAA, anemia of chronic disease hearing loss, hypertension, hyperlipidemia, macular degeneration, popliteal aneurysm status post stenting by vascular surgery 2024 maintained on aspirin  and Plavix  per Dr. Selinda Gu, traumatic subdural hematoma after a fall as well as nondisplaced right clavicle fracture and received CIR 12/05/2023 - 12/19/2023 and was discharged to home with family requiring supervision for bed mobility and ambulates household distances with a rollator and arrangements were made for home health therapies.  Per chart review patient lives with spouse.  Two-level home bed and bath main level with 3 steps to from entry of home.  Wife does assist with some basic ADLs.  Presented 01/19/2024 after being found down after hearing a loud noise in the bathroom patient noted to have altered mental status.  In the ED patient was tachycardic as well as hypotensive with fever of 103 cranial CT scan showed no acute intracranial abnormality.  No skull fracture.  No mass effect or midline shift.  CT cervical spine negative.  CT of the chest abdomen and pelvis showed 2 mm calculus in the mid left ureter with mild obstructive uropathy.  Multiple healing/healed bilateral rib fractures.  There was question acute nondisplaced anterior right second rib fracture.  Age-indeterminate posterior left 10th rib fracture.  Stable abdominal aortic aneurysm measuring 4 cm.  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  Admission chemistries unremarkable except CO2 16 glucose 120, creatinine 1.75, AST 54, WBC 14,900, hemoglobin 12.5, platelets 103,000, blood cultures E. coli, BNP 292.3, lactic acid 5.1-7.0, troponin elevated 147-125.  Patient was placed on broad-spectrum  antibiotic with vancomycin  and Zosyn  for sepsis as well as receiving 2 L LR boluses, DuoNeb and Solu-Medrol .  Urology Dr. Norleen Seltzer consulted in regards to bilateral ureteral stones with left obstruction undergoing cystoscopy with bilateral retrograde pyelography and bilateral ureteral stents 01/20/2024.  Hospital course septic shock secondary to acute pyelonephritis and obstructive uropathy with evidence of E. coli bacteremia.  Patient initially required ICU admission was started on dobutamine , vasopressin , phenylephrine  and norepinephrine .  Vasopressors weaned off with treatment of infection.  His empiric vancomycin  and Zosyn  transition to ceftriaxone  with leukocytosis initially peaked to 43,600 improved to 26,600-21,500.  His Foley tube remains in place mild hematuria and monitored with hemoglobin stable 11.5.  New onset HFrEF question etiology felt troponin positive related to demand ischemia/sepsis.  Transthoracic echocardiogram significant for LVEF of 35 to 40% with global hypokinesis.  Patient did receive diuresis.  Lactic acidosis resolved with management of sepsis.  AKI complicated by mild hydronephrosis from ureteral obstruction improved with latest creatinine 0.89.  Incidental findings of right upper extremity SVT diagnosed 12/3 with supportive care.  Patient developed new onset atrial fibrillation 01/25/2024 with cardiology services follow-up he was placed on Toprol -XL 25 mg daily.  No anticoagulation at this time contraindicated due to recent subdural bleed.  In regards to patient's history of PAD managed on DAPT for stent x 2 SFA/popliteal artery antiplatelets held initially secondary to sepsis and thrombocytopenia with latest platelet count 175,000 and Plavix  and aspirin  have been resumed as of 01/27/2024.  Pressure injury of right/mid/left sacrum with wound care as directed.  He is tolerating a regular consistency diet.  Therapy evaluations completed due to patient's decreased functional  ability/septic shock  was admitted for a comprehensive rehab program.   Review of Systems  Constitutional:  Positive for fever and malaise/fatigue.  HENT:  Positive for hearing loss.   Eyes:  Positive for blurred vision.  Respiratory:  Negative for cough, shortness of breath and wheezing.   Cardiovascular:  Negative for chest pain, palpitations and leg swelling.  Gastrointestinal:  Positive for constipation. Negative for heartburn, nausea and vomiting.  Genitourinary:  Positive for hematuria and urgency. Negative for dysuria and flank pain.  Musculoskeletal:  Positive for falls, joint pain and myalgias.  Neurological:  Positive for weakness.  All other systems reviewed and are negative.      Past Medical History:  Diagnosis Date   AAA (abdominal aortic aneurysm)     Aneurysm      History of multiple vascular aneurysms. [name] involve the aorta, te iliac arteries, and the popliteal arteries.  The pt is s/p stent graft of an abdominal aortic aneurysm. All of his aneurysm follow up as been done at [facility].   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 This is manifested and is elevated, sed rate, pleuritis, Raynaud's pehnomenon and she does have a positive rheumatoid factor.  He is being treated with Plquenil for his collagen vascular disease   Colonic polyp     History of nephrolithiasis     Hyperlipidemia     Hypertension     Macular degeneration     Osteoarthrosis, hip      Left hip   Paget's disease               Past Surgical History:  Procedure Laterality Date   ABDOMINAL AORTIC ANEURYSM REPAIR       CYSTOSCOPY W/ URETERAL STENT PLACEMENT Bilateral 01/19/2024    Procedure: CYSTOSCOPY, WITH RETROGRADE PYELOGRAM AND URETERAL STENT INSERTION;  Surgeon: Watt Rush, MD;  Location: [facility] OR;  Service: Urology;  Laterality: Bilateral;  BILATERAL PYELOGRAM, POSSIBLE RIGHT URETERAL STENT PLACEMENT   DOPPLER ECHOCARDIOGRAPHY   2006    Ef greater than 60%.  There are no regional wall motion abnormalities. There was mild mitral regurgitation, there is no aortic stenosis, aortic valve was mildly calcified   HERNIA REPAIR   1998   LOWER EXTREMITY ANGIOGRAPHY Left 01/30/2023    Procedure: Lower Extremity Angiography;  Surgeon: Marea Selinda RAMAN, MD;  Location: [facility] INVASIVE CV LAB;  Service: Cardiovascular;  Laterality: Left;   Lung Hamartoma                 Family History  Problem Relation Age of Onset   Heart attack Mother 77        MI   Alzheimer's disease Father          Social History:  reports that he has quit smoking. He has never used smokeless tobacco. He reports current alcohol use. No history on file for drug use. Allergies:  Allergies       Allergies  Allergen Reactions   Ivp Dye [Iodinated Contrast Media]     Simvastatin        REACTION: joint pains   Soap Itching      fragrant soaps            Medications Prior to Admission  Medication Sig Dispense Refill   acetaminophen  (TYLENOL ) 500 MG tablet Take 2 tablets (1,000 mg total) by mouth every 6 (six) hours as needed. 30 tablet 0   aspirin  EC 81 MG tablet Take 1 tablet (  81 mg total) by mouth daily. Swallow whole.       atorvastatin  (LIPITOR) 40 MG tablet Take 1 tablet (40 mg total) by mouth daily. 30 tablet 0   clopidogrel  (PLAVIX ) 75 MG tablet Take 1 tablet (75 mg total) by mouth daily. 30 tablet 0   diltiazem  (CARDIZEM  CD) 240 MG 24 hr capsule Take 1 capsule (240 mg total) by mouth daily. 30 capsule 0   Multiple Vitamin (MULTIVITAMIN PO) Take 1 tablet by mouth daily.       Multiple Vitamins-Minerals (PRESERVISION AREDS PO) Take 1 capsule by mouth 2 (two) times daily.       tamsulosin  (FLOMAX ) 0.4 MG CAPS capsule Take 1 capsule (0.4 mg total) by mouth daily after supper. 30 capsule 0   traZODone  (DESYREL ) 50 MG tablet Take 1 tablet (50 mg total) by mouth at bedtime. (Patient taking differently: Take 25 mg by mouth at bedtime.) 30 tablet 0   diclofenac  Sodium (VOLTAREN ) 1 %  GEL Apply 2 g topically 4 (four) times daily. (Patient not taking: Reported on 01/21/2024) 100 g 0   lidocaine  (LIDODERM ) 5 % Place 2 patches onto the skin daily. Remove & Discard patch within 12 hours or as directed by MD (Patient not taking: Reported on 01/21/2024) 30 patch 0   oxyCODONE  (OXY IR/ROXICODONE ) 5 MG immediate release tablet Take 1 tablet (5 mg total) by mouth every 4 (four) hours as needed for moderate pain (pain score 4-6) or breakthrough pain. (Patient not taking: Reported on 01/21/2024) 30 tablet 0   polyethylene glycol (MIRALAX  / GLYCOLAX ) 17 g packet Take 17 g by mouth daily as needed for moderate constipation. (Patient not taking: Reported on 01/21/2024)       senna-docusate (SENOKOT-S) 8.6-50 MG tablet Take 1 tablet by mouth at bedtime. (Patient not taking: Reported on 01/21/2024)       topiramate  (TOPAMAX ) 25 MG tablet Take 1 tablet (25 mg total) by mouth 2 (two) times daily as needed (headache). (Patient not taking: Reported on 01/21/2024) 30 tablet 0              Home: Home Living Family/patient expects to be discharged to:: Private residence Living Arrangements: Spouse/significant other Available Help at Discharge: Family, Available 24 hours/day Type of Home: House Home Access: Stairs to enter [employer] of Steps: 3 Entrance Stairs-Rails: Left Home Layout: Two level, Able to live on main level with bedroom/bathroom Alternate Level Stairs-Number of Steps: flight Bathroom Shower/Tub: Psychologist, counselling, Door [employer] Toilet: Standard Bathroom Accessibility: Yes Home Equipment: Grab bars - tub/shower, Agricultural Consultant (2 wheels), Toilet riser, Other (comment), Wheelchair - manual, Rollator (4 wheels), Shower seat, Hand held shower head (hurrycane; urinal; bed rails) Additional Comments: CIR admit 10/25, home at overall supervision level after 13 days  Lives With: Spouse   Functional History: Prior Function Prior Level of Function : Independent/Modified  Independent Mobility Comments: Once returned from [facility] recently he was mod I using rollator; supervision on stairs ADLs Comments: Since discharge from [facility], wife has been helping with dressing upper and lower half, but pt has been mod I bathing; has not been driving recently   Functional Status:  Mobility: Bed Mobility Overal bed mobility: Needs Assistance Bed Mobility: Supine to Sit Supine to sit: Min assist Sit to supine: Min assist General bed mobility comments: min A to elevate trunk and scoot fully out to EOB. min A to manage LEs back to bed at end of session Transfers Overall transfer level: Needs assistance Equipment used: Rolling walker (  2 wheels) Transfers: Sit to/from Stand, Bed to chair/wheelchair/BSC Sit to Stand: Min assist, Contact guard assist Bed to/from chair/wheelchair/BSC transfer type:: Step pivot Step pivot transfers: Min assist General transfer comment: min A to power up from EOB at lowest heihgt, CGA on second attempt with cues for hand placement on each rise Ambulation/Gait Ambulation/Gait assistance: Min assist, Contact guard assist Gait Distance (Feet): 150 Feet Assistive device: Rolling walker (2 wheels) Gait Pattern/deviations: Decreased step length - right, Decreased step length - left, Decreased stride length General Gait Details: pt taking slow small steps with flexed trunk, pt able to make corrective changes but unable to maintain, cues for cloaser Rw proximity. pt with R drift with fatigue Gait velocity: reduced Gait velocity interpretation: <1.31 ft/sec, indicative of household ambulator   ADL: ADL Overall ADL's : Needs assistance/impaired Eating/Feeding: Set up, Supervision/ safety, Sitting Eating/Feeding Details (indicate cue type and reason): in recliner Grooming: Set up, Sitting, Supervision/safety Grooming Details (indicate cue type and reason): EOB without spinal support Upper Body Bathing: Set up, Supervision/ safety, Sitting Upper Body  Bathing Details (indicate cue type and reason): in recliner Lower Body Bathing: Maximal assistance Lower Body Bathing Details (indicate cue type and reason): Mod A +2 sit<>stand Upper Body Dressing : Moderate assistance, Sitting Upper Body Dressing Details (indicate cue type and reason): in recliner Lower Body Dressing: Maximal assistance Lower Body Dressing Details (indicate cue type and reason): Mod A +2 sit<>stand Toilet Transfer: Minimal assistance, Ambulation, Rolling walker (2 wheels), Contact guard assist Toilet Transfer Details (indicate cue type and reason): one handed A from bed to recliner with +2 for safety/A Toileting- Clothing Manipulation and Hygiene: Maximal assistance Toileting - Clothing Manipulation Details (indicate cue type and reason): Mod A +2 sit<>stand Functional mobility during ADLs: Minimal assistance, Rolling walker (2 wheels)   Cognition: Cognition Orientation Level: Disoriented to time, Oriented to person, Oriented to place, Oriented to situation Cognition Arousal: Alert Behavior During Therapy: [facility] for tasks assessed/performed   Physical Exam: Blood pressure (!) 110/52, pulse 83, temperature (!) 97.4 F (36.3 C), temperature source Oral, resp. rate 16, height 5' 5 (1.651 m), weight 70.9 kg, SpO2 99%. Physical Exam Constitutional:      General: He is not in acute distress.    Appearance: He is not ill-appearing.     Comments: Constant hiccups  HENT:     Head: Normocephalic and atraumatic.     Right Ear: External ear normal.     Left Ear: External ear normal.     Nose: Nose normal.     Mouth/Throat:     Mouth: Mucous membranes are moist.     Pharynx: Oropharynx is clear.  Eyes:     Conjunctiva/sclera: Conjunctivae normal.     Pupils: Pupils are equal, round, and reactive to light.  Cardiovascular:     Rate and Rhythm: Normal rate and regular rhythm.     Heart sounds: No murmur heard.    No gallop.  Pulmonary:     Effort: Pulmonary effort is  normal. No respiratory distress.     Breath sounds: No wheezing.     Comments: Intermittent productive cough Abdominal:     General: Bowel sounds are normal. There is no distension.     Tenderness: There is no abdominal tenderness.  Genitourinary:    Comments: +Foley. Urine still with gross blood and sediment in bag Musculoskeletal:     Cervical back: Normal range of motion.  Skin:    Findings: Bruising present.  Neurological:  Mental Status: He is alert.     Comments: Patient is alert sitting up in bed.  Makes eye contact with examiner.  He is hard of hearing.  He does recall his latest stay on inpatient rehab services.  Provides his name and age. Oriented to place, month/year.  He was limited as far as medical historian. CN exam non-focal. MMT: 4+/5 bilateral UE prox to distal. BLE 4-/5 HF, KE and 4/5 ADF/PF. Sensory exam normal for light touch and pain in all 4 limbs. No limb ataxia or cerebellar signs. No abnormal tone appreciated.    Psychiatric:        Mood and Affect: Mood normal.        Behavior: Behavior normal.       Lab Results Last 48 Hours        Results for orders placed or performed during the hospital encounter of 01/19/24 (from the past 48 hours)  Basic metabolic panel     Status: Abnormal    Collection Time: 01/28/24 10:35 AM  Result Value Ref Range    Sodium 139 135 - 145 mmol/L    Potassium 4.1 3.5 - 5.1 mmol/L    Chloride 104 98 - 111 mmol/L    CO2 28 22 - 32 mmol/L    Glucose, Bld 124 (H) 70 - 99 mg/dL      Comment: Glucose reference range applies only to samples taken after fasting for at least 8 hours.    BUN 24 (H) 8 - 23 mg/dL    Creatinine, Ser 9.09 0.61 - 1.24 mg/dL    Calcium  8.3 (L) 8.9 - 10.3 mg/dL    GFR, Estimated >39 >39 mL/min      Comment: (NOTE) Calculated using the CKD-EPI Creatinine Equation (2021)      Anion gap 7 5 - 15      Comment: Performed at [facility], [address] [place], [place] [address]      Imaging Results  (Last 48 hours)  No results found.           Blood pressure (!) 110/52, pulse 83, temperature (!) 97.4 F (36.3 C), temperature source Oral, resp. rate 16, height 5' 5 (1.651 m), weight 70.9 kg, SpO2 99%.   Medical Problem List and Plan: 1. Functional deficits secondary to septic shock secondary to acute pyelonephritis and obstructive uropathy with E. coli bacteremia.  Completing course of IV ceftriaxone              -patient may shower             -ELOS/Goals: 10-14 days, supervision goals with PT, and sup/min with OT 2.  Antithrombotics: -DVT/anticoagulation/right upper extremity SVT 12/3: Supportive care.  Mechanical: Antiembolism stockings, thigh (TED hose) Bilateral lower extremities             -antiplatelet therapy: Aspirin  81 mg daily and Plavix  75 mg daily 3. Pain Management:  Tylenol  as needed             -denied pain today 4. Mood/Behavior/Sleep: Melatonin 5 mg nightly as needed             -antipsychotic agents: N/A 5. Neuropsych/cognition: This patient is capable of making decisions on his own behalf. 6. Skin/Wound Care/WOC follow-up: Pressure injury to the sacrum 7. Fluids/Electrolytes/Nutrition: Routine IN and outs with follow-up chemistries             -decreased nutritional storage--encourage PO, appetite is not great but it's picking up per son 8.  Bilateral ureteral stones/left ureteral obstruction.  Status post bilateral ureteral stents 11/29 per Dr.Wrenn.  Foley tube in place             -has persistent hematuria and sediment 9.  Acute metabolic encephalopathy secondary to sepsis and acute infection.  Resolved with treatment. He is HOH 10.  New onset HFrEF.  Echocardiogram LVEF 35 to 40% with global hypokinesis.  Patient received IV diuresis and completed.  Elevated troponin felt to be related to demand ischemia trending down to 125 11.  Lactic acidosis.  Resolved with management of sepsis 12.  AKI.  Baseline creatinine 0.9.  Creatinine 1.75 with peak of 2.16.   Complicated by mild hydronephrosis from ureteral obstruction.  Creatinine improved AKI resolved.  Follow-up chemistries 13.  History of PAD.  Patient manage on DAPT for stent x 2 at SFA/popliteal artery.  Antiplatelets held on admission secondary to sepsis and thrombocytopenia and resumed 01/27/2024 14.  New onset atrial fibrillation.  Follow-up cardiology services.  Continue Toprol -XL 50 mg daily 15.  History of subdural hematoma and received CIR.  Cranial CT scan 11/28 showing no acute abnormality 16.  Hypertension.  Monitor with increased mobility 17.  Recent right clavicle fracture.  Advanced to weightbearing as tolerated. 18.  Hyperlipidemia.  Lipitor 19.  Constipation.  MiraLAX  daily, Senokot S1 tablet nightly             -had BM just before I came to see him today 20.  Intractable hiccups             -dc gabapentin              -trial of low-dose baclofen  5mg  bid. Observe for effect/tolerance   Toribio JINNY Pitch, PA-C 01/29/2024  I have personally performed a face to face diagnostic evaluation of this patient and formulated the key components of the plan.  Additionally, I have personally reviewed laboratory data, imaging studies, as well as relevant notes and concur with the physician assistant's documentation above.  The patient's status has not changed from the original H&P.  Any changes in documentation from the acute care chart have been noted above.  Arthea IVAR Gunther, MD, LEELLEN

## 2024-01-29 NOTE — Telephone Encounter (Signed)
 Will fax notes to Dr. Watt to see notes from preop APP Orren Fabry, [facility].

## 2024-01-29 NOTE — Plan of Care (Signed)

## 2024-01-29 NOTE — Discharge Instructions (Addendum)
 Inpatient Rehab Discharge Instructions  Tarrance Januszewski [place] Discharge date and time: No discharge date for patient encounter.   Activities/Precautions/ Functional Status: Activity: As tolerated Diet: Regular Wound Care: Routine skin checks Functional status:  ___ No restrictions     ___ Walk up steps independently ___ 24/7 supervision/assistance   ___ Walk up steps with assistance ___ Intermittent supervision/assistance  ___ Bathe/dress independently ___ Walk with walker     _x__ Bathe/dress with assistance ___ Walk Independently    ___ Shower independently ___ Walk with assistance    ___ Shower with assistance ___ No alcohol     ___ Return to work/school ________  Special Instructions: No driving smoking or alcohol   COMMUNITY REFERRALS UPON DISCHARGE:    Home Health:   PT      OT            RN (medication management and CHF hx)    SNA                 Agency:[facility]  Phone:[phone]  *Please expect follow-up within 2-3 business days for discharge to schedule your home visit. If you have not received follow-up, be sure to contact the site directly.*     My questions have been answered and I understand these instructions. I will adhere to these goals and the provided educational materials after my discharge from the hospital.  Patient/Caregiver Signature _______________________________ Date __________  Clinician Signature _______________________________________ Date __________  Please bring this form and your medication list with you to all your follow-up doctor's appointments.
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
## 2024-01-29 NOTE — Progress Notes (Signed)
 Inpatient Rehab Admissions Coordinator:  There is a bed available for pt in CIR today. Dr. Briana aware and in agreement. Pt, pt's wife, TOC and NSG made aware.   Tinnie Yvone Cohens, MS, CCC-SLP Admissions Coordinator [phone]
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
## 2024-01-29 NOTE — Consult Note (Addendum)
 WOC Nurse Consult Note: patient familiar to WOC team from inpatient admission; seen 12/2 with DTPI L buttock; now has evolved to Stage 3  Reason for Consult: sacral wound  Wound type: 1.  Stage 1 Pressure injury sacrum  erythema 2. Stage 3 Pressure Injury L buttock red moist  3.  Full thickness L lateral foot/heel/ankle scattered areas of dry scabbed tissue   Pressure Injury POA: Yes Measurement: see nursing flowsheet  Wound bed: red moist  Drainage (amount, consistency, odor) see nursing flowsheet  Periwound: patient with dark discoloration to buttocks felt to be related to chronic tissue damage; chronic pressure with friction  Dressing procedure/placement/frequency:  Cleanse L buttock wound with NS, apply silver hydrofiber Soila [phone]) cut to fit wound bed daily and secure with silicone foam.  Apply silicone foam to sacrum, lift daily to assess and change q3 days and prn soiling.  Wash L foot/heel with soap and water , dry and apply Xeroform gauze ([place] [phone]) to wound beds every other day, secure with silicone foam.    POC discussed with bedside nurse. WOC team will not follow. Reconsult if further needs arise.    Thank you,    Powell Bar MSN, RN-BC, [employer]

## 2024-01-29 NOTE — Discharge Summary (Signed)
 Physician Discharge Summary  Patient ID: Jerry [name] MRN: [id number] DOB/AGE: 1928/07/03 89 y.o.  Admit date: 01/29/2024 Discharge date: 02/08/2024  Discharge Diagnoses:  Septic shock secondary to acute pyelonephritis Obstructive uropathy E. coli bacteremia Staph epidermidis UTI Bilateral ureteral stones/left ureteral obstruction Acute metabolic encephalopathy secondary to sepsis New onset diastolic congestive heart failure New onset atrial fibrillation AKI History of PAD Hypertension History of subdural hematoma Recent right clavicle fracture Hyperlipidemia Constipation Intractable hiccups Macular degeneration Anemia of chronic disease Hearing loss Pressure injury of right/mid/left sacrum Right upper extremity SVT  Discharged Condition: Stable  Significant Diagnostic Studies: VAS US  LOWER EXTREMITY VENOUS (DVT) Result Date: 01/24/2024  Lower Venous DVT Study Patient Name:  Jerry [name]  Date of Exam:   01/24/2024 Medical Rec #: [id number]         Accession #:    [id number] Date of Birth: [birth year]         Patient Gender: M Patient Age:   89 years Exam Location:  [facility] Procedure:      VAS US  LOWER EXTREMITY VENOUS (DVT) Referring Phys: ABDULLAHI HUSSEIN --------------------------------------------------------------------------------  Indications: Swelling, and Edema.  Comparison Study: No prior exam. Performing Technologist: Edilia Elden Appl  Examination Guidelines: A complete evaluation includes B-mode imaging, spectral Doppler, color Doppler, and power Doppler as needed of all accessible portions of each vessel. Bilateral testing is considered an integral part of a complete examination. Limited examinations for reoccurring indications may be performed as noted. The reflux portion of the exam is performed with the patient in reverse Trendelenburg.  +---------+---------------+---------+-----------+----------+--------------+ RIGHT     CompressibilityPhasicitySpontaneityPropertiesThrombus Aging +---------+---------------+---------+-----------+----------+--------------+ CFV      Full           Yes      Yes                                 +---------+---------------+---------+-----------+----------+--------------+ SFJ      Full           Yes      Yes                                 +---------+---------------+---------+-----------+----------+--------------+ FV Prox  Full                                                        +---------+---------------+---------+-----------+----------+--------------+ FV Mid   Full                                                        +---------+---------------+---------+-----------+----------+--------------+ FV DistalFull                                                        +---------+---------------+---------+-----------+----------+--------------+ PFV      Full                                                        +---------+---------------+---------+-----------+----------+--------------+  POP      Full           Yes      Yes                                 +---------+---------------+---------+-----------+----------+--------------+ PTV      Full                                                        +---------+---------------+---------+-----------+----------+--------------+ PERO     Full                                                        +---------+---------------+---------+-----------+----------+--------------+   +----+---------------+---------+-----------+----------+--------------+ LEFTCompressibilityPhasicitySpontaneityPropertiesThrombus Aging +----+---------------+---------+-----------+----------+--------------+ CFV Full           Yes      Yes                                 +----+---------------+---------+-----------+----------+--------------+ SFJ Full           Yes      Yes                                  +----+---------------+---------+-----------+----------+--------------+     Summary: RIGHT: - There is no evidence of deep vein thrombosis in the lower extremity.  - No cystic structure found in the popliteal fossa.  LEFT: - No evidence of common femoral vein obstruction.   *See table(s) above for measurements and observations. Electronically signed by Debby Robertson on 01/24/2024 at 7:42:47 PM.    Final    VAS US  UPPER EXTREMITY VENOUS DUPLEX Result Date: 01/24/2024 UPPER VENOUS STUDY  Patient Name:  Jerry [name]  Date of Exam:   01/24/2024 Medical Rec #: [id number]         Accession #:    [id number] Date of Birth: 07-08-1928         Patient Gender: M Patient Age:   71 years Exam Location:  [facility] Procedure:      VAS US  UPPER EXTREMITY VENOUS DUPLEX Referring Phys: ABDULLAHI HUSSEIN --------------------------------------------------------------------------------  Indications: swollen, rule out DVT. Comparison Study: No prior exam. Performing Technologist: Edilia Elden Appl  Examination Guidelines: A complete evaluation includes B-mode imaging, spectral Doppler, color Doppler, and power Doppler as needed of all accessible portions of each vessel. Bilateral testing is considered an integral part of a complete examination. Limited examinations for reoccurring indications may be performed as noted.  Right Findings: +----------+------------+---------+-----------+----------+-------+ RIGHT     CompressiblePhasicitySpontaneousPropertiesSummary +----------+------------+---------+-----------+----------+-------+ IJV           Full       Yes       Yes                      +----------+------------+---------+-----------+----------+-------+ Subclavian    Full       Yes       Yes                      +----------+------------+---------+-----------+----------+-------+  Axillary      Full       Yes       Yes                       +----------+------------+---------+-----------+----------+-------+ Brachial      Full       Yes       Yes                      +----------+------------+---------+-----------+----------+-------+ Radial        Full                                          +----------+------------+---------+-----------+----------+-------+ Ulnar         Full                                          +----------+------------+---------+-----------+----------+-------+ Cephalic      None       No        No                       +----------+------------+---------+-----------+----------+-------+ Basilic       Full       Yes       Yes                      +----------+------------+---------+-----------+----------+-------+ Superficial vein thrombosis noted in the right cephalic vein from the shoulder to the antecubital fossa. All segments in the forearm are compressible.  Left Findings: +----------+------------+---------+-----------+----------+-------+ LEFT      CompressiblePhasicitySpontaneousPropertiesSummary +----------+------------+---------+-----------+----------+-------+ IJV           Full       Yes       Yes                      +----------+------------+---------+-----------+----------+-------+ Subclavian    Full       Yes       Yes                      +----------+------------+---------+-----------+----------+-------+  Summary:  Right: No evidence of deep vein thrombosis in the upper extremity. Findings consistent with acute superficial vein thrombosis involving the right cephalic vein.  Left: No evidence of thrombosis in the subclavian.  *See table(s) above for measurements and observations.  Diagnosing physician: Debby Robertson Electronically signed by Debby Robertson on 01/24/2024 at 4:08:52 PM.    Final    ECHOCARDIOGRAM COMPLETE Result Date: 01/20/2024    ECHOCARDIOGRAM REPORT   Patient Name:   Jerry [name] Date of Exam: 01/20/2024 Medical Rec #:  [id number]        Height:        65.0 in Accession #:    [id number]       Weight:       160.1 lb Date of Birth:  06-12-1928        BSA:          1.799 m Patient Age:    89 years         BP:           115/59 mmHg Patient Gender: M  HR:           73 bpm. Exam Location:  Inpatient Procedure: 2D Echo, Cardiac Doppler, Color Doppler and Intracardiac            Opacification Agent (Both Spectral and Color Flow Doppler were            utilized during procedure). Indications:    Shock R57.9  History:        Patient has no prior history of Echocardiogram examinations.                 Signs/Symptoms:Shortness of Breath and Dyspnea; Risk                 Factors:Dyslipidemia, Hypertension and Pre-diabetes.  Sonographer:    Koleen Popper RDCS Referring Phys: [id number] OMAR M ALBUSTAMI IMPRESSIONS  1. No left ventricular thrombus is seen (Definity  contrast was used). Left ventricular ejection fraction, by estimation, is 35 to 40%. The left ventricle has moderately decreased function. The left ventricle demonstrates global hypokinesis. Left ventricular diastolic parameters are consistent with Grade I diastolic dysfunction (impaired relaxation).  2. Right ventricular systolic function is moderately reduced. The right ventricular size is not well visualized. Tricuspid regurgitation signal is inadequate for assessing PA pressure.  3. Left atrial size was mildly dilated.  4. The mitral valve is degenerative. No evidence of mitral valve regurgitation. No evidence of mitral stenosis. Moderate mitral annular calcification.  5. There appears to be low flow low gradient mild aortic stenosis. The aortic valve is tricuspid. There is moderate calcification of the aortic valve. There is moderate thickening of the aortic valve. Aortic valve regurgitation is trivial. Mild aortic valve stenosis.  6. The inferior vena cava is dilated in size with <50% respiratory variability, suggesting right atrial pressure of 15 mmHg. FINDINGS  Left Ventricle: No left  ventricular thrombus is seen (Definity  contrast was used). Left ventricular ejection fraction, by estimation, is 35 to 40%. The left ventricle has moderately decreased function. The left ventricle demonstrates global hypokinesis.  Definity  contrast agent was given IV to delineate the left ventricular endocardial borders. The left ventricular internal cavity size was normal in size. There is borderline left ventricular hypertrophy of the basal-septal segment. Left ventricular diastolic parameters are consistent with Grade I diastolic dysfunction (impaired relaxation). Normal left ventricular filling pressure. Right Ventricle: The right ventricular size is not well visualized. Right vetricular wall thickness was not well visualized. Right ventricular systolic function is moderately reduced. Tricuspid regurgitation signal is inadequate for assessing PA pressure. Left Atrium: Left atrial size was mildly dilated. Right Atrium: Right atrial size was normal in size. Pericardium: There is no evidence of pericardial effusion. Mitral Valve: The mitral valve is degenerative in appearance. There is mild thickening of the mitral valve leaflet(s). Moderate mitral annular calcification. No evidence of mitral valve regurgitation. No evidence of mitral valve stenosis. Tricuspid Valve: The tricuspid valve is normal in structure. Tricuspid valve regurgitation is mild. Aortic Valve: There appears to be low flow low gradient mild aortic stenosis. The aortic valve is tricuspid. There is moderate calcification of the aortic valve. There is moderate thickening of the aortic valve. Aortic valve regurgitation is trivial. Mild aortic stenosis is present. Aortic valve mean gradient measures 4.5 mmHg. Aortic valve peak gradient measures 8.5 mmHg. Aortic valve area, by VTI measures 1.36 cm. Pulmonic Valve: The pulmonic valve was not well visualized. Aorta: The aortic root and ascending aorta are structurally normal, with no evidence of  dilitation. Venous: The inferior vena  cava is dilated in size with less than 50% respiratory variability, suggesting right atrial pressure of 15 mmHg. IAS/Shunts: No atrial level shunt detected by color flow Doppler.  LEFT VENTRICLE PLAX 2D LVIDd:         4.30 cm     Diastology LVIDs:         3.00 cm     LV e' medial:    5.66 cm/s LV PW:         1.00 cm     LV E/e' medial:  9.3 LV IVS:        1.30 cm     LV e' lateral:   4.68 cm/s LVOT diam:     1.90 cm     LV E/e' lateral: 11.3 LV SV:         37 LV SV Index:   21 LVOT Area:     2.84 cm  LV Volumes (MOD) LV vol d, MOD A4C: 74.5 ml LV vol s, MOD A4C: 45.0 ml LV SV MOD A4C:     29.5 ml RIGHT VENTRICLE            IVC RV S prime:     6.64 cm/s  IVC diam: 2.50 cm TAPSE (M-mode): 1.7 cm LEFT ATRIUM             Index        RIGHT ATRIUM           Index LA diam:        3.80 cm 2.11 cm/m   RA Area:     13.30 cm LA Vol (A2C):   36.9 ml 20.51 ml/m  RA Volume:   29.80 ml  16.56 ml/m LA Vol (A4C):   29.0 ml 16.12 ml/m LA Biplane Vol: 33.0 ml 18.34 ml/m  AORTIC VALVE AV Area (Vmax):    1.48 cm AV Area (Vmean):   1.39 cm AV Area (VTI):     1.36 cm AV Vmax:           145.79 cm/s AV Vmean:          100.740 cm/s AV VTI:            0.273 m AV Peak Grad:      8.5 mmHg AV Mean Grad:      4.5 mmHg LVOT Vmax:         76.00 cm/s LVOT Vmean:        49.300 cm/s LVOT VTI:          0.131 m LVOT/AV VTI ratio: 0.48  AORTA Ao Root diam: 3.20 cm Ao Asc diam:  3.20 cm MITRAL VALVE MV Area (PHT): 3.12 cm    SHUNTS MV Decel Time: 243 msec    Systemic VTI:  0.13 m MV E velocity: 52.90 cm/s  Systemic Diam: 1.90 cm MV A velocity: 98.00 cm/s MV E/A ratio:  0.54 Mihai Croitoru MD Electronically signed by Jerel Balding MD Signature Date/Time: 01/20/2024/1:19:29 PM    Final    DG Chest Port 1 View Result Date: 01/20/2024 CLINICAL DATA:  Respiratory distress. EXAM: PORTABLE CHEST 1 VIEW COMPARISON:  Prior today, and chest CT on 01/19/2024 FINDINGS: The heart size is within normal limits.  Right jugular central venous catheter remains in appropriate position. Surgical staples again seen in the right lung base. Bibasilar scarring shows no significant change. No evidence of superimposed pulmonary consolidation or pleural effusion. IMPRESSION: Bibasilar scarring. No acute findings. Electronically Signed   By: Norleen DELENA Graham CHRISTELLA.D.  On: 01/20/2024 11:50   DG CHEST PORT 1 VIEW Result Date: 01/20/2024 EXAM: 1 VIEW(S) XRAY OF THE CHEST 01/20/2024 02:56:50 AM COMPARISON: Portable chest yesterday at 7:44 pm, chest CT yesterday at 8:13 pm. CLINICAL HISTORY: [id number] Encounter for central line placement [id number] Encounter for central line placement [id number] FINDINGS: LINES, TUBES AND DEVICES: There is a new right IJ (internal jugular) central line with the tip at or slightly below the superior cavoatrial junction. LUNGS AND PLEURA: There are postsurgical changes and scarring in the right lung base, increased coarse linear atelectatic markings in the left lower lobe distribution. No pleural effusion. No pneumothorax. HEART AND MEDIASTINUM: Stable cardiomegaly. No vascular congestion is seen. There is tortuosity and atherosclerosis of the aorta with stable mediastinum. BONES AND SOFT TISSUES: No acute osseous abnormality. IMPRESSION: 1. Right IJ central line with the tip at or slightly below the superior cavoatrial junction. No pneumothorax. 2. Lungs are clear of infiltrates. 3. Stable cardiomegaly without vascular congestion. Electronically signed by: Francis Quam MD 01/20/2024 03:36 AM EST RP Workstation: [id number]   DG C-Arm 1-60 Min-No Report Result Date: 01/20/2024 Fluoroscopy was utilized by the requesting physician.  No radiographic interpretation.   CT Cervical Spine Wo Contrast Result Date: 01/19/2024 EXAM: CT CERVICAL SPINE WITHOUT CONTRAST 01/19/2024 09:42:30 PM TECHNIQUE: CT of the cervical spine was performed without the administration of intravenous contrast. Multiplanar reformatted images are  provided for review. Automated exposure control, iterative reconstruction, and/or weight based adjustment of the mA/kV was utilized to reduce the radiation dose to as low as reasonably achievable. COMPARISON: Comparison with CT 11/30/2023. CLINICAL HISTORY: level 2 on thinners FINDINGS: CERVICAL SPINE: BONES AND ALIGNMENT: Chronic anterolisthesis of C3. No acute fracture or traumatic malalignment. DEGENERATIVE CHANGES: Multilevel spondylosis, disc space height loss, and degenerative endplate changes which is greatest at C4-C5. Small multilevel facet arthropathy. No severe spinal canal narrowing. SOFT TISSUES: No prevertebral soft tissue swelling. IMPRESSION: 1. No acute abnormality of the cervical spine. Electronically signed by: Norman Gatlin MD 01/19/2024 10:10 PM EST RP Workstation: [id number]   CT Head Wo Contrast Result Date: 01/19/2024 EXAM: CT HEAD WITHOUT CONTRAST 01/19/2024 09:42:30 PM TECHNIQUE: CT of the head was performed without the administration of intravenous contrast. Automated exposure control, iterative reconstruction, and/or weight based adjustment of the mA/kV was utilized to reduce the radiation dose to as low as reasonably achievable. COMPARISON: CT 11/30/2023 CLINICAL HISTORY: fall, delirium FINDINGS: BRAIN AND VENTRICLES: No acute hemorrhage. No evidence of acute infarct. No hydrocephalus. No extra-axial collection. No mass effect or midline shift. Chronic microvascular ischemia and generalized atrophy. ORBITS: No acute abnormality. SINUSES: No acute abnormality. SOFT TISSUES AND SKULL: No acute soft tissue abnormality. No skull fracture. IMPRESSION: 1. No acute intracranial abnormality. Electronically signed by: Norman Gatlin MD 01/19/2024 10:06 PM EST RP Workstation: [id number]   CT CHEST ABDOMEN PELVIS WO CONTRAST Result Date: 01/19/2024 CLINICAL DATA:  Fall EXAM: CT CHEST, ABDOMEN AND PELVIS WITHOUT CONTRAST TECHNIQUE: Multidetector CT imaging of the chest, abdomen and pelvis  was performed following the standard protocol without IV contrast. RADIATION DOSE REDUCTION: This exam was performed according to the departmental dose-optimization program which includes automated exposure control, adjustment of the mA and/or kV according to patient size and/or use of iterative reconstruction technique. COMPARISON:  CT renal stone 12/13/2023. CT chest abdomen and pelvis 11/30/2023. FINDINGS: CT CHEST FINDINGS Cardiovascular: No significant vascular findings. Normal heart size. No pericardial effusion. There are atherosclerotic calcifications of the aorta and coronary arteries. Mediastinum/Nodes: No enlarged mediastinal, hilar, or axillary  lymph nodes. Thyroid gland, trachea, and esophagus demonstrate no significant findings. There is a small hiatal hernia. Lungs/Pleura: There are atelectatic changes in the bilateral lower lobes. There some calcified pleural plaques in the inferior right lower hemithorax. There is scarring in both lung apices. There is no pleural effusion or pneumothorax identified. 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 Bones are diffusely osteopenic. There are healing/healed posterior right fourth through tenth rib fractures. There also multiple healing/healed anterior right rib fractures. There is questionable acute nondisplaced anterior right second rib fracture there are healing posterior left a, ninth and eleventh rib fractures. There is age indeterminate posterior left tenth rib fracture. These all appear new from 11/30/2023. CT ABDOMEN PELVIS FINDINGS Hepatobiliary: Gallstones are present. No focal liver lesion. No biliary ductal dilatation. Pancreas: There is diffuse fatty infiltration of the pancreas. Spleen: Normal in size without focal abnormality. Adrenals/Urinary Tract: There is a 2 mm calculus in the mid left ureter with mild left-sided hydronephrosis and left perinephric stranding and fluid. The adrenal  glands, right kidney and bladder are within normal limits. Stomach/Bowel: Stomach is within normal limits. Appendix appears normal. No evidence of bowel wall thickening, distention, or inflammatory changes. There is sigmoid colon diverticulosis. There is a small hiatal hernia. Vascular/Lymphatic: There severe atherosclerotic calcifications of the aorta. Abdominal aortic aneurysm measures 4.0 cm, unchanged. 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 No enlarged lymph nodes are identified. Reproductive: Prostate gland is enlarged. Other: No abdominal wall hernia or abnormality. No abdominopelvic ascites. Musculoskeletal: The bones are diffusely osteopenic. No acute fractures are identified. 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 IMPRESSION: 1. 2 mm calculus in the mid left ureter with mild obstructive uropathy. 2. Multiple healing/healed bilateral rib fractures. There is questionable acute nondisplaced anterior right second rib fracture. 3. Age-indeterminate posterior left tenth rib fracture. 4. Cholelithiasis. 5. Stable abdominal aortic aneurysm measuring 4.0 cm. 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 6. Aortic atherosclerosis. Aortic Atherosclerosis (ICD10-I70.0). Electronically Signed   By: Greig Pique M.D.   On: 01/19/2024 20:31   DG Chest Port 1 View Result Date: 01/19/2024 EXAM: 1 VIEW(S) XRAY OF THE CHEST 01/19/2024 07:47:00 PM COMPARISON: 12/03/2023 CLINICAL HISTORY: Questionable sepsis - evaluate for abnormality FINDINGS: LUNGS AND PLEURA: Surgical suture at right lung base. Chronic interstitial coarsening. Chronic scarring at right lung base. No pleural effusion. No pneumothorax. HEART AND MEDIASTINUM: Aortic atherosclerosis is present. The cardiac silhouette is unremarkable. BONES AND SOFT TISSUES: No acute osseous  abnormality. IMPRESSION: 1. No acute cardiopulmonary process identified. 2. 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 Electronically signed by: Norman Gatlin MD 01/19/2024 07:58 PM EST RP Workstation: [id number]    Labs:  Basic Metabolic Panel: Recent Labs  Lab 02/02/24 0527 02/05/24 0512 02/06/24 0516 02/07/24 0443  NA 139 136 138 136  K 5.0 4.6 4.3 4.3  CL 108 106 107 106  CO2 23 24 21* 23  GLUCOSE 110* 96 100* 98  BUN 22 31* 29* 30*  CREATININE 1.11 1.38* 1.32* 1.21  CALCIUM  9.0 8.8* 8.9 8.9    CBC: Recent Labs  Lab 02/02/24 0527 02/05/24 0512  WBC 8.5 7.3  NEUTROABS 6.0  --   HGB 10.7* 11.4*  HCT 33.1* 36.0*  MCV 97.1 96.5  PLT 490*  342    CBG: Recent Labs  Lab 02/04/24 2101 02/06/24 1136  GLUCAP 133* 106*    Brief HPI:   Jerry [name] is a 89 y.o. 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 Selinda Gu, traumatic subdural hematoma after a fall as well as nondisplaced right clavicle fracture received CIR 12/05/2023 - 12/19/2023 was discharged to home with family quiring supervision for bed mobility and ambulating household distances with a rollator with arrangements for home health therapy.  Per chart review patient lives with spouse.  Two-level home bed and bath main level 3 steps to entry.  Presented 01/19/2024 after being found down after hearing a loud noise in the bathroom patient noted to have altered mental status.  In the ED patient was tachycardic as well as hypotensive with fever of 103 cranial CT scan showed no acute intracranial abnormality.  No skull fracture.  CT cervical spine negative.  CT of the chest abdomen and pelvis showed 2 mm calculus in the mid left ureter with mild obstructive uropathy.   Multiple healing healed bilateral rib fractures.  Age-indeterminate posterior left 10th rib fracture.  Stable abdominal aortic aneurysm measuring 4 cm.  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  Admission chemistries unremarkable except creatinine 1.75 WBC 14,900 platelets 103,000 blood cultures E. coli BNP 292.3 lactic acid 5.1-7.0 troponin elevated 147-125.  Patient was placed on broad-spectrum antibiotics with vancomycin  and Zosyn  for sepsis as well as receiving 2 L lactated ringer  boluses DuoNeb and Solu-Medrol .  Urology Dr. Norleen Seltzer consulted in regards to bilateral ureteral stones with left obstruction undergoing cystoscopy and bilateral retrograde pyelography and bilateral ureteral stents 01/20/2024.  Hospital course septic shock secondary to acute pyelonephritis evidence of E. coli bacteremia.  Patient initially required ICU admission started on dobutamine  vasopressin  and norepinephrine  and weaned slowly from vasopressors.  His empiric vancomycin  and Zosyn  transition to ceftriaxone  with leukocytosis initially peaked at 43,600.  Foley tube remained in place mild hematuria monitor with hemoglobin 11.5.  New onset diastolic congestive heart failure question etiology felt troponin positive related to demand ischemia/sepsis.  Transthoracic echocardiogram significant for LVEF of 35 to 40% with global hypokinesis.  Patient did receive diuresis.  Lactic acidosis resolved with management of sepsis.  AKI complicated by mild hydronephrosis with ureteral obstruction improved with latest creatinine 0.89.  Incidental findings of right upper extremity SVT diagnosed 12/3 with supportive care.  Patient developed new onset atrial fibrillation 01/25/2024 with cardiology service follow-up placed on Toprol -XL titrated as needed cardiac rate controlled.  No anticoagulation at this time contraindicated due to recent subdural bleed.  In regards to patient's history of PAD manage on DAPT for  stent x 2 SFA/popliteal artery antiplatelets held initially secondary to sepsis and thrombocytopenia later resumed 01/27/2024 platelet count improved to 175,000.  Pressure injury of right mid/left sacrum wound care as directed.  Therapy evaluations completed due to patient's decreased functional mobility was admitted for a comprehensive rehab program.   Hospital Course: Jerry [name] was admitted to rehab 01/29/2024 for inpatient therapies to consist of PT, ST and OT at least three hours five days a week. Past admission physiatrist, therapy team and rehab RN have worked together to provide customized collaborative inpatient rehab.  Pertaining to patient's septic shock secondary to acute pyelonephritis and obstructive uropathy E. coli bacteremia.  Completing  course of ceftriaxone .  Bilateral ureteral stones with left ureteral obstruction status post bilateral ureteral stents 11/29 per Dr. Watt. Foley tube initially in place monitor for any persistent hematuria and sediment.  Latest urine culture staff epidermidis 40,000 colony completing course of doxycycline .  Acute metabolic encephalopathy cranial CT scan negative resolved with treatment.  New onset diastolic congestive heart failure echocardiogram with LVEF of 35 to 40% global hypokinesis patient did receive IV diuresis and monitored for any signs of fluid overload.  AKI baseline creatinine 0.9 creatinine peaked to 2.16 complicated by mild hydronephrosis ureteral obstruction creatinine improved AKI resolved monitoring of chemistries.  History of PAD managed with DAPT resumed 01/27/2024 follow-up vascular surgery.  New onset atrial fibrillation Toprol  XL 50 mg daily as well as tapering amiodarone  down to 200 mg daily with cardiac rate controlled no current plan for anticoagulation due to history of subdural hematoma and patient would follow-up cardiology services.  Recent right clavicle fracture weightbearing as tolerated.  Lipitor ongoing for hyperlipidemia.   Bouts of constipation resolved with laxative assistance.  Intractable hiccups gabapentin  discontinued placed on baclofen  and observed.   Blood pressures were monitored on TID basis and remained controlled and monitored     Rehab course: During patient's stay in rehab weekly team conferences were held to monitor patient's progress, set goals and discuss barriers to discharge. At admission, patient required minimal assist contact-guard 150 feet rolling walker minimal assist step pivot transfers  He/She  has had improvement in activity tolerance, balance, postural control as well as ability to compensate for deficits. He/She has had improvement in functional use RUE/LUE  and RLE/LLE as well as improvement in awareness.  Working with energy conservation techniques.  Patient comes to edge of bed with supervision using bed rail.  Stood with contact-guard using rolling walker transferred to the wheelchair via stand pivot.  Completed 100-150 x 2 feet functional mobility rolling walker to challenge endurance requiring contact-guard supervision.  Completed blocked practice sit to stand 3 x 10 repetitions with no upper extremity support.  Required contact-guard and cueing for posture and erect trunk.  During ADLs supine to sit edge of bed supervision.  Ambulate walk-in shower transfer with close supervision contact-guard.  Patient dresses lower body with intermediate contact guard.  Set up assist provided for upper body dressing.  Full family teaching completed plan discharge to home       Disposition:  Discharge disposition: 06-Home-Health Care Svc        Diet: Regular consistency 2 g sodium 1500 mL liter fluid restriction  Special Instructions: No driving smoking or alcohol  Follow-up urology service in regards to bilateral ureteral stents  Medications at discharge. 1.  Amiodarone  200 mg twice daily x 4 days then 200 mg daily beginning 02/12/2024 2.  Aspirin  81 mg p.o. daily 3.  Lipitor 40  mg p.o. daily 4.  Baclofen  5 mg p.o. twice daily as needed 5.  Plavix  75 mg p.o. daily 6.  Colace 200 mg twice daily as needed constipation 7.  Protonix  40 mg daily 8.  Melatonin 5 mg p.o. nightly as needed sleep 9.  Toprol -XL 50 mg p.o. daily 10.  MiraLAX  daily as needed hold for loose stools 11.  Senokot S1 tablet nightly 12.  Multivitamin daily 13.  Gerhardt Butt cream twice daily  30-35 minutes were spent completing discharge summary and discharge planning     Follow-up Information     Emeline Joesph BROCKS, DO Follow up.   Specialty: Physical Medicine and Rehabilitation  Why: No formal follow-up needed Contact information: [address] [place] [address] [phone]         Shlomo Wilbert SAUNDERS, MD Follow up.   Specialty: Cardiology Why: Call for appointment Contact information: [address] [place] [address] [phone]         [name] ORN, MD Follow up.   Specialty: Internal Medicine Why: Call for appointment Contact information: [address] [facility] [place] [address] [phone]         Watt Rush, MD Follow up.   Specialty: Urology Why: Call for appointment for plan of care on ureteral stents Contact information: [address] [place] [address] [phone]                 Signed: Toribio PARAS Daylah Sayavong 02/08/2024, 4:46 AM

## 2024-01-29 NOTE — Telephone Encounter (Signed)
   Pre-operative Risk Assessment    Patient Name: Jerry [name]  DOB: 13-Aug-1928 MRN: [id number]   Date of last office visit: Date of next office visit: TBD    Request for Surgical Clearance    Procedure:  bilateral ureteroscopy with stone removal   Date of Surgery:  Clearance 03/12/24                                Surgeon:Dr.  Norleen Seltzer Surgeon's Group or Practice Name:  [facility]  Phone number:  [phone] Fax [phone]:  [phone]   Type of Clearance Requested:   - Medical  - Pharmacy:  Hold Clopidogrel  (Plavix ) defer to cardiology    Type of Anesthesia:  General    Additional requests/questions:    Signed, Larraine Salt   01/29/2024, 1:15 PM

## 2024-01-29 NOTE — Discharge Summary (Signed)
 Physician Discharge Summary   Patient: Jerry [name] MRN: [id number] DOB: 10-Jan-1929  Admit date:     01/19/2024  Discharge date: 01/29/24  Discharge Physician: Elgin Lam, MD   PCP: [name] ORN, MD   Recommendations at discharge:  PCP visit for hospital follow-up Cardiology visit for hospital follow-up Remove foley / voiding trial while at inpatient rehab  Discharge Diagnoses: Principal Problem:   Septic shock [facility]) Active Problems:   Primary hypertension   Malnutrition of moderate degree   Acute pyelonephritis   Atrial fibrillation with RVR (HCC)   RBBB   Acute on chronic combined systolic and diastolic CHF (congestive heart failure) (HCC)   DCM (dilated cardiomyopathy) (HCC)   Elevated troponin   E coli bacteremia  Resolved Problems:   * No resolved hospital problems. [facility] Course: Jerry [name] is a 89 y.o. male with a history of AAA, popliteal aneurysm s/p stent and DAPT, hyperlipidemia, hypertension, subdural hematoma, hyperlipidemia.  Patient presented secondary to a fall and delirium, found to have evidence of septic shock secondary to obstructive uropathy, in addition to pyelonephritis and bacteremia. Patient required ICU admission and intubation. Urology consulted and performed urgent bilateral ureteral stent placements. Symptoms improving with treatment. Hospitalization further complicated by development of atrial fibrillation with RVR. Cardiology consulted and medications adjusted for better control. Patient converted to atrial flutter and finally to sinus rhythm prior to discharge.  Assessment and Plan:  Septic shock Present on admission. Secondary to acute pyelonephritis and obstructive uropathy with evidence of E. Coli bacteremia. Patient started empirically on Vancomycin  and Zosyn  initially and transitioned to Ceftriaxone  IV. Patient required ICU admission and was started on dobutamine , vasopressin , phenylephrine  and norepinephrine .  Vasopressors weaned off with treatment of infection and ureteral obstruction.   Acute pyelonephritis E. Coli bacteremia Secondary to left ureter obstruction. Urine culture significant for E. Coli. Blood culture also with E. Coli. Empiric Vancomycin  and Zosyn  transitioned to Ceftriaxone  for management and completed 7 days of treatment. Continued improvement in WBC after antibiotic cessation.   Bilateral ureteral stones Left ureter obstruction Urology consulted and performed cystoscopy with insertion of bilateral ureteral stents on 11/29. Per urology, patient can have foley catheter removed. Initially planned for a voiding trial once Lasix  IV discontinued, however patient was accepted to acute inpatient rehabilitation on the same day as discontinuing Lasix  IV. Recommend voiding trial once he is at acute inpatient rehabilitation.   Acute metabolic encephalopathy Secondary to sepsis and acute infection. Resolved with treatment.   New onset HFrEF Unclear etiology, but patient does have a history of PAD, so ischemia is a possible etiology. Could also be related to sepsis.  Troponin elevated at 147 with downtrend to 125. Transthoracic Echocardiogram significant for an LVEF of 35-40% with global hypokinesis. Patient managed, shortly, with Lasix . Cardiology consulted and have restarted Lasix  IV diuresis with good urine output. Recommendation to Toprol  XL on discharge. Outpatient follow-up. Low sodium and fluid restricted diet.   Atrial fibrillation/flutter with RVR Noted to have an irregular rhythm on exam. EKG obtained and confirms atrial fibrillation. Complicated by low LVEF. Cardiology consulted. Not a candidate for anticoagulation per cardiology secondary to fall/SDH history. Patient transitioned to atrial flutter, noted on telemetry 12/7. Patient appears to have converted to sinus rhythm on 12/8. Cardiology recommendation to continue Toprol  XL 50 mg daily and have started a limited course of amiodarone   with taper. Patient to follow-up outpatient with cardiology.   Intractable hiccups Start Gabapentin  100 mg TID and titrate up for  effect. Discontinue once hiccups are resolved.   Acute respiratory failure with hypoxia Patient with respiratory distress and hypoxia secondary to COPD vs heart failure prior to admission to ICU. Resolved.   Lactic acidosis Lactic acid of 4.1 on admission. Resolved with management of sepsis.   AKI Baseline creatinine of around 0.9. Creatinine of 1.75 with peak of 2.16. Complicated by mild hydronephrosis from ureteral obstruction. Creatinine improved. AKI resolved.    Elevated TSH Elevated free T4 with low T3. Discordant. Possibly related to recovery from patient's critical illness. Will recommend repeat thyroid studies in a few weeks.   Right upper extremity SVT Diagnosed on 12/3. -Supportive care   PAD Patient managed on DAPT for stent x2 at SFA/popliteal artery. Antiplatelets held on admission secondary to sepsis and thrombocytopenia. Patient with atrial fibrillation, however per cardiology, unlikely he will be started on anticoagulation. Continue aspirin  and Plavix .   Thrombocytopenia Likely secondary to acute infection. Platelets down to a low of 30,000. Improved to 329,000.   Cough Likely related to recent intubation. Clear production. Improving slowly.   History of subdural hematoma Noted. No evidence of hematoma on CT head obtained this admission.   AAA Noted. Continue Lipitor.   Cholelithiasis Noted incidentally on CT imaging. Asymptomatic.   Bilateral rib fractures Noted. No evidence for acute fracture.   Aortic atherosclerosis Noted.   Moderate malnutrition Dietitian recommendations (12/2): Liberalize diet to regular in the setting of advanced age Ensure Plus High Protein po BID, each supplement provides 350 kcal and 20 grams of protein. Magic cup TID with meals, each supplement provides 290 kcal and 9 grams of protein Continue  MVI with minerals daily   Constipation Improved with bowel regimen.   Pressure injury Right/mid/left sacrum. Unclear if present on admission.   Consultants:  PCCM Urology Cardiology   Procedures:  Transthoracic Echocardiogram Cystoscopy with insertion of bilateral ureteral stents  Disposition: Rehabilitation facility Diet recommendation: Cardiac diet   DISCHARGE MEDICATION: Allergies as of 01/29/2024       Reactions   Ivp Dye [iodinated Contrast Media]    Simvastatin    REACTION: joint pains   Soap Itching   fragrant soaps        Medication List     STOP taking these medications    diclofenac  Sodium 1 % Gel Commonly known as: VOLTAREN    diltiazem  240 MG 24 hr capsule Commonly known as: CARDIZEM  CD   lidocaine  5 % Commonly known as: LIDODERM    oxyCODONE  5 MG immediate release tablet Commonly known as: Oxy IR/ROXICODONE    topiramate  25 MG tablet Commonly known as: TOPAMAX        TAKE these medications    Acetaminophen  Extra Strength 500 MG Tabs Take 2 tablets (1,000 mg total) by mouth every 6 (six) hours as needed.   amiodarone  200 MG tablet Commonly known as: PACERONE  Take 1 tablet (200 mg total) by mouth 2 (two) times daily for 14 days, THEN 1 tablet (200 mg total) daily for 14 days. Start taking on: January 29, 2024   aspirin  EC 81 MG tablet Take 1 tablet (81 mg total) by mouth daily. Swallow whole.   atorvastatin  40 MG tablet Commonly known as: LIPITOR Take 1 tablet (40 mg total) by mouth daily.   clopidogrel  75 MG tablet Commonly known as: PLAVIX  Take 1 tablet (75 mg total) by mouth daily.   Ensure Max Protein Liqd Take 330 mLs (11 oz total) by mouth daily. Start taking on: January 30, 2024   gabapentin  100 MG capsule  Commonly known as: NEURONTIN  Take 1 capsule (100 mg total) by mouth 3 (three) times daily.   metoprolol  succinate 50 MG 24 hr tablet Commonly known as: TOPROL -XL Take 1 tablet (50 mg total) by mouth daily. Take  with or immediately following a meal. Start taking on: January 30, 2024   MULTIVITAMIN PO Take 1 tablet by mouth daily.   polyethylene glycol 17 g packet Commonly known as: MIRALAX  / GLYCOLAX  Take 17 g by mouth daily as needed for moderate constipation.   PRESERVISION AREDS PO Take 1 capsule by mouth 2 (two) times daily.   senna-docusate 8.6-50 MG tablet Commonly known as: Senokot-S Take 1 tablet by mouth at bedtime.   tamsulosin  0.4 MG Caps capsule Commonly known as: FLOMAX  Take 1 capsule (0.4 mg total) by mouth daily after supper.   traZODone  50 MG tablet Commonly known as: DESYREL  Take 1 tablet (50 mg total) by mouth at bedtime. What changed: how much to take        Follow-up Information     [name] ORN, MD. Schedule an appointment as soon as possible for a visit in 1 week(s).   Specialty: Internal Medicine Why: For hospital follow-up Contact information: [address] [facility] [place] [address] [phone]         Shlomo Wilbert SAUNDERS, MD. Schedule an appointment as soon as possible for a visit.   Specialty: Cardiology Why: For hospital follow-up Contact information: [address] [place] [address] [phone]                Discharge Exam: BP 99/65   Pulse 87   Temp 98 F (36.7 C)   Resp 16   Ht 5' 5 (1.651 m)   Wt 70.8 kg   SpO2 96%   BMI 25.97 kg/m   General exam: Appears calm and comfortable. Respiratory system: Clear to auscultation. Respiratory effort normal. Cardiovascular system: S1 & S2 heard, Normal rate with regular rhythm. Gastrointestinal system: Abdomen is nondistended, soft and nontender. Normal bowel sounds heard. Central nervous system: Alert and oriented. No focal neurological deficits. Musculoskeletal: No edema. No calf tenderness Psychiatry: Judgement and insight appear normal. Mood & affect appropriate.   Condition at discharge: stable  The results of significant  diagnostics from this hospitalization (including imaging, microbiology, ancillary and laboratory) are listed below for reference.   Imaging Studies: VAS US  LOWER EXTREMITY VENOUS (DVT) Result Date: 01/24/2024  Lower Venous DVT Study Patient Name:  AYEDEN GLADMAN Dade  Date of Exam:   01/24/2024 Medical Rec #: [id number]         Accession #:    [id number] Date of Birth: [birth year]         Patient Gender: M Patient Age:   89 years Exam Location:  [facility] Procedure:      VAS US  LOWER EXTREMITY VENOUS (DVT) Referring Phys: ABDULLAHI HUSSEIN --------------------------------------------------------------------------------  Indications: Swelling, and Edema.  Comparison Study: No prior exam. Performing Technologist: Edilia Elden Appl  Examination Guidelines: A complete evaluation includes B-mode imaging, spectral Doppler, color Doppler, and power Doppler as needed of all accessible portions of each vessel. Bilateral testing is considered an integral part of a complete examination. Limited examinations for reoccurring indications may be performed as noted. The reflux portion of the exam is performed with the patient in reverse Trendelenburg.  +---------+---------------+---------+-----------+----------+--------------+ RIGHT    CompressibilityPhasicitySpontaneityPropertiesThrombus Aging +---------+---------------+---------+-----------+----------+--------------+ CFV      Full  Yes      Yes                                 +---------+---------------+---------+-----------+----------+--------------+ SFJ      Full           Yes      Yes                                 +---------+---------------+---------+-----------+----------+--------------+ FV Prox  Full                                                        +---------+---------------+---------+-----------+----------+--------------+ FV Mid   Full                                                         +---------+---------------+---------+-----------+----------+--------------+ FV DistalFull                                                        +---------+---------------+---------+-----------+----------+--------------+ PFV      Full                                                        +---------+---------------+---------+-----------+----------+--------------+ POP      Full           Yes      Yes                                 +---------+---------------+---------+-----------+----------+--------------+ PTV      Full                                                        +---------+---------------+---------+-----------+----------+--------------+ PERO     Full                                                        +---------+---------------+---------+-----------+----------+--------------+   +----+---------------+---------+-----------+----------+--------------+ LEFTCompressibilityPhasicitySpontaneityPropertiesThrombus Aging +----+---------------+---------+-----------+----------+--------------+ CFV Full           Yes      Yes                                 +----+---------------+---------+-----------+----------+--------------+ SFJ Full           Yes      Yes                                 +----+---------------+---------+-----------+----------+--------------+  Summary: RIGHT: - There is no evidence of deep vein thrombosis in the lower extremity.  - No cystic structure found in the popliteal fossa.  LEFT: - No evidence of common femoral vein obstruction.   *See table(s) above for measurements and observations. Electronically signed by Debby Robertson on 01/24/2024 at 7:42:47 PM.    Final    VAS US  UPPER EXTREMITY VENOUS DUPLEX Result Date: 01/24/2024 UPPER VENOUS STUDY  Patient Name:  FERNADO BRIGANTE  Date of Exam:   01/24/2024 Medical Rec #: [id number]         Accession #:    [id number] Date of Birth: Mar 09, 1928         Patient Gender: M Patient Age:   45 years  Exam Location:  [facility] Procedure:      VAS US  UPPER EXTREMITY VENOUS DUPLEX Referring Phys: ABDULLAHI HUSSEIN --------------------------------------------------------------------------------  Indications: swollen, rule out DVT. Comparison Study: No prior exam. Performing Technologist: Edilia Elden Appl  Examination Guidelines: A complete evaluation includes B-mode imaging, spectral Doppler, color Doppler, and power Doppler as needed of all accessible portions of each vessel. Bilateral testing is considered an integral part of a complete examination. Limited examinations for reoccurring indications may be performed as noted.  Right Findings: +----------+------------+---------+-----------+----------+-------+ RIGHT     CompressiblePhasicitySpontaneousPropertiesSummary +----------+------------+---------+-----------+----------+-------+ IJV           Full       Yes       Yes                      +----------+------------+---------+-----------+----------+-------+ Subclavian    Full       Yes       Yes                      +----------+------------+---------+-----------+----------+-------+ Axillary      Full       Yes       Yes                      +----------+------------+---------+-----------+----------+-------+ Brachial      Full       Yes       Yes                      +----------+------------+---------+-----------+----------+-------+ Radial        Full                                          +----------+------------+---------+-----------+----------+-------+ Ulnar         Full                                          +----------+------------+---------+-----------+----------+-------+ Cephalic      None       No        No                       +----------+------------+---------+-----------+----------+-------+ Basilic       Full       Yes       Yes                      +----------+------------+---------+-----------+----------+-------+ Superficial  vein thrombosis noted in the right cephalic vein  from the shoulder to the antecubital fossa. All segments in the forearm are compressible.  Left Findings: +----------+------------+---------+-----------+----------+-------+ LEFT      CompressiblePhasicitySpontaneousPropertiesSummary +----------+------------+---------+-----------+----------+-------+ IJV           Full       Yes       Yes                      +----------+------------+---------+-----------+----------+-------+ Subclavian    Full       Yes       Yes                      +----------+------------+---------+-----------+----------+-------+  Summary:  Right: No evidence of deep vein thrombosis in the upper extremity. Findings consistent with acute superficial vein thrombosis involving the right cephalic vein.  Left: No evidence of thrombosis in the subclavian.  *See table(s) above for measurements and observations.  Diagnosing physician: Debby Robertson Electronically signed by Debby Robertson on 01/24/2024 at 4:08:52 PM.    Final    ECHOCARDIOGRAM COMPLETE Result Date: 01/20/2024    ECHOCARDIOGRAM REPORT   Patient Name:   HIAWATHA MERRIOTT Dziedzic Date of Exam: 01/20/2024 Medical Rec #:  [id number]        Height:       65.0 in Accession #:    [id number]       Weight:       160.1 lb Date of Birth:  Oct 12, 1928        BSA:          1.799 m Patient Age:    89 years         BP:           115/59 mmHg Patient Gender: M                HR:           73 bpm. Exam Location:  Inpatient Procedure: 2D Echo, Cardiac Doppler, Color Doppler and Intracardiac            Opacification Agent (Both Spectral and Color Flow Doppler were            utilized during procedure). Indications:    Shock R57.9  History:        Patient has no prior history of Echocardiogram examinations.                 Signs/Symptoms:Shortness of Breath and Dyspnea; Risk                 Factors:Dyslipidemia, Hypertension and Pre-diabetes.  Sonographer:    Koleen Popper RDCS Referring Phys: [id number]  OMAR M ALBUSTAMI IMPRESSIONS  1. No left ventricular thrombus is seen (Definity  contrast was used). Left ventricular ejection fraction, by estimation, is 35 to 40%. The left ventricle has moderately decreased function. The left ventricle demonstrates global hypokinesis. Left ventricular diastolic parameters are consistent with Grade I diastolic dysfunction (impaired relaxation).  2. Right ventricular systolic function is moderately reduced. The right ventricular size is not well visualized. Tricuspid regurgitation signal is inadequate for assessing PA pressure.  3. Left atrial size was mildly dilated.  4. The mitral valve is degenerative. No evidence of mitral valve regurgitation. No evidence of mitral stenosis. Moderate mitral annular calcification.  5. There appears to be low flow low gradient mild aortic stenosis. The aortic valve is tricuspid. There is moderate calcification of the aortic valve. There is moderate thickening of the aortic valve. Aortic valve regurgitation is trivial. Mild  aortic valve stenosis.  6. The inferior vena cava is dilated in size with <50% respiratory variability, suggesting right atrial pressure of 15 mmHg. FINDINGS  Left Ventricle: No left ventricular thrombus is seen (Definity  contrast was used). Left ventricular ejection fraction, by estimation, is 35 to 40%. The left ventricle has moderately decreased function. The left ventricle demonstrates global hypokinesis.  Definity  contrast agent was given IV to delineate the left ventricular endocardial borders. The left ventricular internal cavity size was normal in size. There is borderline left ventricular hypertrophy of the basal-septal segment. Left ventricular diastolic parameters are consistent with Grade I diastolic dysfunction (impaired relaxation). Normal left ventricular filling pressure. Right Ventricle: The right ventricular size is not well visualized. Right vetricular wall thickness was not well visualized. Right ventricular  systolic function is moderately reduced. Tricuspid regurgitation signal is inadequate for assessing PA pressure. Left Atrium: Left atrial size was mildly dilated. Right Atrium: Right atrial size was normal in size. Pericardium: There is no evidence of pericardial effusion. Mitral Valve: The mitral valve is degenerative in appearance. There is mild thickening of the mitral valve leaflet(s). Moderate mitral annular calcification. No evidence of mitral valve regurgitation. No evidence of mitral valve stenosis. Tricuspid Valve: The tricuspid valve is normal in structure. Tricuspid valve regurgitation is mild. Aortic Valve: There appears to be low flow low gradient mild aortic stenosis. The aortic valve is tricuspid. There is moderate calcification of the aortic valve. There is moderate thickening of the aortic valve. Aortic valve regurgitation is trivial. Mild aortic stenosis is present. Aortic valve mean gradient measures 4.5 mmHg. Aortic valve peak gradient measures 8.5 mmHg. Aortic valve area, by VTI measures 1.36 cm. Pulmonic Valve: The pulmonic valve was not well visualized. Aorta: The aortic root and ascending aorta are structurally normal, with no evidence of dilitation. Venous: The inferior vena cava is dilated in size with less than 50% respiratory variability, suggesting right atrial pressure of 15 mmHg. IAS/Shunts: No atrial level shunt detected by color flow Doppler.  LEFT VENTRICLE PLAX 2D LVIDd:         4.30 cm     Diastology LVIDs:         3.00 cm     LV e' medial:    5.66 cm/s LV PW:         1.00 cm     LV E/e' medial:  9.3 LV IVS:        1.30 cm     LV e' lateral:   4.68 cm/s LVOT diam:     1.90 cm     LV E/e' lateral: 11.3 LV SV:         37 LV SV Index:   21 LVOT Area:     2.84 cm  LV Volumes (MOD) LV vol d, MOD A4C: 74.5 ml LV vol s, MOD A4C: 45.0 ml LV SV MOD A4C:     29.5 ml RIGHT VENTRICLE            IVC RV S prime:     6.64 cm/s  IVC diam: 2.50 cm TAPSE (M-mode): 1.7 cm LEFT ATRIUM              Index        RIGHT ATRIUM           Index LA diam:        3.80 cm 2.11 cm/m   RA Area:     13.30 cm LA Vol (A2C):   36.9 ml 20.51 ml/m  RA Volume:   29.80 ml  16.56 ml/m LA Vol (A4C):   29.0 ml 16.12 ml/m LA Biplane Vol: 33.0 ml 18.34 ml/m  AORTIC VALVE AV Area (Vmax):    1.48 cm AV Area (Vmean):   1.39 cm AV Area (VTI):     1.36 cm AV Vmax:           145.79 cm/s AV Vmean:          100.740 cm/s AV VTI:            0.273 m AV Peak Grad:      8.5 mmHg AV Mean Grad:      4.5 mmHg LVOT Vmax:         76.00 cm/s LVOT Vmean:        49.300 cm/s LVOT VTI:          0.131 m LVOT/AV VTI ratio: 0.48  AORTA Ao Root diam: 3.20 cm Ao Asc diam:  3.20 cm MITRAL VALVE MV Area (PHT): 3.12 cm    SHUNTS MV Decel Time: 243 msec    Systemic VTI:  0.13 m MV E velocity: 52.90 cm/s  Systemic Diam: 1.90 cm MV A velocity: 98.00 cm/s MV E/A ratio:  0.54 Mihai Croitoru MD Electronically signed by Jerel Balding MD Signature Date/Time: 01/20/2024/1:19:29 PM    Final    DG Chest Port 1 View Result Date: 01/20/2024 CLINICAL DATA:  Respiratory distress. EXAM: PORTABLE CHEST 1 VIEW COMPARISON:  Prior today, and chest CT on 01/19/2024 FINDINGS: The heart size is within normal limits. Right jugular central venous catheter remains in appropriate position. Surgical staples again seen in the right lung base. Bibasilar scarring shows no significant change. No evidence of superimposed pulmonary consolidation or pleural effusion. IMPRESSION: Bibasilar scarring. No acute findings. Electronically Signed   By: Norleen DELENA Kil M.D.   On: 01/20/2024 11:50   DG CHEST PORT 1 VIEW Result Date: 01/20/2024 EXAM: 1 VIEW(S) XRAY OF THE CHEST 01/20/2024 02:56:50 AM COMPARISON: Portable chest yesterday at 7:44 pm, chest CT yesterday at 8:13 pm. CLINICAL HISTORY: [id number] Encounter for central line placement [id number] Encounter for central line placement [id number] FINDINGS: LINES, TUBES AND DEVICES: There is a new right IJ (internal jugular) central line with the tip  at or slightly below the superior cavoatrial junction. LUNGS AND PLEURA: There are postsurgical changes and scarring in the right lung base, increased coarse linear atelectatic markings in the left lower lobe distribution. No pleural effusion. No pneumothorax. HEART AND MEDIASTINUM: Stable cardiomegaly. No vascular congestion is seen. There is tortuosity and atherosclerosis of the aorta with stable mediastinum. BONES AND SOFT TISSUES: No acute osseous abnormality. IMPRESSION: 1. Right IJ central line with the tip at or slightly below the superior cavoatrial junction. No pneumothorax. 2. Lungs are clear of infiltrates. 3. Stable cardiomegaly without vascular congestion. Electronically signed by: Francis Quam MD 01/20/2024 03:36 AM EST RP Workstation: [id number]   DG C-Arm 1-60 Min-No Report Result Date: 01/20/2024 Fluoroscopy was utilized by the requesting physician.  No radiographic interpretation.   CT Cervical Spine Wo Contrast Result Date: 01/19/2024 EXAM: CT CERVICAL SPINE WITHOUT CONTRAST 01/19/2024 09:42:30 PM TECHNIQUE: CT of the cervical spine was performed without the administration of intravenous contrast. Multiplanar reformatted images are provided for review. Automated exposure control, iterative reconstruction, and/or weight based adjustment of the mA/kV was utilized to reduce the radiation dose to as low as reasonably achievable. COMPARISON: Comparison with CT 11/30/2023. CLINICAL HISTORY: level 2 on thinners FINDINGS: CERVICAL SPINE: BONES AND ALIGNMENT:  Chronic anterolisthesis of C3. No acute fracture or traumatic malalignment. DEGENERATIVE CHANGES: Multilevel spondylosis, disc space height loss, and degenerative endplate changes which is greatest at C4-C5. Small multilevel facet arthropathy. No severe spinal canal narrowing. SOFT TISSUES: No prevertebral soft tissue swelling. IMPRESSION: 1. No acute abnormality of the cervical spine. Electronically signed by: Norman Gatlin MD 01/19/2024  10:10 PM EST RP Workstation: [id number]   CT Head Wo Contrast Result Date: 01/19/2024 EXAM: CT HEAD WITHOUT CONTRAST 01/19/2024 09:42:30 PM TECHNIQUE: CT of the head was performed without the administration of intravenous contrast. Automated exposure control, iterative reconstruction, and/or weight based adjustment of the mA/kV was utilized to reduce the radiation dose to as low as reasonably achievable. COMPARISON: CT 11/30/2023 CLINICAL HISTORY: fall, delirium FINDINGS: BRAIN AND VENTRICLES: No acute hemorrhage. No evidence of acute infarct. No hydrocephalus. No extra-axial collection. No mass effect or midline shift. Chronic microvascular ischemia and generalized atrophy. ORBITS: No acute abnormality. SINUSES: No acute abnormality. SOFT TISSUES AND SKULL: No acute soft tissue abnormality. No skull fracture. IMPRESSION: 1. No acute intracranial abnormality. Electronically signed by: Norman Gatlin MD 01/19/2024 10:06 PM EST RP Workstation: [id number]   CT CHEST ABDOMEN PELVIS WO CONTRAST Result Date: 01/19/2024 CLINICAL DATA:  Fall EXAM: CT CHEST, ABDOMEN AND PELVIS WITHOUT CONTRAST TECHNIQUE: Multidetector CT imaging of the chest, abdomen and pelvis was performed following the standard protocol without IV contrast. RADIATION DOSE REDUCTION: This exam was performed according to the departmental dose-optimization program which includes automated exposure control, adjustment of the mA and/or kV according to patient size and/or use of iterative reconstruction technique. COMPARISON:  CT renal stone 12/13/2023. CT chest abdomen and pelvis 11/30/2023. FINDINGS: CT CHEST FINDINGS Cardiovascular: No significant vascular findings. Normal heart size. No pericardial effusion. There are atherosclerotic calcifications of the aorta and coronary arteries. Mediastinum/Nodes: No enlarged mediastinal, hilar, or axillary lymph nodes. Thyroid gland, trachea, and esophagus demonstrate no significant findings. There is a small  hiatal hernia. Lungs/Pleura: There are atelectatic changes in the bilateral lower lobes. There some calcified pleural plaques in the inferior right lower hemithorax. There is scarring in both lung apices. There is no pleural effusion or pneumothorax identified. 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 Bones are diffusely osteopenic. There are healing/healed posterior right fourth through tenth rib fractures. There also multiple healing/healed anterior right rib fractures. There is questionable acute nondisplaced anterior right second rib fracture there are healing posterior left a, ninth and eleventh rib fractures. There is age indeterminate posterior left tenth rib fracture. These all appear new from 11/30/2023. CT ABDOMEN PELVIS FINDINGS Hepatobiliary: Gallstones are present. No focal liver lesion. No biliary ductal dilatation. Pancreas: There is diffuse fatty infiltration of the pancreas. Spleen: Normal in size without focal abnormality. Adrenals/Urinary Tract: There is a 2 mm calculus in the mid left ureter with mild left-sided hydronephrosis and left perinephric stranding and fluid. The adrenal glands, right kidney and bladder are within normal limits. Stomach/Bowel: Stomach is within normal limits. Appendix appears normal. No evidence of bowel wall thickening, distention, or inflammatory changes. There is sigmoid colon diverticulosis. There is a small hiatal hernia. Vascular/Lymphatic: There severe atherosclerotic calcifications of the aorta. Abdominal aortic aneurysm measures 4.0 cm, unchanged. 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 No enlarged lymph nodes are identified. Reproductive: Prostate gland is enlarged. Other: No abdominal wall hernia or abnormality. No abdominopelvic ascites. Musculoskeletal: The bones  are diffusely osteopenic. No acute  fractures are identified. 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 IMPRESSION: 1. 2 mm calculus in the mid left ureter with mild obstructive uropathy. 2. Multiple healing/healed bilateral rib fractures. There is questionable acute nondisplaced anterior right second rib fracture. 3. Age-indeterminate posterior left tenth rib fracture. 4. Cholelithiasis. 5. Stable abdominal aortic aneurysm measuring 4.0 cm. 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 6. Aortic atherosclerosis. Aortic Atherosclerosis (ICD10-I70.0). Electronically Signed   By: Greig Pique M.D.   On: 01/19/2024 20:31   DG Chest Port 1 View Result Date: 01/19/2024 EXAM: 1 VIEW(S) XRAY OF THE CHEST 01/19/2024 07:47:00 PM COMPARISON: 12/03/2023 CLINICAL HISTORY: Questionable sepsis - evaluate for abnormality FINDINGS: LUNGS AND PLEURA: Surgical suture at right lung base. Chronic interstitial coarsening. Chronic scarring at right lung base. No pleural effusion. No pneumothorax. HEART AND MEDIASTINUM: Aortic atherosclerosis is present. The cardiac silhouette is unremarkable. BONES AND SOFT TISSUES: No acute osseous abnormality. IMPRESSION: 1. No acute cardiopulmonary process identified. 2. 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 Electronically signed by: Norman Gatlin MD 01/19/2024 07:58 PM EST RP Workstation: [id number]    Microbiology: Results for orders placed or performed during the hospital encounter of 01/19/24  Urine Culture     Status: Abnormal   Collection Time: 01/19/24  7:34 PM   Specimen: Urine, Random  Result Value Ref Range Status   Specimen Description URINE, RANDOM  Final   Special Requests   Final    NONE Reflexed from [id number] Performed at [facility], [address] [place], [place] [address]    Culture >=100,000 COLONIES/mL ESCHERICHIA COLI (A)  Final   Report Status 01/21/2024  FINAL  Final   Organism ID, Bacteria ESCHERICHIA COLI (A)  Final      Susceptibility   Escherichia coli - MIC*    AMPICILLIN 8 SENSITIVE Sensitive     CEFAZOLIN  (URINE) Value in next row Sensitive      2 SENSITIVEThis is a modified FDA-approved test that has been validated and its performance characteristics determined by the reporting laboratory.  This laboratory is certified under the Clinical Laboratory Improvement Amendments CLIA as qualified to perform high complexity clinical laboratory testing.    CEFEPIME Value in next row Sensitive      2 SENSITIVEThis is a modified FDA-approved test that has been validated and its performance characteristics determined by the reporting laboratory.  This laboratory is certified under the Clinical Laboratory Improvement Amendments CLIA as qualified to perform high complexity clinical laboratory testing.    ERTAPENEM Value in next row Sensitive      2 SENSITIVEThis is a modified FDA-approved test that has been validated and its performance characteristics determined by the reporting laboratory.  This laboratory is certified under the Clinical Laboratory Improvement Amendments CLIA as qualified to perform high complexity clinical laboratory testing.    CEFTRIAXONE  Value in next row Sensitive      2 SENSITIVEThis is a modified FDA-approved test that has been validated and its performance characteristics determined by the reporting laboratory.  This laboratory is certified under the Clinical Laboratory Improvement Amendments CLIA as qualified to perform high complexity clinical laboratory testing.    CIPROFLOXACIN Value in next row Sensitive      2 SENSITIVEThis is a modified FDA-approved test that has been validated and its performance characteristics determined by the reporting laboratory.  This laboratory is certified  under the Clinical Laboratory Improvement Amendments CLIA as qualified to perform high complexity clinical laboratory testing.    GENTAMICIN  Value in next row Sensitive      2 SENSITIVEThis is a modified FDA-approved test that has been validated and its performance characteristics determined by the reporting laboratory.  This laboratory is certified under the Clinical Laboratory Improvement Amendments CLIA as qualified to perform high complexity clinical laboratory testing.    NITROFURANTOIN Value in next row Sensitive      2 SENSITIVEThis is a modified FDA-approved test that has been validated and its performance characteristics determined by the reporting laboratory.  This laboratory is certified under the Clinical Laboratory Improvement Amendments CLIA as qualified to perform high complexity clinical laboratory testing.    TRIMETH/SULFA Value in next row Sensitive      2 SENSITIVEThis is a modified FDA-approved test that has been validated and its performance characteristics determined by the reporting laboratory.  This laboratory is certified under the Clinical Laboratory Improvement Amendments CLIA as qualified to perform high complexity clinical laboratory testing.    AMPICILLIN/SULBACTAM Value in next row Sensitive      2 SENSITIVEThis is a modified FDA-approved test that has been validated and its performance characteristics determined by the reporting laboratory.  This laboratory is certified under the Clinical Laboratory Improvement Amendments CLIA as qualified to perform high complexity clinical laboratory testing.    PIP/TAZO Value in next row Sensitive      <=4 SENSITIVEThis is a modified FDA-approved test that has been validated and its performance characteristics determined by the reporting laboratory.  This laboratory is certified under the Clinical Laboratory Improvement Amendments CLIA as qualified to perform high complexity clinical laboratory testing.    MEROPENEM Value in next row Sensitive      <=4 SENSITIVEThis is a modified FDA-approved test that has been validated and its performance characteristics determined by the  reporting laboratory.  This laboratory is certified under the Clinical Laboratory Improvement Amendments CLIA as qualified to perform high complexity clinical laboratory testing.    * >=100,000 COLONIES/mL ESCHERICHIA COLI  Blood Culture (routine x 2)     Status: Abnormal   Collection Time: 01/19/24  7:44 PM   Specimen: BLOOD  Result Value Ref Range Status   Specimen Description BLOOD SITE NOT SPECIFIED  Final   Special Requests   Final    BOTTLES DRAWN AEROBIC AND ANAEROBIC Blood Culture adequate volume   Culture  Setup Time   Final    GRAM NEGATIVE RODS IN BOTH AEROBIC AND ANAEROBIC BOTTLES CRITICAL RESULT CALLED TO, READ BACK BY AND VERIFIED WITH: PHARMD J LEDFORD 01/20/2024 @ 0622 BY AB Performed at [facility], [address] [place], [place] [address]    Culture ESCHERICHIA COLI (A)  Final   Report Status 01/22/2024 FINAL  Final   Organism ID, Bacteria ESCHERICHIA COLI  Final      Susceptibility   Escherichia coli - MIC*    AMPICILLIN 8 SENSITIVE Sensitive     CEFAZOLIN  (NON-URINE) 4 INTERMEDIATE Intermediate     CEFEPIME <=0.12 SENSITIVE Sensitive     ERTAPENEM <=0.12 SENSITIVE Sensitive     CEFTRIAXONE  <=0.25 SENSITIVE Sensitive     CIPROFLOXACIN <=0.06 SENSITIVE Sensitive     GENTAMICIN <=1 SENSITIVE Sensitive     MEROPENEM <=0.25 SENSITIVE Sensitive     TRIMETH/SULFA <=20 SENSITIVE Sensitive     AMPICILLIN/SULBACTAM <=2 SENSITIVE Sensitive     PIP/TAZO Value in next row Sensitive      <=  4 SENSITIVEThis is a modified FDA-approved test that has been validated and its performance characteristics determined by the reporting laboratory.  This laboratory is certified under the Clinical Laboratory Improvement Amendments CLIA as qualified to perform high complexity clinical laboratory testing.    * ESCHERICHIA COLI  Blood Culture ID Panel (Reflexed)     Status: Abnormal   Collection Time: 01/19/24  7:44 PM  Result Value Ref Range Status   Enterococcus faecalis NOT  DETECTED NOT DETECTED Final   Enterococcus Faecium NOT DETECTED NOT DETECTED Final   Listeria monocytogenes NOT DETECTED NOT DETECTED Final   Staphylococcus species NOT DETECTED NOT DETECTED Final   Staphylococcus aureus (BCID) NOT DETECTED NOT DETECTED Final   Staphylococcus epidermidis NOT DETECTED NOT DETECTED Final   Staphylococcus lugdunensis NOT DETECTED NOT DETECTED Final   Streptococcus species NOT DETECTED NOT DETECTED Final   Streptococcus agalactiae NOT DETECTED NOT DETECTED Final   Streptococcus pneumoniae NOT DETECTED NOT DETECTED Final   Streptococcus pyogenes NOT DETECTED NOT DETECTED Final   A.calcoaceticus-baumannii NOT DETECTED NOT DETECTED Final   Bacteroides fragilis NOT DETECTED NOT DETECTED Final   Enterobacterales DETECTED (A) NOT DETECTED Final    Comment: Enterobacterales represent a large order of gram negative bacteria, not a single organism. CRITICAL RESULT CALLED TO, READ BACK BY AND VERIFIED WITH: PHARMD J LEDFORD 01/20/2024 @ 0622 BY AB    Enterobacter cloacae complex NOT DETECTED NOT DETECTED Final   Escherichia coli DETECTED (A) NOT DETECTED Final    Comment: CRITICAL RESULT CALLED TO, READ BACK BY AND VERIFIED WITH: PHARMD J LEDFORD 01/20/2024 @ 0622 BY AB    Klebsiella aerogenes NOT DETECTED NOT DETECTED Final   Klebsiella oxytoca NOT DETECTED NOT DETECTED Final   Klebsiella pneumoniae NOT DETECTED NOT DETECTED Final   Proteus species NOT DETECTED NOT DETECTED Final   Salmonella species NOT DETECTED NOT DETECTED Final   Serratia marcescens NOT DETECTED NOT DETECTED Final   Haemophilus influenzae NOT DETECTED NOT DETECTED Final   Neisseria meningitidis NOT DETECTED NOT DETECTED Final   Pseudomonas aeruginosa NOT DETECTED NOT DETECTED Final   Stenotrophomonas maltophilia NOT DETECTED NOT DETECTED Final   Candida albicans NOT DETECTED NOT DETECTED Final   Candida auris NOT DETECTED NOT DETECTED Final   Candida glabrata NOT DETECTED NOT DETECTED Final    Candida krusei NOT DETECTED NOT DETECTED Final   Candida parapsilosis NOT DETECTED NOT DETECTED Final   Candida tropicalis NOT DETECTED NOT DETECTED Final   Cryptococcus neoformans/gattii NOT DETECTED NOT DETECTED Final   CTX-M ESBL NOT DETECTED NOT DETECTED Final   Carbapenem resistance IMP NOT DETECTED NOT DETECTED Final   Carbapenem resistance KPC NOT DETECTED NOT DETECTED Final   Carbapenem resistance NDM NOT DETECTED NOT DETECTED Final   Carbapenem resist OXA 48 LIKE NOT DETECTED NOT DETECTED Final   Carbapenem resistance VIM NOT DETECTED NOT DETECTED Final    Comment: Performed at [facility], [address], [place], [place] [address]  Blood Culture (routine x 2)     Status: Abnormal   Collection Time: 01/19/24  8:26 PM   Specimen: BLOOD  Result Value Ref Range Status   Specimen Description BLOOD SITE NOT SPECIFIED  Final   Special Requests   Final    BOTTLES DRAWN AEROBIC AND ANAEROBIC Blood Culture adequate volume   Culture  Setup Time   Final    GRAM NEGATIVE RODS IN BOTH AEROBIC AND ANAEROBIC BOTTLES CRITICAL VALUE NOTED.  VALUE IS CONSISTENT WITH PREVIOUSLY REPORTED AND  CALLED VALUE.    Culture (A)  Final    ESCHERICHIA COLI SUSCEPTIBILITIES PERFORMED ON PREVIOUS CULTURE WITHIN THE LAST 5 DAYS. Performed at [facility], [address] [place], [place] [address]    Report Status 01/22/2024 FINAL  Final  Resp panel by RT-PCR (RSV, Flu A&B, Covid) Anterior Nasal Swab     Status: None   Collection Time: 01/19/24  8:29 PM   Specimen: Anterior Nasal Swab  Result Value Ref Range Status   SARS Coronavirus 2 by RT PCR NEGATIVE NEGATIVE Final   Influenza A by PCR NEGATIVE NEGATIVE Final   Influenza B by PCR NEGATIVE NEGATIVE Final    Comment: (NOTE) The Xpert Xpress SARS-CoV-2/FLU/RSV plus assay is intended as an aid in the diagnosis of influenza from Nasopharyngeal swab specimens and should not be used as a sole basis for treatment. Nasal washings  and aspirates are unacceptable for Xpert Xpress SARS-CoV-2/FLU/RSV testing.  Fact Sheet for Patients: bloggercourse.com  Fact Sheet for Healthcare Providers: seriousbroker.it  This test is not yet approved or cleared by the [place]  FDA and has been authorized for detection and/or diagnosis of SARS-CoV-2 by FDA under an Emergency Use Authorization (EUA). This EUA will remain in effect (meaning this test can be used) for the duration of the COVID-19 declaration under Section 564(b)(1) of the Act, 21 U.S.C. section 360bbb-3(b)(1), unless the authorization is terminated or revoked.     Resp Syncytial Virus by PCR NEGATIVE NEGATIVE Final    Comment: (NOTE) Fact Sheet for Patients: bloggercourse.com  Fact Sheet for Healthcare Providers: seriousbroker.it  This test is not yet approved or cleared by the [place]  FDA and has been authorized for detection and/or diagnosis of SARS-CoV-2 by FDA under an Emergency Use Authorization (EUA). This EUA will remain in effect (meaning this test can be used) for the duration of the COVID-19 declaration under Section 564(b)(1) of the Act, 21 U.S.C. section 360bbb-3(b)(1), unless the authorization is terminated or revoked.  Performed at [facility], [address], [place], [place] [address]   MRSA Next Gen by PCR, Nasal     Status: None   Collection Time: 01/20/24 12:32 AM   Specimen: Nasal Mucosa; Nasal Swab  Result Value Ref Range Status   MRSA by PCR Next Gen NOT DETECTED NOT DETECTED Final    Comment: (NOTE) The GeneXpert MRSA Assay (FDA approved for NASAL specimens only), is one component of a comprehensive MRSA colonization surveillance program. It is not intended to diagnose MRSA infection nor to guide or monitor treatment for MRSA infections. Test performance is not FDA approved in patients less than 42  years old. Performed at [facility], [address], [place], [place] [address]     Labs: CBC: Recent Labs  Lab 01/24/24 [phone] 01/25/24 0246 01/26/24 0459 01/27/24 0242 01/29/24 0531  WBC 26.6* 21.5* 23.0* 19.1* 13.0*  HGB 11.5* 11.0* 11.4* 11.3* 11.2*  HCT 35.0* 34.0* 34.8* 35.3* 33.8*  MCV 94.3 94.4 93.5 95.4 95.2  PLT 53* 79* 136* 175 329   Basic Metabolic Panel: Recent Labs  Lab 01/23/24 0327 01/23/24 1934 01/24/24 0822 01/26/24 0459 01/28/24 1035 01/29/24 0531  NA 139  --  140 140 139 141  K 4.1  --  4.2 4.1 4.1 3.7  CL 107  --  110 105 104 106  CO2 24  --  24 26 28 26   GLUCOSE 121*  --  102* 128* 124* 115*  BUN 42*  --  27* 20 24* 27*  CREATININE 1.18  --  0.89 0.87 0.90 0.85  CALCIUM  8.0*  --  8.2* 8.9 8.3* 8.4*  MG 2.4  --   --   --   --   --   PHOS 1.6* 2.5  --   --   --   --     Discharge time spent: 35 minutes.  Signed: Elgin Lam, MD [facility] 01/29/2024

## 2024-01-29 NOTE — Progress Notes (Signed)
 PROGRESS NOTE    CARVER MURAKAMI  FMW:[id number] DOB: 1928/10/03 DOA: 01/19/2024 PCP: [name] ORN, MD   Brief Narrative: Jerry [name] is a 89 y.o. male with a history of AAA, popliteal aneurysm s/p stent and DAPT, hyperlipidemia, hypertension, subdural hematoma, hyperlipidemia.  Patient presented secondary to a fall and delirium, found to have evidence of septic shock secondary to obstructive uropathy, in addition to pyelonephritis and bacteremia. Patient required ICU admission and intubation. Urology consulted and performed urgent bilateral ureteral stent placements. Symptoms improving with treatment.   Assessment and Plan:  Septic shock Present on admission. Secondary to acute pyelonephritis and obstructive uropathy with evidence of E. Coli bacteremia. Patient started empirically on Vancomycin  and Zosyn  initially and transitioned to Ceftriaxone  IV. Patient required ICU admission and was started on dobutamine , vasopressin , phenylephrine  and norepinephrine . Vasopressors weaned off with treatment of infection and ureteral obstruction.  Acute pyelonephritis E. Coli bacteremia Secondary to left ureter obstruction. Urine culture significant for E. Coli. Blood culture also with E. Coli. Empiric Vancomycin  and Zosyn  transitioned to Ceftriaxone  for management and completed 7 days of treatment. Continued improvement in WBC after antibiotic cessation.  Bilateral ureteral stones Left ureter obstruction Urology consulted and performed cystoscopy with insertion of bilateral ureteral stents on 11/29. Per urology, patient can have foley catheter removed. -Will discontinue foley catheter once Lasix  IV diuresis stopped  Acute metabolic encephalopathy Secondary to sepsis and acute infection. Resolved with treatment.  New onset HFrEF Unclear etiology, but patient does have a history of PAD, so ischemia is a possible etiology. Could also be related to sepsis.  Troponin elevated at 147 with  downtrend to 125. Transthoracic Echocardiogram significant for an LVEF of 35-40% with global hypokinesis. Patient managed, shortly, with Lasix . Cardiology consulted and have restarted Lasix  IV diuresis -Cardiology recommendations: Lasix  IV, increase to Toprol  XL 50 mg daily -Daily weights/strict in/out -Cardiac monitoring  Atrial fibrillation/flutter with RVR Noted to have an irregular rhythm on exam. EKG obtained and confirms atrial fibrillation. Complicated by low LVEF. Cardiology consulted. Not a candidate for anticoagulation per cardiology secondary to fall/SDH history. Patient transitioned to atrial flutter, noted on telemetry 12/7. Patient appears to have converted to sinus rhythm on 12/8. -Cardiology recommendations: Toprol  XL 50 mg daily -Cardiac monitoring  Intractable hiccups -Start Gabapentin  100 mg TID and titrate up for effect. Discontinue once hiccups are resolved  Acute respiratory failure with hypoxia Patient with respiratory distress and hypoxia secondary to COPD vs heart failure prior to admission to ICU. Resolved.  Lactic acidosis Lactic acid of 4.1 on admission. Resolved with management of sepsis.  AKI Baseline creatinine of around 0.9. Creatinine of 1.75 with peak of 2.16. Complicated by mild hydronephrosis from ureteral obstruction. Creatinine improved. AKI resolved.   Elevated TSH Elevated free T4 with low T3. Discordant. Possibly related to recovery from patient's critical illness. Will recommend repeat thyroid studies in a few weeks.  Right upper extremity SVT Diagnosed on 12/3. -Supportive care  PAD Patient managed on DAPT for stent x2 at SFA/popliteal artery. Antiplatelets held on admission secondary to sepsis and thrombocytopenia. Patient with atrial fibrillation, however per cardiology, unlikely he will be started on anticoagulation. -Continue aspirin  and Plavix   Thrombocytopenia Likely secondary to acute infection. Platelets down to a low of 30,000.  Improved to 79,000.  Cough Likely related to recent intubation. Clear production. -Tussionex at bedtime as needed  History of subdural hematoma Noted. No evidence of hematoma on CT head obtained this admission.  AAA Noted.  Cholelithiasis Noted  incidentally on CT imaging. Asymptomatic.  Bilateral rib fractures Noted. No evidence for acute fracture.  Aortic atherosclerosis Noted.  Moderate malnutrition -Dietitian recommendations (12/2): Liberalize diet to regular in the setting of advanced age Ensure Plus High Protein po BID, each supplement provides 350 kcal and 20 grams of protein. Magic cup TID with meals, each supplement provides 290 kcal and 9 grams of protein Continue MVI with minerals daily  Constipation -Continue MiraLAX  and Colace -Dulcolax suppository  Pressure injury Right/mid/left sacrum. Unclear if present on admission.   DVT prophylaxis: SCDs Code Status:   Code Status: Limited: Do not attempt resuscitation (DNR) -DNR-LIMITED -Do Not Intubate/DNI  Family Communication: Wife and son at bedside Disposition Plan: Discharge possibly to acute inpatient rehabilitation likely in 1-2 days pending  ongoing cardiology recommendations   Consultants:  PCCM Urology Cardiology  Procedures:  Transthoracic Echocardiogram Cystoscopy with insertion of bilateral ureteral stents.   Antimicrobials: Vancomycin  Zosyn  Ceftriaxone     Subjective: Patient states he feels much better today. No issues overnight.  Objective: BP 97/81   Pulse 92   Temp 98 F (36.7 C)   Resp 16   Ht 5' 5 (1.651 m)   Wt 70.8 kg   SpO2 99%   BMI 25.97 kg/m   Examination:  General exam: Appears calm and comfortable. Respiratory system: Clear to auscultation. Respiratory effort normal. Cardiovascular system: S1 & S2 heard, RRR. No murmur. Gastrointestinal system: Abdomen is nondistended, soft and nontender. Normal bowel sounds heard. Central nervous system: Alert and oriented.  No focal neurological deficits. Musculoskeletal: No edema. No calf tenderness Psychiatry: Judgement and insight appear normal. Mood & affect appropriate.    Data Reviewed: I have personally reviewed following labs and imaging studies  CBC Lab Results  Component Value Date   WBC 13.0 (H) 01/29/2024   RBC 3.55 (L) 01/29/2024   HGB 11.2 (L) 01/29/2024   HCT 33.8 (L) 01/29/2024   MCV 95.2 01/29/2024   MCH 31.5 01/29/2024   PLT 329 01/29/2024   MCHC 33.1 01/29/2024   RDW 15.1 01/29/2024   LYMPHSABS 0.1 (L) 01/19/2024   MONOABS 0.1 01/19/2024   EOSABS 0.0 01/19/2024   BASOSABS 0.0 01/19/2024     Last metabolic panel Lab Results  Component Value Date   NA 141 01/29/2024   K 3.7 01/29/2024   CL 106 01/29/2024   CO2 26 01/29/2024   BUN 27 (H) 01/29/2024   CREATININE 0.85 01/29/2024   GLUCOSE 115 (H) 01/29/2024   GFRNONAA >60 01/29/2024   GFRAA >60 10/17/2018   CALCIUM  8.4 (L) 01/29/2024   PHOS 2.5 01/23/2024   PROT 5.0 (L) 01/22/2024   ALBUMIN  2.5 (L) 01/22/2024   BILITOT 0.7 01/22/2024   ALKPHOS 168 (H) 01/22/2024   AST 80 (H) 01/22/2024   ALT 145 (H) 01/22/2024   ANIONGAP 9 01/29/2024    GFR: Estimated Creatinine Clearance: 47.2 mL/min (by C-G formula based on SCr of 0.85 mg/dL).  Recent Results (from the past 240 hours)  Urine Culture     Status: Abnormal   Collection Time: 01/19/24  7:34 PM   Specimen: Urine, Random  Result Value Ref Range Status   Specimen Description URINE, RANDOM  Final   Special Requests   Final    NONE Reflexed from [id number] Performed at [facility], [address], [place], [place] [address]    Culture >=100,000 COLONIES/mL ESCHERICHIA COLI (A)  Final   Report Status 01/21/2024 FINAL  Final   Organism ID, Bacteria ESCHERICHIA COLI (A)  Final      Susceptibility   Escherichia coli - MIC*    AMPICILLIN 8 SENSITIVE Sensitive     CEFAZOLIN  (URINE) Value in next row Sensitive      2 SENSITIVEThis is a modified FDA-approved test  that has been validated and its performance characteristics determined by the reporting laboratory.  This laboratory is certified under the Clinical Laboratory Improvement Amendments CLIA as qualified to perform high complexity clinical laboratory testing.    CEFEPIME Value in next row Sensitive      2 SENSITIVEThis is a modified FDA-approved test that has been validated and its performance characteristics determined by the reporting laboratory.  This laboratory is certified under the Clinical Laboratory Improvement Amendments CLIA as qualified to perform high complexity clinical laboratory testing.    ERTAPENEM Value in next row Sensitive      2 SENSITIVEThis is a modified FDA-approved test that has been validated and its performance characteristics determined by the reporting laboratory.  This laboratory is certified under the Clinical Laboratory Improvement Amendments CLIA as qualified to perform high complexity clinical laboratory testing.    CEFTRIAXONE  Value in next row Sensitive      2 SENSITIVEThis is a modified FDA-approved test that has been validated and its performance characteristics determined by the reporting laboratory.  This laboratory is certified under the Clinical Laboratory Improvement Amendments CLIA as qualified to perform high complexity clinical laboratory testing.    CIPROFLOXACIN Value in next row Sensitive      2 SENSITIVEThis is a modified FDA-approved test that has been validated and its performance characteristics determined by the reporting laboratory.  This laboratory is certified under the Clinical Laboratory Improvement Amendments CLIA as qualified to perform high complexity clinical laboratory testing.    GENTAMICIN Value in next row Sensitive      2 SENSITIVEThis is a modified FDA-approved test that has been validated and its performance characteristics determined by the reporting laboratory.  This laboratory is certified under the Clinical Laboratory Improvement  Amendments CLIA as qualified to perform high complexity clinical laboratory testing.    NITROFURANTOIN Value in next row Sensitive      2 SENSITIVEThis is a modified FDA-approved test that has been validated and its performance characteristics determined by the reporting laboratory.  This laboratory is certified under the Clinical Laboratory Improvement Amendments CLIA as qualified to perform high complexity clinical laboratory testing.    TRIMETH/SULFA Value in next row Sensitive      2 SENSITIVEThis is a modified FDA-approved test that has been validated and its performance characteristics determined by the reporting laboratory.  This laboratory is certified under the Clinical Laboratory Improvement Amendments CLIA as qualified to perform high complexity clinical laboratory testing.    AMPICILLIN/SULBACTAM Value in next row Sensitive      2 SENSITIVEThis is a modified FDA-approved test that has been validated and its performance characteristics determined by the reporting laboratory.  This laboratory is certified under the Clinical Laboratory Improvement Amendments CLIA as qualified to perform high complexity clinical laboratory testing.    PIP/TAZO Value in next row Sensitive      <=4 SENSITIVEThis is a modified FDA-approved test that has been validated and its performance characteristics determined by the reporting laboratory.  This laboratory is certified under the Clinical Laboratory Improvement Amendments CLIA as qualified to perform high complexity clinical laboratory testing.    MEROPENEM Value in next row Sensitive      <=4 SENSITIVEThis is a modified FDA-approved test that has been validated and  its performance characteristics determined by the reporting laboratory.  This laboratory is certified under the Clinical Laboratory Improvement Amendments CLIA as qualified to perform high complexity clinical laboratory testing.    * >=100,000 COLONIES/mL ESCHERICHIA COLI  Blood Culture (routine x 2)      Status: Abnormal   Collection Time: 01/19/24  7:44 PM   Specimen: BLOOD  Result Value Ref Range Status   Specimen Description BLOOD SITE NOT SPECIFIED  Final   Special Requests   Final    BOTTLES DRAWN AEROBIC AND ANAEROBIC Blood Culture adequate volume   Culture  Setup Time   Final    GRAM NEGATIVE RODS IN BOTH AEROBIC AND ANAEROBIC BOTTLES CRITICAL RESULT CALLED TO, READ BACK BY AND VERIFIED WITH: PHARMD J LEDFORD 01/20/2024 @ 0622 BY AB Performed at [facility], [address], [place], [place] [address]    Culture ESCHERICHIA COLI (A)  Final   Report Status 01/22/2024 FINAL  Final   Organism ID, Bacteria ESCHERICHIA COLI  Final      Susceptibility   Escherichia coli - MIC*    AMPICILLIN 8 SENSITIVE Sensitive     CEFAZOLIN  (NON-URINE) 4 INTERMEDIATE Intermediate     CEFEPIME <=0.12 SENSITIVE Sensitive     ERTAPENEM <=0.12 SENSITIVE Sensitive     CEFTRIAXONE  <=0.25 SENSITIVE Sensitive     CIPROFLOXACIN <=0.06 SENSITIVE Sensitive     GENTAMICIN <=1 SENSITIVE Sensitive     MEROPENEM <=0.25 SENSITIVE Sensitive     TRIMETH/SULFA <=20 SENSITIVE Sensitive     AMPICILLIN/SULBACTAM <=2 SENSITIVE Sensitive     PIP/TAZO Value in next row Sensitive      <=4 SENSITIVEThis is a modified FDA-approved test that has been validated and its performance characteristics determined by the reporting laboratory.  This laboratory is certified under the Clinical Laboratory Improvement Amendments CLIA as qualified to perform high complexity clinical laboratory testing.    * ESCHERICHIA COLI  Blood Culture ID Panel (Reflexed)     Status: Abnormal   Collection Time: 01/19/24  7:44 PM  Result Value Ref Range Status   Enterococcus faecalis NOT DETECTED NOT DETECTED Final   Enterococcus Faecium NOT DETECTED NOT DETECTED Final   Listeria monocytogenes NOT DETECTED NOT DETECTED Final   Staphylococcus species NOT DETECTED NOT DETECTED Final   Staphylococcus aureus (BCID) NOT DETECTED NOT DETECTED  Final   Staphylococcus epidermidis NOT DETECTED NOT DETECTED Final   Staphylococcus lugdunensis NOT DETECTED NOT DETECTED Final   Streptococcus species NOT DETECTED NOT DETECTED Final   Streptococcus agalactiae NOT DETECTED NOT DETECTED Final   Streptococcus pneumoniae NOT DETECTED NOT DETECTED Final   Streptococcus pyogenes NOT DETECTED NOT DETECTED Final   A.calcoaceticus-baumannii NOT DETECTED NOT DETECTED Final   Bacteroides fragilis NOT DETECTED NOT DETECTED Final   Enterobacterales DETECTED (A) NOT DETECTED Final    Comment: Enterobacterales represent a large order of gram negative bacteria, not a single organism. CRITICAL RESULT CALLED TO, READ BACK BY AND VERIFIED WITH: PHARMD J LEDFORD 01/20/2024 @ 0622 BY AB    Enterobacter cloacae complex NOT DETECTED NOT DETECTED Final   Escherichia coli DETECTED (A) NOT DETECTED Final    Comment: CRITICAL RESULT CALLED TO, READ BACK BY AND VERIFIED WITH: PHARMD J LEDFORD 01/20/2024 @ 0622 BY AB    Klebsiella aerogenes NOT DETECTED NOT DETECTED Final   Klebsiella oxytoca NOT DETECTED NOT DETECTED Final   Klebsiella pneumoniae NOT DETECTED NOT DETECTED Final   Proteus species NOT DETECTED NOT DETECTED Final   Salmonella species NOT  DETECTED NOT DETECTED Final   Serratia marcescens NOT DETECTED NOT DETECTED Final   Haemophilus influenzae NOT DETECTED NOT DETECTED Final   Neisseria meningitidis NOT DETECTED NOT DETECTED Final   Pseudomonas aeruginosa NOT DETECTED NOT DETECTED Final   Stenotrophomonas maltophilia NOT DETECTED NOT DETECTED Final   Candida albicans NOT DETECTED NOT DETECTED Final   Candida auris NOT DETECTED NOT DETECTED Final   Candida glabrata NOT DETECTED NOT DETECTED Final   Candida krusei NOT DETECTED NOT DETECTED Final   Candida parapsilosis NOT DETECTED NOT DETECTED Final   Candida tropicalis NOT DETECTED NOT DETECTED Final   Cryptococcus neoformans/gattii NOT DETECTED NOT DETECTED Final   CTX-M ESBL NOT DETECTED NOT  DETECTED Final   Carbapenem resistance IMP NOT DETECTED NOT DETECTED Final   Carbapenem resistance KPC NOT DETECTED NOT DETECTED Final   Carbapenem resistance NDM NOT DETECTED NOT DETECTED Final   Carbapenem resist OXA 48 LIKE NOT DETECTED NOT DETECTED Final   Carbapenem resistance VIM NOT DETECTED NOT DETECTED Final    Comment: Performed at [facility], [address], [place], [place] [address]  Blood Culture (routine x 2)     Status: Abnormal   Collection Time: 01/19/24  8:26 PM   Specimen: BLOOD  Result Value Ref Range Status   Specimen Description BLOOD SITE NOT SPECIFIED  Final   Special Requests   Final    BOTTLES DRAWN AEROBIC AND ANAEROBIC Blood Culture adequate volume   Culture  Setup Time   Final    GRAM NEGATIVE RODS IN BOTH AEROBIC AND ANAEROBIC BOTTLES CRITICAL VALUE NOTED.  VALUE IS CONSISTENT WITH PREVIOUSLY REPORTED AND CALLED VALUE.    Culture (A)  Final    ESCHERICHIA COLI SUSCEPTIBILITIES PERFORMED ON PREVIOUS CULTURE WITHIN THE LAST 5 DAYS. Performed at [facility], [address], [place], [place] [address]    Report Status 01/22/2024 FINAL  Final  Resp panel by RT-PCR (RSV, Flu A&B, Covid) Anterior Nasal Swab     Status: None   Collection Time: 01/19/24  8:29 PM   Specimen: Anterior Nasal Swab  Result Value Ref Range Status   SARS Coronavirus 2 by RT PCR NEGATIVE NEGATIVE Final   Influenza A by PCR NEGATIVE NEGATIVE Final   Influenza B by PCR NEGATIVE NEGATIVE Final    Comment: (NOTE) The Xpert Xpress SARS-CoV-2/FLU/RSV plus assay is intended as an aid in the diagnosis of influenza from Nasopharyngeal swab specimens and should not be used as a sole basis for treatment. Nasal washings and aspirates are unacceptable for Xpert Xpress SARS-CoV-2/FLU/RSV testing.  Fact Sheet for Patients: bloggercourse.com  Fact Sheet for Healthcare Providers: seriousbroker.it  This test is not yet approved  or cleared by the [place]  FDA and has been authorized for detection and/or diagnosis of SARS-CoV-2 by FDA under an Emergency Use Authorization (EUA). This EUA will remain in effect (meaning this test can be used) for the duration of the COVID-19 declaration under Section 564(b)(1) of the Act, 21 U.S.C. section 360bbb-3(b)(1), unless the authorization is terminated or revoked.     Resp Syncytial Virus by PCR NEGATIVE NEGATIVE Final    Comment: (NOTE) Fact Sheet for Patients: bloggercourse.com  Fact Sheet for Healthcare Providers: seriousbroker.it  This test is not yet approved or cleared by the [place]  FDA and has been authorized for detection and/or diagnosis of SARS-CoV-2 by FDA under an Emergency Use Authorization (EUA). This EUA will remain in effect (meaning this test can be used) for the duration  of the COVID-19 declaration under Section 564(b)(1) of the Act, 21 U.S.C. section 360bbb-3(b)(1), unless the authorization is terminated or revoked.  Performed at [facility], [address], [place], [place] [address]   MRSA Next Gen by PCR, Nasal     Status: None   Collection Time: 01/20/24 12:32 AM   Specimen: Nasal Mucosa; Nasal Swab  Result Value Ref Range Status   MRSA by PCR Next Gen NOT DETECTED NOT DETECTED Final    Comment: (NOTE) The GeneXpert MRSA Assay (FDA approved for NASAL specimens only), is one component of a comprehensive MRSA colonization surveillance program. It is not intended to diagnose MRSA infection nor to guide or monitor treatment for MRSA infections. Test performance is not FDA approved in patients less than 56 years old. Performed at [facility], [address], [place], [place] [address]       Radiology Studies: No results found.     LOS: 10 days    Elgin Lam, MD [facility] 01/29/2024, 9:45 AM   If 7PM-7AM, please contact  night-coverage [email or web address]

## 2024-01-29 NOTE — Progress Notes (Deleted)
 Inpatient Rehabilitation Admission Medication Review by a Pharmacist  A complete drug regimen review was completed for this patient to identify any potential clinically significant medication issues.  High Risk Drug Classes Is patient taking? Indication by Medication  Antipsychotic No   Anticoagulant No   Antibiotic Yes Ceftriaxone  - pyelo/bactermia  Opioid No   Antiplatelet No   Hypoglycemics/insulin No   Vasoactive Medication No   Chemotherapy No   Other Yes Atorvastatin  - HLD Famotidine  - GERD Duonebs - short of breath Melatonin - sleep MVI/thiamine  - supplement     Type of Medication Issue Identified Description of Issue Recommendation(s)  Drug Interaction(s) (clinically significant)     Duplicate Therapy     Allergy     No Medication Administration End Date     Incorrect Dose     Additional Drug Therapy Needed     Significant med changes from prior encounter (inform family/care partners about these prior to discharge).    Other       Clinically significant medication issues were identified that warrant physician communication and completion of prescribed/recommended actions by midnight of the next day:  No  Name of provider notified for urgent issues identified:   Provider Method of Notification:     Pharmacist comments:   Time spent performing this drug regimen review (minutes):  20   Sergio Batch, PharmD, BCIDP, AAHIVP, CPP Infectious Disease Pharmacist

## 2024-01-30 LAB — CBC WITH DIFFERENTIAL/PLATELET
Abs Immature Granulocytes: 0.07 K/uL (ref 0.00–0.07)
Basophils Absolute: 0.1 K/uL (ref 0.0–0.1)
Basophils Relative: 1 %
Eosinophils Absolute: 0.2 K/uL (ref 0.0–0.5)
Eosinophils Relative: 2 %
HCT: 35.8 % — ABNORMAL LOW (ref 39.0–52.0)
Hemoglobin: 11.3 g/dL — ABNORMAL LOW (ref 13.0–17.0)
Immature Granulocytes: 1 %
Lymphocytes Relative: 9 %
Lymphs Abs: 0.9 K/uL (ref 0.7–4.0)
MCH: 30.9 pg (ref 26.0–34.0)
MCHC: 31.6 g/dL (ref 30.0–36.0)
MCV: 97.8 fL (ref 80.0–100.0)
Monocytes Absolute: 0.7 K/uL (ref 0.1–1.0)
Monocytes Relative: 7 %
Neutro Abs: 8.7 K/uL — ABNORMAL HIGH (ref 1.7–7.7)
Neutrophils Relative %: 80 %
Platelets: 361 K/uL (ref 150–400)
RBC: 3.66 MIL/uL — ABNORMAL LOW (ref 4.22–5.81)
RDW: 15 % (ref 11.5–15.5)
WBC: 10.6 K/uL — ABNORMAL HIGH (ref 4.0–10.5)
nRBC: 0 % (ref 0.0–0.2)

## 2024-01-30 LAB — COMPREHENSIVE METABOLIC PANEL WITH GFR
ALT: 76 U/L — ABNORMAL HIGH (ref 0–44)
AST: 39 U/L (ref 15–41)
Albumin: 2.4 g/dL — ABNORMAL LOW (ref 3.5–5.0)
Alkaline Phosphatase: 268 U/L — ABNORMAL HIGH (ref 38–126)
Anion gap: 14 (ref 5–15)
BUN: 27 mg/dL — ABNORMAL HIGH (ref 8–23)
CO2: 21 mmol/L — ABNORMAL LOW (ref 22–32)
Calcium: 8.6 mg/dL — ABNORMAL LOW (ref 8.9–10.3)
Chloride: 105 mmol/L (ref 98–111)
Creatinine, Ser: 1.04 mg/dL (ref 0.61–1.24)
GFR, Estimated: >60 mL/min (ref >60)
Glucose, Bld: 106 mg/dL — ABNORMAL HIGH (ref 70–99)
Potassium: 4 mmol/L (ref 3.5–5.1)
Sodium: 140 mmol/L (ref 135–145)
Total Bilirubin: 0.9 mg/dL (ref 0.0–1.2)
Total Protein: 5.8 g/dL — ABNORMAL LOW (ref 6.5–8.1)

## 2024-01-30 MED ORDER — PANTOPRAZOLE SODIUM 40 MG PO TBEC
40.0000 mg | DELAYED_RELEASE_TABLET | Freq: Every day | ORAL | Status: DC
  Administered 2024-01-30 – 2024-02-08 (×10): 40 mg via ORAL
  Filled 2024-01-30 (×9): qty 1

## 2024-01-30 MED ORDER — MENTHOL 3 MG MT LOZG
1.0000 | LOZENGE | OROMUCOSAL | Status: DC | PRN
  Administered 2024-01-30 – 2024-02-07 (×5): 3 mg via ORAL
  Filled 2024-01-30 (×4): qty 9

## 2024-01-30 MED ORDER — CHLORHEXIDINE GLUCONATE CLOTH 2 % EX PADS
6.0000 | MEDICATED_PAD | Freq: Two times a day (BID) | CUTANEOUS | Status: DC
  Administered 2024-01-30 – 2024-02-07 (×14): 6 via TOPICAL

## 2024-01-30 NOTE — Progress Notes (Signed)
 Inpatient Rehabilitation  Patient information reviewed and entered into eRehab system by [facility]. Karen Kays., CCC/SLP, PPS Coordinator.  Information including medical coding, functional ability and quality indicators will be reviewed and updated through discharge.

## 2024-01-30 NOTE — Progress Notes (Signed)
 PROGRESS NOTE   Subjective/Complaints:  No acute complaints.  No events overnight.  Labs appear stable; some slight uptrend in creatinine but BUN remains stable.  Elevated ALP and ALT.  White count downtrending.  Vital stable, urinary output looks good.  Complaining of ongoing hiccups with some chest soreness now, along with sore throat since extubation, asking for lozenge today.  ROS: Denies fevers, chills, N/V, abdominal pain, constipation, diarrhea, SOB, cough, chest pain, new weakness or paraesthesias.   + sore throat + Hiccups Objective:   No results found. Recent Labs    01/29/24 0531 01/30/24 0518  WBC 13.0* 10.6*  HGB 11.2* 11.3*  HCT 33.8* 35.8*  PLT 329 361   Recent Labs    01/29/24 0531 01/30/24 0518  NA 141 140  K 3.7 4.0  CL 106 105  CO2 26 21*  GLUCOSE 115* 106*  BUN 27* 27*  CREATININE 0.85 1.04  CALCIUM  8.4* 8.6*    Intake/Output Summary (Last 24 hours) at 01/30/2024 1044 Last data filed at 01/30/2024 0929 Gross per 24 hour  Intake 700 ml  Output 500 ml  Net 200 ml     Wound 01/29/24 1823 Pressure Injury Buttocks Left Stage 3 -  Full thickness tissue loss. Subcutaneous fat may be visible but bone, tendon or muscle are NOT exposed. (Active)     Wound 01/29/24 1824 Pressure Injury Sacrum Lower Stage 1 -  Intact skin with non-blanchable redness of a localized area usually over a bony prominence. (Active)     Wound 01/30/24 1039 Pressure Injury Heel Left Unstageable - Full thickness tissue loss in which the base of the injury is covered by slough (yellow, tan, gray, green or brown) and/or eschar (tan, brown or black) in the wound bed. (Active)     Wound 01/30/24 1040 Pressure Injury Foot Left;Lateral Unstageable - Full thickness tissue loss in which the base of the injury is covered by slough (yellow, tan, gray, green or brown) and/or eschar (tan, brown or black) in the wound bed. (Active)     Physical Exam: Vital Signs Blood pressure 129/66, pulse 86, temperature 98.1 F (36.7 C), resp. rate 19, height 5' 5 (1.651 m), weight 70.8 kg, SpO2 97%. Constitutional: No apparent distress. Appropriate appearance for age.  HENT: No JVD. Neck Supple. Trachea midline. Atraumatic, normocephalic. Eyes: PERRLA. EOMI. Visual fields grossly intact.  Cardiovascular: RRR, no murmurs/rub/gallops. No Edema. Peripheral pulses 2+  Respiratory: CTAB. No rales, rhonchi, or wheezing. On RA.  Abdomen: + bowel sounds, normoactive. No distention or tenderness. +Intractable hiccups.  GU: Not examined.  +Foley, draining red but clear urine. Skin: C/D/I. No apparent lesions. Sacral DTI, heel wounds as pictured below.          MSK:      No apparent deformity.    Neurologic exam:  Patient is alert, alert, and oriented x 3.   He is hard of hearing.  CN exam non-focal.  MMT: 4+/5 bilateral UE prox to distal. BLE 4-/5 HF, KE and 4/5 ADF/PF.  Sensory exam normal for light touch and pain in all 4 limbs.  No limb ataxia or cerebellar signs.  No abnormal tone appreciated.  Assessment/Plan: 1. Functional deficits which require 3+ hours per day of interdisciplinary therapy in a comprehensive inpatient rehab setting. Physiatrist is providing close team supervision and 24 hour management of active medical problems listed below. Physiatrist and rehab team continue to assess barriers to discharge/monitor patient progress toward functional and medical goals  Care Tool:  Bathing    Body parts bathed by patient: Right arm, Left arm, Chest, Abdomen, Front perineal area, Right upper leg, Left upper leg, Face, Buttocks   Body parts bathed by helper: Right lower leg, Left lower leg     Bathing assist Assist Level: Minimal Assistance - Patient > 75%     Upper Body Dressing/Undressing Upper body dressing   What is the patient wearing?: Pull over shirt    Upper body assist Assist Level:  Set up assist    Lower Body Dressing/Undressing Lower body dressing      What is the patient wearing?: Pants, Underwear/pull up     Lower body assist Assist for lower body dressing: Minimal Assistance - Patient > 75%     Toileting Toileting    Toileting assist Assist for toileting: Contact Guard/Touching assist     Transfers Chair/bed transfer  Transfers assist           Locomotion Ambulation   Ambulation assist              Walk 10 feet activity   Assist           Walk 50 feet activity   Assist           Walk 150 feet activity   Assist           Walk 10 feet on uneven surface  activity   Assist           Wheelchair     Assist               Wheelchair 50 feet with 2 turns activity    Assist            Wheelchair 150 feet activity     Assist          Blood pressure 129/66, pulse 86, temperature 98.1 F (36.7 C), resp. rate 19, height 5' 5 (1.651 m), weight 70.8 kg, SpO2 97%.   Medical Problem List and Plan: 1. Functional deficits secondary to septic shock secondary to acute pyelonephritis and obstructive uropathy with E. coli bacteremia.  Completing course of IV ceftriaxone              -patient may shower             -ELOS/Goals: 10-14 days, supervision goals with PT, and sup/min with OT - 12/18 DC date   - 12/9: CGA today  Stable to continue inpatient rehab  2.  Antithrombotics: -DVT/anticoagulation/right upper extremity SVT 12/3: Supportive care.  Mechanical: Antiembolism stockings, thigh (TED hose) Bilateral lower extremities             -antiplatelet therapy: Aspirin  81 mg daily and Plavix  75 mg daily 3. Pain Management:  Tylenol  as needed             -denied pain today  - 12-9: Monitor Tylenol  use due to mild LFT elevation; seems rare, so unlikely contributor.  Likely hypoperfusion with sepsis.  Trend with next labs.  4. Mood/Behavior/Sleep: Melatonin 5 mg nightly as needed              -antipsychotic agents: N/A 5. Neuropsych/cognition: This patient is capable  of making decisions on his own behalf. 6. Skin/Wound Care/WOC follow-up: Pressure injury to the sacrum  - Offloading for sacrum, heals with Mepilex 7. Fluids/Electrolytes/Nutrition: Routine IN and outs with follow-up chemistries             -decreased nutritional storage--encourage PO, appetite is not great but it's picking up per son  - 12-9: Eating okay, monitor 8.  Bilateral ureteral stones/left ureteral obstruction.  Status post bilateral ureteral stents 11/29 per Dr.Wrenn.  Foley tube in place             -has persistent hematuria and sediment   - Family asking about timeline for stent removal--likely outpatient per urology.  9.  Acute metabolic encephalopathy secondary to sepsis and acute infection.  Resolved with treatment. He is HOH 10.  New onset HFrEF.  Echocardiogram LVEF 35 to 40% with global hypokinesis.  Patient received IV diuresis and completed.  Elevated troponin felt to be related to demand ischemia trending down to 125   - no s/s volume overload Filed Weights   01/29/24 1829  Weight: 70.8 kg     11.  Lactic acidosis.  Resolved with management of sepsis 12.  AKI.  Baseline creatinine 0.9.  Creatinine 1.75 with peak of 2.16.  Complicated by mild hydronephrosis from ureteral obstruction.  Creatinine improved AKI resolved.  Follow-up chemistries   - 12/9: labs with stable BUN but mildly uptrending creatinine; encourage p.o. fluids.  13.  History of PAD.  Patient manage on DAPT for stent x 2 at SFA/popliteal artery.  Antiplatelets held on admission secondary to sepsis and thrombocytopenia and resumed 01/27/2024 14.  New onset atrial fibrillation.  Follow-up cardiology services.  Continue Toprol -XL 50 mg daily   - Regular rate and rhythm on exam 12-9 15.  History of subdural hematoma and received CIR.  Cranial CT scan 11/28 showing no acute abnormality 16.  Hypertension.  Monitor with increased  mobility    01/30/2024    6:03 AM 01/29/2024    7:45 PM 01/29/2024    6:29 PM  Vitals with BMI  Height   5' 5  Weight   156 lbs 1 oz  BMI   25.97  Systolic 129 141   Diastolic 66 59   Pulse 86 87      17.  Recent right clavicle fracture.  Advanced to weightbearing as tolerated. 18.  Hyperlipidemia.  Lipitor 19.  Constipation.  MiraLAX  daily, Senokot S1 tablet nightly             -had BM just before I came to see him today  20.  Intractable hiccups             -dc gabapentin              -trial of low-dose baclofen  5mg  bid. Observe for effect/tolerance 12-9: Hiccups continue, remain bothersome. Start protonix  40 mg daily, monitor on baclofen  today    21. Sore throat. S/p extubation. Add cepacol lozenges PRN  LOS: 1 days A FACE TO FACE EVALUATION WAS PERFORMED  Jerry [name] 01/30/2024, 10:44 AM

## 2024-01-30 NOTE — Plan of Care (Signed)
  Problem: RH Balance Goal: LTG Patient will maintain dynamic standing with ADLs (OT) Description: LTG:  Patient will maintain dynamic standing balance with assist during activities of daily living (OT)  Flowsheets (Taken 01/30/2024 1048) LTG: Pt will maintain dynamic standing balance during ADLs with: Supervision/Verbal cueing   Problem: Sit to Stand Goal: LTG:  Patient will perform sit to stand in prep for activites of daily living with assistance level (OT) Description: LTG:  Patient will perform sit to stand in prep for activites of daily living with assistance level (OT) Flowsheets (Taken 01/30/2024 1048) LTG: PT will perform sit to stand in prep for activites of daily living with assistance level: Supervision/Verbal cueing   Problem: RH Bathing Goal: LTG Patient will bathe all body parts with assist levels (OT) Description: LTG: Patient will bathe all body parts with assist levels (OT) Flowsheets (Taken 01/30/2024 1048) LTG: Pt will perform bathing with assistance level/cueing: Supervision/Verbal cueing   Problem: RH Dressing Goal: LTG Patient will perform lower body dressing w/assist (OT) Description: LTG: Patient will perform lower body dressing with assist, with/without cues in positioning using equipment (OT) Flowsheets (Taken 01/30/2024 1048) LTG: Pt will perform lower body dressing with assistance level of: Supervision/Verbal cueing   Problem: RH Toileting Goal: LTG Patient will perform toileting task (3/3 steps) with assistance level (OT) Description: LTG: Patient will perform toileting task (3/3 steps) with assistance level (OT)  Flowsheets (Taken 01/30/2024 1048) LTG: Pt will perform toileting task (3/3 steps) with assistance level: Supervision/Verbal cueing   Problem: RH Toilet Transfers Goal: LTG Patient will perform toilet transfers w/assist (OT) Description: LTG: Patient will perform toilet transfers with assist, with/without cues using equipment (OT) Flowsheets (Taken  01/30/2024 1048) LTG: Pt will perform toilet transfers with assistance level of: Supervision/Verbal cueing   Problem: RH Tub/Shower Transfers Goal: LTG Patient will perform tub/shower transfers w/assist (OT) Description: LTG: Patient will perform tub/shower transfers with assist, with/without cues using equipment (OT) Flowsheets (Taken 01/30/2024 1048) LTG: Pt will perform tub/shower stall transfers with assistance level of: Supervision/Verbal cueing

## 2024-01-30 NOTE — Progress Notes (Signed)
 Occupational Therapy Note  Patient Details  Name: NYEEM STOKE MRN: [id number] Date of Birth: 10-Sep-1928  Occupational Therapist participated in the interdisciplinary team conference, providing clinical information regarding the patient's current status, treatment goals, and weekly focus, including any barriers that need to be addressed. Please see the Inpatient Rehabilitation Team Conference and Plan of Care Update for further details.    Nereida Habermann, OTR/L, MSOT  01/30/2024, 11:02 AM

## 2024-01-30 NOTE — Progress Notes (Signed)
 Physical Therapy Note  Patient Details  Name: Jerry [name] MRN: [id number] Date of Birth: 1929/01/06 Today's Date: 01/30/2024    Physical Therapist participated in the interdisciplinary team conference, providing clinical information regarding the patient's current status, treatment goals, and weekly focus, including any barriers that need to be addressed. Please see the Inpatient Rehabilitation Team Conference and Plan of Care Update for further details.    Reche Ohara PT, DPT 01/30/2024, 10:57 AM

## 2024-01-30 NOTE — Progress Notes (Addendum)
 Inpatient Rehabilitation Admission Medication Review by a Pharmacist  A complete drug regimen review was completed for this patient to identify any potential clinically significant medication issues.  High Risk Drug Classes Is patient taking? Indication by Medication  Antipsychotic No   Anticoagulant No   Antibiotic No   Opioid No   Antiplatelet Yes Clopidogrel , Aspirin  - PAD  Hypoglycemics/insulin No   Vasoactive Medication Yes Amiodarone , metoprolol  - Afib Tamsulosin  - BPH  Chemotherapy No   Other Yes Atorvastatin  - HLD Famotidine  - GERD Baclofen  - muscle spasms Duonebs - short of breath Melatonin - sleep MVI/thiamine  - supplement     Type of Medication Issue Identified Description of Issue Recommendation(s)  Drug Interaction(s) (clinically significant)     Duplicate Therapy     Allergy     No Medication Administration End Date     Incorrect Dose     Additional Drug Therapy Needed     Significant med changes from prior encounter (inform family/care partners about these prior to discharge).    Other       Clinically significant medication issues were identified that warrant physician communication and completion of prescribed/recommended actions by midnight of the next day:  No  Name of provider notified for urgent issues identified:   Provider Method of Notification:     Pharmacist comments:   Time spent performing this drug regimen review (minutes):  20  Thank you. Olam Monte, PharmD

## 2024-01-30 NOTE — Plan of Care (Signed)
  Problem: Consults Goal: RH GENERAL PATIENT EDUCATION Description: See Patient Education module for education specifics. Outcome: Progressing   Problem: RH BOWEL ELIMINATION Goal: RH STG MANAGE BOWEL WITH ASSISTANCE Description: STG Manage Bowel with minimal  Assistance. Outcome: Progressing   Problem: RH BLADDER ELIMINATION Goal: RH STG MANAGE BLADDER WITH ASSISTANCE Description: STG Manage Bladder With min Assistance Outcome: Progressing   Problem: RH SKIN INTEGRITY Goal: RH STG SKIN FREE OF INFECTION/BREAKDOWN Description: [place] skin free of infection with min assistance Outcome: Progressing   Problem: RH SAFETY Goal: RH STG ADHERE TO SAFETY PRECAUTIONS W/ASSISTANCE/DEVICE Description: STG Adhere to Safety Precautions With min Assistance/Device. Outcome: Progressing   Problem: RH PAIN MANAGEMENT Goal: RH STG PAIN MANAGED AT OR BELOW PT'S PAIN GOAL Description: <4 w/ prns Outcome: Progressing   Problem: RH KNOWLEDGE DEFICIT GENERAL Goal: RH STG INCREASE KNOWLEDGE OF SELF CARE AFTER HOSPITALIZATION Description: Manage increase knowledge of self care after hospitalization with min assistance from spouse using educational materials provided Outcome: Progressing

## 2024-01-30 NOTE — Plan of Care (Signed)
  Problem: RH Balance Goal: LTG Patient will maintain dynamic standing balance (PT) Description: LTG:  Patient will maintain dynamic standing balance with assistance during mobility activities (PT) Flowsheets (Taken 01/30/2024 1556) LTG: Pt will maintain dynamic standing balance during mobility activities with:: Supervision/Verbal cueing   Problem: Sit to Stand Goal: LTG:  Patient will perform sit to stand with assistance level (PT) Description: LTG:  Patient will perform sit to stand with assistance level (PT) Flowsheets (Taken 01/30/2024 1556) LTG: PT will perform sit to stand in preparation for functional mobility with assistance level: Supervision/Verbal cueing   Problem: RH Bed Mobility Goal: LTG Patient will perform bed mobility with assist (PT) Description: LTG: Patient will perform bed mobility with assistance, with/without cues (PT). Flowsheets (Taken 01/30/2024 1556) LTG: Pt will perform bed mobility with assistance level of: Independent   Problem: RH Bed to Chair Transfers Goal: LTG Patient will perform bed/chair transfers w/assist (PT) Description: LTG: Patient will perform bed to chair transfers with assistance (PT). Flowsheets (Taken 01/30/2024 1556) LTG: Pt will perform Bed to Chair Transfers with assistance level: Supervision/Verbal cueing   Problem: RH Car Transfers Goal: LTG Patient will perform car transfers with assist (PT) Description: LTG: Patient will perform car transfers with assistance (PT). Flowsheets (Taken 01/30/2024 1556) LTG: Pt will perform car transfers with assist:: Supervision/Verbal cueing   Problem: RH Ambulation Goal: LTG Patient will ambulate in controlled environment (PT) Description: LTG: Patient will ambulate in a controlled environment, # of feet with assistance (PT). Flowsheets (Taken 01/30/2024 1556) LTG: Pt will ambulate in controlled environ  assist needed:: Supervision/Verbal cueing LTG: Ambulation distance in controlled environment:  150' Goal: LTG Patient will ambulate in home environment (PT) Description: LTG: Patient will ambulate in home environment, # of feet with assistance (PT). Flowsheets (Taken 01/30/2024 1556) LTG: Pt will ambulate in home environ  assist needed:: Supervision/Verbal cueing LTG: Ambulation distance in home environment: 50'   Problem: RH Stairs Goal: LTG Patient will ambulate up and down stairs w/assist (PT) Description: LTG: Patient will ambulate up and down # of stairs with assistance (PT) Flowsheets (Taken 01/30/2024 1556) LTG: Pt will ambulate up/down stairs assist needed:: Supervision/Verbal cueing LTG: Pt will  ambulate up and down number of stairs: 7 per home set up

## 2024-01-30 NOTE — Progress Notes (Signed)
 Occupational Therapy Session Note  Patient Details  Name: Jerry [name] MRN: [id number] Date of Birth: 1928/12/27  Today's Date: 01/30/2024 OT Individual Time: 8492-8465 OT Individual Time Calculation (min): 27 min    Short Term Goals: Week 1:  OT Short Term Goal 1 (Week 1): STGs=LTGs due to patient's ELOS.  Skilled Therapeutic Interventions/Progress Updates:   Pt greeted sitting in WC, no reports of pain, family at bedside. Pt transported to day room for energy conservation. Pt completes x2 bouts of funcitonal mobility around nurses stations as a means of endurance training for carryover into ADLs/functional independence. Pt completes at close supervision level with use of RW, continued cues for safe RW proximity and RLE step height. Ambulation back to room with same level of assistance. HR stable throughout session. Pt returns to supine with Min A for BLE elevation. Pt remained resting in bed with all immediate needs met, family at bedside.   Therapy Documentation Precautions:  Precautions Precautions: Fall Recall of Precautions/Restrictions: Impaired Precaution/Restrictions Comments: HOH   Therapy/Group: Individual Therapy  Nereida Habermann, OTR/L, MSOT  01/30/2024, 3:24 PM

## 2024-01-30 NOTE — Evaluation (Addendum)
 Physical Therapy Assessment and Plan  Patient Details  Name: Jerry [name] MRN: [id number] Date of Birth: 10-05-1928  PT Diagnosis: Abnormality of gait, Impaired sensation, and Muscle weakness Rehab Potential: Good ELOS: 7-10 days   Today's Date: 01/30/2024 PT Individual Time: 8894-8794 + 1350-1432 PT Individual Time Calculation (min): 60 min  + 42 min  Hospital Problem: Principal Problem:   Sepsis (HCC)   Past Medical History:  Past Medical History:  Diagnosis Date   AAA (abdominal aortic aneurysm)    Aneurysm    History of multiple vascular aneurysms. [name] involve the aorta, te iliac arteries, and the popliteal arteries.  The pt is s/p stent graft of an abdominal aortic aneurysm. All of his aneurysm follow up as been done at [facility].   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 This is manifested and is elevated, sed rate, pleuritis, Raynaud's pehnomenon and she does have a positive rheumatoid factor.  He is being treated with Plquenil for his collagen vascular disease   Colonic polyp    History of nephrolithiasis    Hyperlipidemia    Hypertension    Macular degeneration    Osteoarthrosis, hip    Left hip   Paget's disease    Past Surgical History:  Past Surgical History:  Procedure Laterality Date   ABDOMINAL AORTIC ANEURYSM REPAIR     CYSTOSCOPY W/ URETERAL STENT PLACEMENT Bilateral 01/19/2024   Procedure: CYSTOSCOPY, WITH RETROGRADE PYELOGRAM AND URETERAL STENT INSERTION;  Surgeon: Watt Rush, MD;  Location: [facility] OR;  Service: Urology;  Laterality: Bilateral;  BILATERAL PYELOGRAM, POSSIBLE RIGHT URETERAL STENT PLACEMENT   DOPPLER ECHOCARDIOGRAPHY  2006   Ef greater than 60%. There are no regional wall motion abnormalities. There was mild mitral regurgitation, there is no aortic stenosis, aortic valve was mildly calcified   HERNIA REPAIR  1998   LOWER EXTREMITY ANGIOGRAPHY Left 01/30/2023   Procedure: Lower Extremity Angiography;  Surgeon:  Marea Selinda RAMAN, MD;  Location: [facility] INVASIVE CV LAB;  Service: Cardiovascular;  Laterality: Left;   Lung Hamartoma      Assessment & Plan Clinical Impression: Patient is a 89 year old right-handed male with history significant for AAA, anemia of chronic disease hearing loss, hypertension, hyperlipidemia, macular degeneration, popliteal aneurysm status post stenting by vascular surgery 2024 maintained on aspirin  and Plavix  per Dr. Selinda Marea, traumatic subdural hematoma after a fall as well as nondisplaced right clavicle fracture and received CIR 12/05/2023 - 12/19/2023 and was discharged to home with family requiring supervision for bed mobility and ambulates household distances with a rollator and arrangements were made for home health therapies. Per chart review patient lives with spouse. Two-level home bed and bath main level with 3 steps to from entry of home. Wife does assist with some basic ADLs. Presented 01/19/2024 after being found down after hearing a loud noise in the bathroom patient noted to have altered mental status. In the ED patient was tachycardic as well as hypotensive with fever of 103 cranial CT scan showed no acute intracranial abnormality. No skull fracture. No mass effect or midline shift. CT cervical spine negative. CT of the chest abdomen and pelvis showed 2 mm calculus in the mid left ureter with mild obstructive uropathy. Multiple healing/healed bilateral rib fractures. There was question acute nondisplaced anterior right second rib fracture. Age-indeterminate posterior left 10th rib fracture. Stable abdominal aortic aneurysm measuring 4 cm. 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 Admission chemistries unremarkable except CO2  16 glucose 120, creatinine 1.75, AST 54, WBC 14,900, hemoglobin 12.5, platelets 103,000, blood cultures E. coli, BNP 292.3, lactic acid 5.1-7.0, troponin elevated 147-125. Patient was placed on broad-spectrum antibiotic with  vancomycin  and Zosyn  for sepsis as well as receiving 2 L LR boluses, DuoNeb and Solu-Medrol . Urology Dr. Norleen Seltzer consulted in regards to bilateral ureteral stones with left obstruction undergoing cystoscopy with bilateral retrograde pyelography and bilateral ureteral stents 01/20/2024. Hospital course septic shock secondary to acute pyelonephritis and obstructive uropathy with evidence of E. coli bacteremia. Patient initially required ICU admission was started on dobutamine , vasopressin , phenylephrine  and norepinephrine . Vasopressors weaned off with treatment of infection. His empiric vancomycin  and Zosyn  transition to ceftriaxone  with leukocytosis initially peaked to 43,600 improved to 26,600-21,500. His Foley tube remains in place mild hematuria and monitored with hemoglobin stable 11.5. New onset HFrEF question etiology felt troponin positive related to demand ischemia/sepsis. Transthoracic echocardiogram significant for LVEF of 35 to 40% with global hypokinesis. Patient did receive diuresis. Lactic acidosis resolved with management of sepsis. AKI complicated by mild hydronephrosis from ureteral obstruction improved with latest creatinine 0.89. Incidental findings of right upper extremity SVT diagnosed 12/3 with supportive care. Patient developed new onset atrial fibrillation 01/25/2024 with cardiology services follow-up he was placed on Toprol -XL 25 mg daily. No anticoagulation at this time contraindicated due to recent subdural bleed. In regards to patient's history of PAD managed on DAPT for stent x 2 SFA/popliteal artery antiplatelets held initially secondary to sepsis and thrombocytopenia with latest platelet count 175,000 and Plavix  and aspirin  have been resumed as of 01/27/2024. Pressure injury of right/mid/left sacrum with wound care as directed. He is tolerating a regular consistency diet. Therapy evaluations completed due to patient's decreased functional ability/septic shock was admitted for a  comprehensive rehab program.   Patient currently requires min with mobility secondary to muscle weakness, decreased cardiorespiratoy endurance, and decreased standing balance, decreased postural control, and decreased balance strategies.  Prior to hospitalization, patient was supervision with mobility and lived with Spouse in a House home.  Home access is 6-7Stairs to enter.  Patient will benefit from skilled PT intervention to maximize safe functional mobility, minimize fall risk, and decrease caregiver burden for planned discharge home with 24 hour supervision.  Anticipate patient will benefit from follow up OP at discharge.  PT - End of Session Activity Tolerance: Tolerates 30+ min activity with multiple rests Endurance Deficit: Yes Endurance Deficit Description: rest breaks with all mobility tasks PT Assessment Rehab Potential (ACUTE/IP ONLY): Good PT Barriers to Discharge: Inaccessible home environment;Decreased caregiver support;Home environment access/layout PT Barriers to Discharge Comments: lives with wife who cannot provide physical assistance, 6-7 STE with BHRs PT Patient demonstrates impairments in the following area(s): Balance;Endurance;Pain;Safety;Motor;Sensory PT Transfers Functional Problem(s): Bed Mobility;Bed to Chair;Car PT Locomotion Functional Problem(s): Ambulation;Stairs PT Plan PT Intensity: Minimum of 1-2 x/day ,45 to 90 minutes PT Frequency: 5 out of 7 days PT Duration Estimated Length of Stay: 7-10 days PT Treatment/Interventions: Ambulation/gait training;Community reintegration;DME/adaptive equipment instruction;Neuromuscular re-education;Psychosocial support;Stair training;UE/LE Strength taining/ROM;Balance/vestibular training;Discharge planning;Functional electrical stimulation;Pain management;Skin care/wound management;Therapeutic Activities;UE/LE Coordination activities;Cognitive remediation/compensation;Disease management/prevention;Functional mobility  training;Patient/family education;Splinting/orthotics;Therapeutic Exercise;Visual/perceptual remediation/compensation PT Transfers Anticipated Outcome(s): supervision PT Locomotion Anticipated Outcome(s): supervision ambulatory PT Recommendation Recommendations for Other Services: None Follow Up Recommendations: Outpatient PT Patient destination: Home Equipment Recommended: To be determined   PT Evaluation Precautions/Restrictions Precautions Precautions: Fall Recall of Precautions/Restrictions: Impaired Precaution/Restrictions Comments: HOH Pain Interference Pain Interference Pain Effect on Sleep: 1. Rarely or not at all Pain Interference with Therapy  Activities: 1. Rarely or not at all Pain Interference with Day-to-Day Activities: 1. Rarely or not at all Home Living/Prior Functioning Home Living Living Arrangements: Spouse/significant other Available Help at Discharge: Family;Available 24 hours/day Type of Home: House Home Access: Stairs to enter [employer] of Steps: 6-7 Entrance Stairs-Rails: Right;Left;Can reach both Home Layout: Two level;Able to live on main level with bedroom/bathroom Bathroom Shower/Tub: Walk-in shower;Door (x1 grab bar no DME) Bathroom Toilet: Standard Bathroom Accessibility: Yes  Lives With: Spouse Prior Function Level of Independence: Independent with basic ADLs;Independent with homemaking with ambulation;Independent with gait;Independent with transfers  Able to Take Stairs?: Yes Driving: Yes Vocation: Retired Vision/Perception  Vision - History Ability to See in Adequate Light: 1 Impaired Perception Perception: Within Functional Limits Praxis Praxis: WFL  Cognition Overall Cognitive Status: Within Functional Limits for tasks assessed Arousal/Alertness: Awake/alert Orientation Level: Oriented to person;Oriented to place;Oriented to time Safety/Judgment: Appears intact Sensation Sensation Light Touch: Impaired  Detail Peripheral sensation comments: Reports mild numbness in R-foot Light Touch Impaired Details: Impaired RLE Hot/Cold: Appears Intact Coordination Gross Motor Movements are Fluid and Coordinated: No Fine Motor Movements are Fluid and Coordinated: Yes Coordination and Movement Description: Deficits due to generalized weakness/debility and decreased balance strategies. Motor  Motor Motor: Other (comment) Motor - Skilled Clinical Observations: Generalized weakness/debility  Trunk/Postural Assessment  Cervical Assessment Cervical Assessment: Exceptions to [facility] (forward head) Thoracic Assessment Thoracic Assessment: Exceptions to [facility] (rounded shoulders) Lumbar Assessment Lumbar Assessment: Exceptions to [facility] (posterior pelvic tilt) Postural Control Postural Control: Deficits on evaluation Righting Reactions: Decreased/delayed Protective Responses: Decreased/delayed  Balance Balance Balance Assessed: Yes Standardized Balance Assessment Standardized Balance Assessment: Timed Up and Go Test Timed Up and Go Test TUG: Normal TUG Normal TUG (seconds): 32.87 Static Sitting Balance Static Sitting - Balance Support: Feet supported;Bilateral upper extremity supported Static Sitting - Level of Assistance: 5: Stand by assistance Dynamic Sitting Balance Dynamic Sitting - Balance Support: During functional activity Dynamic Sitting - Level of Assistance: 5: Stand by assistance Static Standing Balance Static Standing - Balance Support: During functional activity;Bilateral upper extremity supported Static Standing - Level of Assistance: 5: Stand by assistance Dynamic Standing Balance Dynamic Standing - Balance Support: Bilateral upper extremity supported;During functional activity Dynamic Standing - Level of Assistance: Other (comment) (CGA) Extremity Assessment  RUE Assessment Active Range of Motion (AROM) Comments: WFL General Strength Comments: 3-/5 LUE Assessment LUE Assessment:  Within Functional Limits RLE Assessment RLE Assessment: Exceptions to [facility] General Strength Comments: Grossly 4/5, endurance impaired LLE Assessment LLE Assessment: Exceptions to [facility] General Strength Comments: grossly 4/5, endurance impaired  Care Tool Care Tool Bed Mobility Roll left and right activity   Roll left and right assist level: Supervision/Verbal cueing    Sit to lying activity   Sit to lying assist level: Minimal Assistance - Patient > 75%    Lying to sitting on side of bed activity   Lying to sitting on side of bed assist level: the ability to move from lying on the back to sitting on the side of the bed with no back support.: Minimal Assistance - Patient > 75%     Care Tool Transfers Sit to stand transfer   Sit to stand assist level: Contact Guard/Touching assist    Chair/bed transfer   Chair/bed transfer assist level: Contact Guard/Touching assist    Car transfer   Car transfer assist level: Contact Guard/Touching assist      Care Tool Locomotion Ambulation   Assist level: Contact Guard/Touching assist Assistive device: Walker-rolling Max  distance: 180'  Walk 10 feet activity   Assist level: Contact Guard/Touching assist Assistive device: Walker-rolling   Walk 50 feet with 2 turns activity   Assist level: Contact Guard/Touching assist Assistive device: Walker-rolling  Walk 150 feet activity   Assist level: Contact Guard/Touching assist Assistive device: Walker-rolling  Walk 10 feet on uneven surfaces activity   Assist level: Minimal Assistance - Patient > 75% Assistive device: Other (comment) (no device)  Stairs   Assist level: Contact Guard/Touching assist Stairs assistive device: 2 hand rails Max number of stairs: 12  Walk up/down 1 step activity   Walk up/down 1 step (curb) assist level: Contact Guard/Touching assist Walk up/down 1 step or curb assistive device: 2 hand rails  Walk up/down 4 steps activity   Walk up/down 4 steps assist level:  Contact Guard/Touching assist Walk up/down 4 steps assistive device: 2 hand rails  Walk up/down 12 steps activity   Walk up/down 12 steps assist level: Contact Guard/Touching assist Walk up/down 12 steps assistive device: 2 hand rails  Pick up small objects from floor Pick up small object from the floor (from standing position) activity did not occur: Safety/medical concerns      Wheelchair Is the patient using a wheelchair?: Yes Type of Wheelchair: Manual   Wheelchair assist level: Dependent - Patient 0%    Wheel 50 feet with 2 turns activity   Assist Level: Dependent - Patient 0%  Wheel 150 feet activity   Assist Level: Dependent - Patient 0%    Refer to Care Plan for Long Term Goals  SHORT TERM GOAL WEEK 1 PT Short Term Goal 1 (Week 1): STG = LTG due to ELOS  Recommendations for other services: None   Skilled Therapeutic Intervention SESSION 1: Evaluation completed (see details above and below) with education on PT POC and goals and individual treatment initiated with focus on gait training with and without RW, stair training, and therapeutic activities to facilitate upright tolerance. Pt denies pain aside from sore throat, reported feeling this since his surgery however DO updated during session. Pt completes transfers with CGA with RW, requires cues for hand placement. Pt completes gait 180' with RW with CGA, cues for proximity to RW and RLE step length as pt demonstrating decreased step length and foot clearance with RLE. Pt ambulates 100' without device with CGA/min assist, demonstrating increased forward trunk lean, decreased stride length, no arm swing or trunk rotation noted. Pt completes up/down 12 steps with BHRs CGA. Pt completes car transfer with CGA without device, cues for sequencing. Pt completes gait up/down ramp without device with min assist. Pt returns to room and completes bed mobility with supervision increased time, requires cues for positioning in center of bed,  positioned with HOB upright for pt to each lunch with all needs within reach, call light in place, bed alarm activated and pt wife at bedside at end of session.  SESSION 2: Pt presents in room, in bed, agreeable to PT. Pt reporting that air mattress is causing back pain, therapist providing education on positional changes as well as importance of air mattress for wound healing with pt verbalizing understanding. Session focused on therapeutic activities for activity tolerance and NMR for dynamic standing balance/BLE muscle fiber recruitment. Pt completes bed mobility with supervision with increased time due to fatigue. Pt completes stand step transfer without device throughout session with min assist. Pt transported via WC to ortho gym for energy conservation. Pt completes continuous training BUE/BLE x10 min on nustep L3 resistance  to promote improved activity tolerance and global muscular strengthening needed for ambulation and transfers. Pt then completes standing therex as NMR for BLE muscle fiber recruitment and dynamic standing balance including: - sit to stands x10 - standing marches x20 alternating BLE - heel raise x10 - mini squats x10 Pt returns to room and remains seated in [facility] with all needs within reach, cal light in place and chair alarm donned and activated at end of session.    Mobility Bed Mobility Bed Mobility: Supine to Sit;Sit to Supine Supine to Sit: Minimal Assistance - Patient > 75% Sit to Supine: Minimal Assistance - Patient > 75% Transfers Transfers: Sit to Stand;Stand to Sit;Stand Pivot Transfers Sit to Stand: Contact Guard/Touching assist Stand to Sit: Contact Guard/Touching assist Stand Pivot Transfers: Contact Guard/Touching assist Stand Pivot Transfer Details: Verbal cues for safe use of DME/AE;Verbal cues for precautions/safety;Verbal cues for technique Transfer (Assistive device): Rolling walker Locomotion  Gait Ambulation: Yes Gait Assistance: Contact  Guard/Touching assist Gait Distance (Feet): 180 Feet Assistive device: Rolling walker Gait Assistance Details: Verbal cues for gait pattern;Verbal cues for precautions/safety;Verbal cues for technique;Tactile cues for posture;Tactile cues for weight shifting;Tactile cues for sequencing Gait Gait: Yes Gait Pattern: Impaired Gait Pattern: Trunk flexed;Decreased stride length Gait velocity: reduced Stairs / Additional Locomotion Stairs: Yes Stairs Assistance: Contact Guard/Touching assist Stair Management Technique: Two rails Number of Stairs: 12 Height of Stairs: 6 Ramp: Minimal Assistance - Patient >75% Wheelchair Mobility Wheelchair Mobility: No   Discharge Criteria: Patient will be discharged from PT if patient refuses treatment 3 consecutive times without medical reason, if treatment goals not met, if there is a change in medical status, if patient makes no progress towards goals or if patient is discharged from hospital.  The above assessment, treatment plan, treatment alternatives and goals were discussed and mutually agreed upon: by patient and by family  Reche Ohara PT, DPT 01/30/2024, 12:55 PM

## 2024-01-30 NOTE — Patient Care Conference (Signed)
 Inpatient RehabilitationTeam Conference and Plan of Care Update Date: 01/30/2024   Time: 1043 am    Patient Name: Jerry [name]      Medical Record Number: [id number]  Date of Birth: Nov 26, 1928 Sex: Male         Room/Bed: 4W25C/4W25C-01 Payor Info: Payor: MEDICARE / Plan: MEDICARE PART A AND B / Product Type: *No Product type* /    Admit Date/Time:  01/29/2024  5:39 PM  Primary Diagnosis:  Sepsis [facility])  Hospital Problems: Principal Problem:   Sepsis [facility])    Expected Discharge Date: Expected Discharge Date: 02/08/24  Team Members Present: Physician leading conference: Dr. Joesph Likes Social Worker Present: Graeme Jude, LCSW Nurse Present: Eulalio Falls, RN PT Present: Catilin Osborn, PT OT Present: Nereida Habermann, OT SLP Present: Recardo Mole, SLP     Current Status/Progress Goal Weekly Team Focus  Bowel/Bladder   Patient is continent with bowel and has Foley Caheter. LBM 12/06   Will free from CAUTI   Foley care qshift and after each BM and provide education to prevent CAUTI    Swallow/Nutrition/ Hydration               ADL's   Min A for BADLs            Mobility   eval pending           Communication                Safety/Cognition/ Behavioral Observations               Pain   Denies pain at this time   Will be free from pain   Assess pain qshift/prn    Skin   Pressure injury to sacrum (stage 1&3) Foam dressing in place   Will maintain skin intergrity with no breakdown  Assess skin for breakdown qshift.prn and promote healing      Discharge Planning:  TBA   Team Discussion: *** Patient on target to meet rehab goals: {IP REHAB YES/NO WITH TPOIRJMID:[id number]}  *See Care Plan and progress notes for long and short-term goals.   Revisions to Treatment Plan:  ***  Teaching Needs: ***  Current Barriers to Discharge: {BARRIERS TO [id number]:[id number]}  Possible Resolutions to Barriers: ***     Medical Summary Current  Status: Medically complicated by back pain, sacral wounds, bilateral ureteral stents and Foley catheter, weakness, cognitive deficits  Barriers to Discharge: Cardiac Complications;Medical stability;Self-care education;Uncontrolled Pain;Behavior/Mood;Pending surgery/plan   Possible Resolutions to [name]: Moderate pain tolerance with range of motion, treat sacral wounds, frequent reorientation, monitoring of Foley/urinary output for obstruction   Continued Need for Acute Rehabilitation Level of Care: The patient requires daily medical management by a physician with specialized training in physical medicine and rehabilitation for the following reasons: Direction of a multidisciplinary physical rehabilitation program to maximize functional independence : Yes Medical management of patient stability for increased activity during participation in an intensive rehabilitation regime.: Yes Analysis of laboratory values and/or radiology reports with any subsequent need for medication adjustment and/or medical intervention. : Yes   I attest that I was present, lead the team conference, and concur with the assessment and plan of the team.   Kalisha Keadle Gayo 01/30/2024, 1043 am

## 2024-01-30 NOTE — Progress Notes (Signed)
 Inpatient Rehabilitation Care Coordinator Assessment and Plan Patient Details  Name: Jerry [name] MRN: [id number] Date of Birth: 1928-07-03  Today's Date: 01/30/2024  Hospital Problems: Principal Problem:   Sepsis [facility])  Past Medical History:  Past Medical History:  Diagnosis Date   AAA (abdominal aortic aneurysm)    Aneurysm    History of multiple vascular aneurysms. [name] involve the aorta, te iliac arteries, and the popliteal arteries.  The pt is s/p stent graft of an abdominal aortic aneurysm. All of his aneurysm follow up as been done at [facility].   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 This is manifested and is elevated, sed rate, pleuritis, Raynaud's pehnomenon and she does have a positive rheumatoid factor.  He is being treated with Plquenil for his collagen vascular disease   Colonic polyp    History of nephrolithiasis    Hyperlipidemia    Hypertension    Macular degeneration    Osteoarthrosis, hip    Left hip   Paget's disease    Past Surgical History:  Past Surgical History:  Procedure Laterality Date   ABDOMINAL AORTIC ANEURYSM REPAIR     CYSTOSCOPY W/ URETERAL STENT PLACEMENT Bilateral 01/19/2024   Procedure: CYSTOSCOPY, WITH RETROGRADE PYELOGRAM AND URETERAL STENT INSERTION;  Surgeon: Watt Rush, MD;  Location: [facility] OR;  Service: Urology;  Laterality: Bilateral;  BILATERAL PYELOGRAM, POSSIBLE RIGHT URETERAL STENT PLACEMENT   DOPPLER ECHOCARDIOGRAPHY  2006   Ef greater than 60%. There are no regional wall motion abnormalities. There was mild mitral regurgitation, there is no aortic stenosis, aortic valve was mildly calcified   HERNIA REPAIR  1998   LOWER EXTREMITY ANGIOGRAPHY Left 01/30/2023   Procedure: Lower Extremity Angiography;  Surgeon: Marea Selinda RAMAN, MD;  Location: [facility] INVASIVE CV LAB;  Service: Cardiovascular;  Laterality: Left;   Lung Hamartoma     Social History:  reports that he has quit smoking. He has never used  smokeless tobacco. He reports current alcohol use. No history on file for drug use.  Family / Support Systems Marital Status: Married How Long?: 68 years Patient Roles: Spouse, Parent Spouse/Significant Other: [name] (wife) [phone] Children: 3 children- [name] (lives in [place] but will be here through next week), [name] (livesa in [place]), and [name] (lives in [place]) Other Supports: none reported Anticipated Caregiver: wife Ability/Limitations of Caregiver: wife is primary caregiver. Pt will need to be as independent as possible. Pt dtr [name] up here from [facility] will be here but unable to stay here 24/7. Caregiver Availability: 24/7 Family Dynamics: Pt lives with his wife.   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 Education: college Dentist - How often do you need to have someone help you when you read instructions, pamphlets, or other written material from your doctor or pharmacy?: Never Writes: Yes Employment Status: Retired Date Retired/Disabled/Unemployed: 1997 Marine Scientist Issues: Denies Guardian/Conservator: Building Surveyor (dtr) [phone]    Abuse/Neglect Abuse/Neglect Assessment Can Be Completed: Yes Physical Abuse: Denies Verbal Abuse: Denies Sexual Abuse: Denies Exploitation of patient/patient's resources: Denies Self-Neglect: Denies   Patient response to: Social Isolation - How often do you feel lonely or isolated from those around you?: Never   Emotional Status Pt's affect, behavior and adjustment status: Pt in good spirirs at time of visit Recent Psychosocial Issues: Denies Psychiatric History: Denies Substance Abuse History: Denies   Patient / Family Perceptions, Expectations &  Goals Pt/Family understanding of illness & functional limitations: Pt and family have a general understanding of care  needs Premorbid pt/family  roles/activities: Independent Anticipated changes in roles/activities/participation: Assistance with ADLs/IADLs Pt/family expectations/goals: Pt would like to work on walking and being able to get up and go to the bathroom on his own.   Community Resources [name]: None Premorbid Home Care/DME Agencies: None Transportation available at discharge: TBD Is the patient able to respond to transportation needs?: Yes In the past 12 months, has lack of transportation kept you from medical appointments or from getting medications?: No In the past 12 months, has lack of transportation kept you from meetings, work, or from getting things needed for daily living?: No Resource referrals recommended: Neuropsychology   Discharge Planning Living Arrangements: Spouse/significant other Support Systems: Spouse/significant other, Children Type of Residence: Private residence Insurance Resources: [employer] Financial Resources: Social Security Financial Screen Referred: No Living Expenses: Own Money Management: Spouse Does the patient have any problems obtaining your medications?: No Home Management: Pt prepared all meals at the home. He did some light cleaning in his room and vacuumed at times as well. Patient/Family Preliminary Plans: TBD Care Coordinator Barriers to Discharge: Decreased caregiver support, Lack of/limited family support Care Coordinator Anticipated Follow Up Needs: HH/OP   Clinical Impression Pt is a return patient and SW familiar with patient. DME 3in1 BSC and rollator.   SW met with pt and pt wife in room to provide updates from team conference,and d/c date 12/18. Reports surgery on 12/17 at [facility]- Mohs surgery. SW will discuss with medical team ad follow-up.   [name] 01/30/2024, 11:48 AM

## 2024-01-30 NOTE — Evaluation (Signed)
 Occupational Therapy Assessment and Plan  Patient Details  Name: Jerry [name] MRN: [id number] Date of Birth: November 16, 1928  OT Diagnosis: abnormal posture, muscle weakness (generalized), decreased activity tolerance, decreased balance strategies Rehab Potential: Rehab Potential (ACUTE ONLY): Good ELOS: 7-10 days   Today's Date: 01/30/2024 OT Individual Time: 0920-1030 OT Individual Time Calculation (min): 70 min     Hospital Problem: Principal Problem:   Sepsis (HCC)   Past Medical History:  Past Medical History:  Diagnosis Date   AAA (abdominal aortic aneurysm)    Aneurysm    History of multiple vascular aneurysms. [name] involve the aorta, te iliac arteries, and the popliteal arteries.  The pt is s/p stent graft of an abdominal aortic aneurysm. All of his aneurysm follow up as been done at [facility].   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 This is manifested and is elevated, sed rate, pleuritis, Raynaud's pehnomenon and she does have a positive rheumatoid factor.  He is being treated with Plquenil for his collagen vascular disease   Colonic polyp    History of nephrolithiasis    Hyperlipidemia    Hypertension    Macular degeneration    Osteoarthrosis, hip    Left hip   Paget's disease    Past Surgical History:  Past Surgical History:  Procedure Laterality Date   ABDOMINAL AORTIC ANEURYSM REPAIR     CYSTOSCOPY W/ URETERAL STENT PLACEMENT Bilateral 01/19/2024   Procedure: CYSTOSCOPY, WITH RETROGRADE PYELOGRAM AND URETERAL STENT INSERTION;  Surgeon: Watt Rush, MD;  Location: [facility] OR;  Service: Urology;  Laterality: Bilateral;  BILATERAL PYELOGRAM, POSSIBLE RIGHT URETERAL STENT PLACEMENT   DOPPLER ECHOCARDIOGRAPHY  2006   Ef greater than 60%. There are no regional wall motion abnormalities. There was mild mitral regurgitation, there is no aortic stenosis, aortic valve was mildly calcified   HERNIA REPAIR  1998   LOWER EXTREMITY ANGIOGRAPHY Left  01/30/2023   Procedure: Lower Extremity Angiography;  Surgeon: Marea Selinda RAMAN, MD;  Location: [facility] INVASIVE CV LAB;  Service: Cardiovascular;  Laterality: Left;   Lung Hamartoma      Assessment & Plan Clinical Impression: Patient is a 89 year old right-handed male with history significant for AAA, anemia of chronic disease hearing loss, hypertension, hyperlipidemia, macular degeneration, popliteal aneurysm status post stenting by vascular surgery 2024 maintained on aspirin  and Plavix  per Dr. Selinda Marea, traumatic subdural hematoma after a fall as well as nondisplaced right clavicle fracture and received CIR 12/05/2023 - 12/19/2023 and was discharged to home with family requiring supervision for bed mobility and ambulates household distances with a rollator and arrangements were made for home health therapies. Per chart review patient lives with spouse. Two-level home bed and bath main level with 3 steps to from entry of home. Wife does assist with some basic ADLs. Presented 01/19/2024 after being found down after hearing a loud noise in the bathroom patient noted to have altered mental status. In the ED patient was tachycardic as well as hypotensive with fever of 103 cranial CT scan showed no acute intracranial abnormality. No skull fracture. No mass effect or midline shift. CT cervical spine negative. CT of the chest abdomen and pelvis showed 2 mm calculus in the mid left ureter with mild obstructive uropathy. Multiple healing/healed bilateral rib fractures. There was question acute nondisplaced anterior right second rib fracture. Age-indeterminate posterior left 10th rib fracture. Stable abdominal aortic aneurysm measuring 4 cm. 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 Admission  chemistries unremarkable except CO2 16 glucose 120, creatinine 1.75, AST 54, WBC 14,900, hemoglobin 12.5, platelets 103,000, blood cultures E. coli, BNP 292.3, lactic acid 5.1-7.0, troponin elevated  147-125. Patient was placed on broad-spectrum antibiotic with vancomycin  and Zosyn  for sepsis as well as receiving 2 L LR boluses, DuoNeb and Solu-Medrol . Urology Dr. Norleen Seltzer consulted in regards to bilateral ureteral stones with left obstruction undergoing cystoscopy with bilateral retrograde pyelography and bilateral ureteral stents 01/20/2024. Hospital course septic shock secondary to acute pyelonephritis and obstructive uropathy with evidence of E. coli bacteremia. Patient initially required ICU admission was started on dobutamine , vasopressin , phenylephrine  and norepinephrine . Vasopressors weaned off with treatment of infection. His empiric vancomycin  and Zosyn  transition to ceftriaxone  with leukocytosis initially peaked to 43,600 improved to 26,600-21,500. His Foley tube remains in place mild hematuria and monitored with hemoglobin stable 11.5. New onset HFrEF question etiology felt troponin positive related to demand ischemia/sepsis. Transthoracic echocardiogram significant for LVEF of 35 to 40% with global hypokinesis. Patient did receive diuresis. Lactic acidosis resolved with management of sepsis. AKI complicated by mild hydronephrosis from ureteral obstruction improved with latest creatinine 0.89. Incidental findings of right upper extremity SVT diagnosed 12/3 with supportive care. Patient developed new onset atrial fibrillation 01/25/2024 with cardiology services follow-up he was placed on Toprol -XL 25 mg daily. No anticoagulation at this time contraindicated due to recent subdural bleed. In regards to patient's history of PAD managed on DAPT for stent x 2 SFA/popliteal artery antiplatelets held initially secondary to sepsis and thrombocytopenia with latest platelet count 175,000 and Plavix  and aspirin  have been resumed as of 01/27/2024. Pressure injury of right/mid/left sacrum with wound care as directed. He is tolerating a regular consistency diet. Patient transferred to CIR on 01/29/2024 .     Patient currently requires min with basic self-care skills secondary to muscle weakness, decreased cardiorespiratoy endurance, and decreased standing balance and decreased balance strategies.  Prior to hospitalization, patient could complete BADLs with SUP-Min A.  Patient will benefit from skilled intervention to increase independence with basic self-care skills prior to discharge home with care partner.  Anticipate patient will require 24 hour supervision and follow up home health.  OT - End of Session Activity Tolerance: Tolerates 10 - 20 min activity with multiple rests Endurance Deficit: Yes OT Assessment Rehab Potential (ACUTE ONLY): Good OT Barriers to Discharge: Home environment access/layout OT Patient demonstrates impairments in the following area(s): Balance;Endurance;Pain;Safety OT Basic ADL's Functional Problem(s): Bathing;Dressing;Toileting OT Transfers Functional Problem(s): Toilet;Tub/Shower OT Plan OT Intensity: Minimum of 1-2 x/day, 45 to 90 minutes OT Frequency: 5 out of 7 days OT Duration/Estimated Length of Stay: 7-10 days OT Treatment/Interventions: Balance/vestibular training;Community reintegration;Discharge planning;Disease mangement/prevention;DME/adaptive equipment instruction;Functional mobility training;Neuromuscular re-education;Pain management;Patient/family education;Psychosocial support;Self Care/advanced ADL retraining;Skin care/wound managment;Therapeutic Activities;Therapeutic Exercise;UE/LE Strength taining/ROM;Wheelchair propulsion/positioning;Cognitive remediation/compensation OT Basic Self-Care Anticipated Outcome(s): Supervision OT Toileting Anticipated Outcome(s): Supervision OT Bathroom Transfers Anticipated Outcome(s): Supervision OT Recommendation Recommendations for Other Services: Therapeutic Recreation consult Therapeutic Recreation Interventions: Stress management Patient destination: Home Follow Up Recommendations: Home health  OT Equipment Recommended: To be determined   OT Evaluation Precautions/Restrictions  Precautions Precautions: Fall Recall of Precautions/Restrictions: Impaired Precaution/Restrictions Comments: HOH General Chart Reviewed: Yes Family/Caregiver Present: Yes (Spouse and adult children) Pain Pain Assessment Pain Scale: 0-10 Pain Score: 0-No pain Home Living/Prior Functioning Home Living Family/patient expects to be discharged to:: Private residence Living Arrangements: Spouse/significant other Vision Baseline Vision/History: 1 Wears glasses Ability to See in Adequate Light: 1 Impaired Patient Visual Report: No change from baseline Vision Assessment?:  Wears glasses for reading;Wears glasses for driving Perception  Perception: Within Functional Limits Praxis Praxis: WFL Cognition Cognition Overall Cognitive Status: Within Functional Limits for tasks assessed Arousal/Alertness: Awake/alert Safety/Judgment: Appears intact Brief Interview for Mental Status (BIMS) Repetition of Three Words (First Attempt): 3 Temporal Orientation: Year: Correct Temporal Orientation: Month: Accurate within 5 days Temporal Orientation: Day: Incorrect Recall: Sock: Yes, no cue required Recall: Blue: Yes, after cueing (a color) Recall: Bed: Yes, no cue required BIMS Summary Score: 13 Sensation Sensation Light Touch: Impaired Detail Peripheral sensation comments: Reports mild numbness in R-foot Light Touch Impaired Details: Impaired RLE Hot/Cold: Appears Intact Coordination Gross Motor Movements are Fluid and Coordinated: No Fine Motor Movements are Fluid and Coordinated: Yes Coordination and Movement Description: Deficits due to generalized weakness/debility and decreased balance strategies. Motor  Motor Motor: Other (comment) Motor - Skilled Clinical Observations: Generalized weakness/debility  Trunk/Postural Assessment  Cervical Assessment Cervical Assessment: Exceptions to [facility]  (forward head) Thoracic Assessment Thoracic Assessment: Exceptions to [facility] (rounded shoulders) Lumbar Assessment Lumbar Assessment: Exceptions to [facility] (posterior pelvic tilt) Postural Control Postural Control: Deficits on evaluation Righting Reactions: Decreased/delayed Protective Responses: Decreased/delayed  Balance Balance Balance Assessed: Yes Static Sitting Balance Static Sitting - Balance Support: Feet supported;Bilateral upper extremity supported Static Sitting - Level of Assistance: 6: Modified independent (Device/Increase time) Dynamic Sitting Balance Dynamic Sitting - Balance Support: During functional activity Dynamic Sitting - Level of Assistance: 5: Stand by assistance (SUP) Static Standing Balance Static Standing - Balance Support: During functional activity Static Standing - Level of Assistance: 5: Stand by assistance (SUP) Dynamic Standing Balance Dynamic Standing - Balance Support: Bilateral upper extremity supported;During functional activity Dynamic Standing - Level of Assistance: 5: Stand by assistance (CGA) Extremity/Trunk Assessment RUE Assessment Active Range of Motion (AROM) Comments: WFL General Strength Comments: 3-/5 LUE Assessment LUE Assessment: Within Functional Limits  Care Tool Care Tool Self Care Eating   Eating Assist Level: Set up assist    Oral Care    Oral Care Assist Level: Set up assist    Bathing   Body parts bathed by patient: Right arm;Left arm;Chest;Abdomen;Front perineal area;Right upper leg;Left upper leg;Face;Buttocks Body parts bathed by helper: Right lower leg;Left lower leg   Assist Level: Minimal Assistance - Patient > 75%    Upper Body Dressing(including orthotics)   What is the patient wearing?: Pull over shirt   Assist Level: Set up assist    Lower Body Dressing (excluding footwear)   What is the patient wearing?: Pants;Underwear/pull up Assist for lower body dressing: Minimal Assistance - Patient > 75%     Putting on/Taking off footwear   What is the patient wearing?: Non-skid slipper socks Assist for footwear: Total Assistance - Patient < 25%       Care Tool Toileting Toileting activity   Assist for toileting: Contact Guard/Touching assist     Care Tool Bed Mobility Roll left and right activity        Sit to lying activity        Lying to sitting on side of bed activity         Care Tool Transfers Sit to stand transfer        Chair/bed transfer         Toilet transfer   Assist Level: Contact Guard/Touching assist     Care Tool Cognition  Expression of Ideas and Wants Expression of Ideas and Wants: 4. Without difficulty (complex and basic) - expresses complex messages without difficulty and with speech that is  clear and easy to understand  Understanding Verbal and Non-Verbal Content Understanding Verbal and Non-Verbal Content: 4. Understands (complex and basic) - clear comprehension without cues or repetitions   Memory/Recall Ability Memory/Recall Ability : That he or she is in a hospital/hospital unit;Current season;Staff names and faces   Refer to Care Plan for Long Term Goals  SHORT TERM GOAL WEEK 1 OT Short Term Goal 1 (Week 1): STGs=LTGs due to patient's ELOS.  Recommendations for other services: Neuropsych and Therapeutic Recreation  Stress management   Skilled Therapeutic Intervention  Session began with introduction to OT role, OT POC, and general orientation to rehab unit/schedule. Pt completes full-body sponge-bathing with levels of assistance noted below. Pt ambulates into/out of bathroom with CGA + RW, 3/3 toileting tasks completed with CGA-Min A for standing balance. Pt remained sitting in WC, posey belt activated, family at side.   ADL ADL Eating: Set up Where Assessed-Eating: Wheelchair;Bed level Grooming: Setup Where Assessed-Grooming: Sitting at sink Upper Body Bathing: Minimal cueing;Setup Where Assessed-Upper Body Bathing: Sitting at  sink Lower Body Bathing: Minimal assistance Where Assessed-Lower Body Bathing: Standing at sink;Sitting at sink Upper Body Dressing: Setup Where Assessed-Upper Body Dressing: Sitting at sink Lower Body Dressing: Minimal assistance Where Assessed-Lower Body Dressing: Standing at sink;Sitting at sink Toileting: Contact guard Where Assessed-Toileting: Toilet;Bedside Commode Toilet Transfer: Furniture Conservator/restorer Method: Proofreader: Gaffer: Not assessed Film/video Editor: Not assessed Mobility  Transfers Sit to Stand: Contact Guard/Touching assist Stand to Sit: Contact Guard/Touching assist   Discharge Criteria: Patient will be discharged from OT if patient refuses treatment 3 consecutive times without medical reason, if treatment goals not met, if there is a change in medical status, if patient makes no progress towards goals or if patient is discharged from hospital.  The above assessment, treatment plan, treatment alternatives and goals were discussed and mutually agreed upon: by patient  Nereida Habermann, OTR/L, MSOT  01/30/2024, 10:40 AM

## 2024-01-31 ENCOUNTER — Encounter: Admitting: Physical Medicine and Rehabilitation

## 2024-01-31 MED ORDER — BACLOFEN 5 MG HALF TABLET: 5.0000 mg | ORAL_TABLET | Freq: Two times a day (BID) | ORAL | Status: DC | PRN

## 2024-01-31 NOTE — Progress Notes (Signed)
 Occupational Therapy Session Note  Patient Details  Name: Jerry [name] MRN: [id number] Date of Birth: 02/23/1928  Today's Date: 01/31/2024 OT Individual Time: 8892-8795 OT Individual Time Calculation (min): 57 min    Short Term Goals: Week 1:  OT Short Term Goal 1 (Week 1): STGs=LTGs due to patient's ELOS.  Skilled Therapeutic Interventions/Progress Updates:    Pt received supine with family present, agreeable to OT session. He reported throat pain and requested a cough drop which was provided. He came to EOB with (S) using bed rail. He stood with CGA and using the RW transferred to the w/c via stand pivot. Pt was taken via w/c to the therapy gym for time management. He completed 100 ft of functional mobility with the RW to challenge endurance, requiring CGA to (S) level assist. Pt completed blocked practice sit <> stand, 3x10 repetitions with no UE support, to challenge generalized strengthening for ADL transfers, as well as increasing functional activity tolerance and cardiorespiratory endurance. Pt required CGA and cueing for posture/erect trunk. He then completed UE strengthening activity via bilateral grasp on 2 kg ball, bringing it into a combo bicep curl into a shoulder press, followed by modified sit ups holding ball at his chest. 3x10 repetitions of both. Circuit intended to increase functional activity tolerance, as well as UE and core strengthening needed for ADL independence. He completed another 2 bouts of 150 ft of functional mobility at CGA level with the RW to close session. Carryover to increased endurance for ADL at home. Pt was left sitting up in the wheelchair with all needs met, chair alarm set, and call bell within reach.    Therapy Documentation Precautions:  Precautions Precautions: Fall Recall of Precautions/Restrictions: Impaired Precaution/Restrictions Comments: HOH Therapy/Group: Individual Therapy  [name] 01/31/2024, 8:21 AM

## 2024-01-31 NOTE — Progress Notes (Incomplete)
 Physical Therapy Session Note  Patient Details  Name: Jerry [name] MRN: [id number] Date of Birth: [birth year]  {CHL IP REHAB PT TIME CALCULATION:[id number]}  Short Term Goals: Week 1:  PT Short Term Goal 1 (Week 1): STG = LTG due to ELOS  Skilled Therapeutic Interventions/Progress Updates:     Pt received seated in [facility] and agrees to therapy. No complaint of pain. WC transport to gym. Pt performs sit to stand with RW and cues for hand placement sequencing. Pt ambulates x175' with RW and CGA, with cues for upright gaze to improve posture and balance, and increasing bilateral stride length to decrease risk for falls. Pt takes brief seated rest break. Pt then ambulates x175' without AD requiring minA for stability and having one slight LOB to the Rt, with similar cues for assistance. Rest break.   Pt performs alternating foot taps on 3 step to challenge balance and coordination. Pt requires minA for most of activity, with one instance of modA for LOB posteriorly> PT provides cues for increased step height and facilitation of lateral weight shifting. Pt completes x20 total prior to rest break. Cues fprovided for positioning and safety for transition back to seated. Pt performs additional bout of x20 with improved balance and step height.   WC transport back to room. Left seated with all needs within reach.   Therapy Documentation Precautions:  Precautions Precautions: Fall Recall of Precautions/Restrictions: Impaired Precaution/Restrictions Comments: HOH    Therapy/Group: Individual Therapy  BRANTLEY WILEY 01/31/2024, 8:24 AM

## 2024-01-31 NOTE — Plan of Care (Signed)
°  Problem: Consults Goal: RH GENERAL PATIENT EDUCATION Description: See Patient Education module for education specifics. Outcome: Progressing   Problem: RH BOWEL ELIMINATION Goal: RH STG MANAGE BOWEL WITH ASSISTANCE Description: STG Manage Bowel with minimal  Assistance. Outcome: Progressing   Problem: RH BLADDER ELIMINATION Goal: RH STG MANAGE BLADDER WITH ASSISTANCE Description: STG Manage Bladder With min Assistance Outcome: Progressing   Problem: RH SKIN INTEGRITY Goal: RH STG SKIN FREE OF INFECTION/BREAKDOWN Description: Mange skin free of infection with min assistance Outcome: Progressing   Problem: RH SAFETY Goal: RH STG ADHERE TO SAFETY PRECAUTIONS W/ASSISTANCE/DEVICE Description: STG Adhere to Safety Precautions With min Assistance/Device. Outcome: Progressing   Problem: RH PAIN MANAGEMENT Goal: RH STG PAIN MANAGED AT OR BELOW PT'S PAIN GOAL Description: <4 w/ prns Outcome: Progressing   Problem: RH KNOWLEDGE DEFICIT GENERAL Goal: RH STG INCREASE KNOWLEDGE OF SELF CARE AFTER HOSPITALIZATION Description: Manage increase knowledge of self care after hospitalization with min assistance from spouse using educational materials provided Outcome: Progressing

## 2024-01-31 NOTE — Progress Notes (Signed)
 Patient ID: Jerry [name], male   DOB: 1928/07/25, 89 y.o.   MRN: [id number]  SW received updates from PA-Dan that after speaking with the family they have cancelled their other appt and pt will remain here through 12/18.   Graeme Jude, MSW, LCSW Office: [phone] Cell: [phone] Fax: [phone]

## 2024-01-31 NOTE — Progress Notes (Signed)
 Occupational Therapy Session Note  Patient Details  Name: Jerry [name] MRN: [id number] Date of Birth: [birth year]  Today's Date: 01/31/2024 OT Individual Time: 9089-9044 OT Individual Time Calculation (min): 45 min  and Today's Date: 01/31/2024 OT Missed Time: 10 Minutes Missed Time Reason: Other (comment) (MD rounding)  Today's Date: 01/31/2024 OT Individual Time: 8594-8554 OT Individual Time Calculation (min): 40 min   Short Term Goals: Week 1:  OT Short Term Goal 1 (Week 1): STGs=LTGs due to patient's ELOS.  Skilled Therapeutic Interventions/Progress Updates:   Session 1:  Pt greeted sitting in WC, no reports of pain although presents with increased fatigue. Pt transported from room<>day room in [facility] for time/energy conservation. In day room, pt instructed in 2x1 min rep of alternating toe taps onto cone, CGA provided with BUE supported on RW. Pt endorses increased dizziness, BP assessed at 114/59. BP=112/73 upon sit>stand, post activity BP=120/54. Activity upgraded to alternating taps while ambulating, pt demo's decreased RLE step height but CGA-close supervision + RW provided. Activity further modified by removing AD, but decreasing target height, CGA-Min A provided. Pt completes x2 reps of activity all for dynamic standing balance challenge and endurance training for carryover into ADLs/functional transfers. DO securely chatted for request of ISB. Pt returns to supine with increased time + use of bed rail. Pt remained resting in bed with all immediate needs met, spouse at bedside.   Session 2:  Pt greeted resting in bed, no reports of pain, receptive to ADL retraining at shower-level. Supine>sit EOB with supervision and increased time. Ambulating walk-in shower transfer with close supervision-CGA + RW. Pt requires reminders to use TTB during bathing vs standing. Min cuing for thoroughness of care and to address full-body (pt omits bathing LB). Pt dresses LB with intermediate CGA +  increased time. Continues cues required for hand-placement during sit<>stands to avoid pulling on RW. Setup A provided for UB dressing. Pt requires A for BLE into bed. Pt remains resting in bed with all immediate needs met, spouse at bedside.   Therapy Documentation Precautions:  Precautions Precautions: Fall Recall of Precautions/Restrictions: Impaired Precaution/Restrictions Comments: HOH   Therapy/Group: Individual Therapy  Nereida Habermann, OTR/L, MSOT  01/31/2024, 6:17 AM

## 2024-01-31 NOTE — Progress Notes (Signed)
 PROGRESS NOTE   Subjective/Complaints:  No acute complaints.  No events overnight.  Vitals are stable.  Some red colored urine but no clots in foley Family notices he is more tired today, he thinks he slept well.  No further hiccups!  ROS: Denies fevers, chills, N/V, abdominal pain, constipation, diarrhea, SOB, cough, chest pain, new weakness or paraesthesias.   + sore throat  Objective:   No results found. Recent Labs    01/29/24 0531 01/30/24 0518  WBC 13.0* 10.6*  HGB 11.2* 11.3*  HCT 33.8* 35.8*  PLT 329 361   Recent Labs    01/29/24 0531 01/30/24 0518  NA 141 140  K 3.7 4.0  CL 106 105  CO2 26 21*  GLUCOSE 115* 106*  BUN 27* 27*  CREATININE 0.85 1.04  CALCIUM  8.4* 8.6*    Intake/Output Summary (Last 24 hours) at 01/31/2024 0850 Last data filed at 01/31/2024 0748 Gross per 24 hour  Intake 1520 ml  Output 1125 ml  Net 395 ml     Wound 01/29/24 1823 Pressure Injury Buttocks Left Stage 3 -  Full thickness tissue loss. Subcutaneous fat may be visible but bone, tendon or muscle are NOT exposed. (Active)     Wound 01/29/24 1824 Pressure Injury Sacrum Lower Stage 1 -  Intact skin with non-blanchable redness of a localized area usually over a bony prominence. (Active)     Wound 01/30/24 1039 Pressure Injury Heel Left Unstageable - Full thickness tissue loss in which the base of the injury is covered by slough (yellow, tan, gray, green or brown) and/or eschar (tan, brown or black) in the wound bed. (Active)     Wound 01/30/24 1040 Pressure Injury Foot Left;Lateral Unstageable - Full thickness tissue loss in which the base of the injury is covered by slough (yellow, tan, gray, green or brown) and/or eschar (tan, brown or black) in the wound bed. (Active)    Physical Exam: Vital Signs Blood pressure 128/75, pulse 75, temperature 97.6 F (36.4 C), resp. rate 18, height 5' 5 (1.651 m), weight 70.8 kg, SpO2  95%. Constitutional: No apparent distress. Appropriate appearance for age. Sitting up in chair HENT: No JVD. Neck Supple. Trachea midline. Atraumatic, normocephalic. Eyes: PERRLA. EOMI. Visual fields grossly intact.  Cardiovascular: RRR, no murmurs/rub/gallops. No Edema. Peripheral pulses 2+  Respiratory: CTAB. No rales, rhonchi, or wheezing. On RA.  Abdomen: + bowel sounds, normoactive. No distention or tenderness.   GU: Not examined.  +Foley, draining red but clear urine. Skin: C/D/I. No apparent lesions. Sacral DTI, heel wounds as pictured below.          MSK:      No apparent deformity.    Neurologic exam:  Patient is alert, alert, and oriented x 3. More lethargic.    He is hard of hearing.  CN exam non-focal.  MMT: 4+/5 bilateral UE prox to distal. BLE 4-/5 HF, KE and 4/5 ADF/PF.  Sensory exam normal for light touch and pain in all 4 limbs.  No limb ataxia or cerebellar signs.  No abnormal tone appreciated.      Physical exam unchanged from the above on reexamination 01/31/24  Assessment/Plan: 1. Functional deficits which require 3+ hours per day of interdisciplinary therapy in a comprehensive inpatient rehab setting. Physiatrist is providing close team supervision and 24 hour management of active medical problems listed below. Physiatrist and rehab team continue to assess barriers to discharge/monitor patient progress toward functional and medical goals  Care Tool:  Bathing    Body parts bathed by patient: Right arm, Left arm, Chest, Abdomen, Front perineal area, Right upper leg, Left upper leg, Face, Buttocks   Body parts bathed by helper: Right lower leg, Left lower leg     Bathing assist Assist Level: Minimal Assistance - Patient > 75%     Upper Body Dressing/Undressing Upper body dressing   What is the patient wearing?: Pull over shirt    Upper body assist Assist Level: Set up assist    Lower Body Dressing/Undressing Lower body dressing       What is the patient wearing?: Pants, Underwear/pull up     Lower body assist Assist for lower body dressing: Minimal Assistance - Patient > 75%     Toileting Toileting    Toileting assist Assist for toileting: Contact Guard/Touching assist     Transfers Chair/bed transfer  Transfers assist     Chair/bed transfer assist level: Contact Guard/Touching assist     Locomotion Ambulation   Ambulation assist      Assist level: Contact Guard/Touching assist Assistive device: Walker-rolling Max distance: 180'   Walk 10 feet activity   Assist     Assist level: Contact Guard/Touching assist Assistive device: Walker-rolling   Walk 50 feet activity   Assist    Assist level: Contact Guard/Touching assist Assistive device: Walker-rolling    Walk 150 feet activity   Assist    Assist level: Contact Guard/Touching assist Assistive device: Walker-rolling    Walk 10 feet on uneven surface  activity   Assist     Assist level: Minimal Assistance - Patient > 75% Assistive device: Other (comment) (no device)   Wheelchair     Assist Is the patient using a wheelchair?: Yes Type of Wheelchair: Manual    Wheelchair assist level: Dependent - Patient 0%      Wheelchair 50 feet with 2 turns activity    Assist        Assist Level: Dependent - Patient 0%   Wheelchair 150 feet activity     Assist      Assist Level: Dependent - Patient 0%   Blood pressure 128/75, pulse 75, temperature 97.6 F (36.4 C), resp. rate 18, height 5' 5 (1.651 m), weight 70.8 kg, SpO2 95%.   Medical Problem List and Plan: 1. Functional deficits secondary to septic shock secondary to acute pyelonephritis and obstructive uropathy with E. coli bacteremia.  Completing course of IV ceftriaxone              -patient may shower             -ELOS/Goals: 10-14 days, supervision goals with PT, and sup/min with OT - 12/18 DC date   - 12/9: CGA today  Stable to continue  inpatient rehab  2.  Antithrombotics: -DVT/anticoagulation/right upper extremity SVT 12/3: Supportive care.  Mechanical: Antiembolism stockings, thigh (TED hose) Bilateral lower extremities             -antiplatelet therapy: Aspirin  81 mg daily and Plavix  75 mg daily 3. Pain Management:  Tylenol  as needed             -denied pain today  - 12-9: Monitor  Tylenol  use due to mild LFT elevation; seems rare, so unlikely contributor.  Likely hypoperfusion with sepsis.  Trend with next labs.  4. Mood/Behavior/Sleep: Melatonin 5 mg nightly as needed             -antipsychotic agents: N/A 5. Neuropsych/cognition: This patient is capable of making decisions on his own behalf. 6. Skin/Wound Care/WOC follow-up: Pressure injury to the sacrum  - Offloading for sacrum, heals with Mepilex 7. Fluids/Electrolytes/Nutrition: Routine IN and outs with follow-up chemistries             -decreased nutritional storage--encourage PO, appetite is not great but it's picking up per son  - 12-9: Eating okay, monitor  8.  Bilateral ureteral stones/left ureteral obstruction.  Status post bilateral ureteral stents 11/29 per Dr.Wrenn.  Foley tube in place             -has persistent hematuria and sediment   - 12-10: DC Foley catheter trial today. Will get UA per family req with first void for lethargy.   9.  Acute metabolic encephalopathy secondary to sepsis and acute infection.  Resolved with treatment. He is HOH 10.  New onset HFrEF.  Echocardiogram LVEF 35 to 40% with global hypokinesis.  Patient received IV diuresis and completed.  Elevated troponin felt to be related to demand ischemia trending down to 125   - no s/s volume overload Filed Weights   01/29/24 1829  Weight: 70.8 kg     11.  Lactic acidosis.  Resolved with management of sepsis 12.  AKI.  Baseline creatinine 0.9.  Creatinine 1.75 with peak of 2.16.  Complicated by mild hydronephrosis from ureteral obstruction.  Creatinine improved AKI resolved.   Follow-up chemistries   - 12/9: labs with stable BUN but mildly uptrending creatinine; encourage p.o. fluids.  13.  History of PAD.  Patient manage on DAPT for stent x 2 at SFA/popliteal artery.  Antiplatelets held on admission secondary to sepsis and thrombocytopenia and resumed 01/27/2024 14.  New onset atrial fibrillation.  Follow-up cardiology services.  Continue Toprol -XL 50 mg daily   - Regular rate and rhythm on exam 12-9 15.  History of subdural hematoma and received CIR.  Cranial CT scan 11/28 showing no acute abnormality 16.  Hypertension.  Monitor with increased mobility    01/31/2024    5:39 AM 01/30/2024    8:06 PM 01/30/2024    1:51 PM  Vitals with BMI  Systolic 128 109 97  Diastolic 75 97 55  Pulse 75 78 78     17.  Recent right clavicle fracture.  Advanced to weightbearing as tolerated. 18.  Hyperlipidemia.  Lipitor 19.  Constipation.  MiraLAX  daily, Senokot S1 tablet nightly             -had BM just before I came to see him today  Last bowel movement 12-9, small  20.  Intractable hiccups             -dc gabapentin              -trial of low-dose baclofen  5mg  bid. Observe for effect/tolerance 12-9: Hiccups continue, remain bothersome. Start protonix  40 mg daily, monitor on baclofen  today--monitor 12/10: Hiccups resolved; make baclofen  PRN    21. Sore throat. S/p extubation. Add cepacol lozenges PRN  LOS: 2 days A FACE TO FACE EVALUATION WAS PERFORMED  Jerry [name] Likes 01/31/2024, 8:50 AM

## 2024-01-31 NOTE — Care Management (Signed)
 Inpatient Rehabilitation Center Individual Statement of Services  Patient Name:  Jerry [name]  Date:  01/31/2024  Welcome to the Inpatient Rehabilitation Center.  Our goal is to provide you with an individualized program based on your diagnosis and situation, designed to meet your specific needs.  With this comprehensive rehabilitation program, you will be expected to participate in at least 3 hours of rehabilitation therapies Monday-Friday, with modified therapy programming on the weekends.  Your rehabilitation program will include the following services:  Physical Therapy (PT), Occupational Therapy (OT), 24 hour per day rehabilitation nursing, Therapeutic Recreaction (TR), Psychology, Neuropsychology, Care Coordinator, Rehabilitation Medicine, Nutrition Services, Pharmacy Services, and Other  Weekly team conferences will be held on Tuesday to discuss your progress.  Your Inpatient Rehabilitation Care Coordinator will talk with you frequently to get your input and to update you on team discussions.  Team conferences with you and your family in attendance may also be held.  Expected length of stay: 7-10 days     Overall anticipated outcome: Supervision  Depending on your progress and recovery, your program may change. Your Inpatient Rehabilitation Care Coordinator will coordinate services and will keep you informed of any changes. Your Inpatient Rehabilitation Care Coordinator's name and contact numbers are listed  below.  The following services may also be recommended but are not provided by the Inpatient Rehabilitation Center:  Driving Evaluations Home Health Rehabiltiation Services Outpatient Rehabilitation Services Vocational Rehabilitation   Arrangements will be made to provide these services after discharge if needed.  Arrangements include referral to agencies that provide these services.  Your insurance has been verified to be:  Medicare A/B   Your primary doctor is:   Layman Piety   Pertinent information will be shared with your doctor and your insurance company.  Inpatient Rehabilitation Care Coordinator:  Graeme Jude, KEN [phone] or ([phone]  Information discussed with and copy given to patient by: Graeme DELENA Jude, 01/31/2024, 12:52 PM

## 2024-02-01 LAB — URINALYSIS, ROUTINE W REFLEX MICROSCOPIC
Bilirubin Urine: NEGATIVE
Glucose, UA: NEGATIVE mg/dL
Ketones, ur: NEGATIVE mg/dL
Nitrite: NEGATIVE
Protein, ur: 100 mg/dL — AB
Specific Gravity, Urine: 1.02 (ref 1.005–1.030)
pH: 6 (ref 5.0–8.0)

## 2024-02-01 LAB — URINALYSIS, MICROSCOPIC (REFLEX)
RBC / HPF: >50 RBC/hpf (ref 0–5)
WBC, UA: >50 WBC/hpf (ref 0–5)

## 2024-02-01 LAB — HEPATIC FUNCTION PANEL
ALT: 63 U/L — ABNORMAL HIGH (ref 0–44)
AST: 42 U/L — ABNORMAL HIGH (ref 15–41)
Albumin: 2.4 g/dL — ABNORMAL LOW (ref 3.5–5.0)
Alkaline Phosphatase: 233 U/L — ABNORMAL HIGH (ref 38–126)
Bilirubin, Direct: 0.2 mg/dL (ref 0.0–0.2)
Indirect Bilirubin: 0.9 mg/dL (ref 0.3–0.9)
Total Bilirubin: 1.1 mg/dL (ref 0.0–1.2)
Total Protein: 5.7 g/dL — ABNORMAL LOW (ref 6.5–8.1)

## 2024-02-01 MED ORDER — CEPHALEXIN 250 MG PO CAPS
500.0000 mg | ORAL_CAPSULE | Freq: Two times a day (BID) | ORAL | Status: DC
  Administered 2024-02-01 – 2024-02-03 (×5): 500 mg via ORAL
  Filled 2024-02-01 (×5): qty 2

## 2024-02-01 NOTE — IPOC Note (Signed)
 Overall Plan of Care [facility]) Patient Details Name: Jerry [name] MRN: [id number] DOB: Jul 30, 1928  Admitting Diagnosis: Sepsis [facility])  Hospital Problems: Principal Problem:   Sepsis (HCC)     Functional Problem List: Nursing Bladder, Bowel, Edema, Endurance, Medication Management, Motor, Pain, Safety, Skin Integrity  PT Balance, Endurance, Pain, Safety, Motor, Sensory  OT Balance, Endurance, Pain, Safety  SLP    TR         Basic ADLs: OT Bathing, Dressing, Toileting     Advanced  ADLs: OT       Transfers: PT Bed Mobility, Bed to Chair, Customer Service Manager, Tub/Shower     Locomotion: PT Ambulation, Stairs     Additional Impairments: OT    SLP        TR      Anticipated Outcomes Item Anticipated Outcome  Self Feeding    Swallowing      Basic self-care  Supervision  Toileting  Supervision   Bathroom Transfers Supervision  Bowel/Bladder  manage owels with mn assistance/ mange bladder with trial voiding post foley catheter  Transfers  supervision  Locomotion  supervision ambulatory  Communication     Cognition     Pain  <4 w/prns  Safety/Judgment  manage safety with minimal assistance   Therapy Plan: PT Intensity: Minimum of 1-2 x/day ,45 to 90 minutes PT Frequency: 5 out of 7 days PT Duration Estimated Length of Stay: 7-10 days OT Intensity: Minimum of 1-2 x/day, 45 to 90 minutes OT Frequency: 5 out of 7 days OT Duration/Estimated Length of Stay: 7-10 days     Team Interventions: Nursing Interventions Patient/Family Education, Bladder Management, Bowel Management, Disease Management/Prevention, Pain Management, Medication Management, Skin Care/Wound Management, Discharge Planning  PT interventions Ambulation/gait training, Community reintegration, DME/adaptive equipment instruction, Neuromuscular re-education, Psychosocial support, Stair training, UE/LE Strength taining/ROM, Warden/ranger, Discharge planning, Functional electrical  stimulation, Pain management, Skin care/wound management, Therapeutic Activities, UE/LE Coordination activities, Cognitive remediation/compensation, Disease management/prevention, Functional mobility training, Patient/family education, Splinting/orthotics, Therapeutic Exercise, Visual/perceptual remediation/compensation  OT Interventions Warden/ranger, Community reintegration, Discharge planning, Disease mangement/prevention, DME/adaptive equipment instruction, Functional mobility training, Neuromuscular re-education, Pain management, Patient/family education, Psychosocial support, Self Care/advanced ADL retraining, Skin care/wound managment, Therapeutic Activities, Therapeutic Exercise, UE/LE Strength taining/ROM, Wheelchair propulsion/positioning, Cognitive remediation/compensation  SLP Interventions    TR Interventions    SW/CM Interventions Discharge Planning, Psychosocial Support, Patient/Family Education   Barriers to Discharge MD  Medical stability, Home enviroment access/loayout, Wound care, Lack of/limited family support, and Pending surgery  Nursing Decreased caregiver support, Home environment access/layout, Wound Care Discharge: House  Discharge Home Layout: Two level, Able to live on main level with bedroom/bathroom  Alternate Level Stairs-Number of Steps: flight  Discharge Home Access: Stairs to enter  Entrance Stairs-Rails: Left  Entrance Stairs-Number of Steps: 3  PT Inaccessible home environment, Decreased caregiver support, Home environment access/layout lives with wife who cannot provide physical assistance, 6-7 STE with BHRs  OT Home environment access/layout    SLP      SW Decreased caregiver support, Lack of/limited family support     Team Discharge Planning: Destination: PT-Home ,OT- Home , SLP-  Projected Follow-up: PT-Outpatient PT, OT-  Home health OT, SLP-  Projected Equipment Needs: PT-To be determined, OT- To be determined, SLP-  Equipment Details:  PT- , OT-  Patient/family involved in discharge planning: PT- Patient, Family member/caregiver,  OT-Patient, SLP-   MD ELOS: 10-14 days Medical Rehab Prognosis:  Excellent Assessment: The patient has been admitted for CIR  therapies with the diagnosis of debility due to urosepsis. The team will be addressing functional mobility, strength, stamina, balance, safety, adaptive techniques and equipment, self-care, bowel and bladder mgt, patient and caregiver education. Goals have been set at supverision PT, OT. Anticipated discharge destination is home.       See Team Conference Notes for weekly updates to the plan of care

## 2024-02-01 NOTE — Plan of Care (Signed)
°  Problem: Consults Goal: RH GENERAL PATIENT EDUCATION Description: See Patient Education module for education specifics. Outcome: Progressing   Problem: RH BOWEL ELIMINATION Goal: RH STG MANAGE BOWEL WITH ASSISTANCE Description: STG Manage Bowel with minimal  Assistance. Outcome: Progressing   Problem: RH BLADDER ELIMINATION Goal: RH STG MANAGE BLADDER WITH ASSISTANCE Description: STG Manage Bladder With min Assistance Outcome: Progressing   Problem: RH SKIN INTEGRITY Goal: RH STG SKIN FREE OF INFECTION/BREAKDOWN Description: Mange skin free of infection with min assistance Outcome: Progressing   Problem: RH SAFETY Goal: RH STG ADHERE TO SAFETY PRECAUTIONS W/ASSISTANCE/DEVICE Description: STG Adhere to Safety Precautions With min Assistance/Device. Outcome: Progressing   Problem: RH PAIN MANAGEMENT Goal: RH STG PAIN MANAGED AT OR BELOW PT'S PAIN GOAL Description: <4 w/ prns Outcome: Progressing   Problem: RH KNOWLEDGE DEFICIT GENERAL Goal: RH STG INCREASE KNOWLEDGE OF SELF CARE AFTER HOSPITALIZATION Description: Manage increase knowledge of self care after hospitalization with min assistance from spouse using educational materials provided Outcome: Progressing

## 2024-02-01 NOTE — Progress Notes (Signed)
 PROGRESS NOTE   Subjective/Complaints:  No acute complaints.  No events overnight.  Vitals are stable.  Worked hard with therapies this a.m. LFTs stable / improving UA yesterday with rare bacteria, large blood, mod LE, no nitrites; culture pending Bladder scans low, continent. LBM 12/10  ROS: Denies fevers, chills, N/V, abdominal pain, constipation, diarrhea, SOB, cough, chest pain, new weakness or paraesthesias.   + sore throat/cough--ongoing + Nocturia  Objective:   No results found. Recent Labs    01/30/24 0518  WBC 10.6*  HGB 11.3*  HCT 35.8*  PLT 361   Recent Labs    01/30/24 0518  NA 140  K 4.0  CL 105  CO2 21*  GLUCOSE 106*  BUN 27*  CREATININE 1.04  CALCIUM  8.6*    Intake/Output Summary (Last 24 hours) at 02/01/2024 1100 Last data filed at 02/01/2024 0600 Gross per 24 hour  Intake 997 ml  Output 1370 ml  Net -373 ml     Wound 01/29/24 1823 Pressure Injury Buttocks Left Stage 3 -  Full thickness tissue loss. Subcutaneous fat may be visible but bone, tendon or muscle are NOT exposed. (Active)     Wound 01/29/24 1824 Pressure Injury Sacrum Lower Stage 1 -  Intact skin with non-blanchable redness of a localized area usually over a bony prominence. (Active)     Wound 01/30/24 1039 Pressure Injury Heel Left Unstageable - Full thickness tissue loss in which the base of the injury is covered by slough (yellow, tan, gray, green or brown) and/or eschar (tan, brown or black) in the wound bed. (Active)     Wound 01/30/24 1040 Pressure Injury Foot Left;Lateral Unstageable - Full thickness tissue loss in which the base of the injury is covered by slough (yellow, tan, gray, green or brown) and/or eschar (tan, brown or black) in the wound bed. (Active)    Physical Exam: Vital Signs Blood pressure (!) 128/59, pulse 80, temperature 98.3 F (36.8 C), temperature source Oral, resp. rate 17, height 5' 5 (1.651 m),  weight 70.8 kg, SpO2 97%. Constitutional: No apparent distress. Appropriate appearance for age. Sitting up in chair HENT: No JVD. Neck Supple. Trachea midline. Atraumatic, normocephalic. Eyes: PERRLA. EOMI. Visual fields grossly intact.  Cardiovascular: RRR, no murmurs/rub/gallops. No Edema. Peripheral pulses 2+  Respiratory: CTAB. No rales, rhonchi, or wheezing. On RA.  Abdomen: + bowel sounds, normoactive. No distention or tenderness.   Skin: C/D/I. No apparent lesions. Sacral DTI, heel wounds as pictured below.          MSK:      No apparent deformity.    Neurologic exam:  Patient is alert, alert, and oriented x 3. More lethargic.    He is hard of hearing.  CN exam non-focal.  MMT: 4+/5 bilateral UE prox to distal. BLE 4-/5 HF, KE and 4/5 ADF/PF.  Sensory exam normal for light touch and pain in all 4 limbs.  No limb ataxia or cerebellar signs.  No abnormal tone appreciated.      Physical exam unchanged from the above on reexamination 02/01/2024       Assessment/Plan: 1. Functional deficits which require 3+ hours per day of interdisciplinary therapy in a  comprehensive inpatient rehab setting. Physiatrist is providing close team supervision and 24 hour management of active medical problems listed below. Physiatrist and rehab team continue to assess barriers to discharge/monitor patient progress toward functional and medical goals  Care Tool:  Bathing    Body parts bathed by patient: Right arm, Left arm, Chest, Abdomen, Front perineal area, Right upper leg, Left upper leg, Face, Buttocks, Right lower leg, Left lower leg   Body parts bathed by helper: Right lower leg, Left lower leg     Bathing assist Assist Level: Contact Guard/Touching assist     Upper Body Dressing/Undressing Upper body dressing   What is the patient wearing?: Pull over shirt    Upper body assist Assist Level: Set up assist    Lower Body Dressing/Undressing Lower body dressing      What  is the patient wearing?: Pants, Underwear/pull up     Lower body assist Assist for lower body dressing: Minimal Assistance - Patient > 75%     Toileting Toileting    Toileting assist Assist for toileting: Contact Guard/Touching assist     Transfers Chair/bed transfer  Transfers assist     Chair/bed transfer assist level: Contact Guard/Touching assist     Locomotion Ambulation   Ambulation assist      Assist level: Contact Guard/Touching assist Assistive device: Walker-rolling Max distance: 180'   Walk 10 feet activity   Assist     Assist level: Contact Guard/Touching assist Assistive device: Walker-rolling   Walk 50 feet activity   Assist    Assist level: Contact Guard/Touching assist Assistive device: Walker-rolling    Walk 150 feet activity   Assist    Assist level: Contact Guard/Touching assist Assistive device: Walker-rolling    Walk 10 feet on uneven surface  activity   Assist     Assist level: Minimal Assistance - Patient > 75% Assistive device: Other (comment) (no device)   Wheelchair     Assist Is the patient using a wheelchair?: Yes Type of Wheelchair: Manual    Wheelchair assist level: Dependent - Patient 0%      Wheelchair 50 feet with 2 turns activity    Assist        Assist Level: Dependent - Patient 0%   Wheelchair 150 feet activity     Assist      Assist Level: Dependent - Patient 0%   Blood pressure (!) 128/59, pulse 80, temperature 98.3 F (36.8 C), temperature source Oral, resp. rate 17, height 5' 5 (1.651 m), weight 70.8 kg, SpO2 97%.   Medical Problem List and Plan: 1. Functional deficits secondary to septic shock secondary to acute pyelonephritis and obstructive uropathy with E. coli bacteremia.  Completing course of IV ceftriaxone              -patient may shower             -ELOS/Goals: 10-14 days, supervision goals with PT, and sup/min with OT - 12/18 DC date   - 12/9: CGA  today  Stable to continue inpatient rehab  2.  Antithrombotics: -DVT/anticoagulation/right upper extremity SVT 12/3: Supportive care.  Mechanical: Antiembolism stockings, thigh (TED hose) Bilateral lower extremities             -antiplatelet therapy: Aspirin  81 mg daily and Plavix  75 mg daily 3. Pain Management:  Tylenol  as needed             -denied pain today  - 12-9: Monitor Tylenol  use due to mild LFT elevation; seems  rare, so unlikely contributor.  Likely hypoperfusion with sepsis.  Trend with next labs.  12-11: LFTs downtrending.  Monitor.  4. Mood/Behavior/Sleep: Melatonin 5 mg nightly as needed             -antipsychotic agents: N/A 5. Neuropsych/cognition: This patient is capable of making decisions on his own behalf. 6. Skin/Wound Care/WOC follow-up: Pressure injury to the sacrum  - Offloading for sacrum, heals with Mepilex 7. Fluids/Electrolytes/Nutrition: Routine IN and outs with follow-up chemistries             -decreased nutritional storage--encourage PO, appetite is not great but it's picking up per son  - 12-9: Eating okay, monitor  8.  Bilateral ureteral stones/left ureteral obstruction.  Status post bilateral ureteral stents 11/29 per Dr.Wrenn.  Foley tube in place             -has persistent hematuria and sediment   - 12-10: DC Foley catheter trial today. Will get UA per family req with first void for lethargy.   12-11: Voiding well, UA with some bacteria and leukocyte esterase, WBCs so we will start Keflex  500 mg twice daily for 7 days and follow cultures.  Timed toileting given urinary frequency  9.  Acute metabolic encephalopathy secondary to sepsis and acute infection.  Resolved with treatment. He is HOH 10.  New onset HFrEF.  Echocardiogram LVEF 35 to 40% with global hypokinesis.  Patient received IV diuresis and completed.  Elevated troponin felt to be related to demand ischemia trending down to 125   - no s/s volume overload Filed Weights   01/29/24 1829   Weight: 70.8 kg     11.  Lactic acidosis.  Resolved with management of sepsis 12.  AKI.  Baseline creatinine 0.9.  Creatinine 1.75 with peak of 2.16.  Complicated by mild hydronephrosis from ureteral obstruction.  Creatinine improved AKI resolved.  Follow-up chemistries   - 12/9: labs with stable BUN but mildly uptrending creatinine; encourage p.o. fluids.  - 12-11: BMP, CBC tomorrow a.m.  13.  History of PAD.  Patient manage on DAPT for stent x 2 at SFA/popliteal artery.  Antiplatelets held on admission secondary to sepsis and thrombocytopenia and resumed 01/27/2024 14.  New onset atrial fibrillation.  Follow-up cardiology services.  Continue Toprol -XL 50 mg daily   - Regular rate and rhythm on exam  15.  History of subdural hematoma and received CIR.  Cranial CT scan 11/28 showing no acute abnormality 16.  Hypertension.  Monitor with increased mobility  - Normotensive, vital stable.  Monitor.    02/01/2024    3:15 AM 01/31/2024    8:32 PM 01/31/2024    5:33 PM  Vitals with BMI  Systolic 128 115 869  Diastolic 59 46 61  Pulse 80 82 79     17.  Recent right clavicle fracture.  Advanced to weightbearing as tolerated. 18.  Hyperlipidemia.  Lipitor 19.  Constipation.  MiraLAX  daily, Senokot S1 tablet nightly             -had BM just before I came to see him today  Last bowel movement 12-9, small  20.  Intractable hiccups             -dc gabapentin              -trial of low-dose baclofen  5mg  bid. Observe for effect/tolerance 12-9: Hiccups continue, remain bothersome. Start protonix  40 mg daily, monitor on baclofen  today--monitor 12/10: Hiccups resolved; make baclofen  PRN    21. Sore  throat. S/p extubation. Add cepacol lozenges PRN  - 12-11: Sore throat improving, with mild cough.  Encouraged incentive spirometer.  No concerning signs on exam.  Consider chest x-ray if no improvement.  LOS: 3 days A FACE TO FACE EVALUATION WAS PERFORMED  Joesph JAYSON Likes 02/01/2024, 11:00 AM

## 2024-02-01 NOTE — Progress Notes (Signed)
 Physical Therapy Session Note  Patient Details  Name: Jerry [name] MRN: [id number] Date of Birth: Dec 11, 1928  Today's Date: 02/01/2024 PT Individual Time: 9091-8989 + 8881-8799 PT Individual Time Calculation (min): 62 min + 42 min  Short Term Goals: Week 1:  PT Short Term Goal 1 (Week 1): STG = LTG due to ELOS  Skilled Therapeutic Interventions/Progress Updates:    SESSION 1: Pt presents in room in recliner, agreeable to PT. Pt denies pain aside from a sore throat. Pt family requesting morning medications, therapist communicates with nursing staff on location of pt during session. Session focused on therapeutic activities for upright tolerance and education on energy conservation and function of air mattress and NMR for dynamic standing balance,  BLE coordination, BLE muscle fiber recruitment, and single limb stance. Pt family educated on air mattress purpose for relieving pressure on pt sacral wound, with recommendations on how often to get out of bed with resting. Pt completes transfers with CGA/close supervision with and without device throughout session. Pt completes gait with RW with CGA/close supervision from room to day room with cues for upright posture and proximity to RW, completed to promote upright tolerance. Pt then completes NMR in standing/sitting including: - forward step taps 4 step x20 alternating BLE - lateral step taps 4 step x10 BLE - step ups 2x5 4 step BLE - forward/backward 3x10' - side stepping 3x8' bilaterally - high knee marches 3x40' (first one unweighted, last 2 trials with 1.5# ankle weights) - gait 180' with bilateral ankle weights (CGA) no device - seated marches x20 alternating BLE - seated dead bugs x17 (mod to min cues for sequencing opposite arm/leg) Pt ambulates back to room with RW with supervision and comes to sitting in recliner. Pt handoff to OT in room seated in recliner at end of session.   SESSION 2: Pt presents in room seated in  recliner, agreeable to PT. Pt denies pain, states fatigue from morning sessions. Session focused on therapeutic activities to promote improved global strengthening and activity tolerance as well as NMR for BUE/BLE coordination. Pt completes transfers without device with CGA throughout session. Pt transported to ortho gym via [facility] for energy conservation. Pt completes continuous training on nustep BUE/BLE level 3 resistance for total 10 minutes maintaining ~50spm throughout, completed to promote improved activity tolerance. Pt then completes ambulatory transfer to mat. Pt then completes NMR in sitting and standing including: - two hand toss and catch seated x20 and standing x20 (1# weighted ball) - two hand toss and catch marching x30 (x2 drops) - walking forward/backward 3x7' with two hand toss and catch Pt completes transfer back to [facility] and transported back to room. Pt completes stand step transfer back to bed, completes bed mobility with supervision, slowly with cues for repositioning in supine. Pt requesting tylenol  and therapist communicates with nursing staff. Pt remains semi reclined with all needs within reach and pt wife at bedside at end of session.     Therapy Documentation Precautions:  Precautions Precautions: Fall Recall of Precautions/Restrictions: Impaired Precaution/Restrictions Comments: HOH Restrictions Weight Bearing Restrictions Per Provider Order: Yes RUE Weight Bearing Per Provider Order: Weight bearing as tolerated   Therapy/Group: Individual Therapy  Reche Ohara PT, DPT 02/01/2024, 12:08 PM

## 2024-02-01 NOTE — Progress Notes (Signed)
 Met with patient, wife and daughter to review current situation, team conference and plan of care. Reviewed  medications, wounds and wound care. Reviewed foley and foley care. Noted blood tinged urine. Reviewed co morbidities AF, HLD. Continue to follow along to provide educational needs to facilitate preparation for discharge.

## 2024-02-01 NOTE — Progress Notes (Signed)
 Patient complaining of a sore throat. Cepacol lozenge administered.

## 2024-02-01 NOTE — Progress Notes (Signed)
 Occupational Therapy Session Note  Patient Details  Name: RHYDIAN BALDI MRN: [id number] Date of Birth: 1928-10-22  Today's Date: 02/01/2024 OT Individual Time: 0805-0900 OT Individual Time Calculation (min): 55 min   Today's Date: 02/01/2024 OT Individual Time: 1010-1055 OT Individual Time Calculation (min): 45 min   Short Term Goals: Week 1:  OT Short Term Goal 1 (Week 1): STGs=LTGs due to patient's ELOS.  Skilled Therapeutic Interventions/Progress Updates:   Session 1: Pt received resting in bed (earlier than scheduled), reports of increased need to urinate throughout night impacting quality of sleep. Pt with no reports of pain, intermediate discomfort during urination. Pt comes to EOB with increased, supervision level with heavy use of bed features. Stand step transfer from EOB>recliner with no AD, but patient does reach out to furniture walk. Pt threads LB garments with Min A due to material entanglement, standing to hike with CGA for standing balance. B TEDs dependently donned for hypotension management.  Slip-on shoes donned with setup A. Pt ambulates from room<>day room with RW+ close supervision, continued cuing required for safe RW proximity. In day room, pt instructed in series of standing exercises for carryover into ADL independence and safety with functional transfers, details below: 3x1 min reps of standing marches 2x1 min reps of standing knee flexion  Pt tolerates 2.5# ankle weights with visual feedback provided for correct posture/sequencing during exercises. Pt remained toileting, NT made aware for assistance.   Session 2:  Pt greeted from direct PT handoff, no reports of pain. Functional mobility from room<>day room with close supervision + RW, continued cuing provided for safe RW proximity. In day room, pt instructed in series of BUE strengthening exercises, details below: 2x15 reps of chest press 2x15 reps of bicep curls 2x10 reps of shoulder press 2x10 reps of  torso twists 2x10 diagonal reaches  Pt completes 3/3 toileting tasks with supervision + RW. Pt remains sitting in recliner with all immediate needs met.   Therapy Documentation Precautions:  Precautions Precautions: Fall Recall of Precautions/Restrictions: Impaired Precaution/Restrictions Comments: HOH Restrictions Weight Bearing Restrictions Per Provider Order: Yes RUE Weight Bearing Per Provider Order: Weight bearing as tolerated   Therapy/Group: Individual Therapy  Nereida Habermann, OTR/L, MSOT  02/01/2024, 6:26 AM

## 2024-02-02 LAB — CBC WITH DIFFERENTIAL/PLATELET
Abs Immature Granulocytes: 0.04 K/uL (ref 0.00–0.07)
Basophils Absolute: 0.1 K/uL (ref 0.0–0.1)
Basophils Relative: 1 %
Eosinophils Absolute: 0.2 K/uL (ref 0.0–0.5)
Eosinophils Relative: 2 %
HCT: 33.1 % — ABNORMAL LOW (ref 39.0–52.0)
Hemoglobin: 10.7 g/dL — ABNORMAL LOW (ref 13.0–17.0)
Immature Granulocytes: 1 %
Lymphocytes Relative: 12 %
Lymphs Abs: 1 K/uL (ref 0.7–4.0)
MCH: 31.4 pg (ref 26.0–34.0)
MCHC: 32.3 g/dL (ref 30.0–36.0)
MCV: 97.1 fL (ref 80.0–100.0)
Monocytes Absolute: 1.1 K/uL — ABNORMAL HIGH (ref 0.1–1.0)
Monocytes Relative: 13 %
Neutro Abs: 6 K/uL (ref 1.7–7.7)
Neutrophils Relative %: 71 %
Platelets: 490 K/uL — ABNORMAL HIGH (ref 150–400)
RBC: 3.41 MIL/uL — ABNORMAL LOW (ref 4.22–5.81)
RDW: 14.6 % (ref 11.5–15.5)
WBC: 8.5 K/uL (ref 4.0–10.5)
nRBC: 0 % (ref 0.0–0.2)

## 2024-02-02 LAB — BASIC METABOLIC PANEL WITH GFR
Anion gap: 8 (ref 5–15)
BUN: 22 mg/dL (ref 8–23)
CO2: 23 mmol/L (ref 22–32)
Calcium: 9 mg/dL (ref 8.9–10.3)
Chloride: 108 mmol/L (ref 98–111)
Creatinine, Ser: 1.11 mg/dL (ref 0.61–1.24)
GFR, Estimated: >60 mL/min (ref >60)
Glucose, Bld: 110 mg/dL — ABNORMAL HIGH (ref 70–99)
Potassium: 5 mmol/L (ref 3.5–5.1)
Sodium: 139 mmol/L (ref 135–145)

## 2024-02-02 NOTE — Progress Notes (Signed)
 Occupational Therapy Session Note  Patient Details  Name: Jerry [name] MRN: [id number] Date of Birth: March 07, 1928  Today's Date: 02/02/2024 OT Individual Time: 8994-8884 OT Individual Time Calculation (min): 70 min   Today's Date: 02/02/2024 OT Individual Time: 8694-8584 OT Individual Time Calculation (min): 70 min   Short Term Goals: Week 1:  OT Short Term Goal 1 (Week 1): STGs=LTGs due to patient's ELOS.  Skilled Therapeutic Interventions/Progress Updates:   Session 1:  Pt greeted resting in bed, no reports of pain. Bed mobility with supervision + use of bed features. Ambulatory bathroom transfer with close supervision + RW, 3/3 toileting tasks in similar fashion with use of grab bar. Pt ambulates to all therapy locations with supervision + RW, requiring max verbal cues for safe RW proximity. In day room, pt instructed in a series of activities targeting RLE coordination and dynamic standing balance for decreased fall risk and carryover into ADL participation, details below: 3 x 1 min cycles of step-ups onto airex mat, close supervision + RW, Mod verbal cuing for RLE step-height to prevent toe catching onto airex mat 3 x 1 min cycles of step-overs (using ~3 in high hurdle), CGA + RW Forward into backwards walking with no AD, mild lateral LOB, requiring Min A to correct Functional reaching task towards ground-level with use of AD, close supervision provided and education on appropriate RW positioning, trialed no use of AD and task performed with CGA-Min A.   Pt remained sitting in recliner with all immediate needs met, spouse at side.   Session 2:   Pt greeted sitting in WC, awaiting medicated cough drops from LPN. Pt with reports of increased throat discomfort. Beginning of session focused on sink-side saving for improved skin integrity and comfort. OT provides assistance for neck region due to decreased visual acuity.  Pt ambulates from room>day room with CGA + no AD. In day room,  patient engaged in ball toss activity while standing on airex foam mat to challenge functional reach and dynamic standing balance. Pt completes with CGA, intermediate cues to no support self on rolling table up step up onto mat. Pt able to retrieve balls from floor level with CGA + no AD. Patient ambulates back to room holding onto tidal wave for increased balance challenge during functional mobility, CGA provided. Pt toilets with CGA + no AD for standing balance. Pt remained resting in bed with spouse at bedside. Pt remained resting in bed with all immediate needs met, spouse at bedside.   Therapy Documentation Precautions:  Precautions Precautions: Fall Recall of Precautions/Restrictions: Impaired Precaution/Restrictions Comments: HOH Restrictions Weight Bearing Restrictions Per Provider Order: Yes RUE Weight Bearing Per Provider Order: Weight bearing as tolerated   Therapy/Group: Individual Therapy  Nereida Habermann, OTR/L, MSOT  02/02/2024, 6:40 AM

## 2024-02-02 NOTE — Progress Notes (Signed)
 Physical Therapy Session Note  Patient Details  Name: Jerry [name] MRN: [id number] Date of Birth: 11-15-1928  Today's Date: 02/02/2024 PT Individual Time: 9195-9141 PT Individual Time Calculation (min): 54 min   Short Term Goals: Week 1:  PT Short Term Goal 1 (Week 1): STG = LTG due to ELOS  Skilled Therapeutic Interventions/Progress Updates:     Pt received semi reclined in bed and agrees to therapy. No complaint of pain. Supine to sit with bed features and cues for sequencing. Pt performs sit to stand with RW and cues for initiation. Pt ambulates to sink to brush teeth, working on balance, activity tolerance, and coordination. Pt then ambulates to toilet with cues for positioning. Following, pt ambulates x150' to gym with RW and cues for upright gaze to improve posture and balance, and increasing stride length to decrease risk for falls. Pt completes Nustep for endurance training. Pt completes x12:00 at workload of 5 with average steps per minute ~50. PT provides cues for hand and foot placement and completing full available ROM. Seated rest break. Pt completes 3x5 sit to stands with arms folded across chest to challenge motor planning, balance, and strength. PT provides cues for correct performance and optimal weight shifting. Pt completes without physical assistance. Pt then performs standing high knee marches to challenge balance and hip flexor strength. Pt completes x20 total with CGA/minA at trunk for stability, with cues for weight shifting and correct performance. Pt ambulates back to room with RW. Left supine in bed with call bell in reach.    Therapy Documentation Precautions:  Precautions Precautions: Fall Recall of Precautions/Restrictions: Impaired Precaution/Restrictions Comments: HOH Restrictions Weight Bearing Restrictions Per Provider Order: Yes RUE Weight Bearing Per Provider Order: Weight bearing as tolerated   Therapy/Group: Individual Therapy  Elsie JAYSON Dawn,  PT, DPT 02/02/2024, 5:04 PM

## 2024-02-02 NOTE — Progress Notes (Signed)
 PROGRESS NOTE   Subjective/Complaints:  No acute complaints.  No events overnight.  Vital stable.  Mild headache this a.m. continues to complain of nocturia. Large bowel movement yesterday.  A.m. labs with uptrending creatinine 1.1, downtrending BUN 22, resolving leukocytosis, stable hemoglobin, mild thrombocytosis. Was drinking over a liter day on acute, only drink 400 cc yesterday per documentation.   ROS: Denies fevers, chills, N/V, abdominal pain, constipation, diarrhea, SOB, cough, chest pain, new weakness or paraesthesias.   + sore throat/cough--ongoing + Nocturia--ongoing  Objective:   No results found. Recent Labs    02/02/24 0527  WBC 8.5  HGB 10.7*  HCT 33.1*  PLT 490*   Recent Labs    02/02/24 0527  NA 139  K 5.0  CL 108  CO2 23  GLUCOSE 110*  BUN 22  CREATININE 1.11  CALCIUM  9.0    Intake/Output Summary (Last 24 hours) at 02/02/2024 1021 Last data filed at 02/01/2024 1854 Gross per 24 hour  Intake 360 ml  Output --  Net 360 ml     Wound 01/29/24 1823 Pressure Injury Buttocks Left Stage 3 -  Full thickness tissue loss. Subcutaneous fat may be visible but bone, tendon or muscle are NOT exposed. (Active)     Wound 01/29/24 1824 Pressure Injury Sacrum Lower Stage 1 -  Intact skin with non-blanchable redness of a localized area usually over a bony prominence. (Active)     Wound 01/30/24 1039 Pressure Injury Heel Left Unstageable - Full thickness tissue loss in which the base of the injury is covered by slough (yellow, tan, gray, green or brown) and/or eschar (tan, brown or black) in the wound bed. (Active)     Wound 01/30/24 1040 Pressure Injury Foot Left;Lateral Unstageable - Full thickness tissue loss in which the base of the injury is covered by slough (yellow, tan, gray, green or brown) and/or eschar (tan, brown or black) in the wound bed. (Active)    Physical Exam: Vital Signs Blood  pressure 120/66, pulse 77, temperature 97.6 F (36.4 C), temperature source Oral, resp. rate 16, height 5' 5 (1.651 m), weight 70.8 kg, SpO2 98%. Constitutional: No apparent distress. Appropriate appearance for age.  Sitting up in therapy gym. HENT: No JVD. Neck Supple. Trachea midline. Atraumatic, normocephalic. Eyes: PERRLA. EOMI. Visual fields grossly intact.  Cardiovascular: RRR, no murmurs/rub/gallops. No Edema. Peripheral pulses 2+  Respiratory: CTAB. No rales, rhonchi, or wheezing. On RA.  Abdomen: + bowel sounds, normoactive. No distention or tenderness.   Skin: C/D/I. No apparent lesions. Sacral DTI, heel wounds as pictured below.          MSK:      No apparent deformity.    Neurologic exam:  Patient is alert, alert, and oriented x 3. More lethargic.    He is hard of hearing.  CN exam non-focal.  MMT: 4+/5 bilateral UE prox to distal. BLE 4-/5 HF, KE and 4/5 ADF/PF.  Sensory exam normal for light touch and pain in all 4 limbs.  No limb ataxia or cerebellar signs.  No abnormal tone appreciated.      Physical exam unchanged from the above on reexamination 02/02/2024  Assessment/Plan: 1. Functional deficits which require 3+ hours per day of interdisciplinary therapy in a comprehensive inpatient rehab setting. Physiatrist is providing close team supervision and 24 hour management of active medical problems listed below. Physiatrist and rehab team continue to assess barriers to discharge/monitor patient progress toward functional and medical goals  Care Tool:  Bathing    Body parts bathed by patient: Right arm, Left arm, Chest, Abdomen, Front perineal area, Right upper leg, Left upper leg, Face, Buttocks, Right lower leg, Left lower leg   Body parts bathed by helper: Right lower leg, Left lower leg     Bathing assist Assist Level: Contact Guard/Touching assist     Upper Body Dressing/Undressing Upper body dressing   What is the patient wearing?: Pull  over shirt    Upper body assist Assist Level: Set up assist    Lower Body Dressing/Undressing Lower body dressing      What is the patient wearing?: Pants, Underwear/pull up     Lower body assist Assist for lower body dressing: Minimal Assistance - Patient > 75%     Toileting Toileting    Toileting assist Assist for toileting: Contact Guard/Touching assist     Transfers Chair/bed transfer  Transfers assist     Chair/bed transfer assist level: Contact Guard/Touching assist     Locomotion Ambulation   Ambulation assist      Assist level: Contact Guard/Touching assist Assistive device: Walker-rolling Max distance: 180'   Walk 10 feet activity   Assist     Assist level: Contact Guard/Touching assist Assistive device: Walker-rolling   Walk 50 feet activity   Assist    Assist level: Contact Guard/Touching assist Assistive device: Walker-rolling    Walk 150 feet activity   Assist    Assist level: Contact Guard/Touching assist Assistive device: Walker-rolling    Walk 10 feet on uneven surface  activity   Assist     Assist level: Minimal Assistance - Patient > 75% Assistive device: Other (comment) (no device)   Wheelchair     Assist Is the patient using a wheelchair?: Yes Type of Wheelchair: Manual    Wheelchair assist level: Dependent - Patient 0%      Wheelchair 50 feet with 2 turns activity    Assist        Assist Level: Dependent - Patient 0%   Wheelchair 150 feet activity     Assist      Assist Level: Dependent - Patient 0%   Blood pressure 120/66, pulse 77, temperature 97.6 F (36.4 C), temperature source Oral, resp. rate 16, height 5' 5 (1.651 m), weight 70.8 kg, SpO2 98%.   Medical Problem List and Plan: 1. Functional deficits secondary to septic shock secondary to acute pyelonephritis and obstructive uropathy with E. coli bacteremia.  Completing course of IV ceftriaxone              -patient may  shower             -ELOS/Goals: 10-14 days, supervision goals with PT, and sup/min with OT - 12/18 DC date   - 12/9: CGA today  Stable to continue inpatient rehab  2.  Antithrombotics: -DVT/anticoagulation/right upper extremity SVT 12/3: Supportive care.  Mechanical: Antiembolism stockings, thigh (TED hose) Bilateral lower extremities             -antiplatelet therapy: Aspirin  81 mg daily and Plavix  75 mg daily 3. Pain Management:  Tylenol  as needed             -denied  pain today  - 12-9: Monitor Tylenol  use due to mild LFT elevation; seems rare, so unlikely contributor.  Likely hypoperfusion with sepsis.  Trend with next labs.  12-11: LFTs downtrending.  Monitor.  4. Mood/Behavior/Sleep: Melatonin 5 mg nightly as needed             -antipsychotic agents: N/A 5. Neuropsych/cognition: This patient is capable of making decisions on his own behalf. 6. Skin/Wound Care/WOC follow-up: Pressure injury to the sacrum  - Offloading for sacrum, heals with Mepilex 7. Fluids/Electrolytes/Nutrition: Routine IN and outs with follow-up chemistries             -decreased nutritional storage--encourage PO, appetite is not great but it's picking up per son  - 12-9: Eating okay, monitor  8.  Bilateral ureteral stones/left ureteral obstruction/urinary urgency.  Status post bilateral ureteral stents 11/29 per Dr.Wrenn.  Foley tube in place             -has persistent hematuria and sediment   - 12-10: DC Foley catheter trial today. Will get UA per family req with first void for lethargy.   12-11: Voiding well, UA with some bacteria and leukocyte esterase, WBCs so we will start Keflex  500 mg twice daily for 7 days and follow cultures.  Timed toileting given urinary frequency  - 12-12: Pending urine culture results.  Voiding well.  Still complaining of severe nocturia, was not on Flomax  prior to admission, DC Flomax  0.4 mg, continue PVRs through the weekend.  9.  Acute metabolic encephalopathy secondary to  sepsis and acute infection.  Resolved with treatment. He is HOH 10.  New onset HFrEF.  Echocardiogram LVEF 35 to 40% with global hypokinesis.  Patient received IV diuresis and completed.  Elevated troponin felt to be related to demand ischemia trending down to 125   - no s/s volume overload Filed Weights   01/29/24 1829  Weight: 70.8 kg   - add daily weight 12/12   11.  Lactic acidosis.  Resolved with management of sepsis 12.  AKI.  Baseline creatinine 0.9.  Creatinine 1.75 with peak of 2.16.  Complicated by mild hydronephrosis from ureteral obstruction.  Creatinine improved AKI resolved.  Follow-up chemistries   - 12/9: labs with stable BUN but mildly uptrending creatinine; encourage p.o. fluids.  - 12-11: BMP with increasing creatinine, downtrending BUN.  Patient drinking significantly less than he was on acute due to urinary frequency, adjusting Flomax  as above, encourage p.o. fluids and monitor Monday.  13.  History of PAD.  Patient manage on DAPT for stent x 2 at SFA/popliteal artery.  Antiplatelets held on admission secondary to sepsis and thrombocytopenia and resumed 01/27/2024 14.  New onset atrial fibrillation.  Follow-up cardiology services.  Continue Toprol -XL 50 mg daily   - Regular rate and rhythm on exam  15.  History of subdural hematoma and received CIR.  Cranial CT scan 11/28 showing no acute abnormality 16.  Hypertension.  Monitor with increased mobility  - Normotensive, vital stable.  Monitor.    02/02/2024    4:17 AM 02/01/2024    7:43 PM 02/01/2024    3:02 PM  Vitals with BMI  Systolic 120 129 891  Diastolic 66 64 60  Pulse 77 78 81     17.  Recent right clavicle fracture.  Advanced to weightbearing as tolerated. 18.  Hyperlipidemia.  Lipitor 19.  Constipation.  MiraLAX  daily, Senokot S1 tablet nightly             -had BM just  before I came to see him today  Last bowel movement 12-9, small  20.  Intractable hiccups--resolved             -dc gabapentin               -trial of low-dose baclofen  5mg  bid. Observe for effect/tolerance 12-9: Hiccups continue, remain bothersome. Start protonix  40 mg daily, monitor on baclofen  today--monitor 12/10: Hiccups resolved; make baclofen  PRN    21. Sore throat. S/p extubation. Add cepacol lozenges PRN  - 12-11: Sore throat improving, with mild cough.  Encouraged incentive spirometer.  No concerning signs on exam.  Consider chest x-ray if no improvement.  12-12: Encouraged use of Cepacol lozenges.  Patient continues to have sore throat, but no other symptoms.  LOS: 4 days A FACE TO FACE EVALUATION WAS PERFORMED  Joesph JAYSON Likes 02/02/2024, 10:21 AM

## 2024-02-03 DIAGNOSIS — I10 Essential (primary) hypertension: Secondary | ICD-10-CM

## 2024-02-03 DIAGNOSIS — I5020 Unspecified systolic (congestive) heart failure: Secondary | ICD-10-CM

## 2024-02-03 DIAGNOSIS — N3000 Acute cystitis without hematuria: Secondary | ICD-10-CM

## 2024-02-03 DIAGNOSIS — A419 Sepsis, unspecified organism: Secondary | ICD-10-CM

## 2024-02-03 DIAGNOSIS — N179 Acute kidney failure, unspecified: Secondary | ICD-10-CM

## 2024-02-03 DIAGNOSIS — K5900 Constipation, unspecified: Secondary | ICD-10-CM

## 2024-02-03 LAB — URINE CULTURE: Culture: 40000 — AB

## 2024-02-03 MED ORDER — DOXYCYCLINE HYCLATE 100 MG PO TABS
100.0000 mg | ORAL_TABLET | Freq: Two times a day (BID) | ORAL | Status: AC
  Administered 2024-02-03 – 2024-02-08 (×10): 100 mg via ORAL
  Filled 2024-02-03 (×10): qty 1

## 2024-02-03 NOTE — Progress Notes (Signed)
 PROGRESS NOTE   Subjective/Complaints: Patient reports he has not had a bowel movement in a couple days although his spouse thought he had 1 this morning.  Documentation indicates 12/11.  He is not interested in the laxatives, wants to try prune juice first.  ROS: Denies fevers, chills, N/V, abdominal pain,  diarrhea, SOB, cough, chest pain, new weakness or paraesthesias.   + sore throat/cough--unchanged + Nocturia--ongoing + Mild constipation  Objective:   No results found. Recent Labs    02/02/24 0527  WBC 8.5  HGB 10.7*  HCT 33.1*  PLT 490*   Recent Labs    02/02/24 0527  NA 139  K 5.0  CL 108  CO2 23  GLUCOSE 110*  BUN 22  CREATININE 1.11  CALCIUM  9.0    Intake/Output Summary (Last 24 hours) at 02/03/2024 1531 Last data filed at 02/03/2024 1300 Gross per 24 hour  Intake 358 ml  Output 400 ml  Net -42 ml     Wound 01/29/24 1823 Pressure Injury Buttocks Left Stage 3 -  Full thickness tissue loss. Subcutaneous fat may be visible but bone, tendon or muscle are NOT exposed. (Active)     Wound 01/29/24 1824 Pressure Injury Sacrum Lower Stage 1 -  Intact skin with non-blanchable redness of a localized area usually over a bony prominence. (Active)     Wound 01/30/24 1039 Pressure Injury Heel Left Unstageable - Full thickness tissue loss in which the base of the injury is covered by slough (yellow, tan, gray, green or brown) and/or eschar (tan, brown or black) in the wound bed. (Active)     Wound 01/30/24 1040 Pressure Injury Foot Left;Lateral Unstageable - Full thickness tissue loss in which the base of the injury is covered by slough (yellow, tan, gray, green or brown) and/or eschar (tan, brown or black) in the wound bed. (Active)    Physical Exam: Vital Signs Blood pressure (!) 108/54, pulse 74, temperature 97.8 F (36.6 C), temperature source Oral, resp. rate 20, height 5' 5 (1.651 m), weight 70.8 kg, SpO2  97%. Constitutional: No apparent distress. Appropriate appearance for age.  Sitting up in wheelchair in his room. HENT: No JVD. Neck Supple. Trachea midline. Atraumatic, normocephalic. Eyes: PERRLA. EOMI. Visual fields grossly intact.  Cardiovascular: RRR, no murmurs/rub/gallops. No Edema. Peripheral pulses 2+  Respiratory: CTAB. No rales, rhonchi, or wheezing. On RA.  Abdomen: + bowel sounds, normoactive. No distention or tenderness.   Skin: C/D/I. No apparent lesions. Sacral DTI, heel wounds as pictured below.  Images from 12/09          MSK:      No apparent deformity.    Neurologic exam:  Patient is alert, alert, and oriented x 3.  He is hard of hearing.  CN 2 through 12 grossly intact MMT: 4+/5 bilateral UE prox to distal. BLE 4-/5 HF, KE and 4/5 ADF/PF.  Sensory exam normal for light touch and pain in all 4 limbs.  No limb ataxia or cerebellar signs.  No abnormal tone appreciated.      Physical exam unchanged from the above on reexamination 02/03/2024       Assessment/Plan: 1. Functional deficits which require 3+ hours  per day of interdisciplinary therapy in a comprehensive inpatient rehab setting. Physiatrist is providing close team supervision and 24 hour management of active medical problems listed below. Physiatrist and rehab team continue to assess barriers to discharge/monitor patient progress toward functional and medical goals  Care Tool:  Bathing    Body parts bathed by patient: Right arm, Left arm, Chest, Abdomen, Front perineal area, Right upper leg, Left upper leg, Face, Buttocks, Right lower leg, Left lower leg   Body parts bathed by helper: Right lower leg, Left lower leg     Bathing assist Assist Level: Contact Guard/Touching assist     Upper Body Dressing/Undressing Upper body dressing   What is the patient wearing?: Pull over shirt    Upper body assist Assist Level: Set up assist    Lower Body Dressing/Undressing Lower body  dressing      What is the patient wearing?: Pants, Underwear/pull up     Lower body assist Assist for lower body dressing: Minimal Assistance - Patient > 75%     Toileting Toileting    Toileting assist Assist for toileting: Contact Guard/Touching assist     Transfers Chair/bed transfer  Transfers assist     Chair/bed transfer assist level: Contact Guard/Touching assist     Locomotion Ambulation   Ambulation assist      Assist level: Contact Guard/Touching assist Assistive device: Walker-rolling Max distance: 180'   Walk 10 feet activity   Assist     Assist level: Contact Guard/Touching assist Assistive device: Walker-rolling   Walk 50 feet activity   Assist    Assist level: Contact Guard/Touching assist Assistive device: Walker-rolling    Walk 150 feet activity   Assist    Assist level: Contact Guard/Touching assist Assistive device: Walker-rolling    Walk 10 feet on uneven surface  activity   Assist     Assist level: Minimal Assistance - Patient > 75% Assistive device: Other (comment) (no device)   Wheelchair     Assist Is the patient using a wheelchair?: Yes Type of Wheelchair: Manual    Wheelchair assist level: Dependent - Patient 0%      Wheelchair 50 feet with 2 turns activity    Assist        Assist Level: Dependent - Patient 0%   Wheelchair 150 feet activity     Assist      Assist Level: Dependent - Patient 0%   Blood pressure (!) 108/54, pulse 74, temperature 97.8 F (36.6 C), temperature source Oral, resp. rate 20, height 5' 5 (1.651 m), weight 70.8 kg, SpO2 97%.   Medical Problem List and Plan: 1. Functional deficits secondary to septic shock secondary to acute pyelonephritis and obstructive uropathy with E. coli bacteremia.  Completing course of IV ceftriaxone              -patient may shower             -ELOS/Goals: 10-14 days, supervision goals with PT, and sup/min with OT - 12/18 DC  date   - 12/9: CGA today  Stable to continue inpatient rehab  2.  Antithrombotics: -DVT/anticoagulation/right upper extremity SVT 12/3: Supportive care.  Mechanical: Antiembolism stockings, thigh (TED hose) Bilateral lower extremities             -antiplatelet therapy: Aspirin  81 mg daily and Plavix  75 mg daily 3. Pain Management:  Tylenol  as needed             -denied pain today  - 12-9: Monitor Tylenol   use due to mild LFT elevation; seems rare, so unlikely contributor.  Likely hypoperfusion with sepsis.  Trend with next labs.  12-11: LFTs downtrending.  Monitor.  4. Mood/Behavior/Sleep: Melatonin 5 mg nightly as needed             -antipsychotic agents: N/A 5. Neuropsych/cognition: This patient is capable of making decisions on his own behalf. 6. Skin/Wound Care/WOC follow-up: Pressure injury to the sacrum  - Offloading for sacrum, heals with Mepilex 7. Fluids/Electrolytes/Nutrition: Routine IN and outs with follow-up chemistries             -decreased nutritional storage--encourage PO, appetite is not great but it's picking up per son  - 12-9: Eating okay, monitor  -12/13 eating 80 to 100% of his meals continue to monitor  8.  Bilateral ureteral stones/left ureteral obstruction/urinary urgency.  Status post bilateral ureteral stents 11/29 per Dr.Wrenn.  Foley tube in place             -has persistent hematuria and sediment   - 12-10: DC Foley catheter trial today. Will get UA per family req with first void for lethargy.   12-11: Voiding well, UA with some bacteria and leukocyte esterase, WBCs so we will start Keflex  500 mg twice daily for 7 days and follow cultures.  Timed toileting given urinary frequency  - 12-12: Pending urine culture results.  Voiding well.  Still complaining of severe nocturia, was not on Flomax  prior to admission, DC Flomax  0.4 mg, continue PVRs through the weekend.  -12/13 urine culture with Staph epidermidis, discussed with pharmacy antibiotic changed to  doxycycline  100 mg twice daily  9.  Acute metabolic encephalopathy secondary to sepsis and acute infection.  Resolved with treatment. He is HOH 10.  New onset HFrEF.  Echocardiogram LVEF 35 to 40% with global hypokinesis.  Patient received IV diuresis and completed.  Elevated troponin felt to be related to demand ischemia trending down to 125   - no s/s volume overload Filed Weights   01/29/24 1829  Weight: 70.8 kg   - add daily weight 12/12  - Repeat weight pending, no signs of fluid overload noted   11.  Lactic acidosis.  Resolved with management of sepsis 12.  AKI.  Baseline creatinine 0.9.  Creatinine 1.75 with peak of 2.16.  Complicated by mild hydronephrosis from ureteral obstruction.  Creatinine improved AKI resolved.  Follow-up chemistries   - 12/9: labs with stable BUN but mildly uptrending creatinine; encourage p.o. fluids.  - 12-11: BMP with increasing creatinine, downtrending BUN.  Patient drinking significantly less than he was on acute due to urinary frequency, adjusting Flomax  as above, encourage p.o. fluids and monitor Monday.  -12/13 continue encourage fluid intake  13.  History of PAD.  Patient manage on DAPT for stent x 2 at SFA/popliteal artery.  Antiplatelets held on admission secondary to sepsis and thrombocytopenia and resumed 01/27/2024 14.  New onset atrial fibrillation.  Follow-up cardiology services.  Continue Toprol -XL 50 mg daily   - Regular rate and rhythm on exam  15.  History of subdural hematoma and received CIR.  Cranial CT scan 11/28 showing no acute abnormality 16.  Hypertension.  Monitor with increased mobility  - Normotensive, vital stable.  Monitor.  -12/13 BP a little soft but overall controlled continue to monitor trend    02/03/2024    1:41 PM 02/03/2024    4:42 AM 02/02/2024    7:25 PM  Vitals with BMI  Systolic 108 126 99  Diastolic 54  66 81  Pulse 74 73 79     17.  Recent right clavicle fracture.  Advanced to weightbearing as  tolerated. 18.  Hyperlipidemia.  Lipitor 19.  Constipation.  MiraLAX  daily, Senokot S1 tablet nightly             -had BM just before I came to see him today  LBM 12/11 large, patient declines laxatives for now wants to try prune juice  20.  Intractable hiccups--resolved             -dc gabapentin              -trial of low-dose baclofen  5mg  bid. Observe for effect/tolerance 12-9: Hiccups continue, remain bothersome. Start protonix  40 mg daily, monitor on baclofen  today--monitor 12/10: Hiccups resolved; make baclofen  PRN    21. Sore throat. S/p extubation. Add cepacol lozenges PRN  - 12-11: Sore throat improving, with mild cough.  Encouraged incentive spirometer.  No concerning signs on exam.  Consider chest x-ray if no improvement.  12-12: Encouraged use of Cepacol lozenges.  Patient continues to have sore throat, but no other symptoms.  LOS: 5 days A FACE TO FACE EVALUATION WAS PERFORMED  Murray Collier 02/03/2024, 3:31 PM

## 2024-02-03 NOTE — Plan of Care (Signed)
°  Problem: RH BOWEL ELIMINATION Goal: RH STG MANAGE BOWEL WITH ASSISTANCE Description: STG Manage Bowel with minimal  Assistance. Outcome: Progressing   Problem: RH BLADDER ELIMINATION Goal: RH STG MANAGE BLADDER WITH ASSISTANCE Description: STG Manage Bladder With min Assistance Outcome: Progressing   Problem: RH SKIN INTEGRITY Goal: RH STG SKIN FREE OF INFECTION/BREAKDOWN Description: [place] skin free of infection with min assistance Outcome: Progressing   Problem: RH SAFETY Goal: RH STG ADHERE TO SAFETY PRECAUTIONS W/ASSISTANCE/DEVICE Description: STG Adhere to Safety Precautions With min Assistance/Device. Outcome: Progressing   Problem: RH PAIN MANAGEMENT Goal: RH STG PAIN MANAGED AT OR BELOW PT'S PAIN GOAL Description: <4 w/ prns Outcome: Progressing   Problem: RH KNOWLEDGE DEFICIT GENERAL Goal: RH STG INCREASE KNOWLEDGE OF SELF CARE AFTER HOSPITALIZATION Description: Manage increase knowledge of self care after hospitalization with min assistance from spouse using educational materials provided Outcome: Progressing

## 2024-02-04 DIAGNOSIS — R351 Nocturia: Secondary | ICD-10-CM

## 2024-02-04 DIAGNOSIS — R7989 Other specified abnormal findings of blood chemistry: Secondary | ICD-10-CM

## 2024-02-04 LAB — GLUCOSE, CAPILLARY: Glucose-Capillary: 133 mg/dL — ABNORMAL HIGH (ref 70–99)

## 2024-02-04 NOTE — Progress Notes (Signed)
 Physical Therapy Session Note  Patient Details  Name: Jerry [name] MRN: [id number] Date of Birth: 13-Oct-1928  {CHL IP REHAB PT TIME CALCULATION:[id number]}  Short Term Goals: Week 1:  PT Short Term Goal 1 (Week 1): STG = LTG due to ELOS  Skilled Therapeutic Interventions/Progress Updates:      Pt seated in WC upon arrival.  Pt agreeable to therapy. Pt denies any pain.   Pt performed sit to stand x10 with RW and CGA, verbal cues provided for UE positioning.   Pt ambulated loop around day room, and day room to room with RW and CGA, verbal cues provided for R LE clearance with emphasis on heel/toe and proximity with RW especially with fatigue.   Pt performed 1x10 toe taps B LE to 6 inch step with no AD and R HHA, verbal cues provided for increased clearance and light taps.   Pt endorses having a RW, cane, and rollator at home. Pt endorses utilizing rollator at baseline and plan to use rollator upon discharge. Trailed rollator during session. Pt ambulated loop around day room with rollator and CGA/clsoe supervision. Pt overall demonstrates same gait pattern and safety. Pt reports preference for RW after trial and requesting to use RW.   Sit to supine with use of bed rails and supervision.   Pt supine in bed with all needs within reach and bed alarm on.   Therapy Documentation Precautions:  Precautions Precautions: Fall Recall of Precautions/Restrictions: Impaired Precaution/Restrictions Comments: HOH Restrictions Weight Bearing Restrictions Per Provider Order: Yes RUE Weight Bearing Per Provider Order: Weight bearing as tolerated  Therapy/Group: Individual Therapy  [facility] Doreene Orris, [place], DPT  02/04/2024, 7:49 AM

## 2024-02-04 NOTE — Progress Notes (Signed)
 Physical Therapy Session Note  Patient Details  Name: Jerry [name] MRN: [id number] Date of Birth: 07/16/1928  Today's Date: 02/04/2024 PT Individual Time: 1015-1055  PT Individual Time Calculation (min): 40 min  Short Term Goals: Week 1:  PT Short Term Goal 1 (Week 1): STG = LTG due to ELOS  Skilled Therapeutic Interventions/Progress Updates:  Chart reviewed and pt agreeable to therapy. Pt received seated in WC with 0/10 c/o pain. Session focused on functional transfers, ambulation, and stair navigation to promote safe home mobility and access. Pt taken to therapy gym for time management. Pt initiated session with amb of 136ft + 128ft using CGA + RW fading to S + RW. Pt noted to have improved foot clearance with increased amb time but tendency to push RW forward. RW adjusted for appropriate height, and pt able to amb with RW in correct placement. Pt then completed 8 steps using S + B rails per home set up. Pt then completed long amb of 559ft using S + RW and no rest breaks. In room, pt required S to return to bed. Session education emphasized CIR fall prevention policies. At end of session, pt was left semi-reclined in bed with alarm engaged, nurse call bell and all needs in reach.     Therapy Documentation Precautions:  Precautions Precautions: Fall Recall of Precautions/Restrictions: Impaired Precaution/Restrictions Comments: HOH Restrictions Weight Bearing Restrictions Per Provider Order: Yes RUE Weight Bearing Per Provider Order: Weight bearing as tolerated General:      Therapy/Group: Individual Therapy   Jerry [name] 02/04/2024, 12:01 PM

## 2024-02-04 NOTE — Progress Notes (Signed)
 PROGRESS NOTE   Subjective/Complaints: Reports he continues to have frequent urination at night.  Reports he feels little worn out from therapy.  ROS: Denies fevers, chills, N/V, abdominal pain,  diarrhea, SOB, cough, chest pain, new weakness or paraesthesias.   + sore throat/cough--gradually improving + Nocturia--ongoing + Mild constipation  Objective:   No results found. Recent Labs    02/02/24 0527  WBC 8.5  HGB 10.7*  HCT 33.1*  PLT 490*   Recent Labs    02/02/24 0527  NA 139  K 5.0  CL 108  CO2 23  GLUCOSE 110*  BUN 22  CREATININE 1.11  CALCIUM  9.0    Intake/Output Summary (Last 24 hours) at 02/04/2024 1552 Last data filed at 02/04/2024 1325 Gross per 24 hour  Intake 358 ml  Output 400 ml  Net -42 ml     Wound 01/29/24 1823 Pressure Injury Buttocks Left Stage 3 -  Full thickness tissue loss. Subcutaneous fat may be visible but bone, tendon or muscle are NOT exposed. (Active)     Wound 01/29/24 1824 Pressure Injury Sacrum Lower Stage 1 -  Intact skin with non-blanchable redness of a localized area usually over a bony prominence. (Active)     Wound 01/30/24 1039 Pressure Injury Heel Left Unstageable - Full thickness tissue loss in which the base of the injury is covered by slough (yellow, tan, gray, green or brown) and/or eschar (tan, brown or black) in the wound bed. (Active)     Wound 01/30/24 1040 Pressure Injury Foot Left;Lateral Unstageable - Full thickness tissue loss in which the base of the injury is covered by slough (yellow, tan, gray, green or brown) and/or eschar (tan, brown or black) in the wound bed. (Active)    Physical Exam: Vital Signs Blood pressure (!) 116/50, pulse 71, temperature 98.2 F (36.8 C), resp. rate 18, height 5' 5 (1.651 m), weight 70.8 kg, SpO2 100%. Constitutional: No apparent distress. Appropriate appearance for age.  Laying in bed, daughter at bedside HENT: No JVD.  Neck Supple. Trachea midline. Atraumatic, normocephalic. Eyes: PERRLA. EOMI. Visual fields grossly intact.  Cardiovascular: RRR, no murmurs/rub/gallops. No Edema. Peripheral pulses 2+  Respiratory: CTAB. No rales, rhonchi, or wheezing. On RA.  Abdomen: + bowel sounds, normoactive. No distention or tenderness.   Skin: C/D/I. No apparent lesions. Sacral DTI, heel wounds as pictured below.  Images from 12/09          MSK:      No apparent deformity.    Neurologic exam:  Patient is alert, alert, and oriented x 3.  He is hard of hearing.  CN 2 through 12 grossly intact MMT: 4+/5 bilateral UE prox to distal. BLE 4-/5 HF, KE and 4/5 ADF/PF.  Sensory exam normal for light touch and pain in all 4 limbs.  No limb ataxia or cerebellar signs.  No abnormal tone appreciated.      Physical exam unchanged from the above on reexamination 02/04/2024       Assessment/Plan: 1. Functional deficits which require 3+ hours per day of interdisciplinary therapy in a comprehensive inpatient rehab setting. Physiatrist is providing close team supervision and 24 hour management of active medical problems  listed below. Physiatrist and rehab team continue to assess barriers to discharge/monitor patient progress toward functional and medical goals  Care Tool:  Bathing    Body parts bathed by patient: Right arm, Left arm, Chest, Abdomen, Front perineal area, Right upper leg, Left upper leg, Face, Buttocks, Right lower leg, Left lower leg   Body parts bathed by helper: Right lower leg, Left lower leg     Bathing assist Assist Level: Contact Guard/Touching assist     Upper Body Dressing/Undressing Upper body dressing   What is the patient wearing?: Pull over shirt    Upper body assist Assist Level: Set up assist    Lower Body Dressing/Undressing Lower body dressing      What is the patient wearing?: Pants, Underwear/pull up     Lower body assist Assist for lower body dressing: Minimal  Assistance - Patient > 75%     Toileting Toileting    Toileting assist Assist for toileting: Contact Guard/Touching assist     Transfers Chair/bed transfer  Transfers assist     Chair/bed transfer assist level: Contact Guard/Touching assist     Locomotion Ambulation   Ambulation assist      Assist level: Contact Guard/Touching assist Assistive device: Walker-rolling Max distance: 180'   Walk 10 feet activity   Assist     Assist level: Contact Guard/Touching assist Assistive device: Walker-rolling   Walk 50 feet activity   Assist    Assist level: Contact Guard/Touching assist Assistive device: Walker-rolling    Walk 150 feet activity   Assist    Assist level: Contact Guard/Touching assist Assistive device: Walker-rolling    Walk 10 feet on uneven surface  activity   Assist     Assist level: Minimal Assistance - Patient > 75% Assistive device: Other (comment) (no device)   Wheelchair     Assist Is the patient using a wheelchair?: Yes Type of Wheelchair: Manual    Wheelchair assist level: Dependent - Patient 0%      Wheelchair 50 feet with 2 turns activity    Assist        Assist Level: Dependent - Patient 0%   Wheelchair 150 feet activity     Assist      Assist Level: Dependent - Patient 0%   Blood pressure (!) 116/50, pulse 71, temperature 98.2 F (36.8 C), resp. rate 18, height 5' 5 (1.651 m), weight 70.8 kg, SpO2 100%.   Medical Problem List and Plan: 1. Functional deficits secondary to septic shock secondary to acute pyelonephritis and obstructive uropathy with E. coli bacteremia.  Completing course of IV ceftriaxone              -patient may shower             -ELOS/Goals: 10-14 days, supervision goals with PT, and sup/min with OT - 12/18 DC date   - 12/9: CGA today  Stable to continue inpatient rehab  2.  Antithrombotics: -DVT/anticoagulation/right upper extremity SVT 12/3: Supportive care.   Mechanical: Antiembolism stockings, thigh (TED hose) Bilateral lower extremities             -antiplatelet therapy: Aspirin  81 mg daily and Plavix  75 mg daily 3. Pain Management:  Tylenol  as needed             -denied pain today  - 12-9: Monitor Tylenol  use due to mild LFT elevation; seems rare, so unlikely contributor.  Likely hypoperfusion with sepsis.  Trend with next labs.  12-11: LFTs downtrending.  Monitor.  Recheck LFT tomorrow ordered  4. Mood/Behavior/Sleep: Melatonin 5 mg nightly as needed             -antipsychotic agents: N/A 5. Neuropsych/cognition: This patient is capable of making decisions on his own behalf. 6. Skin/Wound Care/WOC follow-up: Pressure injury to the sacrum  - Offloading for sacrum, heals with Mepilex 7. Fluids/Electrolytes/Nutrition: Routine IN and outs with follow-up chemistries             -decreased nutritional storage--encourage PO, appetite is not great but it's picking up per son  - 12-9: Eating okay, monitor  -12/14 eating most of his meals, continue to monitor  8.  Bilateral ureteral stones/left ureteral obstruction/urinary urgency.  Status post bilateral ureteral stents 11/29 per Dr.Wrenn.  Foley tube in place             -has persistent hematuria and sediment   - 12-10: DC Foley catheter trial today. Will get UA per family req with first void for lethargy.   12-11: Voiding well, UA with some bacteria and leukocyte esterase, WBCs so we will start Keflex  500 mg twice daily for 7 days and follow cultures.  Timed toileting given urinary frequency  - 12-12: Pending urine culture results.  Voiding well.  Still complaining of severe nocturia, was not on Flomax  prior to admission, DC Flomax  0.4 mg, continue PVRs through the weekend.  -12/13 urine culture with Staph epidermidis, discussed with pharmacy antibiotic changed to doxycycline  100 mg twice daily  -12/14 Recheck CBC tomorrow, continues to have increased urination frequency at night, does not appear to  have recent PVRs completed discussed with nursing who will check this  9.  Acute metabolic encephalopathy secondary to sepsis and acute infection.  Resolved with treatment. He is HOH 10.  New onset HFrEF.  Echocardiogram LVEF 35 to 40% with global hypokinesis.  Patient received IV diuresis and completed.  Elevated troponin felt to be related to demand ischemia trending down to 125   - no s/s volume overload Filed Weights   01/29/24 1829  Weight: 70.8 kg   - add daily weight 12/12  - Repeat weight pending-will asked nursing to complete, no signs of fluid overload noted   11.  Lactic acidosis.  Resolved with management of sepsis 12.  AKI.  Baseline creatinine 0.9.  Creatinine 1.75 with peak of 2.16.  Complicated by mild hydronephrosis from ureteral obstruction.  Creatinine improved AKI resolved.  Follow-up chemistries   - 12/9: labs with stable BUN but mildly uptrending creatinine; encourage p.o. fluids.  - 12-11: BMP with increasing creatinine, downtrending BUN.  Patient drinking significantly less than he was on acute due to urinary frequency, adjusting Flomax  as above, encourage p.o. fluids and monitor Monday.  -12/13 continue encourage fluid intake  BMP for tomorrow ordered  13.  History of PAD.  Patient manage on DAPT for stent x 2 at SFA/popliteal artery.  Antiplatelets held on admission secondary to sepsis and thrombocytopenia and resumed 01/27/2024 14.  New onset atrial fibrillation.  Follow-up cardiology services.  Continue Toprol -XL 50 mg daily   - Regular rate and rhythm on exam  15.  History of subdural hematoma and received CIR.  Cranial CT scan 11/28 showing no acute abnormality 16.  Hypertension.  Monitor with increased mobility  - Normotensive, vital stable.  Monitor.  -12/13-14 BP a little soft but overall controlled continue to monitor trend    02/04/2024    2:02 PM 02/04/2024    4:09 AM 02/03/2024   10:12 PM  Vitals with BMI  Systolic 116 130 871  Diastolic 50 57 59   Pulse 71 74 80     17.  Recent right clavicle fracture.  Advanced to weightbearing as tolerated. 18.  Hyperlipidemia.  Lipitor 19.  Constipation.  MiraLAX  daily, Senokot S1 tablet nightly             -had BM just before I came to see him today  LBM 12/13   20.  Intractable hiccups--resolved             -dc gabapentin              -trial of low-dose baclofen  5mg  bid. Observe for effect/tolerance 12-9: Hiccups continue, remain bothersome. Start protonix  40 mg daily, monitor on baclofen  today--monitor 12/10: Hiccups resolved; make baclofen  PRN    21. Sore throat. S/p extubation. Add cepacol lozenges PRN  - 12-11: Sore throat improving, with mild cough.  Encouraged incentive spirometer.  No concerning signs on exam.  Consider chest x-ray if no improvement.  12-12: Encouraged use of Cepacol lozenges.  Patient continues to have sore throat, but no other symptoms.  -12/14 reports Improving  LOS: 6 days A FACE TO FACE EVALUATION WAS PERFORMED  Murray Collier 02/04/2024, 3:52 PM

## 2024-02-04 NOTE — Progress Notes (Signed)
 Occupational Therapy Session Note  Patient Details  Name: Jerry [name] MRN: [id number] Date of Birth: February 19, 1929  Today's Date: 02/04/2024 OT Individual Time: 0100-0145 OT Individual Time Calculation (min): 45 min    Short Term Goals: Week 1:  OT Short Term Goal 1 (Week 1): STGs=LTGs due to patient's ELOS.  Skilled Therapeutic Interventions/Progress Updates:    The pt seated at w/c LOF at the time of arrival with family present. The pt was in agreement with working on sit to stands, simulated task in LB dressing using theraband and UB strength. The pt was able to complete 4 sit to stands using the RW and the arm of the w/c with close S and vc's for self correcting posture and the position of his feet to minimize his risk for fall.. The pt went on to complete LB dressing using a theraband for  simulating LB dressing  4x with rest breaks as needs.  The pt was instructed to pick his feet up and to incorporate AE for picking up items from the floor. The pt also required v/c's for maintain good anatomical position when coming into stand to reduce his risk for falls. The pt went on to complete UB exercise using a 1lb dowel, 1 set of 10 for shld flexion, horizontal abduction, shld rotation, and lg circles with rest breaks as needed, the patient required 3 rest breaks. The pt went on to complete the UB cycle. At the end of the session, the pt remained at w/c LOF with all additional needs address and the call light within reach.   Therapy Documentation Precautions:  Precautions Precautions: Fall Recall of Precautions/Restrictions: Impaired Precaution/Restrictions Comments: HOH Restrictions Weight Bearing Restrictions Per Provider Order: Yes RUE Weight Bearing Per Provider Order: Weight bearing as tolerated  Therapy/Group: Individual Therapy  Elvera JONETTA Mace 02/04/2024, 4:08 PM

## 2024-02-04 NOTE — Progress Notes (Signed)
 Occupational Therapy Session Note  Patient Details  Name: Jerry [name] MRN: [id number] Date of Birth: 06/05/1928  Today's Date: 02/04/2024 OT Individual Time: 0800-0900 OT Individual Time Calculation (min): 60 min    Short Term Goals: Week 1:  OT Short Term Goal 1 (Week 1): STGs=LTGs due to patient's ELOS.  Skilled Therapeutic Interventions/Progress Updates:    The patient  was in the restroom with nursing at the time of arrival. The patient reported not resting well during the night secondary to constant voiding. The pt had no pain to report at the time of treatment. The pt was able to void and donn his LB garments with close S. The pt was able to come from sit to stand for transferring to the w/c by incorporating the grab bars and the arm of the w/c with closeS. The pt was transported to the sink and was in agreement with completing BADL related task for starting his day.  The pt was able to wash his hands,  brush his teeth and wash his face with s/uA. The pt was able to doff his over head shirt with closeS ,he was able to  doff his sweater by managing buttons with closeS. The pt was able to bathe his UB with s/uA. The pt was able to come from sit to stand for washing his bottom with closeS. The patient was able to return to sit using the sink and the arm of the w/c for placement at closeS. The pt was able to donn his briefs and pants using opposite arm, opposite leg for > AROM for threading his feet and legs through the opening of the pants with CGA  and vc's. The pt was received vc's  for good anatomical positioning when coming into stand for donning the clothing items over his hips and bottom. The pt was able to demonstrate effective carryover.  At the  end of the session, the pt remained at w/c LOF with all additional needs addressed prior to me exiting the room.   Therapy Documentation Precautions:  Precautions Precautions: Fall Recall of Precautions/Restrictions:  Impaired Precaution/Restrictions Comments: HOH Restrictions Weight Bearing Restrictions Per Provider Order: Yes RUE Weight Bearing Per Provider Order: Weight bearing as tolerated   Therapy/Group: Individual Therapy  Elvera JONETTA Mace 02/04/2024, 12:33 PM

## 2024-02-04 NOTE — Plan of Care (Signed)
°  Problem: RH BOWEL ELIMINATION Goal: RH STG MANAGE BOWEL WITH ASSISTANCE Description: STG Manage Bowel with minimal  Assistance. Outcome: Progressing   Problem: RH BLADDER ELIMINATION Goal: RH STG MANAGE BLADDER WITH ASSISTANCE Description: STG Manage Bladder With min Assistance Outcome: Progressing   Problem: RH SKIN INTEGRITY Goal: RH STG SKIN FREE OF INFECTION/BREAKDOWN Description: [place] skin free of infection with min assistance Outcome: Progressing   Problem: RH SAFETY Goal: RH STG ADHERE TO SAFETY PRECAUTIONS W/ASSISTANCE/DEVICE Description: STG Adhere to Safety Precautions With min Assistance/Device. Outcome: Progressing   Problem: RH PAIN MANAGEMENT Goal: RH STG PAIN MANAGED AT OR BELOW PT'S PAIN GOAL Description: <4 w/ prns Outcome: Progressing   Problem: RH KNOWLEDGE DEFICIT GENERAL Goal: RH STG INCREASE KNOWLEDGE OF SELF CARE AFTER HOSPITALIZATION Description: Manage increase knowledge of self care after hospitalization with min assistance from spouse using educational materials provided Outcome: Progressing

## 2024-02-05 DIAGNOSIS — R4189 Other symptoms and signs involving cognitive functions and awareness: Secondary | ICD-10-CM

## 2024-02-05 LAB — CBC
HCT: 36 % — ABNORMAL LOW (ref 39.0–52.0)
Hemoglobin: 11.4 g/dL — ABNORMAL LOW (ref 13.0–17.0)
MCH: 30.6 pg (ref 26.0–34.0)
MCHC: 31.7 g/dL (ref 30.0–36.0)
MCV: 96.5 fL (ref 80.0–100.0)
Platelets: 342 K/uL (ref 150–400)
RBC: 3.73 MIL/uL — ABNORMAL LOW (ref 4.22–5.81)
RDW: 14.8 % (ref 11.5–15.5)
WBC: 7.3 K/uL (ref 4.0–10.5)
nRBC: 0 % (ref 0.0–0.2)

## 2024-02-05 LAB — COMPREHENSIVE METABOLIC PANEL WITH GFR
ALT: 46 U/L — ABNORMAL HIGH (ref 0–44)
AST: 44 U/L — ABNORMAL HIGH (ref 15–41)
Albumin: 2.5 g/dL — ABNORMAL LOW (ref 3.5–5.0)
Alkaline Phosphatase: 179 U/L — ABNORMAL HIGH (ref 38–126)
Anion gap: 6 (ref 5–15)
BUN: 31 mg/dL — ABNORMAL HIGH (ref 8–23)
CO2: 24 mmol/L (ref 22–32)
Calcium: 8.8 mg/dL — ABNORMAL LOW (ref 8.9–10.3)
Chloride: 106 mmol/L (ref 98–111)
Creatinine, Ser: 1.38 mg/dL — ABNORMAL HIGH (ref 0.61–1.24)
GFR, Estimated: 48 mL/min — ABNORMAL LOW (ref >60)
Glucose, Bld: 96 mg/dL (ref 70–99)
Potassium: 4.6 mmol/L (ref 3.5–5.1)
Sodium: 136 mmol/L (ref 135–145)
Total Bilirubin: 0.8 mg/dL (ref 0.0–1.2)
Total Protein: 5.4 g/dL — ABNORMAL LOW (ref 6.5–8.1)

## 2024-02-05 MED ORDER — POLYETHYLENE GLYCOL 3350 17 G PO PACK: 17.0000 g | PACK | Freq: Every day | ORAL | Status: DC | PRN

## 2024-02-05 MED ORDER — SODIUM CHLORIDE 0.9 % IV SOLN: INTRAVENOUS | Status: AC

## 2024-02-05 NOTE — Telephone Encounter (Signed)
 Shona, surgery scheduler called me back and we reviewed notes. The 01/19/24 procedure was stents only.   The procedure to be cleared is  Surgeon Marily Watt Rush, MD Primary   Procedure Laterality Anesthesia  CYSTOSCOPY/URETEROSCOPY/HOLMIUM LASER/STENT PLACEMENT Bilateral General      The pt will be having the stone blasted and stent exchange 03/12/24. I thanked Shona for the help and that I will have the preop APP review the notes now for preop clearance.

## 2024-02-05 NOTE — Consult Note (Signed)
 Neuropsychological Consultation Comprehensive Inpatient Rehab   Patient:   Jerry [name]   DOB:   [birth year]  MR Number:  [id number]  Location:  [facility] [place] [facility] [address] [place] [address] Dept: [phone] Loc: [phone]           Date of Service:   02/05/2024  Start Time:   9:45 AM End Time:   10:45 AM  Provider/Observer:  Norleen Asa, Psy.D.       Clinical Neuropsychologist       Billing Code/Service: [phone]  Reason for Service:    TAVARION BABINGTON is a 89 year old right-handed male referred for neuropsychological consultation during his readmission/current admission to the comprehensive inpatient rehabilitation unit.  Patient had been on the unit back in October and I saw him there post concussive events/TBI.    Presented 01/19/2024 after being found down following a loud noise in the bathroom. Noted to have altered mental status. In the ED, tachycardic and hypotensive with fever of 103. Cranial CT scan negative for acute intracranial abnormality, skull fracture, mass effect, or midline shift. CT cervical spine negative.  CT of chest, abdomen, and pelvis showed a 2 mm calculus in the mid-left ureter with mild obstructive uropathy. Multiple healing/healed bilateral rib fractures, with a question of acute nondisplaced anterior right second rib fracture. Age-indeterminate posterior left 10th rib fracture. Stable abdominal aortic aneurysm measuring 4 cm. 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 Admission chemistries: CO2 16, glucose 120, creatinine 1.75, AST 54, WBC 14,900, hemoglobin 12.5, platelets 103,000, blood cultures positive for E. coli, BNP 292.3, lactic acid 5.1-7.0, troponin elevated 147-125.  Patient received broad-spectrum antibiotics (vancomycin  and Zosyn ) for sepsis, 2 L LR boluses, DuoNeb, and Solu-Medrol . Urology consulted for bilateral  ureteral stones with left obstruction, underwent cystoscopy with bilateral retrograde pyelography and bilateral ureteral stents on 01/20/2024. Hospital course significant for septic shock secondary to acute pyelonephritis and obstructive uropathy with E. coli bacteremia. Initially required ICU admission, started on dobutamine , vasopressin , phenylephrine , and norepinephrine . Vasopressors weaned off with infection treatment. Empiric vancomycin  and Zosyn  transitioned to ceftriaxone . Leukocytosis peaked at 43,600, improved to 26,600-21,500. Foley tube remains in place with mild hematuria, hemoglobin stable at 11.5. New onset HFrEF, etiology felt to be demand ischemia/sepsis related to positive troponin. Transthoracic echocardiogram showed LVEF of 35-40% with global hypokinesis. Received diuresis. Lactic acidosis resolved with sepsis management. AKI complicated by mild hydronephrosis from ureteral obstruction improved, latest creatinine 0.89.  Incidental finding of right upper extremity SVT diagnosed 12/3 with supportive care.  New onset atrial fibrillation 01/25/2024, cardiology follow-up, started on Toprol -XL 25 mg daily. No anticoagulation due to recent subdural bleed. History of PAD managed on DAPT for stent x 2 SFA/popliteal artery; antiplatelets held initially due to sepsis and thrombocytopenia. Platelet count 175,000, Plavix  and aspirin  resumed as of 01/27/2024.  Pressure injury of right/mid/left sacrum with wound care as directed. Tolerating a regular consistency diet.  Therapy evaluations completed due to decreased functional ability/septic shock, admitted for comprehensive rehab program. Received CIR 12/05/2023 - 12/19/2023 and discharged home with family, requiring supervision for bed mobility and ambulation of household distances with a rollator. Home health therapies arranged.  Lives with spouse in [place]. Two-level home, bed and bath on main level with 3 steps to entry. Wife assists with  some basic ADLs.  Medications: Aspirin  Plavix  Vancomycin  (empiric, transitioned) Zosyn  (empiric, transitioned) Ceftriaxone  Dobutamine  (weaned off) Vasopressin  (weaned off) Phenylephrine  (weaned off)  Norepinephrine  (weaned off) DuoNeb Solu-Medrol  Toprol -XL 25 mg dly  Pertinent Medical History: AAA Anemia of chronic disease Hypertension Hyperlipidemia Macular degeneration Popliteal aneurysm status post stenting by vascular surgery 2024 Traumatic subdural hematoma after a fall Nondisplaced right clavicle fracture Septic shock Acute pyelonephritis Obstructive uropathy E. coli bacteremia HFrEF AKI Right upper extremity SVT Atrial fibrillation PAD Bilateral ureteral stones Pressure injury of right/mid/left sacrum  Social History: Lives with spouse in [place]. Two-level home with bed and bath on main level. 3 steps to entry.  Current Psychosocial Factors: Wife assists with some basic ADLs.  Cognitive Assessment: Reports no memory of time while septic due to encephalopathy. Advised not to spend time trying to remember that period. Reports a concussion back in October prior to acute illness, also has no memory of this. Advised not to spend time trying to remember this period.  Behavioral Observations and Mental Status: Awake, alert, and engaged in conversation. Able to communicate effectively. Emotionally responsive, with tearfulness during a discussion about interpersonal relationships.  Patient had wife and daughter present and all had concerns about anger and frustration in home life and requested advise on improving interpersonal interactions in home.  Functional Assessment: Will be discharged home in 3 days. Discharge is based on readiness, not calendar date. Goals for discharge: home safety, not returning to hospital. Still requires rehabilitation for recovery on an outpatient basis.  Reports weakness in right foot, not previously experienced. Attributed to  prolonged bed rest and muscle atrophy from illness. Expected to improve with continued physical therapy. Able to do what is needed at home with reduced risk of falls and new injuries. This does not signify completion of recovery.  Goals Addressed in Current Session: Discussed concerns about kidney function and that clarification will come from MD.   Addressed memory loss during sepsis and after concussion. Explained acute confusion/memory disturbance during sepsis prevents memory formation and any recalled memories would be constructed. Advised against attempting to recall these periods.  Discussed the importance of continued rehabilitation post-discharge. Advised to engage actively in outpatient therapy and perform recommended exercises between sessions (e.g., chair exercises).  Discussed communication strategies within the family, especially regarding frustration. Emphasized smart selfish behavior (actions benefiting self and others) and open communication of feelings (e.g., I'm worried you'll fall instead of You're going too fast).  Advised against excessive cable news consumption to reduce frustration and promote positive mood. Suggested limiting news viewing to a brief, factual broadcast.  Discussed cherishing positive experiences like birdwatching.   Plan: 1. Continue rehab. 2. Engage in outpatient PT, perform between-session exercises. 3. Chair exercises: firm chair, facing couch, stand/sit. 4. Open communication with family: express feelings, not blame. 5. Limit cable news, watch brief factual broadcasts. 6. Move body daily (safely), eat well, get good sleep. 7. Cherish positive experiences (e.g., birdwatching). 8. Follow up with follow-up specialists as scheduled. 9. Review discharge instructions book with PA Dan, including meds and appointments.         Electronically Signed   _______________________ Norleen Asa, Psy.D. Clinical Neuropsychologist

## 2024-02-05 NOTE — Plan of Care (Signed)
°  Problem: Consults Goal: RH GENERAL PATIENT EDUCATION Description: See Patient Education module for education specifics. Outcome: Progressing   Problem: RH BOWEL ELIMINATION Goal: RH STG MANAGE BOWEL WITH ASSISTANCE Description: STG Manage Bowel with minimal  Assistance. Outcome: Progressing   Problem: RH BLADDER ELIMINATION Goal: RH STG MANAGE BLADDER WITH ASSISTANCE Description: STG Manage Bladder With min Assistance Outcome: Progressing   Problem: RH SKIN INTEGRITY Goal: RH STG SKIN FREE OF INFECTION/BREAKDOWN Description: Mange skin free of infection with min assistance Outcome: Progressing   Problem: RH SAFETY Goal: RH STG ADHERE TO SAFETY PRECAUTIONS W/ASSISTANCE/DEVICE Description: STG Adhere to Safety Precautions With min Assistance/Device. Outcome: Progressing   Problem: RH PAIN MANAGEMENT Goal: RH STG PAIN MANAGED AT OR BELOW PT'S PAIN GOAL Description: <4 w/ prns Outcome: Progressing   Problem: RH KNOWLEDGE DEFICIT GENERAL Goal: RH STG INCREASE KNOWLEDGE OF SELF CARE AFTER HOSPITALIZATION Description: Manage increase knowledge of self care after hospitalization with min assistance from spouse using educational materials provided Outcome: Progressing

## 2024-02-05 NOTE — Progress Notes (Signed)
 Occupational Therapy Session Note  Patient Details  Name: Jerry [name] MRN: [id number] Date of Birth: 08/13/1928  Today's Date: 02/05/2024 OT Individual Time: 1125-1200 OT Individual Time Calculation (min): 35 min  and Today's Date: 02/05/2024 OT Missed Time: 20 Minutes Missed Time Reason: Nursing care  Today's Date: 02/05/2024 OT Individual Time: 1420-1530 OT Individual Time Calculation (min): 70 min   Short Term Goals: Week 1:  OT Short Term Goal 1 (Week 1): STGs=LTGs due to patient's ELOS.  Skilled Therapeutic Interventions/Progress Updates:   Session 1: Pt greeted resting in bed, daughter reporting he is scheduled to get an IV placed soon. Pt transitions to EOB with increased time + supervision. As he was donning his shoes, RN enters room to place IV. OT excuses self for ~69mins, upon OT return patient ambulates from room<>day room with no UE support/AD, multimodal cuing to manage forward flexion, x1 episode of toe catching, Min A to correct. Pt ambulates x2 bouts of ~167ft with dual task challenge of balancing object with RUE and tossing ball onto ground. Min A provided for balance, mild dizziness when reaching towards floor. With use of 5# dowel bar, patient tosses ball back/forth for ~1 min for BUE strengthening. Pt remained sitting in [facility], spouse and daughter at side.   Session 2: Pt greeted resting in bed, no reports of pain. Pt performs bed mobility with increased time, supervision level. WC transport due to IV low on battery. Pt instructed in ~10 min of Nustep modality for BUE/BLE strengthening/endurance training for carryover into ADL participation and functional transfers. Pt completes [employer], resistance ranging from level 3-8, totaling 455 steps. Pt then instructed in series of activities targeting dynamic standing balance, righting reactions, and postural control for decreasing fall risk, details below:  2 x 1 min reps of lateral step ups onto airex pad 2 x  12 reps of chest press into overhead press while standing on airex foam (2# dowel bar utilized) 2 x 1 min cycles of ball toss onto floor while standing on airex foam   Pt requires CGA-Min A during activities, increased difficulty with RLE clearance, multiple instances of toe catching. Ankle strategies elicited when reaching outside BOS on airex, remained at CGA level. X1 anterior LOB while attempting to catch ball, Min A to recover, strong activation of VSR to prevent fall. Pt remains sitting in [facility] with all immediate needs met, family at bedside.    Therapy Documentation Precautions:  Precautions Precautions: Fall Recall of Precautions/Restrictions: Impaired Precaution/Restrictions Comments: HOH Restrictions Weight Bearing Restrictions Per Provider Order: Yes RUE Weight Bearing Per Provider Order: Weight bearing as tolerated   Therapy/Group: Individual Therapy  Nereida Habermann, OTR/L, MSOT  02/05/2024, 6:33 AM

## 2024-02-05 NOTE — Progress Notes (Addendum)
 PROGRESS NOTE   Subjective/Complaints:  No events overnight. Vitals stable, diastolic a bit low    02/05/2024    4:41 AM 02/04/2024    8:11 PM 02/04/2024    2:02 PM  Vitals with BMI  Systolic 128 129 883  Diastolic 50 57 50  Pulse 66 82 71    Recent Labs    02/04/24 2101  GLUCAP 133*    Labs with improved LFTs, Cr up to 1.38 from 1.1 P.o. intakes appropriate   Continent of bladder   Last BM 12/15   ROS: Denies fevers, chills, N/V, abdominal pain,  diarrhea, SOB, cough, chest pain, new weakness or paraesthesias.   + sore throat/cough--gradually improving + Nocturia--ongoing + Mild constipation  Objective:   No results found. Recent Labs    02/05/24 0512  WBC 7.3  HGB 11.4*  HCT 36.0*  PLT 342   Recent Labs    02/05/24 0512  NA 136  K 4.6  CL 106  CO2 24  GLUCOSE 96  BUN 31*  CREATININE 1.38*  CALCIUM  8.8*    Intake/Output Summary (Last 24 hours) at 02/05/2024 0839 Last data filed at 02/05/2024 9187 Gross per 24 hour  Intake 716 ml  Output 300 ml  Net 416 ml     Wound 01/29/24 1823 Pressure Injury Buttocks Left Stage 3 -  Full thickness tissue loss. Subcutaneous fat may be visible but bone, tendon or muscle are NOT exposed. (Active)     Wound 01/29/24 1824 Pressure Injury Sacrum Lower Stage 1 -  Intact skin with non-blanchable redness of a localized area usually over a bony prominence. (Active)     Wound 01/30/24 1039 Pressure Injury Heel Left Unstageable - Full thickness tissue loss in which the base of the injury is covered by slough (yellow, tan, gray, green or brown) and/or eschar (tan, brown or black) in the wound bed. (Active)     Wound 01/30/24 1040 Pressure Injury Foot Left;Lateral Unstageable - Full thickness tissue loss in which the base of the injury is covered by slough (yellow, tan, gray, green or brown) and/or eschar (tan, brown or black) in the wound bed. (Active)     Physical Exam: Vital Signs Blood pressure (!) 128/50, pulse 66, temperature 98 F (36.7 C), resp. rate 17, height 5' 5 (1.651 m), weight 72.6 kg, SpO2 100%. Constitutional: No apparent distress. Appropriate appearance for age.  Laying in bed, daughter at bedside HENT: No JVD. Neck Supple. Trachea midline. Atraumatic, normocephalic. Eyes: PERRLA. EOMI. Visual fields grossly intact.  Cardiovascular: RRR, no murmurs/rub/gallops. No Edema. Peripheral pulses 2+  Respiratory: CTAB. No rales, rhonchi, or wheezing. On RA.  Abdomen: + bowel sounds, normoactive. No distention or tenderness.   Skin: C/D/I. No apparent lesions. Sacral DTI, heel wounds as pictured below.  Images from 12/09          MSK:      No apparent deformity.    Neurologic exam:  Patient is alert, alert, and oriented x 3.  He is hard of hearing.  CN 2 through 12 grossly intact MMT: 4+/5 bilateral UE prox to distal. BLE 4-/5 HF, KE and 4/5 ADF/PF.  Sensory exam normal for light  touch and pain in all 4 limbs.  No limb ataxia or cerebellar signs.  No abnormal tone appreciated.      Physical exam unchanged from the above on reexamination 02/05/2024       Assessment/Plan: 1. Functional deficits which require 3+ hours per day of interdisciplinary therapy in a comprehensive inpatient rehab setting. Physiatrist is providing close team supervision and 24 hour management of active medical problems listed below. Physiatrist and rehab team continue to assess barriers to discharge/monitor patient progress toward functional and medical goals  Care Tool:  Bathing    Body parts bathed by patient: Right arm, Left arm, Chest, Abdomen, Front perineal area, Right upper leg, Left upper leg, Face, Buttocks, Right lower leg, Left lower leg   Body parts bathed by helper: Right lower leg, Left lower leg     Bathing assist Assist Level: Contact Guard/Touching assist     Upper Body Dressing/Undressing Upper body dressing    What is the patient wearing?: Pull over shirt    Upper body assist Assist Level: Set up assist    Lower Body Dressing/Undressing Lower body dressing      What is the patient wearing?: Pants, Underwear/pull up     Lower body assist Assist for lower body dressing: Minimal Assistance - Patient > 75%     Toileting Toileting    Toileting assist Assist for toileting: Contact Guard/Touching assist     Transfers Chair/bed transfer  Transfers assist     Chair/bed transfer assist level: Contact Guard/Touching assist     Locomotion Ambulation   Ambulation assist      Assist level: Contact Guard/Touching assist Assistive device: Walker-rolling Max distance: 180'   Walk 10 feet activity   Assist     Assist level: Contact Guard/Touching assist Assistive device: Walker-rolling   Walk 50 feet activity   Assist    Assist level: Contact Guard/Touching assist Assistive device: Walker-rolling    Walk 150 feet activity   Assist    Assist level: Contact Guard/Touching assist Assistive device: Walker-rolling    Walk 10 feet on uneven surface  activity   Assist     Assist level: Minimal Assistance - Patient > 75% Assistive device: Other (comment) (no device)   Wheelchair     Assist Is the patient using a wheelchair?: Yes Type of Wheelchair: Manual    Wheelchair assist level: Dependent - Patient 0%      Wheelchair 50 feet with 2 turns activity    Assist        Assist Level: Dependent - Patient 0%   Wheelchair 150 feet activity     Assist      Assist Level: Dependent - Patient 0%   Blood pressure (!) 128/50, pulse 66, temperature 98 F (36.7 C), resp. rate 17, height 5' 5 (1.651 m), weight 72.6 kg, SpO2 100%.   Medical Problem List and Plan: 1. Functional deficits secondary to septic shock secondary to acute pyelonephritis and obstructive uropathy with E. coli bacteremia.  Completing course of IV ceftriaxone               -patient may shower             -ELOS/Goals: 10-14 days, supervision goals with PT, and sup/min with OT - 12/18 DC date   - 12/9: CGA today  Stable to continue inpatient rehab  2.  Antithrombotics: -DVT/anticoagulation/right upper extremity SVT 12/3: Supportive care.  Mechanical: Antiembolism stockings, thigh (TED hose) Bilateral lower extremities             -  antiplatelet therapy: Aspirin  81 mg daily and Plavix  75 mg daily 3. Pain Management:  Tylenol  as needed             -denied pain today  - 12-9: Monitor Tylenol  use due to mild LFT elevation; seems rare, so unlikely contributor.  Likely hypoperfusion with sepsis.  Trend with next labs.  12-11: LFTs downtrending.  Monit0r-->improving 12/15     4. Mood/Behavior/Sleep: Melatonin 5 mg nightly as needed             -antipsychotic agents: N/A 5. Neuropsych/cognition: This patient is capable of making decisions on his own behalf. 6. Skin/Wound Care/WOC follow-up: Pressure injury to the sacrum  - Offloading for sacrum, heals with Mepilex 7. Fluids/Electrolytes/Nutrition: Routine IN and outs with follow-up chemistries             -decreased nutritional storage--encourage PO, appetite is not great but it's picking up per son  - 12-9: Eating okay, monitor  -12/14 eating most of his meals, continue to monitor  8.  Bilateral ureteral stones/left ureteral obstruction/urinary urgency.  Status post bilateral ureteral stents 11/29 per Dr.Wrenn.  Foley tube in place             -has persistent hematuria and sediment   - 12-10: DC Foley catheter trial today. Will get UA per family req with first void for lethargy.   12-11: Voiding well, UA with some bacteria and leukocyte esterase, WBCs so we will start Keflex  500 mg twice daily for 7 days and follow cultures.  Timed toileting given urinary frequency  - 12-12: Pending urine culture results.  Voiding well.  Still complaining of severe nocturia, was not on Flomax  prior to admission, DC Flomax  0.4 mg,  continue PVRs through the weekend.  -12/13 urine culture with Staph epidermidis, discussed with pharmacy antibiotic changed to doxycycline  100 mg twice daily  -12/14 Recheck CBC tomorrow, continues to have increased urination frequency at night, does not appear to have recent PVRs completed discussed with nursing who will check this  12/15: CBC improved, PVRs low--apparently started on Doxy over the weekend for Staph Epi 40K colonies - not clear infection but given reacent Hx reasonable to finish treatment  9.  Acute metabolic encephalopathy secondary to sepsis and acute infection.  Resolved with treatment. He is HOH 10.  New onset HFrEF.  Echocardiogram LVEF 35 to 40% with global hypokinesis.  Patient received IV diuresis and completed.  Elevated troponin felt to be related to demand ischemia trending down to 125   - no s/s volume overload Filed Weights   01/29/24 1829 02/04/24 0728  Weight: 70.8 kg 72.6 kg   - add daily weight 12/12  - Repeat weight pending-will asked nursing to complete, no signs of fluid overload noted 12/15: Weight slightly up per log, no external s/s volume overload, moniutor   11.  Lactic acidosis.  Resolved with management of sepsis 12.  AKI.  Baseline creatinine 0.9.  Creatinine 1.75 with peak of 2.16.  Complicated by mild hydronephrosis from ureteral obstruction.  Creatinine improved AKI resolved.  Follow-up chemistries   - 12/9: labs with stable BUN but mildly uptrending creatinine; encourage p.o. fluids.  - 12-11: BMP with increasing creatinine, downtrending BUN.  Patient drinking significantly less than he was on acute due to urinary frequency, adjusting Flomax  as above, encourage p.o. fluids and monitor Monday.  -12/13 continue encourage fluid intake  12.15: AKI Cr 1.3 - gentle IVF today 50 cc/hr for 1 L total, repeat in AM  13.  History of PAD.  Patient manage on DAPT for stent x 2 at SFA/popliteal artery.  Antiplatelets held on admission secondary to sepsis and  thrombocytopenia and resumed 01/27/2024 14.  New onset atrial fibrillation.  Follow-up cardiology services.  Continue Toprol -XL 50 mg daily   - Regular rate and rhythm on exam  15.  History of subdural hematoma and received CIR.  Cranial CT scan 11/28 showing no acute abnormality 16.  Hypertension.  Monitor with increased mobility  - Normotensive, vital stable.  Monitor.  -12/13-14 BP a little soft but overall controlled continue to monitor trend    02/05/2024    4:41 AM 02/04/2024    8:11 PM 02/04/2024    2:02 PM  Vitals with BMI  Systolic 128 129 883  Diastolic 50 57 50  Pulse 66 82 71     17.  Recent right clavicle fracture.  Advanced to weightbearing as tolerated. 18.  Hyperlipidemia.  Lipitor 19.  Constipation.  MiraLAX  daily, Senokot S1 tablet nightly             -had BM just before I came to see him today  LBM 12/15   20.  Intractable hiccups--resolved             -dc gabapentin              -trial of low-dose baclofen  5mg  bid. Observe for effect/tolerance 12-9: Hiccups continue, remain bothersome. Start protonix  40 mg daily, monitor on baclofen  today--monitor 12/10: Hiccups resolved; make baclofen  PRN    21. Sore throat. S/p extubation. Add cepacol lozenges PRN  - 12-11: Sore throat improving, with mild cough.  Encouraged incentive spirometer.  No concerning signs on exam.  Consider chest x-ray if no improvement.  12-12: Encouraged use of Cepacol lozenges.  Patient continues to have sore throat, but no other symptoms.  -12/14 reports Improving  LOS: 7 days A FACE TO FACE EVALUATION WAS PERFORMED  Jerry [name] 02/05/2024, 8:39 AM

## 2024-02-05 NOTE — Telephone Encounter (Signed)
 I will confirm with preop APP if pt needs to be seen with Heart Failure clinic or Gen card.

## 2024-02-05 NOTE — Telephone Encounter (Signed)
 Can be seen by general cardiology. Saw Dr.Schumann in the hospital.

## 2024-02-05 NOTE — Progress Notes (Signed)
 Physical Therapy Session Note  Patient Details  Name: Jerry [name] MRN: [id number] Date of Birth: 04/07/1928  Today's Date: 02/05/2024 PT Individual Time: 0805-0915 PT Individual Time Calculation (min): 70 min   Short Term Goals: Week 1:  PT Short Term Goal 1 (Week 1): STG = LTG due to ELOS  Skilled Therapeutic Interventions/Progress Updates:    Pt presents in room in bathroom handoff from RN, pt denies pain and agreeable to PT. Session focused on therapeutic activities to facilitate participation with self care tasks, upright tolerance as well as NMR to promote BLE muscle fiber recruitment, dynamic standing balance, BLE coordination, foot clearance, single limb strengthening. Pt completes transfers with supervision throughout session. Pt completes 3/3 toileting tasks with supervision, pt continent of bladder. Pt ambulates without device with light CGA to sink to complete hand hygiene then pt comes to sitting for rest break. Therapist retrieves rollator to simulate device pt uses at home. Pt ambulates with rollator with close supervision, comes to sitting on EOM for rest break. Pt completes NMR with agility ladder for dynamic functional balance and BLE coordinatoin including: - forward gait (one foot in each square) x4 - side stepping x4 bilaterally - backwards step to gait x2 - in and outs x2 Pt then completes NMR without agility ladder in standing to promote dynamic standing balance, single limb strengthening, foot clearance and upright posture including: - high knee marching x100' - kinetron 30sec work/60 sec rest 6 rounds Pt returns to room ambulating with rollator with supervision and remains seated in [facility] with all needs within reach, cal light in place and pt family at bedside at end of session.    Therapy Documentation Precautions:  Precautions Precautions: Fall Recall of Precautions/Restrictions: Impaired Precaution/Restrictions Comments: HOH Restrictions Weight Bearing  Restrictions Per Provider Order: Yes RUE Weight Bearing Per Provider Order: Weight bearing as tolerated    Therapy/Group: Individual Therapy  Reche Ohara PT, DPT 02/05/2024, 12:48 PM

## 2024-02-05 NOTE — Telephone Encounter (Signed)
 Left message for Shona Riedel at [facility]. Preop APP stated that it looks like procedure was done already. However, I see in the chart that there seems to be another procedure 03/12/24 with Dr. Watt. Need to confirm I that our preop team is addressing the correct procedure for preop clearance.   This is what I see in the chart now for 03/12/24:   Surgeon Role  Watt Rush, MD Primary   Procedure Laterality Anesthesia  CYSTOSCOPY/URETEROSCOPY/HOLMIUM LASER/STENT PLACEMENT Bilateral General

## 2024-02-05 NOTE — Telephone Encounter (Signed)
 I called to s/w the pt and his daughter answered the phone. We discussed about pt needing an appt in office for preop clearance. Pt's daughter states pt is currently residing IN [facility] and is being discharged Thursday 02/08/24. Pt's daughter scheduled appt Rosaline Bane, NP 02/29/24 @ [facility] location.

## 2024-02-05 NOTE — Telephone Encounter (Signed)
 Rhonda from [facility] called to f/u. Could you please re faxed information [phone]

## 2024-02-05 NOTE — Telephone Encounter (Signed)
° °  Name: Jerry [name]  DOB: 04/12/1928  MRN: [id number]  Primary Cardiologist: None  Chart reviewed as part of pre-operative protocol coverage. Because of [name]'s past medical history and time since last visit, he will require a follow-up in-office visit in order to better assess preoperative cardiovascular risk. Patient recently in the hospital with Afib and CHF.  Pre-op covering staff: - Please schedule appointment and call patient to inform them. If patient already had an upcoming appointment within acceptable timeframe, please add pre-op clearance to the appointment notes so provider is aware. - Please contact requesting surgeon's office via preferred method (i.e, phone, fax) to inform them of need for appointment prior to surgery.    Lamarr Satterfield, NP  02/05/2024, 9:29 AM

## 2024-02-06 LAB — BASIC METABOLIC PANEL WITH GFR
Anion gap: 10 (ref 5–15)
BUN: 29 mg/dL — ABNORMAL HIGH (ref 8–23)
CO2: 21 mmol/L — ABNORMAL LOW (ref 22–32)
Calcium: 8.9 mg/dL (ref 8.9–10.3)
Chloride: 107 mmol/L (ref 98–111)
Creatinine, Ser: 1.32 mg/dL — ABNORMAL HIGH (ref 0.61–1.24)
GFR, Estimated: 50 mL/min — ABNORMAL LOW (ref >60)
Glucose, Bld: 100 mg/dL — ABNORMAL HIGH (ref 70–99)
Potassium: 4.3 mmol/L (ref 3.5–5.1)
Sodium: 138 mmol/L (ref 135–145)

## 2024-02-06 LAB — GLUCOSE, CAPILLARY: Glucose-Capillary: 106 mg/dL — ABNORMAL HIGH (ref 70–99)

## 2024-02-06 MED ORDER — SODIUM CHLORIDE 0.9 % IV SOLN: INTRAVENOUS | Status: DC

## 2024-02-06 NOTE — Progress Notes (Signed)
 Physical Therapy Note  Patient Details  Name: Jerry [name] MRN: [id number] Date of Birth: Sep 11, 1928 Today's Date: 02/06/2024    Physical Therapist participated in the interdisciplinary team conference, providing clinical information regarding the patients current status, treatment goals, and weekly focus, including any barriers that need to be addressed. Please see the Inpatient Rehabilitation Team Conference and Plan of Care Update for further details.    Reche Ohara PT. DPT 02/06/2024, 10:59 AM

## 2024-02-06 NOTE — Progress Notes (Signed)
 Occupational Therapy Session Note  Patient Details  Name: Jerry [name] MRN: [id number] Date of Birth: August 08, 1928  Today's Date: 02/06/2024 OT Individual Time: 1130-1158 OT Individual Time Calculation (min): 28 min    Short Term Goals: Week 1:  OT Short Term Goal 1 (Week 1): STGs=LTGs due to patient's ELOS.  Skilled Therapeutic Interventions/Progress Updates:    Pt received supine with no c/o pain, agreeable to OT session. He came to EOB with (S). Ambulatory transfer to the w/c with CGA, no device. Pt was taken via w/c to the therapy gym for time management. Pt completed standing level dynamic throwing/catching task using a rebounder to challenge righting reaction, UE coordination, standing balance, and functional activity tolerance. 3 sets of 25 repetitions completed with OT providing CGA assist for balance, 1 set facing forward and the other two lateral to challenge trunk rotation. He then completed 100 ft of functional mobility holding a slideboard with cones balanced on top to challenge postural stability and dual task processing. He required CGA. Pt required use of rest breaks throughout session for recovery, as well as to support safety and prevent overexertion. During breaks, OT monitored recovery time to assess endurance and response to exertion. He ended with 100 ft of functional mobility with CGA to increase endurance for carryover to ADLs. He was left sitting up with all needs met.    Therapy Documentation Precautions:  Precautions Precautions: Fall Recall of Precautions/Restrictions: Impaired Precaution/Restrictions Comments: HOH Restrictions Weight Bearing Restrictions Per Provider Order: Yes RUE Weight Bearing Per Provider Order: Weight bearing as tolerated Therapy/Group: Individual Therapy  [name] 02/06/2024, 8:03 AM

## 2024-02-06 NOTE — Patient Care Conference (Signed)
 Inpatient RehabilitationTeam Conference and Plan of Care Update Date: 02/06/2024   Time: 1050 am    Patient Name: Jerry [name]      Medical Record Number: [id number]  Date of Birth: Oct 17, 1928 Sex: Male         Room/Bed: 4W25C/4W25C-01 Payor Info: Payor: MEDICARE / Plan: MEDICARE PART A AND B / Product Type: *No Product type* /    Admit Date/Time:  01/29/2024  5:39 PM  Primary Diagnosis:  Sepsis [facility])  Hospital Problems: Principal Problem:   Sepsis [facility]) Active Problems:   Cognitive changes    Expected Discharge Date: Expected Discharge Date: 02/08/24  Team Members Present: Physician leading conference: Dr. Joesph Likes Social Worker Present: Graeme Jude, LCSW Nurse Present: Eulalio Falls, RN PT Present: Catilin Osborn, PT OT Present: Nereida Habermann, OT SLP Present: Recardo Mole, SLP PPS Coordinator present : Eleanor Colon, SLP     Current Status/Progress Goal Weekly Team Focus  Bowel/Bladder   pt continent of b/b   Remain continent   Assist with BR needs qshift and prn    Swallow/Nutrition/ Hydration               ADL's   Setup for UB care; Intermediate Min A for LB care with fatigue (does require increased encouragement for independence). Barriers: generalized debility and decreased activity tolerance   Supervision   Caregiver education and discharge planning    Mobility   supervision overall with rollator walking >150'   supervision ambulatory  no true barriers - ready for DC    Communication                Safety/Cognition/ Behavioral Observations               Pain   C/o pain in the lower back, prn tyneol given   <3 pain score   Assess qshift and prn    Skin   Pressure injury on sacrum, see orders for care   Promote healing  Assess qshift and prn      Discharge Planning:  Pt will d/c to home with his wife who is primary caregiver. SW will confirm there are no barriers to discharge.    Team Discussion: Patient was  admitted post septic shock secondary to acute pyelonephritis and obstructive uropathy with E. coli bacteremia.  Patient with  Bilateral ureteral stones/left ureteral obstruction. Status post bilateral ureteral stents. Patient with AKI : medication adjusted by MD. Patient progress complicated by decreased activity tolerance and debility.  Patient on target to meet rehab goals: yes, patient needs set  up assistance with upper body care and needs min assistance with lower body care.Patient was able to ambulate up to 150' using a rollator with supervision assistance. Overall goals at discharge are set for supervision assistance.  See Care Plan and progress notes for long and short-term goals  Revisions to Treatment Plan:  Timed toileting  Daily weight  Teaching Needs: Safety, medications, transfers, toileting,encouragement for independence, wound care , etc.    Current Barriers to Discharge: Decreased caregiver support, Home enviroment access/layout, and Wound care  Possible Resolutions to Barriers: Family Education Home health follow up Patient with DME     Medical Summary Current Status: Medically complicated by back pain, sacral wounds, bilateral ureteral stents, UTI, urinary urgency, weakness  Barriers to Discharge: Cardiac Complications;Medical stability;Self-care education;Behavior/Mood;Pending surgery/plan   Possible Resolutions to [name]: Moderate pain tolerance with range of motion, treat sacral wounds,continue timed toiletting, treat UTI   Continued Need for  Acute Rehabilitation Level of Care: The patient requires daily medical management by a physician with specialized training in physical medicine and rehabilitation for the following reasons: Direction of a multidisciplinary physical rehabilitation program to maximize functional independence : Yes Medical management of patient stability for increased activity during participation in an intensive rehabilitation  regime.: Yes Analysis of laboratory values and/or radiology reports with any subsequent need for medication adjustment and/or medical intervention. : Yes   I attest that I was present, lead the team conference, and concur with the assessment and plan of the team.   Damarko Stitely Gayo 02/06/2024, 1050 am

## 2024-02-06 NOTE — Progress Notes (Signed)
 Physical Therapy Discharge Summary  Patient Details  Name: Jerry [name] MRN: [id number] Date of Birth: October 13, 1928  Date of Discharge from PT service:February 07, 2024  {CHL IP REHAB PT TIME CALCULATION:[id number]}   Patient has met 8 of 8 long term goals due to improved activity tolerance, improved balance, improved postural control, increased strength, and improved coordination.  Patient to discharge at an ambulatory level Supervision.   Patient's care partner is independent to provide the necessary physical assistance at discharge.  Reasons goals not met: N/A  Recommendation:  Patient will benefit from ongoing skilled PT services in home health setting to continue to advance safe functional mobility, address ongoing impairments in gait training without device, dynamic standing balance, BLE coordination, and minimize fall risk.  Equipment: No equipment provided  Reasons for discharge: treatment goals met and discharge from hospital  Patient/family agrees with progress made and goals achieved: Yes  PT Discharge Precautions/Restrictions Precautions Precautions: Fall Recall of Precautions/Restrictions: Impaired Precaution/Restrictions Comments: HOH Restrictions Weight Bearing Restrictions Per Provider Order: Yes RUE Weight Bearing Per Provider Order: Weight bearing as tolerated Pain Interference Pain Interference Pain Effect on Sleep: 1. Rarely or not at all Pain Interference with Therapy Activities: 1. Rarely or not at all Pain Interference with Day-to-Day Activities: 1. Rarely or not at all Cognition Overall Cognitive Status: Within Functional Limits for tasks assessed Arousal/Alertness: Awake/alert Orientation Level: Oriented X4 Sensation Sensation Light Touch: Impaired Detail Peripheral sensation comments: Reports mild numbness in R-foot Light Touch Impaired Details: Impaired RLE Hot/Cold: Appears Intact Proprioception: Not tested Stereognosis: Not  tested Coordination Gross Motor Movements are Fluid and Coordinated: No Fine Motor Movements are Fluid and Coordinated: Yes Coordination and Movement Description: Deficits due to generalized weakness/debility and decreased balance strategies, improved from eval Motor  Motor Motor: Other (comment) Motor - Discharge Observations: Generalized weakness/debility, improved from eval.  Mobility Bed Mobility Bed Mobility: Supine to Sit;Sit to Supine Supine to Sit: Independent Sit to Supine: Independent Transfers Transfers: Sit to Stand;Stand to Sit;Stand Pivot Transfers Sit to Stand: Supervision/Verbal cueing Stand to Sit: Supervision/Verbal cueing Stand Pivot Transfers: Supervision/Verbal cueing Stand Pivot Transfer Details: Verbal cues for safe use of DME/AE Transfer (Assistive device): None Locomotion  Gait Ambulation: Yes Gait Assistance: Supervision/Verbal cueing Gait Distance (Feet): 200 Feet Assistive device: Rollator Gait Assistance Details: Verbal cues for safe use of DME/AE Gait Gait: Yes Gait Pattern: Impaired Gait Pattern: Trunk flexed;Decreased stride length;Poor foot clearance - right Gait velocity: reduced Stairs / Additional Locomotion Stairs: Yes Stairs Assistance: Supervision/Verbal cueing Stair Management Technique: Two rails Number of Stairs: 12 Height of Stairs: 6 Ramp: Supervision/Verbal cueing Wheelchair Mobility Wheelchair Mobility: No  Trunk/Postural Assessment  Cervical Assessment Cervical Assessment: Exceptions to [facility] (forward head) Thoracic Assessment Thoracic Assessment: Exceptions to [facility] (rounded shoulders) Lumbar Assessment Lumbar Assessment: Exceptions to [facility] (posterior pelvic tilt) Postural Control Postural Control: Deficits on evaluation Righting Reactions: Decreased/delayed Protective Responses: Decreased/delayed  Balance   Extremity Assessment  RLE Assessment RLE Assessment: Within Functional Limits LLE Assessment LLE Assessment:  Within Functional Limits   Reche Ohara PT, DPT 02/06/2024, 2:53 PM

## 2024-02-06 NOTE — Progress Notes (Signed)
 Occupational Therapy Note  Patient Details  Name: TREMELL REIMERS MRN: [id number] Date of Birth: [birth year]   Occupational Therapist participated in the interdisciplinary team conference, providing clinical information regarding the patients current status, treatment goals, and weekly focus, including any barriers that need to be addressed. Please see the Inpatient Rehabilitation Team Conference and Plan of Care Update for further details.   Nereida Habermann, OTR/L, MSOT  02/06/2024, 6:28 AM

## 2024-02-06 NOTE — Progress Notes (Signed)
 Occupational Therapy Session Note  Patient Details  Name: Jerry [name] MRN: [id number] Date of Birth: 17-Jun-1928  Today's Date: 02/06/2024 OT Individual Time: 0805-0900 OT Individual Time Calculation (min): 55 min   Today's Date: 02/06/2024 OT Individual Time: 1341-1445 OT Individual Time Calculation (min): 64 min   Short Term Goals: Week 1:  OT Short Term Goal 1 (Week 1): STGs=LTGs due to patient's ELOS.  Skilled Therapeutic Interventions/Progress Updates:   Session 1: Pt greeted completing toileting with NT present for skilled OT session with focus on BADL retraining at shower-level.   Pain: Pt with no reports of pain.OT offering intermediate rest breaks and positioning suggestions throughout session to address pain/fatigue and maximize participation/safety in session.   Functional Transfers: Pt uses grab bars within bathroom environment for balance during care. Rollator utilized for household level ambulation around unit.   Self Care Tasks: Pt completes the following self care tasks with levels of assistance noted below, UB: Setup/supervision for UB care.  LB: Close supervision for standing pericare with use of grab bar. Assistance provided for thorough care of B-feet. Dressing at supervision level for LB garments, dependent for TEDs.   Therapeutic Activities: Pt ambulates ~50 ft with rollator weighted at 35# for further endurance training. Supervision level with improved AD proximity.   Pt remained in care of family with 4Ps assessed and immediate needs met. Pt continues to be appropriate for skilled OT intervention to promote further functional independence in ADLs/IADLs.   Session 2: Pt greeted resting in bed for skilled OT session with focus on caregiver education and discharge planning.   Pain: Pt with no reports of pain. OT offering intermediate rest breaks and positioning suggestions throughout session to address pain/fatigue and maximize participation/safety in  session.   Functional Transfers: All transfers with supervision + use of rollator. Continued cuing required for safe AD brake management.   Self Care Tasks/Education: 3/3 toilet tasks with supervision + use of rollator. Spouse present and educated on walk-in shower, bed, and toilet transfers, prioritizing methods to reduce fall risk. Pt provides direct supervision of the above transfers, including household level ambulation.   Therapeutic Activities: Pt instructed in 2 x 1 min reps of dynamic standing balance onto of Rolyan Beep Board, Min A required to step onto/off of board, up to Min A required for standing balance during task. Activity upgraded to increased functional reach above head-level. X1 Posterior LOB onto EOM.   Pt remained resting in bed with 4Ps assessed and immediate needs met. Pt continues to be appropriate for skilled OT intervention to promote further functional independence in ADLs/IADLs.   Therapy Documentation Precautions:  Precautions Precautions: Fall Recall of Precautions/Restrictions: Impaired Precaution/Restrictions Comments: HOH Restrictions Weight Bearing Restrictions Per Provider Order: Yes RUE Weight Bearing Per Provider Order: Weight bearing as tolerated   Therapy/Group: Individual Therapy  Nereida Habermann, OTR/L, MSOT  02/06/2024, 6:27 AM

## 2024-02-06 NOTE — Progress Notes (Signed)
 PROGRESS NOTE   Subjective/Complaints:  No events overnight.  Creatinine slightly improved with IV fluids yesterday, but still higher than normal.  Patient endorses he is drinking about a half a cup of water  a day on his own.  Agreeable to doing better today, no other concerns or complaints.    ROS: Denies fevers, chills, N/V, abdominal pain,  diarrhea, SOB, cough, chest pain, new weakness or paraesthesias.    + Nocturia--ongoing + Mild constipation  Objective:   No results found. Recent Labs    02/05/24 0512  WBC 7.3  HGB 11.4*  HCT 36.0*  PLT 342   Recent Labs    02/05/24 0512 02/06/24 0516  NA 136 138  K 4.6 4.3  CL 106 107  CO2 24 21*  GLUCOSE 96 100*  BUN 31* 29*  CREATININE 1.38* 1.32*  CALCIUM  8.8* 8.9    Intake/Output Summary (Last 24 hours) at 02/06/2024 1050 Last data filed at 02/06/2024 0805 Gross per 24 hour  Intake 220 ml  Output 900 ml  Net -680 ml     Wound 01/29/24 1823 Pressure Injury Buttocks Left Stage 3 -  Full thickness tissue loss. Subcutaneous fat may be visible but bone, tendon or muscle are NOT exposed. (Active)     Wound 01/29/24 1824 Pressure Injury Sacrum Lower Stage 1 -  Intact skin with non-blanchable redness of a localized area usually over a bony prominence. (Active)     Wound 01/30/24 1039 Pressure Injury Heel Left Unstageable - Full thickness tissue loss in which the base of the injury is covered by slough (yellow, tan, gray, green or brown) and/or eschar (tan, brown or black) in the wound bed. (Active)     Wound 01/30/24 1040 Pressure Injury Foot Left;Lateral Unstageable - Full thickness tissue loss in which the base of the injury is covered by slough (yellow, tan, gray, green or brown) and/or eschar (tan, brown or black) in the wound bed. (Active)    Physical Exam: Vital Signs Blood pressure 136/69, pulse 72, temperature 98.1 F (36.7 C), temperature source Oral,  resp. rate 18, height 5' 5 (1.651 m), weight 72.6 kg, SpO2 100%. Constitutional: No apparent distress. Appropriate appearance for age.  Laying in bed. HENT: No JVD. Neck Supple. Trachea midline. Atraumatic, normocephalic. Eyes: PERRLA. EOMI. Visual fields grossly intact.  Cardiovascular: RRR, no murmurs/rub/gallops. No Edema. Peripheral pulses 2+  Respiratory: CTAB. No rales, rhonchi, or wheezing. On RA.  Abdomen: + bowel sounds, normoactive. No distention or tenderness.   Skin: C/D/I. No apparent lesions. Sacral DTI, heel wounds as pictured below.  Images from 12/09          MSK:      No apparent deformity.    Neurologic exam:  Patient is alert, alert, and oriented x 3.  He is hard of hearing.  CN 2 through 12 grossly intact MMT: 4+/5 bilateral UE prox to distal. BLE 4-/5 HF, KE and 4/5 ADF/PF.  Sensory exam normal for light touch and pain in all 4 limbs.  No limb ataxia or cerebellar signs.  No abnormal tone appreciated.      Physical exam unchanged from the above on reexamination 02/06/2024  Assessment/Plan: 1. Functional deficits which require 3+ hours per day of interdisciplinary therapy in a comprehensive inpatient rehab setting. Physiatrist is providing close team supervision and 24 hour management of active medical problems listed below. Physiatrist and rehab team continue to assess barriers to discharge/monitor patient progress toward functional and medical goals  Care Tool:  Bathing    Body parts bathed by patient: Right arm, Left arm, Chest, Abdomen, Front perineal area, Right upper leg, Left upper leg, Face, Buttocks, Right lower leg, Left lower leg   Body parts bathed by helper: Right lower leg, Left lower leg     Bathing assist Assist Level: Contact Guard/Touching assist     Upper Body Dressing/Undressing Upper body dressing   What is the patient wearing?: Pull over shirt    Upper body assist Assist Level: Set up assist    Lower Body  Dressing/Undressing Lower body dressing      What is the patient wearing?: Pants, Underwear/pull up     Lower body assist Assist for lower body dressing: Minimal Assistance - Patient > 75%     Toileting Toileting    Toileting assist Assist for toileting: Contact Guard/Touching assist     Transfers Chair/bed transfer  Transfers assist     Chair/bed transfer assist level: Contact Guard/Touching assist     Locomotion Ambulation   Ambulation assist      Assist level: Contact Guard/Touching assist Assistive device: Walker-rolling Max distance: 180'   Walk 10 feet activity   Assist     Assist level: Contact Guard/Touching assist Assistive device: Walker-rolling   Walk 50 feet activity   Assist    Assist level: Contact Guard/Touching assist Assistive device: Walker-rolling    Walk 150 feet activity   Assist    Assist level: Contact Guard/Touching assist Assistive device: Walker-rolling    Walk 10 feet on uneven surface  activity   Assist     Assist level: Minimal Assistance - Patient > 75% Assistive device: Other (comment) (no device)   Wheelchair     Assist Is the patient using a wheelchair?: Yes Type of Wheelchair: Manual    Wheelchair assist level: Dependent - Patient 0%      Wheelchair 50 feet with 2 turns activity    Assist        Assist Level: Dependent - Patient 0%   Wheelchair 150 feet activity     Assist      Assist Level: Dependent - Patient 0%   Blood pressure 136/69, pulse 72, temperature 98.1 F (36.7 C), temperature source Oral, resp. rate 18, height 5' 5 (1.651 m), weight 72.6 kg, SpO2 100%.   Medical Problem List and Plan: 1. Functional deficits secondary to septic shock secondary to acute pyelonephritis and obstructive uropathy with E. coli bacteremia.  Completing course of IV ceftriaxone              -patient may shower             -ELOS/Goals: 10-14 days, supervision goals with PT, and  sup/min with OT - 12/18 DC date   - 12/9: CGA today  Stable to continue inpatient rehab  2.  Antithrombotics: -DVT/anticoagulation/right upper extremity SVT 12/3: Supportive care.  Mechanical: Antiembolism stockings, thigh (TED hose) Bilateral lower extremities             -antiplatelet therapy: Aspirin  81 mg daily and Plavix  75 mg daily 3. Pain Management:  Tylenol  as needed             -denied  pain today  - 12-9: Monitor Tylenol  use due to mild LFT elevation; seems rare, so unlikely contributor.  Likely hypoperfusion with sepsis.  Trend with next labs.  12-11: LFTs downtrending.  Monit0r-->improving 12/15     4. Mood/Behavior/Sleep: Melatonin 5 mg nightly as needed             -antipsychotic agents: N/A 5. Neuropsych/cognition: This patient is capable of making decisions on his own behalf. 6. Skin/Wound Care/WOC follow-up: Pressure injury to the sacrum  - Offloading for sacrum, heals with Mepilex 7. Fluids/Electrolytes/Nutrition: Routine IN and outs with follow-up chemistries             -decreased nutritional storage--encourage PO, appetite is not great but it's picking up per son  - 12-9: Eating okay, monitor  -12/14 eating most of his meals, continue to monitor  12-16: Encouraging p.o. fluids, to drink 6 cups of water  today, hopeful to DC IV fluids tomorrow.  8.  Bilateral ureteral stones/left ureteral obstruction/urinary urgency.  Status post bilateral ureteral stents 11/29 per Dr.Wrenn.  Foley tube in place             -has persistent hematuria and sediment   - 12-10: DC Foley catheter trial today. Will get UA per family req with first void for lethargy.   12-11: Voiding well, UA with some bacteria and leukocyte esterase, WBCs so we will start Keflex  500 mg twice daily for 7 days and follow cultures.  Timed toileting given urinary frequency  - 12-12: Pending urine culture results.  Voiding well.  Still complaining of severe nocturia, was not on Flomax  prior to admission, DC  Flomax  0.4 mg, continue PVRs through the weekend.  -12/13 urine culture with Staph epidermidis, discussed with pharmacy antibiotic changed to doxycycline  100 mg twice daily  -12/14 Recheck CBC tomorrow, continues to have increased urination frequency at night, does not appear to have recent PVRs completed discussed with nursing who will check this  12/15: CBC improved, PVRs low--apparently started on Doxy over the weekend for Staph Epi 40K colonies - not clear infection but given reacent Hx reasonable to finish treatment  9.  Acute metabolic encephalopathy secondary to sepsis and acute infection.  Resolved with treatment. He is HOH 10.  New onset HFrEF.  Echocardiogram LVEF 35 to 40% with global hypokinesis.  Patient received IV diuresis and completed.  Elevated troponin felt to be related to demand ischemia trending down to 125   - no s/s volume overload Filed Weights   01/29/24 1829 02/04/24 0728  Weight: 70.8 kg 72.6 kg   - add daily weight 12/12  - Repeat weight pending-will asked nursing to complete, no signs of fluid overload noted 12/15: Weight slightly up per log, no external s/s volume overload, monitor--stable appearance 12-16   11.  Lactic acidosis.  Resolved with management of sepsis 12.  AKI.  Baseline creatinine 0.9.  Creatinine 1.75 with peak of 2.16.  Complicated by mild hydronephrosis from ureteral obstruction.  Creatinine improved AKI resolved.  Follow-up chemistries   - 12/9: labs with stable BUN but mildly uptrending creatinine; encourage p.o. fluids.  - 12-11: BMP with increasing creatinine, downtrending BUN.  Patient drinking significantly less than he was on acute due to urinary frequency, adjusting Flomax  as above, encourage p.o. fluids and monitor Monday.  -12/13 continue encourage fluid intake  12.15: AKI Cr 1.3 - gentle IVF today 50 cc/hr for 1 L total, repeat in AM  12-16: Small improvement, creatinine 1.32, BUN 29.  Increase IV fluids  to 75 cc/h, repeat BMP in AM.   Patient also to drink more fluids today.  13.  History of PAD.  Patient manage on DAPT for stent x 2 at SFA/popliteal artery.  Antiplatelets held on admission secondary to sepsis and thrombocytopenia and resumed 01/27/2024 14.  New onset atrial fibrillation.  Follow-up cardiology services.  Continue Toprol -XL 50 mg daily   - Regular rate and rhythm on exam  15.  History of subdural hematoma and received CIR.  Cranial CT scan 11/28 showing no acute abnormality 16.  Hypertension.  Monitor with increased mobility  - Normotensive, vital stable.  Monitor.  -12/13-14 BP a little soft but overall controlled continue to monitor trend    02/06/2024    5:12 AM 02/05/2024    7:37 PM 02/05/2024    1:05 PM  Vitals with BMI  Systolic 136 134 899  Diastolic 69 56 49  Pulse 72 73 61     17.  Recent right clavicle fracture.  Advanced to weightbearing as tolerated. 18.  Hyperlipidemia.  Lipitor 19.  Constipation.  MiraLAX  daily, Senokot S1 tablet nightly             -had BM just before I came to see him today  LBM 12/15   20.  Intractable hiccups--resolved             -dc gabapentin              -trial of low-dose baclofen  5mg  bid. Observe for effect/tolerance 12-9: Hiccups continue, remain bothersome. Start protonix  40 mg daily, monitor on baclofen  today--monitor 12/10: Hiccups resolved; make baclofen  PRN--dc 12/16    21. Sore throat. S/p extubation. Add cepacol lozenges PRN  - 12-11: Sore throat improving, with mild cough.  Encouraged incentive spirometer.  No concerning signs on exam.  Consider chest x-ray if no improvement.  12-12: Encouraged use of Cepacol lozenges.  Patient continues to have sore throat, but no other symptoms.  -12/14 reports Improving  LOS: 8 days A FACE TO FACE EVALUATION WAS PERFORMED  Joesph JAYSON Likes 02/06/2024, 10:50 AM

## 2024-02-06 NOTE — Progress Notes (Signed)
 Physical Therapy Session Note  Patient Details  Name: Jerry [name] MRN: [id number] Date of Birth: 23-May-1928  Today's Date: 02/06/2024 PT Individual Time: 0935-1030 PT Individual Time Calculation (min): 55 min   Short Term Goals: Week 1:  PT Short Term Goal 1 (Week 1): STG = LTG due to ELOS  Skilled Therapeutic Interventions/Progress Updates:    Pt presents in room in [facility], agreeable to PT. Pt denies pain. Session focused on therapeutic activities to promote safety and independence with self care tasks as well as NMR for dynamic standing balance, BLE muscle fiber recruitment, and postural stability with perturbations. Pt ambulates without device to bathroom with light CGA, completes toilet transfer and 3/3 toileting tasks with supervision. Pt completes gait to sink to complete hand hygiene with CGA. Pt then ambulates with rollator to day room with supervision. Pt comes to sitting on EOM, then completes NMR in standing including: - sit to stands holding 4# med ball x10 - sit to stand with overhead press 4# med ball 2x5 - RDLs tidal tank to bench x10, to 12 height x10, to 8 height x10 - ambulation 180' holding tidal tank - gait 180' with therapist providing external perturbations - gait navigating x3 cones no device supervision x4 trials - gait navigating x3 cones while tossing ball to self x4 trials - step ups 4 step 2x10 BLE Pt provided with seated rest breaks between all gait trials and exercises to promote energy conservation and quality with tasks. Pt returns to room ambulating with rollator with supervision, returns to supine with supervision, remains semi reclined with all needs within reach, cal light in place and family at bedside at end of session.   Therapy Documentation Precautions:  Precautions Precautions: Fall Recall of Precautions/Restrictions: Impaired Precaution/Restrictions Comments: HOH Restrictions Weight Bearing Restrictions Per Provider Order: Yes RUE Weight  Bearing Per Provider Order: Weight bearing as tolerated    Therapy/Group: Individual Therapy  Reche Ohara PT, DPT 02/06/2024, 11:00 AM

## 2024-02-06 NOTE — Progress Notes (Addendum)
 Patient ID: BRYANT SAYE, male   DOB: Aug 09, 1928, 89 y.o.   MRN: [id number]  SW met with pt and pt wife in room to provide updates from team conference, d/c date 12/18, and outpatient PT/OT recommended. Wife prefers HH as they do not drive and was upset about recommendation. Prefers HH instead. Previous agency was [facility]. SW informed will rech out to agency.   SW sent HHPT/OT/aide referral to [facility] and waiting on follow-up.   1437- SW spoke with pt dtr Cara to discuss above. She confirms that the children will not be at the home with them, and it will only be the two of them together. SW will confirm HHA.   HHPT/OT/aide/SN referral accepted.   SW updated pt dtr Cara on above.   Graeme Jude, MSW, LCSW Office: [phone] Cell: [phone] Fax: [phone]

## 2024-02-07 ENCOUNTER — Other Ambulatory Visit (HOSPITAL_COMMUNITY): Payer: Self-pay

## 2024-02-07 DIAGNOSIS — N390 Urinary tract infection, site not specified: Secondary | ICD-10-CM

## 2024-02-07 DIAGNOSIS — A4150 Gram-negative sepsis, unspecified: Secondary | ICD-10-CM

## 2024-02-07 LAB — BASIC METABOLIC PANEL WITH GFR
Anion gap: 7 (ref 5–15)
BUN: 30 mg/dL — ABNORMAL HIGH (ref 8–23)
CO2: 23 mmol/L (ref 22–32)
Calcium: 8.9 mg/dL (ref 8.9–10.3)
Chloride: 106 mmol/L (ref 98–111)
Creatinine, Ser: 1.21 mg/dL (ref 0.61–1.24)
GFR, Estimated: 56 mL/min — ABNORMAL LOW (ref >60)
Glucose, Bld: 98 mg/dL (ref 70–99)
Potassium: 4.3 mmol/L (ref 3.5–5.1)
Sodium: 136 mmol/L (ref 135–145)

## 2024-02-07 MED ORDER — METOPROLOL SUCCINATE ER 50 MG PO TB24
50.0000 mg | ORAL_TABLET | Freq: Every day | ORAL | 0 refills | Status: DC
  Filled 2024-02-07: qty 30, 30d supply, fill #0

## 2024-02-07 MED ORDER — MELATONIN 5 MG PO TABS
5.0000 mg | ORAL_TABLET | Freq: Every evening | ORAL | 0 refills | Status: AC | PRN
  Filled 2024-02-07: qty 30, 30d supply, fill #0

## 2024-02-07 MED ORDER — ATORVASTATIN CALCIUM 40 MG PO TABS
40.0000 mg | ORAL_TABLET | Freq: Every day | ORAL | 0 refills | Status: AC
  Filled 2024-02-07: qty 30, 30d supply, fill #0

## 2024-02-07 MED ORDER — AMIODARONE HCL 200 MG PO TABS
ORAL_TABLET | ORAL | 0 refills | Status: DC
  Filled 2024-02-07: qty 38, 34d supply, fill #0

## 2024-02-07 MED ORDER — FAMOTIDINE 20 MG PO TABS
20.0000 mg | ORAL_TABLET | Freq: Every day | ORAL | 0 refills | Status: DC
  Filled 2024-02-07: qty 30, 30d supply, fill #0

## 2024-02-07 MED ORDER — PANTOPRAZOLE SODIUM 40 MG PO TBEC
40.0000 mg | DELAYED_RELEASE_TABLET | Freq: Every day | ORAL | 0 refills | Status: DC
  Filled 2024-02-07: qty 30, 30d supply, fill #0

## 2024-02-07 MED ORDER — CLOPIDOGREL BISULFATE 75 MG PO TABS
75.0000 mg | ORAL_TABLET | Freq: Every day | ORAL | 0 refills | Status: AC
  Filled 2024-02-07: qty 30, 30d supply, fill #0

## 2024-02-07 NOTE — Progress Notes (Signed)
 Occupational Therapy Discharge Summary  Patient Details  Name: Jerry [name] MRN: [id number] Date of Birth: 06/04/1928  Date of Discharge from OT service:February 07, 2024   Patient has met 7 of 7 long term goals due to improved activity tolerance, improved balance, postural control, ability to compensate for deficits, improved attention, and improved awareness.  Patient to discharge at overall Supervision level.  Patient's care partner is independent to provide the necessary cognitive assistance at discharge.    Reasons goals not met: N/A  Recommendation:  Patient will benefit from ongoing skilled OT services in home health setting to continue to advance functional skills in the area of BADL, iADL, and Reduce care partner burden.  Equipment: N/A  Reasons for discharge: treatment goals met and discharge from hospital  Patient/family agrees with progress made and goals achieved: Yes  OT Discharge Precautions/Restrictions  Precautions Precautions: Fall Recall of Precautions/Restrictions: Impaired Precaution/Restrictions Comments: HOH Restrictions Weight Bearing Restrictions Per Provider Order: Yes RUE Weight Bearing Per Provider Order: Weight bearing as tolerated Pain Pain Assessment Pain Scale: 0-10 Pain Score: 0-No pain ADL ADL Eating: Modified independent Where Assessed-Eating: Bed level, Wheelchair Grooming: Setup Where Assessed-Grooming: Standing at sink, Sitting at sink Upper Body Bathing: Modified independent Where Assessed-Upper Body Bathing: Shower Lower Body Bathing: Supervision/safety Where Assessed-Lower Body Bathing: Shower Upper Body Dressing: Modified independent (Device) Where Assessed-Upper Body Dressing: Chair, Wheelchair Lower Body Dressing: Supervision/safety Where Assessed-Lower Body Dressing: Chair, Wheelchair Toileting: Supervision/safety Where Assessed-Toileting: Toilet, Bedside Commode Toilet Transfer: Distant supervision Toilet Transfer  Method: Proofreader: Gaffer: Not assessed Film/video Editor: Close supervision Film/video Editor Method: Designer, Industrial/product: Information systems manager with back, Grab bars Vision Baseline Vision/History: 1 Wears glasses Patient Visual Report: No change from baseline Vision Assessment?: Wears glasses for reading;Wears glasses for driving Perception  Perception: Within Functional Limits Praxis Praxis: WFL Cognition Cognition Overall Cognitive Status: Within Functional Limits for tasks assessed Arousal/Alertness: Awake/alert Safety/Judgment: Appears intact Sensation Sensation Light Touch: Impaired Detail Peripheral sensation comments: Reports mild numbness in R-foot Light Touch Impaired Details: Impaired RLE Hot/Cold: Appears Intact Proprioception: Not tested Stereognosis: Not tested Coordination Gross Motor Movements are Fluid and Coordinated: No Fine Motor Movements are Fluid and Coordinated: Yes Coordination and Movement Description: Deficits due to generalized weakness/debility and decreased balance strategies, improved from evaluation. Motor  Motor Motor: Other (comment) Motor - Discharge Observations: Deficits due to generalized weakness/debility and decreased balance strategies, improved from evaluation. Mobility  Transfers Sit to Stand: Supervision/Verbal cueing Stand to Sit: Supervision/Verbal cueing  Trunk/Postural Assessment  Cervical Assessment Cervical Assessment: Exceptions to [facility] (forward head) Thoracic Assessment Thoracic Assessment: Exceptions to [facility] (rounded shoulders) Lumbar Assessment Lumbar Assessment: Exceptions to [facility] (posterior pelvic tilt) Postural Control Postural Control: Deficits on evaluation Righting Reactions: Decreased/delayed Protective Responses: Decreased/delayed  Balance Balance Balance Assessed: Yes Static Sitting Balance Static Sitting - Balance Support: Feet  supported Static Sitting - Level of Assistance: 6: Modified independent (Device/Increase time) Dynamic Sitting Balance Dynamic Sitting - Balance Support: During functional activity Dynamic Sitting - Level of Assistance: 6: Modified independent (Device/Increase time) Static Standing Balance Static Standing - Balance Support: During functional activity;Bilateral upper extremity supported Static Standing - Level of Assistance: 6: Modified independent (Device/Increase time) Dynamic Standing Balance Dynamic Standing - Balance Support: During functional activity Dynamic Standing - Level of Assistance: 5: Stand by assistance Extremity/Trunk Assessment RUE Assessment RUE Assessment: Exceptions to [facility] Active Range of Motion (AROM) Comments: [facility] General Strength Comments: 3-/5 LUE Assessment LUE Assessment:  Within Functional Limits   Nereida Habermann, OTR/L, MSOT  02/07/2024, 6:26 AM

## 2024-02-07 NOTE — Plan of Care (Signed)
  Problem: RH Balance Goal: LTG Patient will maintain dynamic standing with ADLs (OT) Description: LTG:  Patient will maintain dynamic standing balance with assist during activities of daily living (OT)  Outcome: Completed/Met   Problem: Sit to Stand Goal: LTG:  Patient will perform sit to stand in prep for activites of daily living with assistance level (OT) Description: LTG:  Patient will perform sit to stand in prep for activites of daily living with assistance level (OT) Outcome: Completed/Met   Problem: RH Bathing Goal: LTG Patient will bathe all body parts with assist levels (OT) Description: LTG: Patient will bathe all body parts with assist levels (OT) Outcome: Completed/Met   Problem: RH Dressing Goal: LTG Patient will perform lower body dressing w/assist (OT) Description: LTG: Patient will perform lower body dressing with assist, with/without cues in positioning using equipment (OT) Outcome: Completed/Met   Problem: RH Toileting Goal: LTG Patient will perform toileting task (3/3 steps) with assistance level (OT) Description: LTG: Patient will perform toileting task (3/3 steps) with assistance level (OT)  Outcome: Completed/Met   Problem: RH Toilet Transfers Goal: LTG Patient will perform toilet transfers w/assist (OT) Description: LTG: Patient will perform toilet transfers with assist, with/without cues using equipment (OT) Outcome: Completed/Met   Problem: RH Tub/Shower Transfers Goal: LTG Patient will perform tub/shower transfers w/assist (OT) Description: LTG: Patient will perform tub/shower transfers with assist, with/without cues using equipment (OT) Outcome: Completed/Met   

## 2024-02-07 NOTE — Progress Notes (Signed)
 Inpatient Rehabilitation Care Coordinator Discharge Note   Patient Details  Name: Jerry [name] MRN: [id number] Date of Birth: May 11, 1928   Discharge location: D/c to home  Length of Stay: 9 days  Discharge activity level: Supervision  Home/community participation: Limited  Patient response un:Yzjouy Literacy - How often do you need to have someone help you when you read instructions, pamphlets, or other written material from your doctor or pharmacy?: Never  Patient response un:Dnrpjo Isolation - How often do you feel lonely or isolated from those around you?: Rarely  Services provided included: RD, MD, PT, OT, SLP, RN, TR, Pharmacy, Neuropsych, SW, CM  Financial Services:  Field Seismologist Utilized: Medicare    Choices offered to/list presented to: patient wife  Follow-up services arranged:  Home Health Home Health Agency: HHPT/OT/SN (medication mangaement and CHF hx)    DME : has all DME already    Patient response to transportation need: Is the patient able to respond to transportation needs?: Yes In the past 12 months, has lack of transportation kept you from medical appointments or from getting medications?: No In the past 12 months, has lack of transportation kept you from meetings, work, or from getting things needed for daily living?: No   Patient/Family verbalized understanding of follow-up arrangements:  Yes  Individual responsible for coordination of the follow-up plan:    Confirmed correct DME delivered: Graeme DELENA Jude 02/07/2024    Comments (or additional information):fam edu completed  Summary of Stay    Date/Time Discharge Planning CSW  02/06/24 0956 Pt will d/c to home with his wife who is primary caregiver. SW will confirm there are no barriers to discharge. AAC  01/30/24 1328 TBA AAC       Kela Baccari A Jude

## 2024-02-07 NOTE — Progress Notes (Signed)
 Inpatient Rehabilitation Discharge Medication Review by a Pharmacist  A complete drug regimen review was completed for this patient to identify any potential clinically significant medication issues.  High Risk Drug Classes Is patient taking? Indication by Medication  Antipsychotic No   Anticoagulant No   Antibiotic No   Opioid No   Antiplatelet Yes ASA, plavix - CVA ppx  Hypoglycemics/insulin No   Vasoactive Medication Yes Amiodarone - rhythm control [AF w/ RVR Toprol  XL- rate control  Chemotherapy No   Other Yes Lipitor- HLD Pepcid /protonix - GERD Melatonin- sleep     Type of Medication Issue Identified Description of Issue Recommendation(s)  Drug Interaction(s) (clinically significant)     Duplicate Therapy     Allergy     No Medication Administration End Date     Incorrect Dose     Additional Drug Therapy Needed     Significant med changes from prior encounter (inform family/care partners about these prior to discharge).    Other       Clinically significant medication issues were identified that warrant physician communication and completion of prescribed/recommended actions by midnight of the next day:  No   Time spent performing this drug regimen review (minutes):  30  Pharmacist comment: Not a candidate for [facility] [AF] 2/2 fall/SDH history  Yilin Weedon BS, PharmD, BCPS Clinical Pharmacist 02/07/2024 7:36 AM  Contact: [phone] after 3 PM

## 2024-02-07 NOTE — Progress Notes (Signed)
 PROGRESS NOTE   Subjective/Complaints:  No events overnight.  No acute complaints.  Vitals remained stable.  Continues with frequent nocturia, no PVRs overnight.  A.m. labs much improved after IV fluid, creatinine 1.2, BUN 30, DC IV fluids.   ROS: Denies fevers, chills, N/V, abdominal pain,  diarrhea, SOB, cough, chest pain, new weakness or paraesthesias.    + Nocturia--ongoing + Mild constipation  Objective:   No results found. Recent Labs    02/05/24 0512  WBC 7.3  HGB 11.4*  HCT 36.0*  PLT 342   Recent Labs    02/06/24 0516 02/07/24 0443  NA 138 136  K 4.3 4.3  CL 107 106  CO2 21* 23  GLUCOSE 100* 98  BUN 29* 30*  CREATININE 1.32* 1.21  CALCIUM  8.9 8.9    Intake/Output Summary (Last 24 hours) at 02/07/2024 0811 Last data filed at 02/07/2024 9371 Gross per 24 hour  Intake 236 ml  Output 1000 ml  Net -764 ml     Wound 01/29/24 1823 Pressure Injury Buttocks Left Stage 3 -  Full thickness tissue loss. Subcutaneous fat may be visible but bone, tendon or muscle are NOT exposed. (Active)     Wound 01/29/24 1824 Pressure Injury Sacrum Lower Stage 1 -  Intact skin with non-blanchable redness of a localized area usually over a bony prominence. (Active)     Wound 01/30/24 1039 Pressure Injury Heel Left Unstageable - Full thickness tissue loss in which the base of the injury is covered by slough (yellow, tan, gray, green or brown) and/or eschar (tan, brown or black) in the wound bed. (Active)     Wound 01/30/24 1040 Pressure Injury Foot Left;Lateral Unstageable - Full thickness tissue loss in which the base of the injury is covered by slough (yellow, tan, gray, green or brown) and/or eschar (tan, brown or black) in the wound bed. (Active)    Physical Exam: Vital Signs Blood pressure (!) 122/59, pulse 65, temperature 98 F (36.7 C), temperature source Oral, resp. rate 17, height 5' 5 (1.651 m), weight 71.7 kg,  SpO2 99%. Constitutional: No apparent distress. Appropriate appearance for age.  Laying in bed. HENT: No JVD. Neck Supple. Trachea midline. Atraumatic, normocephalic. Eyes: PERRLA. EOMI. Visual fields grossly intact.  Cardiovascular: RRR, no murmurs/rub/gallops. No Edema. Peripheral pulses 2+  Respiratory: CTAB. No rales, rhonchi, or wheezing. On RA.  Abdomen: + bowel sounds, normoactive. No distention or tenderness.   Skin: C/D/I. No apparent lesions. Sacral DTI, heel wounds as pictured below.  Images from 12/09          MSK:      No apparent deformity.    Neurologic exam:  Patient is alert, alert, and oriented x 3.  He is hard of hearing.  CN 2 through 12 grossly intact MMT: 4+/5 bilateral UE prox to distal. BLE 4-/5 HF, KE and 4/5 ADF/PF.  Sensory exam normal for light touch and pain in all 4 limbs.  No limb ataxia or cerebellar signs.  No abnormal tone appreciated.      Physical exam unchanged from the above on reexamination 02/07/2024       Assessment/Plan: 1. Functional deficits which require 3+ hours  per day of interdisciplinary therapy in a comprehensive inpatient rehab setting. Physiatrist is providing close team supervision and 24 hour management of active medical problems listed below. Physiatrist and rehab team continue to assess barriers to discharge/monitor patient progress toward functional and medical goals  Care Tool:  Bathing    Body parts bathed by patient: Right arm, Left arm, Chest, Abdomen, Front perineal area, Right upper leg, Left upper leg, Face, Buttocks, Right lower leg, Left lower leg   Body parts bathed by helper: Right lower leg, Left lower leg     Bathing assist Assist Level: Supervision/Verbal cueing     Upper Body Dressing/Undressing Upper body dressing   What is the patient wearing?: Pull over shirt    Upper body assist Assist Level: Independent with assistive device    Lower Body Dressing/Undressing Lower body dressing       What is the patient wearing?: Pants, Underwear/pull up     Lower body assist Assist for lower body dressing: Supervision/Verbal cueing     Toileting Toileting    Toileting assist Assist for toileting: Supervision/Verbal cueing     Transfers Chair/bed transfer  Transfers assist     Chair/bed transfer assist level: Contact Guard/Touching assist     Locomotion Ambulation   Ambulation assist      Assist level: Contact Guard/Touching assist Assistive device: Walker-rolling Max distance: 180'   Walk 10 feet activity   Assist     Assist level: Contact Guard/Touching assist Assistive device: Walker-rolling   Walk 50 feet activity   Assist    Assist level: Contact Guard/Touching assist Assistive device: Walker-rolling    Walk 150 feet activity   Assist    Assist level: Contact Guard/Touching assist Assistive device: Walker-rolling    Walk 10 feet on uneven surface  activity   Assist     Assist level: Minimal Assistance - Patient > 75% Assistive device: Other (comment) (no device)   Wheelchair     Assist Is the patient using a wheelchair?: Yes Type of Wheelchair: Manual    Wheelchair assist level: Dependent - Patient 0%      Wheelchair 50 feet with 2 turns activity    Assist        Assist Level: Dependent - Patient 0%   Wheelchair 150 feet activity     Assist      Assist Level: Dependent - Patient 0%   Blood pressure (!) 122/59, pulse 65, temperature 98 F (36.7 C), temperature source Oral, resp. rate 17, height 5' 5 (1.651 m), weight 71.7 kg, SpO2 99%.   Medical Problem List and Plan: 1. Functional deficits secondary to septic shock secondary to acute pyelonephritis and obstructive uropathy with E. coli bacteremia.  Completing course of IV ceftriaxone              -patient may shower             -ELOS/Goals: 10-14 days, supervision goals with PT, and sup/min with OT - 12/18 DC date   - 12/9: CGA today  Stable  to continue inpatient rehab  2.  Antithrombotics: -DVT/anticoagulation/right upper extremity SVT 12/3: Supportive care.  Mechanical: Antiembolism stockings, thigh (TED hose) Bilateral lower extremities             -antiplatelet therapy: Aspirin  81 mg daily and Plavix  75 mg daily 3. Pain Management:  Tylenol  as needed             -denied pain today  - 12-9: Monitor Tylenol  use due to mild LFT  elevation; seems rare, so unlikely contributor.  Likely hypoperfusion with sepsis.  Trend with next labs.  12-11: LFTs downtrending.  Monit0r-->improving 12/15     4. Mood/Behavior/Sleep: Melatonin 5 mg nightly as needed             -antipsychotic agents: N/A 5. Neuropsych/cognition: This patient is capable of making decisions on his own behalf. 6. Skin/Wound Care/WOC follow-up: Pressure injury to the sacrum  - Offloading for sacrum, heals with Mepilex 7. Fluids/Electrolytes/Nutrition: Routine IN and outs with follow-up chemistries             -decreased nutritional storage--encourage PO, appetite is not great but it's picking up per son  - 12-9: Eating okay, monitor  -12/14 eating most of his meals, continue to monitor  12-16: Encouraging p.o. fluids, to drink 6 cups of water  today, hopeful to DC IV fluids tomorrow.   8.  Bilateral ureteral stones/left ureteral obstruction/urinary urgency.  Status post bilateral ureteral stents 11/29 per Dr.Wrenn.  Foley tube in place             -has persistent hematuria and sediment   - 12-10: DC Foley catheter trial today. Will get UA per family req with first void for lethargy.   12-11: Voiding well, UA with some bacteria and leukocyte esterase, WBCs so we will start Keflex  500 mg twice daily for 7 days and follow cultures.  Timed toileting given urinary frequency  - 12-12: Pending urine culture results.  Voiding well.  Still complaining of severe nocturia, was not on Flomax  prior to admission, DC Flomax  0.4 mg, continue PVRs through the weekend.  -12/13 urine  culture with Staph epidermidis, discussed with pharmacy antibiotic changed to doxycycline  100 mg twice daily  -12/14 Recheck CBC tomorrow, continues to have increased urination frequency at night, does not appear to have recent PVRs completed discussed with nursing who will check this  12/15: CBC improved, PVRs low--apparently started on Doxy over the weekend for Staph Epi 40K colonies - not clear infection but given reacent Hx reasonable to finish treatment  9.  Acute metabolic encephalopathy secondary to sepsis and acute infection.  Resolved with treatment. He is HOH 10.  New onset HFrEF.  Echocardiogram LVEF 35 to 40% with global hypokinesis.  Patient received IV diuresis and completed.  Elevated troponin felt to be related to demand ischemia trending down to 125   - no s/s volume overload Filed Weights   01/29/24 1829 02/04/24 0728 02/07/24 0456  Weight: 70.8 kg 72.6 kg 71.7 kg   - add daily weight 12/12  - Repeat weight pending-will asked nursing to complete, no signs of fluid overload noted 12/15: Weight slightly up per log, no external s/s volume overload, monitor--stable appearance 12-16   11.  Lactic acidosis.  Resolved with management of sepsis 12.  AKI.  Baseline creatinine 0.9.  Creatinine 1.75 with peak of 2.16.  Complicated by mild hydronephrosis from ureteral obstruction.  Creatinine improved AKI resolved.  Follow-up chemistries   - 12/9: labs with stable BUN but mildly uptrending creatinine; encourage p.o. fluids.  - 12-11: BMP with increasing creatinine, downtrending BUN.  Patient drinking significantly less than he was on acute due to urinary frequency, adjusting Flomax  as above, encourage p.o. fluids and monitor Monday.  -12/13 continue encourage fluid intake  12.15: AKI Cr 1.3 - gentle IVF today 50 cc/hr for 1 L total, repeat in AM  12-16: Small improvement, creatinine 1.32, BUN 29.  Increase IV fluids to 75 cc/h, repeat BMP in AM.  Patient also to drink more fluids  today.  12-17: Creatinine down, DC IV fluids.  Continue to encourage adequate hydration.  13.  History of PAD.  Patient manage on DAPT for stent x 2 at SFA/popliteal artery.  Antiplatelets held on admission secondary to sepsis and thrombocytopenia and resumed 01/27/2024 14.  New onset atrial fibrillation.  Follow-up cardiology services.  Continue Toprol -XL 50 mg daily   - Regular rate and rhythm on exam  15.  History of subdural hematoma and received CIR.  Cranial CT scan 11/28 showing no acute abnormality 16.  Hypertension.  Monitor with increased mobility  - Normotensive, vital stable.  Monitor.  -12/13-14 BP a little soft but overall controlled continue to monitor trend    02/07/2024    4:56 AM 02/06/2024    7:31 PM 02/06/2024    1:41 PM  Vitals with BMI  Weight 158 lbs    BMI 26.29    Systolic 122 114 884  Diastolic 59 62 55  Pulse 65 70 71     17.  Recent right clavicle fracture.  Advanced to weightbearing as tolerated. 18.  Hyperlipidemia.  Lipitor 19.  Constipation.  MiraLAX  daily, Senokot S1 tablet nightly             -had BM just before I came to see him today  LBM 12/15   20.  Intractable hiccups--resolved             -dc gabapentin              -trial of low-dose baclofen  5mg  bid. Observe for effect/tolerance 12-9: Hiccups continue, remain bothersome. Start protonix  40 mg daily, monitor on baclofen  today--monitor 12/10: Hiccups resolved; make baclofen  PRN--dc 12/16    21. Sore throat. S/p extubation. Add cepacol lozenges PRN  - 12-11: Sore throat improving, with mild cough.  Encouraged incentive spirometer.  No concerning signs on exam.  Consider chest x-ray if no improvement.  12-12: Encouraged use of Cepacol lozenges.  Patient continues to have sore throat, but no other symptoms.  -12/14 reports Improving  LOS: 9 days A FACE TO FACE EVALUATION WAS PERFORMED  Joesph JAYSON Likes 02/07/2024, 8:11 AM

## 2024-02-07 NOTE — Progress Notes (Signed)
 Physical Therapy Session Note  Patient Details  Name: Jerry [name] MRN: [id number] Date of Birth: 1928-08-27  Today's Date: 02/07/2024 PT Individual Time: 0917-1029 PT Individual Time Calculation (min): 72 min   Short Term Goals: Week 1:  PT Short Term Goal 1 (Week 1): STG = LTG due to ELOS  Skilled Therapeutic Interventions/Progress Updates:     Pt received seated in [facility] and agrees to therapy. Reports pain in throat from the tube that was down my throat. PT provides education on rationale for pain and likely improvement of symptoms. Pt ambulates x125' to gym with rollator and cues for posture and increasing proximity to AD for safety. Pt completes Nustep activity for endurance training. Pt completes for x10:00 at workload of 5 with average steps per minute ~39. PT provides cues for hand and foot placement and completing full available ROM. Pt takes rest break following activity.   Pt ambulates x300' with rollator and same cues, then completes ramp navigation and car transfer with rollator and cues for safe sequencing. Following rest break, pt ambulates to main gym. Seated rest break. Pt completes x12 6' steps with bilateral handrails and cues for safe step placement.   Pt completes TUG test with rollator, with following times: 11.3 seconds, 10.5 seconds, and 10.0 seconds. Pt's average time was 10.6 seconds, improved from 32.9 seconds when attempted one week ago, indicating significant decrease in risk for falls and improvement in balance.   Pt completes Berg balance test, as detailed in DC note. PT provides education on results and interprets score for pt. Pt takes brief seated rest break prior to ambulating back to room with rollator. Left seated with all needs within reach.   Therapy Documentation Precautions:  Precautions Precautions: Fall Recall of Precautions/Restrictions: Impaired Precaution/Restrictions Comments: HOH Restrictions Weight Bearing Restrictions Per Provider  Order: Yes RUE Weight Bearing Per Provider Order: Weight bearing as tolerated    Therapy/Group: Individual Therapy  Elsie JAYSON Dawn, PT, DPT 02/07/2024, 4:47 PM

## 2024-02-07 NOTE — Progress Notes (Signed)
 Occupational Therapy Session Note  Patient Details  Name: Jerry [name] MRN: [id number] Date of Birth: [birth year]  Today's Date: 02/07/2024 OT Individual Time: 8949-8848 OT Individual Time Calculation (min): 61 min   Today's Date: 02/07/2024 OT Individual Time: 1435-1530 OT Individual Time Calculation (min): 55 min   Short Term Goals: Week 1:  OT Short Term Goal 1 (Week 1): STGs=LTGs due to patient's ELOS.  Skilled Therapeutic Interventions/Progress Updates:   Session 1: Pt greeted sitting in [facility] for skilled OT session with focus on general conditioning.   Pain: Pt with chronic reports of throat soreness, OT offering intermediate rest breaks and positioning suggestions throughout session to address pain/fatigue and maximize participation/safety in session.   Functional Transfers: Ambulatory transfers this session with supervision, safety cues provided to bring rollator into bathroom vs parking it outside door.   Self Care Tasks: 3/3 toileting tasks with distant supervision.   Therapeutic Exercise: Standing at parallel bars, pt instructed in the following exercises to target BLE strengthening/endurance for decreased fall risk and continued independence with ADLs, details below: 2 x 1 min cycles of marches 2 x 1 min cycles of knee flexion/extension 2 x 1 min cycles of calf raises  2 x 1 min cycles of leg abductions  Multimodal cuing provided for correct form/posture, supervision provided for balance with BUE supported on parallel bar.   Pt completes 2 x 4 mins of Scifit modality for BUE strengthening/endurance, standing with close supervision. Resistance level ranging from levels 4-5.   Pt remained sitting in WC with 4Ps assessed and immediate needs met. Pt continues to be appropriate for skilled OT intervention to promote further functional independence in ADLs/IADLs.   Session 2: Pt greeted sitting in [facility] for skilled OT session with focus on general conditioning, functional  transfers, and functional mobility.   Pain: Pt with no reports of pain. OT offering intermediate rest breaks and positioning suggestions throughout session to address pain/fatigue and maximize participation/safety in session.   Functional Transfers: Ambulatory transfers with CGA + no AD, multiple instances of toe catching with CGA-Min A to recover.   Therapeutic Activities: Pt instructed the following activities targeting functional mobility, activity tolerance, and dynamic standing balance, details below: Greater than household level ambulation with ankle weights donned (R=2.5# & L=2#).  Soccer ball dribble with 4# medicine ball further targeting BLE coordination, intermediate Min A provided.  Corn hole activity with emphasis on standing tolerance and functional reach to retrieve bean bags, CGA provided throughout with no LOB + no AD   Education: Re-educated spouse and daughter on need to use rollator with all mobility.   Pt remained sitting in WC with 4Ps assessed and immediate needs met. Pt continues to be appropriate for skilled OT intervention to promote further functional independence in ADLs/IADLs.   Therapy Documentation Precautions:  Precautions Precautions: Fall Recall of Precautions/Restrictions: Impaired Precaution/Restrictions Comments: HOH Restrictions Weight Bearing Restrictions Per Provider Order: Yes RUE Weight Bearing Per Provider Order: Weight bearing as tolerated   Therapy/Group: Individual Therapy  Nereida Habermann, OTR/L, MSOT  02/07/2024, 6:09 AM

## 2024-02-08 ENCOUNTER — Other Ambulatory Visit (HOSPITAL_COMMUNITY): Payer: Self-pay

## 2024-02-08 LAB — BASIC METABOLIC PANEL WITH GFR
Anion gap: 7 (ref 5–15)
BUN: 28 mg/dL — ABNORMAL HIGH (ref 8–23)
CO2: 25 mmol/L (ref 22–32)
Calcium: 9.1 mg/dL (ref 8.9–10.3)
Chloride: 103 mmol/L (ref 98–111)
Creatinine, Ser: 1.16 mg/dL (ref 0.61–1.24)
GFR, Estimated: 59 mL/min — ABNORMAL LOW (ref >60)
Glucose, Bld: 92 mg/dL (ref 70–99)
Potassium: 4.6 mmol/L (ref 3.5–5.1)
Sodium: 134 mmol/L — ABNORMAL LOW (ref 135–145)

## 2024-02-08 LAB — CBC
HCT: 33.4 % — ABNORMAL LOW (ref 39.0–52.0)
Hemoglobin: 10.8 g/dL — ABNORMAL LOW (ref 13.0–17.0)
MCH: 30.9 pg (ref 26.0–34.0)
MCHC: 32.3 g/dL (ref 30.0–36.0)
MCV: 95.7 fL (ref 80.0–100.0)
Platelets: 498 K/uL — ABNORMAL HIGH (ref 150–400)
RBC: 3.49 MIL/uL — ABNORMAL LOW (ref 4.22–5.81)
RDW: 14.6 % (ref 11.5–15.5)
WBC: 6.6 K/uL (ref 4.0–10.5)
nRBC: 0 % (ref 0.0–0.2)

## 2024-02-08 MED ORDER — GERHARDT'S BUTT CREAM
TOPICAL_CREAM | Freq: Two times a day (BID) | CUTANEOUS | Status: DC
  Filled 2024-02-08: qty 60

## 2024-02-08 MED FILL — Hydrocortisone Cream 1%: 1.0000 | CUTANEOUS | 30 days supply | Qty: 60 | Fill #0 | Status: AC

## 2024-02-08 NOTE — Progress Notes (Signed)
 Wife and daughter educated and performed wound care dressing change . Supplies given to patient

## 2024-02-08 NOTE — Plan of Care (Signed)
°  Problem: Consults Goal: RH GENERAL PATIENT EDUCATION Description: See Patient Education module for education specifics. Outcome: Progressing   Problem: RH BOWEL ELIMINATION Goal: RH STG MANAGE BOWEL WITH ASSISTANCE Description: STG Manage Bowel with minimal  Assistance. Outcome: Progressing   Problem: RH BLADDER ELIMINATION Goal: RH STG MANAGE BLADDER WITH ASSISTANCE Description: STG Manage Bladder With min Assistance Outcome: Progressing   Problem: RH SKIN INTEGRITY Goal: RH STG SKIN FREE OF INFECTION/BREAKDOWN Description: Mange skin free of infection with min assistance Outcome: Progressing   Problem: RH SAFETY Goal: RH STG ADHERE TO SAFETY PRECAUTIONS W/ASSISTANCE/DEVICE Description: STG Adhere to Safety Precautions With min Assistance/Device. Outcome: Progressing   Problem: RH PAIN MANAGEMENT Goal: RH STG PAIN MANAGED AT OR BELOW PT'S PAIN GOAL Description: <4 w/ prns Outcome: Progressing   Problem: RH KNOWLEDGE DEFICIT GENERAL Goal: RH STG INCREASE KNOWLEDGE OF SELF CARE AFTER HOSPITALIZATION Description: Manage increase knowledge of self care after hospitalization with min assistance from spouse using educational materials provided Outcome: Progressing

## 2024-02-08 NOTE — Progress Notes (Signed)
 PROGRESS NOTE   Subjective/Complaints:  No events overnight.  No acute complaints.  Vitals remained stable.  Labs this a.m. with downtrending creatinine, BUN, doing well with drinking fluids.  Ready for discharge ROS: Denies fevers, chills, N/V, abdominal pain,  diarrhea, SOB, cough, chest pain, new weakness or paraesthesias.    + Nocturia--ongoing   Objective:   No results found. Recent Labs    02/08/24 0516  WBC 6.6  HGB 10.8*  HCT 33.4*  PLT 498*   Recent Labs    02/07/24 0443 02/08/24 0516  NA 136 134*  K 4.3 4.6  CL 106 103  CO2 23 25  GLUCOSE 98 92  BUN 30* 28*  CREATININE 1.21 1.16  CALCIUM  8.9 9.1    Intake/Output Summary (Last 24 hours) at 02/08/2024 0856 Last data filed at 02/08/2024 0536 Gross per 24 hour  Intake 590 ml  Output 850 ml  Net -260 ml     Wound 01/29/24 1823 Pressure Injury Buttocks Left Stage 3 -  Full thickness tissue loss. Subcutaneous fat may be visible but bone, tendon or muscle are NOT exposed. (Active)     Wound 01/29/24 1824 Pressure Injury Sacrum Lower Stage 1 -  Intact skin with non-blanchable redness of a localized area usually over a bony prominence. (Active)     Wound 01/30/24 1039 Pressure Injury Heel Left Unstageable - Full thickness tissue loss in which the base of the injury is covered by slough (yellow, tan, gray, green or brown) and/or eschar (tan, brown or black) in the wound bed. (Active)     Wound 01/30/24 1040 Pressure Injury Foot Left;Lateral Unstageable - Full thickness tissue loss in which the base of the injury is covered by slough (yellow, tan, gray, green or brown) and/or eschar (tan, brown or black) in the wound bed. (Active)    Physical Exam: Vital Signs Blood pressure (!) 141/70, pulse 71, temperature 97.8 F (36.6 C), temperature source Oral, resp. rate 17, height 5' 5 (1.651 m), weight 69.4 kg, SpO2 99%. Constitutional: No apparent distress.  Appropriate appearance for age.  Sitting up in wheelchair HENT: No JVD. Neck Supple. Trachea midline. Atraumatic, normocephalic. Eyes: PERRLA. EOMI. Visual fields grossly intact.  Cardiovascular: RRR, no murmurs/rub/gallops. No Edema. Peripheral pulses 2+  Respiratory: CTAB. No rales, rhonchi, or wheezing. On RA.  Abdomen: + bowel sounds, normoactive. No distention or tenderness.   Skin: C/D/I. No apparent lesions. Sacral DTI, heel wounds --stable at discharge          MSK:      No apparent deformity.    Neurologic exam:  Patient is alert, alert, and oriented x 3.  He is hard of hearing.  CN 2 through 12 grossly intact MMT: 5 out of 5 bilateral upper and lower extremities Sensory exam normal for light touch and pain in all 4 limbs.  No limb ataxia or cerebellar signs.  No abnormal tone appreciated.      Assessment/Plan: 1. Functional deficits which require 3+ hours per day of interdisciplinary therapy in a comprehensive inpatient rehab setting. Physiatrist is providing close team supervision and 24 hour management of active medical problems listed below. Physiatrist and rehab team continue to  assess barriers to discharge/monitor patient progress toward functional and medical goals  Care Tool:  Bathing    Body parts bathed by patient: Right arm, Left arm, Chest, Abdomen, Front perineal area, Right upper leg, Left upper leg, Face, Buttocks, Right lower leg, Left lower leg   Body parts bathed by helper: Right lower leg, Left lower leg     Bathing assist Assist Level: Supervision/Verbal cueing     Upper Body Dressing/Undressing Upper body dressing   What is the patient wearing?: Pull over shirt    Upper body assist Assist Level: Independent with assistive device    Lower Body Dressing/Undressing Lower body dressing      What is the patient wearing?: Pants, Underwear/pull up     Lower body assist Assist for lower body dressing: Supervision/Verbal cueing      Toileting Toileting    Toileting assist Assist for toileting: Supervision/Verbal cueing     Transfers Chair/bed transfer  Transfers assist     Chair/bed transfer assist level: Supervision/Verbal cueing     Locomotion Ambulation   Ambulation assist      Assist level: Supervision/Verbal cueing Assistive device: Rollator Max distance: 300'   Walk 10 feet activity   Assist     Assist level: Supervision/Verbal cueing Assistive device: Rollator   Walk 50 feet activity   Assist    Assist level: Supervision/Verbal cueing Assistive device: Rollator    Walk 150 feet activity   Assist    Assist level: Supervision/Verbal cueing Assistive device: Rollator    Walk 10 feet on uneven surface  activity   Assist     Assist level: Supervision/Verbal cueing Assistive device: Rollator   Wheelchair     Assist Is the patient using a wheelchair?: No Type of Wheelchair: Manual    Wheelchair assist level: Dependent - Patient 0%      Wheelchair 50 feet with 2 turns activity    Assist        Assist Level: Dependent - Patient 0%   Wheelchair 150 feet activity     Assist      Assist Level: Dependent - Patient 0%   Blood pressure (!) 141/70, pulse 71, temperature 97.8 F (36.6 C), temperature source Oral, resp. rate 17, height 5' 5 (1.651 m), weight 69.4 kg, SpO2 99%.   Medical Problem List and Plan: 1. Functional deficits secondary to septic shock secondary to acute pyelonephritis and obstructive uropathy with E. coli bacteremia.  Completing course of IV ceftriaxone              -patient may shower             -ELOS/Goals: 10-14 days, supervision goals with PT, and sup/min with OT - 12/18 DC date   - 12/9: CGA today  Stable to continue inpatient rehab The patient is medically ready for discharge to home and will not need follow-up with [facility]. In addition, they will need to follow up with their PCP, urology and cardiology.   2.   Antithrombotics: -DVT/anticoagulation/right upper extremity SVT 12/3: Supportive care.  Mechanical: Antiembolism stockings, thigh (TED hose) Bilateral lower extremities             -antiplatelet therapy: Aspirin  81 mg daily and Plavix  75 mg daily 3. Pain Management:  Tylenol  as needed             -denied pain today  - 12-9: Monitor Tylenol  use due to mild LFT elevation; seems rare, so unlikely contributor.  Likely hypoperfusion with sepsis.  Trend  with next labs.  12-11: LFTs downtrending.  Monit0r-->improving 12/15     4. Mood/Behavior/Sleep: Melatonin 5 mg nightly as needed             -antipsychotic agents: N/A 5. Neuropsych/cognition: This patient is capable of making decisions on his own behalf. 6. Skin/Wound Care/WOC follow-up: Pressure injury to the sacrum  - Offloading for sacrum, heals with Mepilex 7. Fluids/Electrolytes/Nutrition: Routine IN and outs with follow-up chemistries             -decreased nutritional storage--encourage PO, appetite is not great but it's picking up per son  - 12-9: Eating okay, monitor  -12/14 eating most of his meals, continue to monitor  12-16: Encouraging p.o. fluids, to drink 6 cups of water  today, hopeful to DC IV fluids tomorrow.--Did well with this   8.  Bilateral ureteral stones/left ureteral obstruction/urinary urgency.  Status post bilateral ureteral stents 11/29 per Dr.Wrenn.  Foley tube in place             -has persistent hematuria and sediment   - 12-10: DC Foley catheter trial today. Will get UA per family req with first void for lethargy.   12-11: Voiding well, UA with some bacteria and leukocyte esterase, WBCs so we will start Keflex  500 mg twice daily for 7 days and follow cultures.  Timed toileting given urinary frequency  - 12-12: Pending urine culture results.  Voiding well.  Still complaining of severe nocturia, was not on Flomax  prior to admission, DC Flomax  0.4 mg, continue PVRs through the weekend.  -12/13 urine culture with  Staph epidermidis, discussed with pharmacy antibiotic changed to doxycycline  100 mg twice daily  -12/14 Recheck CBC tomorrow, continues to have increased urination frequency at night, does not appear to have recent PVRs completed discussed with nursing who will check this  12/15: CBC improved, PVRs low--apparently started on Doxy over the weekend for Staph Epi 40K colonies - not clear infection but given reacent Hx reasonable to finish treatment  9.  Acute metabolic encephalopathy secondary to sepsis and acute infection.  Resolved with treatment. He is HOH 10.  New onset HFrEF.  Echocardiogram LVEF 35 to 40% with global hypokinesis.  Patient received IV diuresis and completed.  Elevated troponin felt to be related to demand ischemia trending down to 125   - no s/s volume overload Filed Weights   02/04/24 0728 02/07/24 0456 02/08/24 0409  Weight: 72.6 kg 71.7 kg 69.4 kg   - add daily weight 12/12  - Repeat weight pending-will asked nursing to complete, no signs of fluid overload noted 12/15: Weight slightly up per log, no external s/s volume overload, monitor--stable appearance 12-16,-18   11.  Lactic acidosis.  Resolved with management of sepsis 12.  AKI.  Baseline creatinine 0.9.  Creatinine 1.75 with peak of 2.16.  Complicated by mild hydronephrosis from ureteral obstruction.  Creatinine improved AKI resolved.  Follow-up chemistries   - 12/9: labs with stable BUN but mildly uptrending creatinine; encourage p.o. fluids.  - 12-11: BMP with increasing creatinine, downtrending BUN.  Patient drinking significantly less than he was on acute due to urinary frequency, adjusting Flomax  as above, encourage p.o. fluids and monitor Monday.  -12/13 continue encourage fluid intake  12.15: AKI Cr 1.3 - gentle IVF today 50 cc/hr for 1 L total, repeat in AM  12-16: Small improvement, creatinine 1.32, BUN 29.  Increase IV fluids to 75 cc/h, repeat BMP in AM.  Patient also to drink more fluids today.  12-17:  Creatinine down, DC IV fluids.  Continue to encourage adequate hydration.  12/18: Creatinine continues to downtrend, push p.o. fluids at home.  Discussed with family at discharge.  13.  History of PAD.  Patient manage on DAPT for stent x 2 at SFA/popliteal artery.  Antiplatelets held on admission secondary to sepsis and thrombocytopenia and resumed 01/27/2024 14.  New onset atrial fibrillation.  Follow-up cardiology services.  Continue Toprol -XL 50 mg daily   - Regular rate and rhythm on exam  15.  History of subdural hematoma and received CIR.  Cranial CT scan 11/28 showing no acute abnormality 16.  Hypertension.  Monitor with increased mobility  - Normotensive, vital stable.  Monitor.  -12/13-14 BP a little soft but overall controlled continue to monitor trend    02/08/2024    4:09 AM 02/07/2024    7:29 PM 02/07/2024    2:01 PM  Vitals with BMI  Weight 153 lbs    BMI 25.46    Systolic 141 117 896  Diastolic 70 68 57  Pulse 71 59 76     17.  Recent right clavicle fracture.  Advanced to weightbearing as tolerated. 18.  Hyperlipidemia.  Lipitor 19.  Constipation.  MiraLAX  daily, Senokot S1 tablet nightly             -had BM just before I came to see him today  LBM 12/15   20.  Intractable hiccups--resolved             -dc gabapentin              -trial of low-dose baclofen  5mg  bid. Observe for effect/tolerance 12-9: Hiccups continue, remain bothersome. Start protonix  40 mg daily, monitor on baclofen  today--monitor 12/10: Hiccups resolved; make baclofen  PRN--dc 12/16    21. Sore throat. S/p extubation. Add cepacol lozenges PRN  - 12-11: Sore throat improving, with mild cough.  Encouraged incentive spirometer.  No concerning signs on exam.  Consider chest x-ray if no improvement.  12-12: Encouraged use of Cepacol lozenges.  Patient continues to have sore throat, but no other symptoms.  -12/14 reports Improving  LOS: 10 days A FACE TO FACE EVALUATION WAS PERFORMED  Joesph JAYSON Likes 02/08/2024, 8:56 AM
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
## 2024-02-29 ENCOUNTER — Ambulatory Visit (INDEPENDENT_AMBULATORY_CARE_PROVIDER_SITE_OTHER): Admitting: Nurse Practitioner

## 2024-02-29 ENCOUNTER — Encounter (HOSPITAL_BASED_OUTPATIENT_CLINIC_OR_DEPARTMENT_OTHER): Payer: Self-pay | Admitting: Nurse Practitioner

## 2024-02-29 VITALS — BP 100/56 | HR 61 | Ht 65.0 in | Wt 154.0 lb

## 2024-02-29 DIAGNOSIS — I5020 Unspecified systolic (congestive) heart failure: Secondary | ICD-10-CM

## 2024-02-29 DIAGNOSIS — S065X0D Traumatic subdural hemorrhage without loss of consciousness, subsequent encounter: Secondary | ICD-10-CM | POA: Diagnosis not present

## 2024-02-29 DIAGNOSIS — I7143 Infrarenal abdominal aortic aneurysm, without rupture: Secondary | ICD-10-CM | POA: Diagnosis not present

## 2024-02-29 DIAGNOSIS — Z01810 Encounter for preprocedural cardiovascular examination: Secondary | ICD-10-CM | POA: Diagnosis not present

## 2024-02-29 DIAGNOSIS — I739 Peripheral vascular disease, unspecified: Secondary | ICD-10-CM

## 2024-02-29 DIAGNOSIS — Z5181 Encounter for therapeutic drug level monitoring: Secondary | ICD-10-CM | POA: Diagnosis not present

## 2024-02-29 DIAGNOSIS — I428 Other cardiomyopathies: Secondary | ICD-10-CM

## 2024-02-29 DIAGNOSIS — I480 Paroxysmal atrial fibrillation: Secondary | ICD-10-CM

## 2024-02-29 DIAGNOSIS — Z79899 Other long term (current) drug therapy: Secondary | ICD-10-CM

## 2024-02-29 MED ORDER — PANTOPRAZOLE SODIUM 40 MG PO TBEC: 40.0000 mg | DELAYED_RELEASE_TABLET | Freq: Every day | ORAL | 0 refills | Status: AC

## 2024-02-29 MED ORDER — METOPROLOL SUCCINATE ER 50 MG PO TB24: 50.0000 mg | ORAL_TABLET | Freq: Every day | ORAL | 3 refills | Status: AC

## 2024-02-29 MED ORDER — AMIODARONE HCL 200 MG PO TABS: 200.0000 mg | ORAL_TABLET | Freq: Every day | ORAL | 3 refills | Status: AC

## 2024-02-29 NOTE — Patient Instructions (Signed)
 Medication Instructions:   DECREASE Amiodarone  one (1) tablet by mouth ( 200 mg).  *If you need a refill on your cardiac medications before your next appointment, please call your pharmacy*  Lab Work:  None ordered.   If you have labs (blood work) drawn today and your tests are completely normal, you will receive your results only by: MyChart Message (if you have MyChart) OR A paper copy in the mail If you have any lab test that is abnormal or we need to change your treatment, we will call you to review the results.  Testing/Procedures:  None ordered.   Follow-Up: At [facility], you and your health needs are our priority.  As part of our continuing mission to provide you with exceptional heart care, our providers are all part of one team.  This team includes your primary Cardiologist (physician) and Advanced Practice Providers or APPs (Physician Assistants and Nurse Practitioners) who all work together to provide you with the care you need, when you need it.  Your next appointment:   2 month(s)  Provider:   Lonni Meager, NP    We recommend signing up for the patient portal called MyChart.  Sign up information is provided on this After Visit Summary.  MyChart is used to connect with patients for Virtual Visits (Telemedicine).  Patients are able to view lab/test results, encounter notes, upcoming appointments, etc.  Non-urgent messages can be sent to your provider as well.   To learn more about what you can do with MyChart, go to [email or web address].   Other Instructions   Dr. Darron Dr. End Dr. [place]

## 2024-03-01 ENCOUNTER — Encounter (HOSPITAL_BASED_OUTPATIENT_CLINIC_OR_DEPARTMENT_OTHER): Payer: Self-pay | Admitting: Nurse Practitioner

## 2024-03-04 ENCOUNTER — Other Ambulatory Visit (HOSPITAL_BASED_OUTPATIENT_CLINIC_OR_DEPARTMENT_OTHER): Payer: Self-pay | Admitting: *Deleted

## 2024-03-04 ENCOUNTER — Ambulatory Visit: Attending: Nurse Practitioner

## 2024-03-04 ENCOUNTER — Encounter (HOSPITAL_BASED_OUTPATIENT_CLINIC_OR_DEPARTMENT_OTHER): Payer: Self-pay

## 2024-03-04 DIAGNOSIS — I480 Paroxysmal atrial fibrillation: Secondary | ICD-10-CM

## 2024-03-04 NOTE — Progress Notes (Unsigned)
 Enrolled for [employer] to mail a ZIO XT long term holter monitor to the patients address on file.  Requested delivery date of 03/12/24, so patient can apply after surgery. DOD to read/ no cardiologist assigned.

## 2024-03-07 ENCOUNTER — Telehealth: Payer: Self-pay | Admitting: Cardiovascular Disease

## 2024-03-07 NOTE — Telephone Encounter (Signed)
 Called and spoke w patient's daughter.  Explained that APP and Dr. Kate communicated about the best plan for how to manage his atrial fibrillation going forward.    The information about the monitor was sent to patient portal, which he does not access.  The recent login (02/29/24) was her, while she was at his home.  Otherwise it is not used.  She is aware he is not to apply the monitor until after his urologic procedure and the follow up appointment in March will be appropriate timing for review of the plan.  She voices understanding and agreement and thanked me for the information provided.

## 2024-03-07 NOTE — Telephone Encounter (Signed)
 Called and spoke with [name], patient's daughter.  She was concerned that a heart monitor was sent to her father, and no one had mentioned anything about it at their last office visit.  Tried to pull up last office visit notes and the patient was last seen by Rosaline Bane, NP.  Jerry [name] that her message will be forwarded to that provider's office.

## 2024-03-07 NOTE — Telephone Encounter (Signed)
 Daughter Harvest) stated patient received a heart monitor today but patient will be having surgery on 1/20.  Daughter wants a call back to discuss when patient should start heart monitor.

## 2024-03-11 ENCOUNTER — Encounter (HOSPITAL_COMMUNITY): Payer: Self-pay | Admitting: Urology

## 2024-03-11 NOTE — Progress Notes (Signed)
 Spoke w/ via phone for pre-op interview---Don and  Glenda Lab needs dos----  NA       Lab results------ COVID test -----patient states asymptomatic no test needed Arrive at -------0530 NPO after MN NO Solid Food.  Clear liquids from MN until--- Pre-Surgery Ensure or G2:  Med rec completed Medications to take morning of surgery -----Toprol  XL, Amiodorone, Pantoprazole   Diabetic medication -----  GLP1 agonist last dose: GLP1 instructions:  Patient instructed no nail polish to be worn day of surgery Patient instructed to bring photo id and insurance card day of surgery Patient aware to have Driver (ride ) / caregiver    for 24 hours after surgery - Son [name] Carvin Patient Special Instructions -----Hold ASA and Plavix  for 5 days prior to surgery as directed by Doctor Lenon Pre-Op special Instructions -----  Patient verbalized understanding of instructions that were given at this phone interview. Patient denies chest pain, sob, fever, cough at the interview.

## 2024-03-12 ENCOUNTER — Encounter (HOSPITAL_COMMUNITY)
Admission: RE | Disposition: A | Discharge status: Home / Self Care | Payer: Self-pay | Source: Home / Self Care | Attending: Urology

## 2024-03-12 ENCOUNTER — Ambulatory Visit (HOSPITAL_COMMUNITY): Admitting: Anesthesiology

## 2024-03-12 ENCOUNTER — Ambulatory Visit (HOSPITAL_COMMUNITY): Admission: RE | Admit: 2024-03-12 | Discharge: 2024-03-12 | Disposition: A | Attending: Urology | Admitting: Urology

## 2024-03-12 ENCOUNTER — Encounter (HOSPITAL_COMMUNITY): Payer: Self-pay | Admitting: Urology

## 2024-03-12 ENCOUNTER — Ambulatory Visit (HOSPITAL_COMMUNITY)

## 2024-03-12 DIAGNOSIS — I4891 Unspecified atrial fibrillation: Secondary | ICD-10-CM | POA: Diagnosis not present

## 2024-03-12 DIAGNOSIS — E785 Hyperlipidemia, unspecified: Secondary | ICD-10-CM | POA: Insufficient documentation

## 2024-03-12 DIAGNOSIS — Z7901 Long term (current) use of anticoagulants: Secondary | ICD-10-CM | POA: Diagnosis not present

## 2024-03-12 DIAGNOSIS — Z87891 Personal history of nicotine dependence: Secondary | ICD-10-CM | POA: Insufficient documentation

## 2024-03-12 DIAGNOSIS — I509 Heart failure, unspecified: Secondary | ICD-10-CM | POA: Diagnosis not present

## 2024-03-12 DIAGNOSIS — I110 Hypertensive heart disease with heart failure: Secondary | ICD-10-CM | POA: Diagnosis not present

## 2024-03-12 DIAGNOSIS — Z79899 Other long term (current) drug therapy: Secondary | ICD-10-CM | POA: Diagnosis not present

## 2024-03-12 DIAGNOSIS — Z7902 Long term (current) use of antithrombotics/antiplatelets: Secondary | ICD-10-CM | POA: Insufficient documentation

## 2024-03-12 DIAGNOSIS — Z01818 Encounter for other preprocedural examination: Secondary | ICD-10-CM

## 2024-03-12 DIAGNOSIS — M1990 Unspecified osteoarthritis, unspecified site: Secondary | ICD-10-CM | POA: Diagnosis not present

## 2024-03-12 DIAGNOSIS — I739 Peripheral vascular disease, unspecified: Secondary | ICD-10-CM | POA: Diagnosis not present

## 2024-03-12 DIAGNOSIS — N201 Calculus of ureter: Secondary | ICD-10-CM

## 2024-03-12 DIAGNOSIS — N2889 Other specified disorders of kidney and ureter: Secondary | ICD-10-CM | POA: Diagnosis not present

## 2024-03-12 DIAGNOSIS — K219 Gastro-esophageal reflux disease without esophagitis: Secondary | ICD-10-CM | POA: Insufficient documentation

## 2024-03-12 DIAGNOSIS — N200 Calculus of kidney: Secondary | ICD-10-CM | POA: Insufficient documentation

## 2024-03-12 HISTORY — DX: Peripheral vascular disease, unspecified: I73.9

## 2024-03-12 HISTORY — PX: CYSTOSCOPY/URETEROSCOPY/HOLMIUM LASER/STENT PLACEMENT: SHX6546

## 2024-03-12 HISTORY — DX: Cardiac arrhythmia, unspecified: I49.9

## 2024-03-12 MED ORDER — PROPOFOL 10 MG/ML IV BOLUS
INTRAVENOUS | Status: DC | PRN
  Administered 2024-03-12: 30 mg via INTRAVENOUS
  Administered 2024-03-12: 50 mg via INTRAVENOUS

## 2024-03-12 MED ORDER — ONDANSETRON HCL 4 MG/2ML IJ SOLN
INTRAMUSCULAR | Status: AC
  Filled 2024-03-12: qty 2

## 2024-03-12 MED ORDER — ONDANSETRON HCL 4 MG/2ML IJ SOLN: 4.0000 mg | Freq: Once | INTRAMUSCULAR | Status: DC | PRN

## 2024-03-12 MED ORDER — FENTANYL CITRATE (PF) 100 MCG/2ML IJ SOLN
INTRAMUSCULAR | Status: DC | PRN
  Administered 2024-03-12 (×4): 25 ug via INTRAVENOUS

## 2024-03-12 MED ORDER — CHLORHEXIDINE GLUCONATE 0.12 % MT SOLN
OROMUCOSAL | Status: AC
  Filled 2024-03-12: qty 15

## 2024-03-12 MED ORDER — PHENYLEPHRINE 80 MCG/ML (10ML) SYRINGE FOR IV PUSH (FOR BLOOD PRESSURE SUPPORT)
PREFILLED_SYRINGE | INTRAVENOUS | Status: DC | PRN
  Administered 2024-03-12 (×3): 80 ug via INTRAVENOUS
  Administered 2024-03-12: 40 ug via INTRAVENOUS
  Administered 2024-03-12 (×2): 80 ug via INTRAVENOUS
  Administered 2024-03-12: 40 ug via INTRAVENOUS
  Administered 2024-03-12: 80 ug via INTRAVENOUS

## 2024-03-12 MED ORDER — FENTANYL CITRATE (PF) 100 MCG/2ML IJ SOLN: 25.0000 ug | INTRAMUSCULAR | Status: DC | PRN

## 2024-03-12 MED ORDER — EPHEDRINE SULFATE-NACL 50-0.9 MG/10ML-% IV SOSY
PREFILLED_SYRINGE | INTRAVENOUS | Status: DC | PRN
  Administered 2024-03-12 (×6): 5 mg via INTRAVENOUS

## 2024-03-12 MED ORDER — SODIUM CHLORIDE 0.9 % IR SOLN
Status: DC | PRN
  Administered 2024-03-12: 3000 mL

## 2024-03-12 MED ORDER — SODIUM CHLORIDE 0.9 % IV SOLN
2.0000 g | INTRAVENOUS | Status: AC
  Administered 2024-03-12: 2 g via INTRAVENOUS
  Filled 2024-03-12: qty 20

## 2024-03-12 MED ORDER — ACETAMINOPHEN 500 MG PO TABS
ORAL_TABLET | ORAL | Status: AC
  Filled 2024-03-12: qty 2

## 2024-03-12 MED ORDER — LIDOCAINE 2% (20 MG/ML) 5 ML SYRINGE
INTRAMUSCULAR | Status: AC
  Filled 2024-03-12: qty 5

## 2024-03-12 MED ORDER — FENTANYL CITRATE (PF) 100 MCG/2ML IJ SOLN
INTRAMUSCULAR | Status: AC
  Filled 2024-03-12: qty 2

## 2024-03-12 MED ORDER — ACETAMINOPHEN 500 MG PO TABS
1000.0000 mg | ORAL_TABLET | Freq: Once | ORAL | Status: AC
  Administered 2024-03-12: 1000 mg via ORAL

## 2024-03-12 MED ORDER — SODIUM CHLORIDE 0.9% FLUSH: 3.0000 mL | Freq: Two times a day (BID) | INTRAVENOUS | Status: DC

## 2024-03-12 MED ORDER — SODIUM CHLORIDE 0.9 % IV SOLN: INTRAVENOUS | Status: DC

## 2024-03-12 MED ORDER — LIDOCAINE 2% (20 MG/ML) 5 ML SYRINGE
INTRAMUSCULAR | Status: DC | PRN
  Administered 2024-03-12: 100 mg via INTRAVENOUS

## 2024-03-12 MED ORDER — PROPOFOL 10 MG/ML IV BOLUS
INTRAVENOUS | Status: AC
  Filled 2024-03-12: qty 20

## 2024-03-12 MED ORDER — LACTATED RINGERS IV SOLN: INTRAVENOUS | Status: DC

## 2024-03-12 MED ORDER — PHENYLEPHRINE 80 MCG/ML (10ML) SYRINGE FOR IV PUSH (FOR BLOOD PRESSURE SUPPORT)
PREFILLED_SYRINGE | INTRAVENOUS | Status: AC
  Filled 2024-03-12: qty 10

## 2024-03-12 MED ORDER — ORAL CARE MOUTH RINSE: 15.0000 mL | Freq: Once | OROMUCOSAL | Status: AC

## 2024-03-12 MED ORDER — CHLORHEXIDINE GLUCONATE 0.12 % MT SOLN
15.0000 mL | Freq: Once | OROMUCOSAL | Status: AC
  Administered 2024-03-12: 15 mL via OROMUCOSAL

## 2024-03-12 MED ORDER — ONDANSETRON HCL 4 MG/2ML IJ SOLN
INTRAMUSCULAR | Status: DC | PRN
  Administered 2024-03-12: 4 mg via INTRAVENOUS

## 2024-03-12 NOTE — Anesthesia Procedure Notes (Signed)
 Procedure Name: LMA Insertion Date/Time: 03/12/2024 7:40 AM  Performed by: Denton Niels CROME, CRNAPre-anesthesia Checklist: Patient identified, Emergency Drugs available, Suction available, Patient being monitored and Timeout performed Patient Re-evaluated:Patient Re-evaluated prior to induction Oxygen Delivery Method: Circle system utilized Preoxygenation: Pre-oxygenation with 100% oxygen Induction Type: IV induction Ventilation: Mask ventilation without difficulty LMA: LMA with gastric port inserted LMA Size: 4.0 Number of attempts: 1 Placement Confirmation: positive ETCO2 Dental Injury: Teeth and Oropharynx as per pre-operative assessment

## 2024-03-12 NOTE — Op Note (Signed)
 Procedure: 1.  Cystoscopy with removal of right double-J stent. 2.  Right ureteroscopy. 3.  Cystoscopy with removal of left double-J stent with ureteroscopy, holmium laser application and stone extraction. 4.  Application of fluoroscopy.  Pre-op diagnosis: 2 mm right proximal stone and 2 mm left mid renal stone with bilateral stents for left obstruction and sepsis.  Postop diagnosis: Interval passage of right proximal stone.  New organized clot in left kidney.  Surgeon: Dr. Norleen Seltzer.  Anesthesia: General.  Specimen: None.  Drains: None.  EBL: None.  Indications: Patient is a 89 year old male originally presented with left flank pain and sepsis.  He was found to have a 2 mm left mid ureteral stone with obstruction and a 2 mm right proximal stone without obstruction.  He underwent bilateral ureteral stenting and returns now for planned ureteroscopy.  Procedure: He was taken the operating room was given Rocephin .  A general anesthetic was induced.  He was placed in lithotomy position and fitted with PAS hose.  His perineum and genitalia were prepped Betadine solution was draped in usual sterile fashion.  Cystoscopy was performed using the 21 French scope and 30 degree lens.  Examination revealed a normal urethra.  The external sphincter was intact.  The prostatic urethra short with bilobar hyperplasia and a small middle lobe.  Examination of bladder revealed mild trabeculation without tumors or inflammation.  There was some dark flat stone like material in the bladder.  There were stent loops that each ureteral orifice.  The right stent was grasped and pulled the urethral meatus and a guidewire was passed to the kidney under fluoroscopic guidance.  The stent was removed.  The 6.5 French short semirigid scope was then passed alongside the wire to its proximal extent in near the UPJ and no obvious stone was seen.  I then passed a 28 cm 11/13 French digital access sheath over the wire and  removed the wire and inner core.  The single-lumen digital scope was then used to inspect the renal pelvis and collecting system stones were noted.  The sheath and scope were backed out while visually inspecting the ureter and no stones were identified.  The left stent was then pulled to the urethral meatus in a similar fashion with the cystoscope and grasping forceps and a sensor wire was passed to the kidney.  The 6.5 French short ureteroscope was then advanced alongside the wire and he was noted to have some tannish stones/organized clots in the distal ureter which were removed with an engage basket.  The rigid scope was advanced to its proximal extent which was about the mid ureter with no additional stones being seen.  The access sheath was then advanced over the wire and the inner core and wire were removed.  The single-lumen digital flexible scope was then advanced to the kidney and he was noted to have approximately 1 x 2 cm tannish structure suggestive of organized clot and I felt that this needed to be removed or it would be likely obstructing after removal of the stent.  I switched to the dual-lumen digital flexible scope and used the 200 m holmium laser fiber on the dusting setting on the laser and fragmented the organized clot in the manageable fragments which were then removed with the engage basket.  Multiple passes were made as this was a large structure but eventually I removed all but a little bit of 1 to 2 mm grit/residual clot that was felt to be small enough that it  should pass easily through his dilated previously stented ureter.  At this point the ureteroscope was backed out once again while visually inspecting the ureter and no additional stones or abnormalities were noted.  The cystoscope was then reinserted and the bladder was evacuated free of the organized clot that had been moved to the bladder and then the bladder was partially drained and the cystoscope was removed.  It was  not felt that restenting was necessary.  He was taken down from lithotomy position, his anesthetic was reversed and he was moved recovery room in stable condition.  I gave the removed material to the family so the patient could see what was there.  There were no complications.

## 2024-03-12 NOTE — Anesthesia Postprocedure Evaluation (Signed)
"   Anesthesia Post Note  Patient: Jerry [name]  Procedure(s) Performed: CYSTOSCOPY/URETEROSCOPY/HOLMIUM LASER/ STENT REMOVAL (Bilateral: Renal)     Patient location during evaluation: PACU Anesthesia Type: General Level of consciousness: awake and alert, oriented and patient cooperative Pain management: pain level controlled Vital Signs Assessment: post-procedure vital signs reviewed and stable Respiratory status: spontaneous breathing, nonlabored ventilation and respiratory function stable Cardiovascular status: blood pressure returned to baseline and stable Postop Assessment: no apparent nausea or vomiting Anesthetic complications: no   No notable events documented.  Last Vitals:  Vitals:   03/12/24 0930 03/12/24 0945  BP: (!) 122/54 (!) 127/52  Pulse: (!) 57 (!) 56  Resp: 17 (!) 9  Temp:  36.7 C  SpO2: 95% 96%    Last Pain:  Vitals:   03/12/24 0945  TempSrc:   PainSc: 0-No pain                 Almarie HERO Laquinta Hazell      "

## 2024-03-12 NOTE — Discharge Instructions (Signed)
 CYSTOSCOPY HOME CARE INSTRUCTIONS  Activity: Rest for the remainder of the day.  Do not drive or operate equipment today.  You may resume normal activities in one to two days as instructed by your physician.   Meals: Drink plenty of liquids and eat light foods such as gelatin or soup this evening.  You may return to a normal meal plan tomorrow.  Return to Work: You may return to work in one to two days or as instructed by your physician.  Special Instructions / Symptoms: Call your physician if any of these symptoms occur:   -persistent or heavy bleeding  -bleeding which continues after first few urination  -large blood clots that are difficult to pass  -urine stream diminishes or stops completely  -fever equal to or higher than 101 degrees Farenheit.  -cloudy urine with a strong, foul odor  -severe pain   Patient Signature:  ________________________________________________________  Nurse's Signature:  ________________________________________________________

## 2024-03-12 NOTE — Interval H&P Note (Signed)
 History and Physical Interval Note:no change  03/12/2024 7:15 AM  [name]  has presented today for surgery, with the diagnosis of BILATERAL URETERL STONES.  The various methods of treatment have been discussed with the patient and family. After consideration of risks, benefits and other options for treatment, the patient has consented to  Procedures: CYSTOSCOPY/URETEROSCOPY/HOLMIUM LASER/STENT PLACEMENT (Bilateral) as a surgical intervention.  The patient's history has been reviewed, patient examined, no change in status, stable for surgery.  I have reviewed the patient's chart and labs.  Questions were answered to the patient's satisfaction.     Jerry [name]

## 2024-03-12 NOTE — Transfer of Care (Signed)
 Immediate Anesthesia Transfer of Care Note  Patient: Jerry [name]  Procedure(s) Performed: CYSTOSCOPY/URETEROSCOPY/HOLMIUM LASER/ STENT REMOVAL (Bilateral: Renal)  Patient Location: PACU  Anesthesia Type:General  Level of Consciousness: drowsy and responds to stimulation  Airway & Oxygen Therapy: Patient Spontanous Breathing and Patient connected to face mask oxygen  Post-op Assessment: Report given to RN and Post -op Vital signs reviewed and stable  Post vital signs: Reviewed and stable  Last Vitals:  Vitals Value Taken Time  BP 130/51 03/12/24 09:09  Temp 36.7 C 03/12/24 09:09  Pulse 55 03/12/24 09:14  Resp 11 03/12/24 09:14  SpO2 95 % 03/12/24 09:14  Vitals shown include unfiled device data.  Last Pain:  Vitals:   03/12/24 0909  TempSrc:   PainSc: Asleep         Complications: No notable events documented.

## 2024-03-12 NOTE — Anesthesia Preprocedure Evaluation (Addendum)
 "                                  Anesthesia Evaluation  Patient identified by MRN, date of birth, ID band Patient awake    Reviewed: Allergy & Precautions, H&P , NPO status , Patient's Chart, lab work & pertinent test results, reviewed documented beta blocker date and time   Airway Mallampati: III  TM Distance: >3 FB Neck ROM: Full    Dental  (+) Teeth Intact, Dental Advisory Given   Pulmonary former smoker   Pulmonary exam normal breath sounds clear to auscultation       Cardiovascular hypertension (174/84 preop, per pt and family normally lower than this), Pt. on medications and Pt. on home beta blockers + Peripheral Vascular Disease (plavix  LD 5d ago) and +CHF (LVEF 35-40%, grade 1 diastolic dysfunction, mod RV failure)  + dysrhythmias Atrial Fibrillation + Valvular Problems/Murmurs (mild AS) AS  Rhythm:Regular Rate:Normal  Echo 01/20/24: 1. No left ventricular thrombus is seen (Definity  contrast was used).  Left ventricular ejection fraction, by estimation, is 35 to 40%. The left  ventricle has moderately decreased function. The left ventricle  demonstrates global hypokinesis. Left  ventricular diastolic parameters are consistent with Grade I diastolic  dysfunction (impaired relaxation).   2. Right ventricular systolic function is moderately reduced. The right  ventricular size is not well visualized. Tricuspid regurgitation signal is  inadequate for assessing PA pressure.   3. Left atrial size was mildly dilated.   4. The mitral valve is degenerative. No evidence of mitral valve  regurgitation. No evidence of mitral stenosis. Moderate mitral annular  calcification.   5. There appears to be low flow low gradient mild aortic stenosis. The  aortic valve is tricuspid. There is moderate calcification of the aortic  valve. There is moderate thickening of the aortic valve. Aortic valve  regurgitation is trivial. Mild aortic  valve stenosis.   6. The inferior vena  cava is dilated in size with <50% respiratory  variability, suggesting right atrial pressure of 15 mmHg.    Seen by cardiology during admission 11/28-12/8/25. Found to have AF RVR with no prior history of a fib. Echo 01/20/24 with EF 35-40%, G1DD, mildly dilated LA, moderate mitral annular calcification, low flow low gradient mild AS with peak gradient 8.5 mmHg. He had pyelonephritis with obstructive uropathy, E. coli bacteremia, and became septic and hypotensive following ureteral stent placement requiring pressor support and started on Lasix  40 mg IV for volume overload with good diuresis.  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   History of remote endovascular AAA repair and popliteal aneurysms. Has been on Plavix  since at least 2024 s/p stenting of popliteal aneurysm   Neuro/Psych Recent SDH 11/2023- treated conservatively (also R clavicle fx)  negative psych ROS   GI/Hepatic Neg liver ROS,GERD  Controlled and Medicated,,  Endo/Other  negative endocrine ROS    Renal/GU Renal disease (B/L ureteral stones)  negative genitourinary   Musculoskeletal  (+) Arthritis , Osteoarthritis,    Abdominal   Peds negative pediatric ROS (+)  Hematology negative hematology ROS (+)   Anesthesia Other Findings   Reproductive/Obstetrics negative OB ROS                              Anesthesia Physical Anesthesia Plan  ASA: 4  Anesthesia Plan:  General   Post-op Pain Management: Tylenol  PO (pre-op)*   Induction: Intravenous  PONV Risk Score and Plan: 2 and Ondansetron , Dexamethasone and Treatment may vary due to age or medical condition  Airway Management Planned: LMA  Additional Equipment: None  Intra-op Plan:   Post-operative Plan: Extubation in OR  Informed Consent: I have reviewed the patients History and Physical, chart, labs and discussed the procedure including the risks, benefits and alternatives for the  proposed anesthesia with the patient or authorized representative who has indicated his/her understanding and acceptance.     Dental advisory given  Plan Discussed with: CRNA  Anesthesia Plan Comments: (Pt adamant about not having ETT d/t sore throat after last cysto/stent (when he was septic in Nov 2025))         Anesthesia Quick Evaluation  "

## 2024-03-13 ENCOUNTER — Encounter (HOSPITAL_COMMUNITY): Payer: Self-pay | Admitting: Urology

## 2024-03-21 ENCOUNTER — Other Ambulatory Visit (HOSPITAL_COMMUNITY): Payer: Self-pay

## 2024-04-26 ENCOUNTER — Ambulatory Visit: Admitting: Nurse Practitioner
